# Patient Record
Sex: Female | Born: 1940
Health system: Southern US, Community
[De-identification: ages and names within clinical notes are randomized; demographics above are authoritative.]

## PROBLEM LIST (undated history)

## (undated) DIAGNOSIS — K449 Diaphragmatic hernia without obstruction or gangrene: Secondary | ICD-10-CM

## (undated) DIAGNOSIS — G459 Transient cerebral ischemic attack, unspecified: Secondary | ICD-10-CM

## (undated) DIAGNOSIS — J45909 Unspecified asthma, uncomplicated: Secondary | ICD-10-CM

## (undated) DIAGNOSIS — M8580 Other specified disorders of bone density and structure, unspecified site: Secondary | ICD-10-CM

## (undated) DIAGNOSIS — J449 Chronic obstructive pulmonary disease, unspecified: Secondary | ICD-10-CM

## (undated) DIAGNOSIS — I4891 Unspecified atrial fibrillation: Secondary | ICD-10-CM

## (undated) DIAGNOSIS — N393 Stress incontinence (female) (male): Secondary | ICD-10-CM

## (undated) DIAGNOSIS — K5790 Diverticulosis of intestine, part unspecified, without perforation or abscess without bleeding: Secondary | ICD-10-CM

## (undated) DIAGNOSIS — K2970 Gastritis, unspecified, without bleeding: Secondary | ICD-10-CM

## (undated) HISTORY — PX: TOTAL ABDOMINAL HYSTERECTOMY: SHX209

## (undated) HISTORY — DX: Diaphragmatic hernia without obstruction or gangrene: K44.9

## (undated) HISTORY — DX: Chronic obstructive pulmonary disease, unspecified: J44.9

## (undated) HISTORY — DX: Gastritis, unspecified, without bleeding: K29.70

## (undated) HISTORY — DX: Transient cerebral ischemic attack, unspecified: G45.9

## (undated) HISTORY — DX: Other specified disorders of bone density and structure, unspecified site: M85.80

## (undated) HISTORY — DX: Unspecified atrial fibrillation: I48.91

## (undated) HISTORY — DX: Diverticulosis of intestine, part unspecified, without perforation or abscess without bleeding: K57.90

## (undated) HISTORY — PX: CATARACT EXTRACTION: SUR2

## (undated) HISTORY — DX: Unspecified asthma, uncomplicated: J45.909

## (undated) HISTORY — PX: TONSILLECTOMY: SUR1361

## (undated) HISTORY — DX: Stress incontinence (female) (male): N39.3

---

## 1970-12-29 HISTORY — PX: BREAST CYST EXCISION: SHX579

## 1998-11-30 ENCOUNTER — Encounter: Payer: Self-pay | Admitting: Obstetrics and Gynecology

## 1998-11-30 ENCOUNTER — Ambulatory Visit (HOSPITAL_COMMUNITY): Admission: RE | Admit: 1998-11-30 | Discharge: 1998-11-30 | Payer: Self-pay | Admitting: Obstetrics and Gynecology

## 1998-12-13 ENCOUNTER — Ambulatory Visit (HOSPITAL_COMMUNITY): Admission: RE | Admit: 1998-12-13 | Discharge: 1998-12-13 | Payer: Self-pay | Admitting: Obstetrics and Gynecology

## 1998-12-13 ENCOUNTER — Encounter: Payer: Self-pay | Admitting: Obstetrics and Gynecology

## 1999-08-13 ENCOUNTER — Ambulatory Visit (HOSPITAL_COMMUNITY): Admission: RE | Admit: 1999-08-13 | Discharge: 1999-08-13 | Payer: Self-pay | Admitting: Obstetrics and Gynecology

## 1999-08-13 ENCOUNTER — Encounter: Payer: Self-pay | Admitting: Obstetrics and Gynecology

## 2000-06-15 ENCOUNTER — Encounter: Payer: Self-pay | Admitting: *Deleted

## 2000-06-15 ENCOUNTER — Inpatient Hospital Stay (HOSPITAL_COMMUNITY): Admission: EM | Admit: 2000-06-15 | Discharge: 2000-06-16 | Payer: Self-pay | Admitting: *Deleted

## 2000-06-16 ENCOUNTER — Ambulatory Visit (HOSPITAL_COMMUNITY): Admission: AD | Admit: 2000-06-16 | Discharge: 2000-06-16 | Payer: Self-pay | Admitting: Cardiology

## 2000-09-28 ENCOUNTER — Ambulatory Visit (HOSPITAL_COMMUNITY): Admission: RE | Admit: 2000-09-28 | Discharge: 2000-09-28 | Payer: Self-pay | Admitting: Obstetrics and Gynecology

## 2000-09-28 ENCOUNTER — Encounter: Payer: Self-pay | Admitting: Obstetrics and Gynecology

## 2001-03-10 ENCOUNTER — Encounter: Payer: Self-pay | Admitting: Gastroenterology

## 2001-03-10 ENCOUNTER — Ambulatory Visit (HOSPITAL_COMMUNITY): Admission: RE | Admit: 2001-03-10 | Discharge: 2001-03-10 | Payer: Self-pay | Admitting: Gastroenterology

## 2001-08-12 LAB — HM MAMMOGRAPHY: HM Mammogram: NORMAL

## 2002-01-03 ENCOUNTER — Encounter: Payer: Self-pay | Admitting: Obstetrics and Gynecology

## 2002-01-03 ENCOUNTER — Ambulatory Visit (HOSPITAL_COMMUNITY): Admission: RE | Admit: 2002-01-03 | Discharge: 2002-01-03 | Payer: Self-pay | Admitting: Obstetrics and Gynecology

## 2003-04-18 LAB — HM COLONOSCOPY

## 2003-08-30 HISTORY — PX: BLADDER REPAIR: SHX76

## 2003-08-31 ENCOUNTER — Encounter: Payer: Self-pay | Admitting: Obstetrics and Gynecology

## 2003-09-06 ENCOUNTER — Inpatient Hospital Stay (HOSPITAL_COMMUNITY): Admission: RE | Admit: 2003-09-06 | Discharge: 2003-09-08 | Payer: Self-pay | Admitting: Obstetrics and Gynecology

## 2003-09-06 ENCOUNTER — Encounter (INDEPENDENT_AMBULATORY_CARE_PROVIDER_SITE_OTHER): Payer: Self-pay | Admitting: Specialist

## 2003-10-17 ENCOUNTER — Ambulatory Visit (HOSPITAL_COMMUNITY): Admission: RE | Admit: 2003-10-17 | Discharge: 2003-10-17 | Payer: Self-pay | Admitting: Obstetrics and Gynecology

## 2003-10-17 ENCOUNTER — Encounter: Payer: Self-pay | Admitting: Obstetrics and Gynecology

## 2005-01-29 ENCOUNTER — Ambulatory Visit: Payer: Self-pay | Admitting: Family Medicine

## 2005-02-03 ENCOUNTER — Ambulatory Visit: Payer: Self-pay | Admitting: Family Medicine

## 2005-03-21 ENCOUNTER — Ambulatory Visit: Payer: Self-pay | Admitting: Family Medicine

## 2005-03-27 ENCOUNTER — Ambulatory Visit: Payer: Self-pay | Admitting: Family Medicine

## 2005-04-10 ENCOUNTER — Ambulatory Visit: Payer: Self-pay | Admitting: Family Medicine

## 2005-07-31 ENCOUNTER — Ambulatory Visit: Payer: Self-pay | Admitting: Family Medicine

## 2005-12-08 ENCOUNTER — Ambulatory Visit: Payer: Self-pay | Admitting: Family Medicine

## 2006-01-13 ENCOUNTER — Ambulatory Visit: Payer: Self-pay | Admitting: Family Medicine

## 2006-02-11 ENCOUNTER — Ambulatory Visit: Payer: Self-pay | Admitting: Family Medicine

## 2006-02-18 ENCOUNTER — Ambulatory Visit: Payer: Self-pay | Admitting: Family Medicine

## 2006-03-09 ENCOUNTER — Ambulatory Visit (HOSPITAL_COMMUNITY): Admission: RE | Admit: 2006-03-09 | Discharge: 2006-03-09 | Payer: Self-pay | Admitting: Obstetrics and Gynecology

## 2006-04-01 ENCOUNTER — Ambulatory Visit: Payer: Self-pay | Admitting: Gastroenterology

## 2006-04-09 ENCOUNTER — Ambulatory Visit: Payer: Self-pay | Admitting: Internal Medicine

## 2006-08-04 ENCOUNTER — Ambulatory Visit: Payer: Self-pay | Admitting: Family Medicine

## 2006-09-16 ENCOUNTER — Ambulatory Visit: Payer: Self-pay | Admitting: Family Medicine

## 2006-09-23 ENCOUNTER — Ambulatory Visit: Payer: Self-pay | Admitting: Family Medicine

## 2007-03-17 ENCOUNTER — Ambulatory Visit: Payer: Self-pay | Admitting: Family Medicine

## 2007-03-29 DIAGNOSIS — K5730 Diverticulosis of large intestine without perforation or abscess without bleeding: Secondary | ICD-10-CM | POA: Insufficient documentation

## 2007-03-29 DIAGNOSIS — J449 Chronic obstructive pulmonary disease, unspecified: Secondary | ICD-10-CM | POA: Insufficient documentation

## 2007-07-23 ENCOUNTER — Telehealth (INDEPENDENT_AMBULATORY_CARE_PROVIDER_SITE_OTHER): Payer: Self-pay | Admitting: Internal Medicine

## 2007-07-23 ENCOUNTER — Encounter: Payer: Self-pay | Admitting: Family Medicine

## 2007-07-26 ENCOUNTER — Telehealth (INDEPENDENT_AMBULATORY_CARE_PROVIDER_SITE_OTHER): Payer: Self-pay | Admitting: Internal Medicine

## 2007-07-26 ENCOUNTER — Ambulatory Visit: Payer: Self-pay | Admitting: Family Medicine

## 2007-07-26 DIAGNOSIS — M542 Cervicalgia: Secondary | ICD-10-CM | POA: Insufficient documentation

## 2007-07-26 LAB — CONVERTED CEMR LAB
Bilirubin Urine: NEGATIVE
Blood in Urine, dipstick: NEGATIVE
Glucose, Urine, Semiquant: NEGATIVE
Urobilinogen, UA: NEGATIVE

## 2007-08-02 ENCOUNTER — Ambulatory Visit: Payer: Self-pay | Admitting: Family Medicine

## 2007-08-02 DIAGNOSIS — N760 Acute vaginitis: Secondary | ICD-10-CM | POA: Insufficient documentation

## 2007-08-10 ENCOUNTER — Telehealth (INDEPENDENT_AMBULATORY_CARE_PROVIDER_SITE_OTHER): Payer: Self-pay | Admitting: *Deleted

## 2007-09-16 ENCOUNTER — Emergency Department (HOSPITAL_COMMUNITY): Admission: EM | Admit: 2007-09-16 | Discharge: 2007-09-16 | Payer: Self-pay | Admitting: Emergency Medicine

## 2007-09-17 ENCOUNTER — Inpatient Hospital Stay (HOSPITAL_COMMUNITY): Admission: AD | Admit: 2007-09-17 | Discharge: 2007-09-20 | Payer: Self-pay | Admitting: General Surgery

## 2007-09-17 ENCOUNTER — Encounter: Payer: Self-pay | Admitting: Emergency Medicine

## 2007-09-19 ENCOUNTER — Encounter (INDEPENDENT_AMBULATORY_CARE_PROVIDER_SITE_OTHER): Payer: Self-pay | Admitting: Internal Medicine

## 2007-09-29 DIAGNOSIS — M8580 Other specified disorders of bone density and structure, unspecified site: Secondary | ICD-10-CM | POA: Insufficient documentation

## 2007-10-04 ENCOUNTER — Ambulatory Visit: Payer: Self-pay | Admitting: Family Medicine

## 2007-10-04 DIAGNOSIS — S2249XA Multiple fractures of ribs, unspecified side, initial encounter for closed fracture: Secondary | ICD-10-CM | POA: Insufficient documentation

## 2007-10-06 ENCOUNTER — Telehealth (INDEPENDENT_AMBULATORY_CARE_PROVIDER_SITE_OTHER): Payer: Self-pay | Admitting: *Deleted

## 2007-10-07 ENCOUNTER — Encounter (INDEPENDENT_AMBULATORY_CARE_PROVIDER_SITE_OTHER): Payer: Self-pay | Admitting: Internal Medicine

## 2007-10-14 ENCOUNTER — Telehealth (INDEPENDENT_AMBULATORY_CARE_PROVIDER_SITE_OTHER): Payer: Self-pay | Admitting: Internal Medicine

## 2007-10-20 ENCOUNTER — Encounter (INDEPENDENT_AMBULATORY_CARE_PROVIDER_SITE_OTHER): Payer: Self-pay | Admitting: Internal Medicine

## 2007-11-01 ENCOUNTER — Telehealth (INDEPENDENT_AMBULATORY_CARE_PROVIDER_SITE_OTHER): Payer: Self-pay | Admitting: Internal Medicine

## 2008-02-02 ENCOUNTER — Ambulatory Visit: Payer: Self-pay | Admitting: Family Medicine

## 2008-02-02 DIAGNOSIS — R062 Wheezing: Secondary | ICD-10-CM | POA: Insufficient documentation

## 2008-02-10 ENCOUNTER — Telehealth (INDEPENDENT_AMBULATORY_CARE_PROVIDER_SITE_OTHER): Payer: Self-pay | Admitting: Internal Medicine

## 2008-02-10 ENCOUNTER — Ambulatory Visit: Payer: Self-pay | Admitting: Family Medicine

## 2008-02-10 ENCOUNTER — Encounter (INDEPENDENT_AMBULATORY_CARE_PROVIDER_SITE_OTHER): Payer: Self-pay | Admitting: Internal Medicine

## 2008-02-24 ENCOUNTER — Ambulatory Visit: Payer: Self-pay | Admitting: Family Medicine

## 2008-02-24 DIAGNOSIS — K219 Gastro-esophageal reflux disease without esophagitis: Secondary | ICD-10-CM | POA: Insufficient documentation

## 2008-02-25 LAB — CONVERTED CEMR LAB
Basophils Absolute: 0 10*3/uL (ref 0.0–0.1)
Basophils Relative: 0.3 % (ref 0.0–1.0)
Eosinophils Relative: 3.2 % (ref 0.0–5.0)
Hemoglobin: 12.5 g/dL (ref 12.0–15.0)
Lymphocytes Relative: 30.8 % (ref 12.0–46.0)
MCHC: 32.5 g/dL (ref 30.0–36.0)
MCV: 93.9 fL (ref 78.0–100.0)
Monocytes Absolute: 0.9 10*3/uL — ABNORMAL HIGH (ref 0.2–0.7)
Neutrophils Relative %: 56.3 % (ref 43.0–77.0)
RBC: 4.09 M/uL (ref 3.87–5.11)
WBC: 9.2 10*3/uL (ref 4.5–10.5)

## 2008-03-08 ENCOUNTER — Ambulatory Visit: Payer: Self-pay | Admitting: Family Medicine

## 2008-03-08 ENCOUNTER — Telehealth (INDEPENDENT_AMBULATORY_CARE_PROVIDER_SITE_OTHER): Payer: Self-pay | Admitting: Internal Medicine

## 2008-03-08 DIAGNOSIS — N390 Urinary tract infection, site not specified: Secondary | ICD-10-CM | POA: Insufficient documentation

## 2008-03-08 LAB — CONVERTED CEMR LAB
Glucose, Urine, Semiquant: NEGATIVE
pH: 6.5

## 2008-03-09 ENCOUNTER — Encounter (INDEPENDENT_AMBULATORY_CARE_PROVIDER_SITE_OTHER): Payer: Self-pay | Admitting: Internal Medicine

## 2008-03-10 ENCOUNTER — Telehealth: Payer: Self-pay | Admitting: Family Medicine

## 2008-03-21 ENCOUNTER — Ambulatory Visit: Payer: Self-pay | Admitting: Family Medicine

## 2008-04-14 ENCOUNTER — Ambulatory Visit: Payer: Self-pay | Admitting: Family Medicine

## 2008-04-14 DIAGNOSIS — J019 Acute sinusitis, unspecified: Secondary | ICD-10-CM | POA: Insufficient documentation

## 2008-05-30 ENCOUNTER — Ambulatory Visit: Payer: Self-pay | Admitting: Family Medicine

## 2008-05-30 DIAGNOSIS — K2970 Gastritis, unspecified, without bleeding: Secondary | ICD-10-CM | POA: Insufficient documentation

## 2008-05-30 DIAGNOSIS — K2990 Gastroduodenitis, unspecified, without bleeding: Secondary | ICD-10-CM

## 2008-10-02 ENCOUNTER — Telehealth (INDEPENDENT_AMBULATORY_CARE_PROVIDER_SITE_OTHER): Payer: Self-pay | Admitting: *Deleted

## 2008-10-11 ENCOUNTER — Ambulatory Visit: Payer: Self-pay | Admitting: Family Medicine

## 2008-10-11 DIAGNOSIS — N393 Stress incontinence (female) (male): Secondary | ICD-10-CM | POA: Insufficient documentation

## 2008-10-11 LAB — CONVERTED CEMR LAB
Bacteria, UA: 0
Nitrite: NEGATIVE
Specific Gravity, Urine: 1.01
Urobilinogen, UA: 0.2
WBC Urine, dipstick: NEGATIVE
pH: 7

## 2008-10-16 ENCOUNTER — Telehealth (INDEPENDENT_AMBULATORY_CARE_PROVIDER_SITE_OTHER): Payer: Self-pay | Admitting: Internal Medicine

## 2008-10-17 ENCOUNTER — Ambulatory Visit: Payer: Self-pay | Admitting: Family Medicine

## 2008-12-19 ENCOUNTER — Ambulatory Visit: Payer: Self-pay | Admitting: Family Medicine

## 2008-12-28 ENCOUNTER — Ambulatory Visit: Payer: Self-pay | Admitting: Family Medicine

## 2008-12-28 DIAGNOSIS — R42 Dizziness and giddiness: Secondary | ICD-10-CM | POA: Insufficient documentation

## 2009-01-26 ENCOUNTER — Ambulatory Visit: Payer: Self-pay | Admitting: Family Medicine

## 2009-01-30 ENCOUNTER — Ambulatory Visit: Payer: Self-pay | Admitting: Family Medicine

## 2009-03-06 ENCOUNTER — Ambulatory Visit: Payer: Self-pay | Admitting: Family Medicine

## 2009-05-01 ENCOUNTER — Ambulatory Visit: Payer: Self-pay | Admitting: Family Medicine

## 2009-05-04 ENCOUNTER — Ambulatory Visit: Payer: Self-pay | Admitting: Family Medicine

## 2009-05-31 ENCOUNTER — Encounter (INDEPENDENT_AMBULATORY_CARE_PROVIDER_SITE_OTHER): Payer: Self-pay | Admitting: Internal Medicine

## 2009-05-31 ENCOUNTER — Ambulatory Visit: Payer: Self-pay | Admitting: Family Medicine

## 2009-05-31 LAB — CONVERTED CEMR LAB: ALT: 16 units/L (ref 0–35)

## 2009-06-01 LAB — CONVERTED CEMR LAB
AST: 26 units/L (ref 0–37)
BUN: 18 mg/dL (ref 6–23)
Calcium: 9.1 mg/dL (ref 8.4–10.5)
Cholesterol: 200 mg/dL (ref 0–200)
Creatinine, Ser: 0.8 mg/dL (ref 0.4–1.2)
GFR calc non Af Amer: 75.63 mL/min (ref >60)
Glucose, Bld: 106 mg/dL — ABNORMAL HIGH (ref 70–99)
Potassium: 4.1 meq/L (ref 3.5–5.1)
TSH: 0.72 microintl units/mL (ref 0.35–5.50)
VLDL: 31 mg/dL (ref 0.0–40.0)

## 2009-06-21 ENCOUNTER — Ambulatory Visit: Payer: Self-pay | Admitting: Family Medicine

## 2009-07-13 ENCOUNTER — Telehealth (INDEPENDENT_AMBULATORY_CARE_PROVIDER_SITE_OTHER): Payer: Self-pay | Admitting: Internal Medicine

## 2009-07-30 ENCOUNTER — Telehealth: Payer: Self-pay | Admitting: Family Medicine

## 2009-08-23 ENCOUNTER — Ambulatory Visit: Payer: Self-pay | Admitting: Family Medicine

## 2009-08-23 DIAGNOSIS — H6120 Impacted cerumen, unspecified ear: Secondary | ICD-10-CM | POA: Insufficient documentation

## 2009-08-24 ENCOUNTER — Telehealth (INDEPENDENT_AMBULATORY_CARE_PROVIDER_SITE_OTHER): Payer: Self-pay | Admitting: Internal Medicine

## 2009-09-24 ENCOUNTER — Telehealth: Payer: Self-pay | Admitting: Family Medicine

## 2009-10-15 ENCOUNTER — Ambulatory Visit: Payer: Self-pay | Admitting: Family Medicine

## 2009-10-15 DIAGNOSIS — J209 Acute bronchitis, unspecified: Secondary | ICD-10-CM | POA: Insufficient documentation

## 2009-12-03 ENCOUNTER — Telehealth: Payer: Self-pay | Admitting: Family Medicine

## 2009-12-03 ENCOUNTER — Ambulatory Visit: Payer: Self-pay | Admitting: Family Medicine

## 2009-12-03 DIAGNOSIS — R7309 Other abnormal glucose: Secondary | ICD-10-CM | POA: Insufficient documentation

## 2009-12-04 LAB — CONVERTED CEMR LAB: Glucose, Bld: 99 mg/dL (ref 70–99)

## 2010-01-21 ENCOUNTER — Telehealth: Payer: Self-pay | Admitting: Family Medicine

## 2010-05-22 ENCOUNTER — Encounter: Payer: Self-pay | Admitting: Family Medicine

## 2010-05-22 LAB — CONVERTED CEMR LAB
ALT: 19 units/L
BUN: 17 mg/dL
Creatinine, Ser: 0.65 mg/dL
Ferritin: 8 ng/mL
Glucose, Bld: 109 mg/dL
Hemoglobin: 11 g/dL
Hgb A1c MFr Bld: 6.2 %
MCHC: 31.9 g/dL
RBC: 4.09 M/uL
RDW: 14.3 %
TIBC: 361 ug/dL
TSH: 0.681 microintl units/mL
Triglycerides: 74 mg/dL
UIBC: 290 ug/dL
Vit D, 25-Hydroxy: 30.7 ng/mL
Vitamin B-12: 287 pg/mL

## 2011-01-30 NOTE — Progress Notes (Addendum)
Summary: advair  Phone Note Refill Request Message from:  Fax from Pharmacy on January 21, 2010 9:18 AM  Refills Requested: Medication #1:  advair diskus 250/50   Supply Requested: 1 month [facility] [phone] not on medication list   Method Requested: Electronic Initial call taken by: Benny Lennert CMA (AAMA),  January 21, 2010 9:18 AM    New/Updated Medications: ADVAIR DISKUS 100-50 MCG/DOSE AEPB (FLUTICASONE-SALMETEROL) one inhal two times a day Prescriptions: ADVAIR DISKUS 100-50 MCG/DOSE AEPB (FLUTICASONE-SALMETEROL) one inhal two times a day  #1 MDI x 1   Entered and Authorized by:   Shaune Leeks MD   Signed by:   Shaune Leeks MD on 01/21/2010   Method used:   Electronically to        [facility]* (retail)       [address]       [place], [place]  [address]       Ph: [phone]       Fax: [phone]   RxID:   [id number]

## 2011-02-14 ENCOUNTER — Ambulatory Visit (INDEPENDENT_AMBULATORY_CARE_PROVIDER_SITE_OTHER): Payer: Medicare Other | Admitting: Family Medicine

## 2011-02-14 ENCOUNTER — Encounter: Payer: Self-pay | Admitting: Family Medicine

## 2011-02-14 DIAGNOSIS — R059 Cough, unspecified: Secondary | ICD-10-CM

## 2011-02-14 DIAGNOSIS — R05 Cough: Secondary | ICD-10-CM

## 2011-02-14 DIAGNOSIS — R739 Hyperglycemia, unspecified: Secondary | ICD-10-CM | POA: Insufficient documentation

## 2011-02-17 ENCOUNTER — Encounter: Payer: Self-pay | Admitting: Family Medicine

## 2011-02-19 NOTE — Assessment & Plan Note (Signed)
Summary: COLD/CLE   BCBS   Vitals Entered By: Delilah Shan CMA Valen Gillison Dull) (February 14, 2011 9:46 AM)

## 2011-02-19 NOTE — Assessment & Plan Note (Addendum)
Summary: Cold   Vital Signs:  Patient profile:   70 year old female Height:      64 inches Weight:      140.25 pounds BMI:     24.16 Temp:     97.6 degrees F oral Pulse rate:   84 / minute Pulse rhythm:   regular BP sitting:   118 / 80  (left arm) Cuff size:   regular  Vitals Entered By: Delilah Shan CMA Duncan Dull) (February 14, 2011 9:51 AM) CC: Cold   History of Present Illness: Cold symptoms.  Going on for a few days.  Head congestion.  some cough.  H/o COPD.  Cough is increased.  On advair and xopenex for breakthrough.  Has needed SABA more this week.  No FCNAV.  Some tightness in chest with cough.  Occ sputum after inhaler use, some yellow sputum.  Wheezing more than usual over the last few days.   Allergies: No Known Drug Allergies  Past History:  Past Medical History: Pap/Gyn care per Dr. Elana Alm Diabetes mellitus, type II  Social History: Marital Status: divorced Children: 2childresn, 1 grandchild Occupation: prev worked at [employer] quit smoking 20+ years  Review of Systems       See HPI.  Otherwise negative.    Physical Exam  General:  no apparent distress normocephalic atraumatic mucous membranes moist tm wnl nasal epithelium injected.  op w/o erythema neck supple no la regular rate and rhythm occ scattered ronchi but no wheeze, no focal decrease in bs, no increase in wob ext well perfused.    Impression & Recommendations:  Problem # 1:  COUGH (ICD-786.2) start zmax and continue inhalers.  I didn't start steroids given h/o DM2 and lack of wheeze.  If short of breath or progressive symptoms, the she needs to go to ER.  Nontoxic and okay for outpatient follow up.  she agrees.   Orders: Prescription Created Electronically [phone])  Complete Medication List: 1)  Nexium 40 Mg Cpdr (Esomeprazole magnesium) .... Take 2  tablet by mouth once a day 2)  Mobic 15 Mg Tabs (Meloxicam) .... Take one by mouth one time a day 3)  Xopenex Hfa 45 Mcg/act  Aero (Levalbuterol tartrate) .... 2 puffs every 4 hours as needed 4)  Advair Diskus 250-50 Mcg/dose Aepb (Fluticasone-salmeterol) .... One inhalation two times a day 5)  Zithromax 250 Mg Tabs (Azithromycin) .... 2 by mouth today and then 1 by mouth once daily for 4 days. 6)  Tramadol Hcl 50 Mg Tabs (Tramadol hcl) .... Take 1 tablet every 4 to 6 hours as needed. 7)  Metformin Hcl 500 Mg Tabs (Metformin hcl) .... Take 1 tablet by mouth two times a day 8)  Metoprolol Succinate 25 Mg Xr24h-tab (Metoprolol succinate) .... Take 1 tablet by mouth once a day 9)  Vitamin D 1000 Unit Tabs (Cholecalciferol) .... Take 1 tablet by mouth two times a day  Patient Instructions: 1)  Start the antibiotics today and keep using your inhalers.  If you get more short of breath, then go to the ER.  2)  OV in 1-2 months with A1c before OV.  250.00 3)  Take care.  Prescriptions: ZITHROMAX 250 MG TABS (AZITHROMYCIN) 2 by mouth today and then 1 by mouth once daily for 4 days.  #6 x 0   Entered and Authorized by:   Crawford Givens MD   Signed by:   Crawford Givens MD on 02/14/2011   Method used:   Electronically to  [facility]* (retail)       [address]       [place], [place]  [address]       Ph: [phone]       Fax: [phone]   RxID:   [id number]    Orders Added: 1)  Est. Patient Level III [84132] 2)  Prescription Created Electronically [phone]    Current Allergies (reviewed today): No known allergies

## 2011-02-20 ENCOUNTER — Telehealth: Payer: Self-pay | Admitting: Family Medicine

## 2011-02-25 ENCOUNTER — Encounter: Payer: Self-pay | Admitting: Family Medicine

## 2011-02-25 NOTE — Progress Notes (Addendum)
Summary: still has cough, congestion  Phone Note Call from Dominique Clark   Caller: Dominique Clark Call For: Dr. Para March Summary of Call: Pt was seen on 2/17.  She finished z-pack but still has a lot of coughing and tightness in her chest.  She says she usually requires 2 rounds of antibiotics before she is well.  No fever, but says she never runs a fever.  Drinking plenty of fluids, taking mucinix twice a day.  [facility]. Please let her know if something is called in. Initial call taken by: Lowella Petties CMA, AAMA,  February 20, 2011 3:06 PM  Follow-up for Phone Call        I sent in the refill but have the Dominique Clark hold off on taking it for 2-3 more days to see if she improved. If not, then take it.  It is likely that she still has some of the zmax working, since it stays in the body for several days.  She may improve on her own.  Follow-up by: Crawford Givens MD,  February 20, 2011 3:21 PM  Additional Follow-up for Phone Call Additional follow up Details #1::        Dominique Clark Advised.  Additional Follow-up by: Delilah Shan CMA Kester Stimpson Dull),  February 20, 2011 3:31 PM    Prescriptions: ZITHROMAX 250 MG TABS (AZITHROMYCIN) 2 by mouth today and then 1 by mouth once daily for 4 days.  #6 x 0   Entered and Authorized by:   Crawford Givens MD   Signed by:   Crawford Givens MD on 02/20/2011   Method used:   Electronically to        [facility]* (retail)       [address]       [place], [place]  [address]       Ph: [phone]       Fax: [phone]   RxID:   [id number]

## 2011-02-26 ENCOUNTER — Encounter: Payer: Self-pay | Admitting: Family Medicine

## 2011-03-06 NOTE — Miscellaneous (Signed)
  Clinical Lists Changes  Observations: Added new observation of VIT D 25-OH: 30.7 ng/mL (05/22/2010 8:15) Added new observation of PLATELETK/UL: 369 K/uL (05/22/2010 8:15) Added new observation of RDW: 14.3 % (05/22/2010 8:15) Added new observation of MCHC RBC: 31.9 g/dL (24/40/1027 2:53) Added new observation of MCV: 84 fL (05/22/2010 8:15) Added new observation of HCT: 34.5 % (05/22/2010 8:15) Added new observation of HGB: 11.0 g/dL (66/44/0347 4:25) Added new observation of RBC M/UL: 4.09 M/uL (05/22/2010 8:15) Added new observation of WBC COUNT: 6.9 10*3/microliter (05/22/2010 8:15) Added new observation of TSH: 0.681 microintl units/mL (05/22/2010 8:15) Added new observation of HGBA1C: 6.2 % (05/22/2010 8:15) Added new observation of FERRITIN: 8 ng/mL (05/22/2010 8:15) Added new observation of IRON SATUR %: 20 % (05/22/2010 8:15) Added new observation of TIBC: 361 mcg/dL (95/63/8756 4:33) Added new observation of UIBC: 290 mcg/dL (29/51/8841 6:60) Added new observation of IRON: 71 mcg/dL (63/12/6008 9:32) Added new observation of FOLATE: >19.9 ng/mL (05/22/2010 8:15) Added new observation of B12: 287 pg/mL (05/22/2010 8:15) Added new observation of SGPT (ALT): 19 units/L (05/22/2010 8:15) Added new observation of SGOT (AST): 26 units/L (05/22/2010 8:15) Added new observation of CREATININE: 0.65 mg/dL (35/57/3220 2:54) Added new observation of BUN: 17 mg/dL (27/05/2375 2:83) Added new observation of BG RANDOM: 109 mg/dL (15/17/6160 7:37) Added new observation of LDL: 102 mg/dL (10/62/6948 5:46) Added new observation of HDL: 56 mg/dL (27/02/5008 3:81) Added new observation of TRIGLYC TOT: 74 mg/dL (82/99/3716 9:67) Added new observation of CHOLESTEROL: 173 mg/dL (89/38/1017 5:10)

## 2011-03-11 NOTE — Letter (Signed)
Summary: [facility]   Imported By: Kassie Mends 03/03/2011 11:27:50  _____________________________________________________________________  External Attachment:    Type:   Image     Comment:   External Document

## 2011-04-09 ENCOUNTER — Other Ambulatory Visit: Payer: Self-pay | Admitting: Family Medicine

## 2011-04-09 DIAGNOSIS — D649 Anemia, unspecified: Secondary | ICD-10-CM

## 2011-04-14 ENCOUNTER — Other Ambulatory Visit (INDEPENDENT_AMBULATORY_CARE_PROVIDER_SITE_OTHER): Payer: Medicare Other | Admitting: Family Medicine

## 2011-04-14 DIAGNOSIS — E119 Type 2 diabetes mellitus without complications: Secondary | ICD-10-CM

## 2011-04-14 DIAGNOSIS — D649 Anemia, unspecified: Secondary | ICD-10-CM

## 2011-04-14 LAB — CBC WITH DIFFERENTIAL/PLATELET
Basophils Relative: 0.6 % (ref 0.0–3.0)
Eosinophils Relative: 3.2 % (ref 0.0–5.0)
Monocytes Relative: 8.3 % (ref 3.0–12.0)
Neutrophils Relative %: 58 % (ref 43.0–77.0)
Platelets: 224 10*3/uL (ref 150.0–400.0)
RBC: 3.87 Mil/uL (ref 3.87–5.11)
WBC: 8.4 10*3/uL (ref 4.5–10.5)

## 2011-04-14 LAB — HEMOGLOBIN A1C: Hgb A1c MFr Bld: 5.9 % (ref 4.6–6.5)

## 2011-04-14 LAB — FERRITIN: Ferritin: 10.5 ng/mL (ref 10.0–291.0)

## 2011-04-16 ENCOUNTER — Ambulatory Visit: Payer: Medicare Other | Admitting: Family Medicine

## 2011-04-17 ENCOUNTER — Encounter: Payer: Self-pay | Admitting: Family Medicine

## 2011-04-17 ENCOUNTER — Ambulatory Visit (INDEPENDENT_AMBULATORY_CARE_PROVIDER_SITE_OTHER): Payer: Medicare Other | Admitting: Family Medicine

## 2011-04-17 DIAGNOSIS — D649 Anemia, unspecified: Secondary | ICD-10-CM

## 2011-04-17 DIAGNOSIS — M949 Disorder of cartilage, unspecified: Secondary | ICD-10-CM

## 2011-04-17 DIAGNOSIS — J449 Chronic obstructive pulmonary disease, unspecified: Secondary | ICD-10-CM

## 2011-04-17 DIAGNOSIS — E119 Type 2 diabetes mellitus without complications: Secondary | ICD-10-CM

## 2011-04-17 DIAGNOSIS — M8580 Other specified disorders of bone density and structure, unspecified site: Secondary | ICD-10-CM

## 2011-04-17 DIAGNOSIS — M899 Disorder of bone, unspecified: Secondary | ICD-10-CM

## 2011-04-17 DIAGNOSIS — J4489 Other specified chronic obstructive pulmonary disease: Secondary | ICD-10-CM

## 2011-04-17 MED ORDER — AZITHROMYCIN 250 MG PO TABS: ORAL_TABLET | ORAL | Status: AC

## 2011-04-17 NOTE — Progress Notes (Signed)
1 week of cough with sputum.  She is getting some better, slowly.  She is not on mucinex now. It had helped before.  Advair bid, using xopenex more often in the last week.  Some wheeze.  No fevers.    H/o mild anemia.  Resolved now. Prev with negative colonoscopy. Will check IFOB.  D/w pt CZ:YSAYT CA screening and she declines colonoscopy referral.   Diabetes:  Using medications without difficulties:yes Hypoglycemic episodes:no Hyperglycemic episodes:no Feet problems:no Blood Sugars averaging:~100  PMH and SH reviewed  Meds, vitals, and allergies reviewed.   ROS: See HPI.  Otherwise negative.    GEN: nad, alert and oriented HEENT: mucous membranes moist NECK: supple w/o LA CV: rrr. PULM: dry cough but no inc wob noted.  scatterred ronchi bilaterally ABD: soft, +bs EXT: no edema SKIN: no acute rash  Diabetic foot exam: Normal inspection except for prominent veins No skin breakdown No calluses  Normal DP pulses Normal sensation to light tough and monofilament Nails thickened

## 2011-04-17 NOTE — Patient Instructions (Signed)
Start the antibiotics today and keep using the inhalers and mucinex.  Come back for fasting labs in 6 months with a physical a few days later.  Return the stool kit in the meantime.  Take care.

## 2011-04-17 NOTE — Assessment & Plan Note (Addendum)
No change in meds.  Check in 6 months. D/w pt ZO:XWRU, meds, weight, exercise.

## 2011-04-17 NOTE — Assessment & Plan Note (Addendum)
Exacerbation,  Start abx, use inhalers, and fu prn. Nontoxic and okay for outpatient fu.

## 2011-04-17 NOTE — Assessment & Plan Note (Addendum)
resolved, check IFOB and recheck cbc in 6 months.  Prev with negative colonoscopy and prev EGD done.

## 2011-04-22 ENCOUNTER — Other Ambulatory Visit (INDEPENDENT_AMBULATORY_CARE_PROVIDER_SITE_OTHER): Payer: Medicare Other | Admitting: Family Medicine

## 2011-04-22 ENCOUNTER — Other Ambulatory Visit: Payer: Medicare Other

## 2011-04-22 DIAGNOSIS — Z1211 Encounter for screening for malignant neoplasm of colon: Secondary | ICD-10-CM

## 2011-04-22 LAB — FECAL OCCULT BLOOD, IMMUNOCHEMICAL: Fecal Occult Bld: NEGATIVE

## 2011-04-23 ENCOUNTER — Encounter: Payer: Self-pay | Admitting: *Deleted

## 2011-05-02 ENCOUNTER — Ambulatory Visit (INDEPENDENT_AMBULATORY_CARE_PROVIDER_SITE_OTHER): Payer: Medicare Other | Admitting: Family Medicine

## 2011-05-02 ENCOUNTER — Encounter: Payer: Self-pay | Admitting: Family Medicine

## 2011-05-02 VITALS — BP 128/70 | HR 92 | Temp 98.1°F | Wt 142.0 lb

## 2011-05-02 DIAGNOSIS — H8110 Benign paroxysmal vertigo, unspecified ear: Secondary | ICD-10-CM

## 2011-05-02 MED ORDER — MECLIZINE HCL 25 MG PO TABS: 12.5000 mg | ORAL_TABLET | Freq: Three times a day (TID) | ORAL | Status: DC | PRN

## 2011-05-02 NOTE — Assessment & Plan Note (Addendum)
Use prn meclizine and fu prn.  Benign exam o/w.  D/w pt.  Given her hx, exam, no indication for further wu. She understood.

## 2011-05-02 NOTE — Patient Instructions (Signed)
Take the meclizine 3 times a day as needed and let me know if you have more symptoms.  Take care.

## 2011-05-02 NOTE — Progress Notes (Signed)
"  Inner ear."  The "bed was moving this AM" when she got out of bed.  The room was spinning as she was trying to sit up.  Was nauseated and sweaty.  She has had it before but not recently.  This episode lasted about seconds to minutes.  It self resolved and she went back to bed.  She rolled over and it happened again.  She had used meclizine before with some relief.  She feels better now.    Meds, vitals, and allergies reviewed.   ROS: See HPI.  Otherwise, noncontributory.  GEN: nad, alert and oriented HEENT: mucous membranes moist NECK: supple w/o LA CV: rrr.  PULM: ctab, no inc wob ABD: soft, +bs EXT: no edema SKIN: no acute rash CN 2-12 wnl B, S/S/DTR wnl x4 DHP positive.

## 2011-05-04 ENCOUNTER — Encounter: Payer: Self-pay | Admitting: Family Medicine

## 2011-05-13 NOTE — H&P (Signed)
[name], [name]                ACCOUNT NO.:  [id number]   MEDICAL RECORD NO.:  [id number]          PATIENT TYPE:  INP   LOCATION:  5733                         FACILITY:  [facility]   PHYSICIAN:  Thornton Park. Daphine Deutscher, MD  DATE OF BIRTH:  10-Apr-1941   DATE OF ADMISSION:  09/17/2007  DATE OF DISCHARGE:                              HISTORY & PHYSICAL   CHIEF COMPLAINT:  Dominique Clark on the right side by breaking multiple ribs, with  underlying COPD.   HISTORY:  Dominique Clark is an active 70 year old white female who was  referred over by [facility] D. Bean, FNP at [facility] this  afternoon, after she was seen in the [facility] emergency room last  night following a fall.  Apparently she was in coded into [facility] ER  as a trauma patient, but was diverted to [facility].  She was seen by  Dr. Ethelda Chick, who recommended admission; but the patient did not want  to be admitted to [facility].  She comes in today having  difficulty controlling her pain with Percocet.   ALLERGIES:  THE PATIENT HAS NO KNOWN ALLERGIES.   PAST MEDICAL HISTORY:  1. She has COPD.  2. Hiatal hernia, with occasional gastroesophageal reflux disease.   CURRENT MEDICATIONS:  1. Nexium 40 mg daily.  2. Estrace 1 mg per day.  3. Mobic 25 mg one a day.  4. Percocet at home.   PRIOR SURGERY:  1. Some form of vaginal urethral sling done by Drs. McPhail and      Dahlstead in 2004.  2. Prior hysterectomy.   She currently has some right shoulder bursitis.   SOCIAL HISTORY:  Smoking history.  She smoked for about 20 years, but  quit about 15 years ago.   REVIEW OF SYSTEMS:  Negative for any kind of angina or cardiac problems;  and she said she has had a cardiac catheterization which was negative.   PHYSICAL EXAM:  An older white female whose splinting and is very  uncomfortable.  Her pulse rate was about 68.  HEENT:  Head normocephalic.  Eyes: Sclerae nonicteric.  Pupils equal,  round and react to light.   Nose and Throat: Exam unremarkable.  NECK:  Supple.  CHEST:  She has some diminished breath sounds terms diminished  respiratory excursions on the right, but breath sounds bilaterally.  She  is tender underneath her right breast, but no bruising is noted there.  ABDOMEN:  Nontender.  EXTREMITIES:  Have full range of motion, except some soreness in her  right shoulder.   RADIOLOGY:  X-rays show minimally displaced right rib fractures, without  obvious complication.  There was some chronic COPD.   IMPRESSION:  Fall to the right side, with no loss of consciousness; but  with rib fractures in an elderly patient with underlying COPD.   PLAN:  Admit for pain control.  Repeat x-rays.      Thornton Park Daphine Deutscher, MD  Electronically Signed     MBM/MEDQ  D:  09/17/2007  T:  09/18/2007  Job:  [id number]

## 2011-05-13 NOTE — Discharge Summary (Signed)
NAMEELLICIA, Dominique Clark                ACCOUNT NO.:  [id number]   MEDICAL RECORD NO.:  [id number]          PATIENT TYPE:  INP   LOCATION:  5733                         FACILITY:  [facility]   PHYSICIAN:  Gabrielle Dare. Janee Morn, M.D.DATE OF BIRTH:  1941/10/09   DATE OF ADMISSION:  09/17/2007  DATE OF DISCHARGE:  09/19/2007                               DISCHARGE SUMMARY   PRIMARY CARE Bani Gianfrancesco:  Adult nurse at [facility].   DISCHARGE DIAGNOSES:  1. Status post fall at home.  2. Right rib fractures. without pneumothorax.  3. Chronic obstructive pulmonary disease.  4. Exacerbation of chronic bronchitis symptoms.  5. Right shoulder bursitis.  6. Hiatal hernia.  7. Gastroesophageal reflux disease.   BRIEF HISTORY ON ADMISSION:  This is a 70 year old white female who was  referred to the trauma service after suffering a fall at home a couple  of days prior to her presentation to her primary care [name].  She was  seen in the [facility] Emergency Room, and workup at this time showed  several minimally displaced right rib fractures, #3, 4, 5, and 6,  without pneumothorax.  The patient had a significant history of  underlying COPD as well, and was having a great deal of difficulty with  pain control immobilization, and was admitted to the trauma service at  [facility].   HOSPITAL COURSE:  The patient has done well with mobilization.  She has  had some nausea and vomiting felt to be related to her pain medications,  and this is improved on the morning of discharge.  She did have a sputum  culture obtained, and this culture is pending at the time of this  dictation.  However, she was started on p.o. antibiotics for empiric  coverage.  At this time, it is felt she can be discharged home with her  son.  She is tolerating a regular diet.   MEDICATIONS AT THE TIME OF DISCHARGE:  1. Percocet 5/325, 1-2 p.o. q.4 h. p.r.n. pain, #60, no refill.  2. Augmentin 500 mg t.i.d. x5 days, to start on September 20, 2007.      She was treated with 2 days of Avelox while hospitalized, but this      was changed due to cost concerns.  3. Robitussin 2 tsp q.i.d. x5 days.  4. Spiriva 1 puff daily.  5. Xopenex 2 puffs q.i.d.  6. Estrace 1 mg daily.  7. Mobic 15 mg daily.  8. Nexium 40 mg daily.  9. Quinine 1 tablet daily.   She can follow up with Section at [facility] in 1 week, or sooner  should she have any difficulties in the interim, or follow up with  trauma services should she so desire.      Shawn Rayburn, P.A.      Gabrielle Dare Janee Morn, M.D.  Electronically Signed    SR/MEDQ  D:  09/19/2007  T:  09/20/2007  Job:  [id number]   cc:   Corinda Gubler at [facility]

## 2011-05-16 NOTE — H&P (Signed)
NAME:  Dominique Clark, DUPLECHAIN                          ACCOUNT NO.:  [id number]   MEDICAL RECORD NO.:  [id number]                   PATIENT TYPE:  INP   LOCATION:  NA                                   [facility]ACILITY[facility]   PHYSICIAN:  Katherine Roan, M.D.               DATE OF BIRTH:  [birth year]   DATE OF ADMISSION:  DATE OF DISCHARGE:                                HISTORY & PHYSICAL   CHIEF COMPLAINT:  Pelvic pressure and feeling that things are falling out.   HISTORY OF PRESENT ILLNESS:  Ms. House is a 70 year old gravida 1, para 1  female with twins who complains of her bladder falling down, pelvic  pressure, and feeling that things are falling out.  On examination the  patient has large rectocele and enterocele and probable cystocele as well.  The patient has minimal incontinence but we have suggested she have a sling  when she has her pelvic reconstruction.  Her Pap smears are normal.   CURRENT MEDICATIONS:  1. Nexium.  2. Clarinex.  3. Estrace 1 mg.  4. Mobic.  5. Advair.  6. Aspirin.   PAST MEDICAL HISTORY:  1. She has had a past history of breast cyst.  2. She has had some form of bladder surgery.  3. She had a vaginal hysterectomy in 1978.  4. Laparoscopy BSO in 1992.  Not quite sure she had laparoscopic BSO.  At     the time we thought she had a pelvic mass but it turned out that she had     follicular cysts on the ovary with a hydrosalpinx.   REVIEW OF SYSTEMS:  HEENT:  She does not wear glasses, but has noted no  decrease in visual or auditory acuity.  HEART:  No hypertension.  No chest  pain.  No shortness of breath.  No history of heart murmur.  LUNGS:  No  chronic cough.  She has asthma which is treated with Advair.  GENITOURINARY:  She has no stress incontinence or at least minimal stress incontinence.  She  complains of pelvic pressure.  GASTROINTESTINAL:  She has no bowel habit  change, no melena.  She has been treated for a hiatal hernia with her  Nexium.   SOCIAL HISTORY:  She works at [employer].  She does not smoke or drink.   FAMILY HISTORY:  Her mother is deceased at age 40 from congestive heart  failure.  Her father died at age 66.  She has four brothers living and well  and one sister who is deceased.  This sister died at birth.  She has a  maternal aunt with diabetes.   PHYSICAL EXAMINATION:  GENERAL:  Well-developed, pleasant female who is 70  years of age.  Appears to be her stated age.  Is oriented to time, place,  and recent events.  HEENT:  Unremarkable.  The oropharynx is not injected.  NECK:  Supple.  Carotid pulses are equal.  I hear no bruits.  LUNGS:  Clear to P&A.  BREASTS:  No masses or tenderness.  HEART:  Normal sinus rhythm.  No murmurs.  ABDOMEN:  Soft and flat.  Liver, spleen, and kidneys are not palpated.  Bowel sounds are normal.  No bruits heard.  PELVIC:  Prolapse of the anterior segment as well as an enterocele which  protrudes at the introitus with coughing.  She also has a moderate amount of  posterior relaxation.  No pelvic masses are noted.  RECTAL:  Hemoccult is negative.  EXTREMITIES:  Good range of motion.  Equal pulses and reflexes.   IMPRESSION:  Genital prolapse with cystocele, rectocele, and enterocele.   PLAN:  Pelvic reconstruction.  Failure rates and risks have been discussed  with patient.                                               Katherine Roan, M.D.    SDM/MEDQ  D:  09/01/2003  T:  09/01/2003  Job:  [id number]

## 2011-05-16 NOTE — Op Note (Signed)
   NAME:  Dominique Clark, Dominique Clark                          ACCOUNT NO.:  [id number]   MEDICAL RECORD NO.:  [id number]                   PATIENT TYPE:  INP   LOCATION:  A540                                 [facility]ACILITY:  [facility]   PHYSICIAN:  Katherine Roan, M.D.               DATE OF BIRTH:  1941-02-03   DATE OF PROCEDURE:  DATE OF DISCHARGE:                                 OPERATIVE REPORT   PREOPERATIVE DIAGNOSIS:  Genital prolapse.   POSTOPERATIVE DIAGNOSIS:  Genital prolapse.   OPERATION:  Pelvic reconstruction, anterior repair, enterocele repair,  posterior repair and SPARC sling.   DESCRIPTION OF PROCEDURE:  The patient was placed in lithotomy position,  prepped and draped in the usual fashion. She was examined prior to prep and  drape, found to have no pelvic masses. The rectum exam was normal. It was  not as stenotic as I thought. There was moderate relaxation throughout.  Prepped and draped in the usual fashion, Foley catheter was inserted. The  apex of the vagina was grasped and at that point, the enterocele sac was  dissected, the pelvic cavity was entered. The enterocele was then repaired  using interrupted sutures of 3-0 and 4-0 Vicryl and also a pursestring  suture of 3-0 Ethibond. We then approached the cystocele and dissected it  from the underlying vaginal mucosa, plicated the pubocervical vaginal fascia  in the midline. Dr. Retta Diones then did a SPARC sling. We went in and  dissected, excised the excessive vaginal tissue, closed it with interrupted  sutures of 3-0 Vicryl and a locking suture of 3-0 chromic. Posteriorly we  dissected the rectocele and then plicated the prerectal fascia in the  midline with interrupted sutures of 3 and 4-0 Vicryl. The vagina was then  closed with a running locking suture of 3-0 chromic and interrupted sutures  of 3-0 Vicryl. The bulbocavernosus muscle and transverse perinei muscle were  then plicated in the midline with interrupted sutures of 3 and  4-0 Vicryl.  Perineal skin was closed with a subcuticular 3-0 chromic. The Foley was  draining clear urine. The patient tolerated the procedure well and was sent  to the recovery room in good condition.                                               Katherine Roan, M.D.    SDM/MEDQ  D:  09/06/2003  T:  09/07/2003  Job:  [id number]   cc:   Bertram Millard. Dahlstedt, M.D.  [address]  [place]  [place] [address]  Fax: [phone]

## 2011-05-16 NOTE — Op Note (Signed)
NAME:  Dominique Clark, Dominique Clark                          ACCOUNT NO.:  [id number]   MEDICAL RECORD NO.:  [id number]                   PATIENT TYPE:  INP   LOCATION:  0477                                 FACILITY:  [facility]   PHYSICIAN:  Bertram Millard. Dahlstedt, M.D.          DATE OF BIRTH:  02-Jan-1941   DATE OF PROCEDURE:  09/06/2003  DATE OF DISCHARGE:                                 OPERATIVE REPORT   PREOPERATIVE DIAGNOSIS:  Stress urinary incontinence.   POSTOPERATIVE DIAGNOSIS:  Stress urinary incontinence.   PRINCIPAL PROCEDURE:  SPARC procedure.   SURGEON:  Bertram Millard. Dahlstedt, M.D.   ANESTHESIA:  General endotracheal.   COMPLICATIONS:  None.   ESTIMATED BLOOD LOSS:  50 cc.   BRIEF HISTORY:  This 70 year old female presented to me last month with a  history of urinary incontinence.  Dr. Katherine Roan is her gynecologist,  and sent her in for evaluation of possible surgical management of this  incontinence.  She is scheduled for an enterocele and rectocele repair.  Evaluation in the office revealed normal bladder capacity, no involuntary  contractions and normal urodynamic findings.  She did have incontinence with  Valsalva. It was recommended that she undergo a procedure to place sling  material underneath her bladder neck.  We talked about the [facility] procedure  versus ________.  She presents at this time for [facility] procedure.  She is  aware of the risks and complications and desires to proceed.   DESCRIPTION OF PROCEDURE:  The patient was administered a general anesthetic  and underwent enterocele repair by Dr. Elana Alm.  Following this, the [facility]  procedure commenced.  He had already opened up the anterior vaginal fascia.   I constructed two transverse incisions just superior to the pubic bone  bilaterally.  With the bladder emptied, the [facility] needles were passed down  through these incisions bilaterally and along the posterior aspect of the  pubic bone, through the vaginal  fascia lateral to the urethra bilaterally.  Following adequate placement of these needles, cystoscopic view of the  urethra and bladder revealed no evidence of invasion of the bladder from  these needles.  The [facility] sling was then snapped on the end of these  needles, and the needles brought up anteriorly.  In this manner, the sling  was placed underneath the mid/proximal urethra.  The Foley balloon had been  over-inflated to identify the bladder neck.  There was a mild amount of  slack underneath the nylon mesh, between that and the urethra.  This was  left in that manner.  The  covering of the [facility] mesh was then removed.  The  mesh was cut underneath the skin  edges.  Inspection of the urethra and the mesh revealed still a good amount  of slack underneath the urethra.  The skin edges were reapproximated using  Dermabond.  Dr. Elana Alm then commenced with his rectocele repair.   The  patient tolerated the procedure well.  The catheter was left indwelling.                                               Bertram Millard. Dahlstedt, M.D.    SMD/MEDQ  D:  09/06/2003  T:  09/06/2003  Job:  [id number]

## 2011-05-16 NOTE — Discharge Summary (Signed)
   NAME:  Dominique Clark, Dominique Clark                          ACCOUNT NO.:  [id number]   MEDICAL RECORD NO.:  [id number]                   PATIENT TYPE:  INP   LOCATION:  0477                                 FACILITY:  [facility]   PHYSICIAN:  Katherine Roan, M.D.               DATE OF BIRTH:  04-Dec-1941   DATE OF ADMISSION:  09/06/2003  DATE OF DISCHARGE:  09/08/2003                                 DISCHARGE SUMMARY   ADMISSION DIAGNOSES:  1. Vaginal prolapse.  2. Stress urinary incontinence.   DISCHARGE DIAGNOSES:  1. Vaginal prolapse.  2. Stress urinary incontinence.   OPERATION PERFORMED:  1. SPARC retropubic sling.  2. Anterior/posterior repair.  3. Enterocele repair.   BRIEF HISTORY:  [name] is a 70 year old female with complete genital  prolapse status post hysterectomy for repair.  She had minimal stress  incontinence but it was felt that a sling would be of benefit to her.  She  was evaluated by Bertram Millard. Dahlstedt, M.D.   Her comorbidities include GERD.  She is on hormone replacement therapy.  She  had a past history of breast cysts.  She had some form of bladder surgery in  the past.  In 1992 she had laparoscopic BSO and hydrosalpinx.   LABORATORIES:  Admission hemoglobin was 13, hematocrit 40.  Cardiogram was  within normal limits.  Chest x-ray revealed no active disease, a healed left  sixth rib fracture.   HOSPITAL COURSE:  The patient was admitted to the hospital.  Underwent an  uneventful SPARC sling by Bertram Millard. Dahlstedt, M.D., pelvic examination  under anesthesia, anterior/posterior repair, and enterocele repair.  Her  postoperative course was uncomplicated.  Foley catheter was removed and she  did very well.  She was  discharged on the 10th of September voiding satisfactorily.  Asked to call  for fever, bleeding, and to return to the office in two weeks.   CONDITION ON DISCHARGE:  Improved.                                               Katherine Roan,  M.D.    SDM/MEDQ  D:  09/20/2003  T:  09/20/2003  Job:  [id number]   cc:   Bertram Millard. Dahlstedt, M.D.  [address]  [place]  [place] [address]  Fax: [phone]

## 2011-05-16 NOTE — Cardiovascular Report (Signed)
[place]  Patient:    Dominique Clark, Dominique Clark                         MRN: [id number] Proc. Date: 06/16/00 Adm. Date:  [id number] Disc. Date: [id number] Attending:  Veneda Melter                        Cardiac Catheterization  PROCEDURES PERFORMED: 1. Left heart catheterization. 2. Selective coronary angiography. 3. Left ventriculogram. 4. Perclose suture closure, right femoral artery.  DIAGNOSES: 1. Mild coronary artery disease by angiogram. 2. Normal left ventricular systolic function.  HISTORY:  Ms. Comes is a pleasant 70 year old white female with multiple cardiac risk factors who has recently experienced chest discomfort, fatigue, and dyspnea on exertion.  The patient underwent a dipyridamole Cardiolite study on June 03, 2000, which showed attenuation in the inferior and anterior walls, consistent with diaphragmatic and breast artifact; however, there did appear to be a small defect in the anterior apex.  She presents now for left heart catheterization.  DESCRIPTION OF PROCEDURE:  After informed consent was obtained, the patient was brought to the catheterization lab where both groins were sterilely prepped and draped.  Lidocaine, 1%, was used to infiltrate the right groin.  A 6-French sheath was placed in the right femoral artery using the modified Seldinger technique.  The 6-French JL4 and JR4 catheters were then used to engage the left and right coronary arteries, and selective angiography was recorded in various projections using manual injections of contrast.  A 6-French pigtail catheter was then advanced to the left ventricle, and a left ventriculogram was performed using power injections of contrast.  At the termination of the case, a Perclose suture closure device was deployed to the right femoral artery until adequate hemostasis was achieved.  The patient tolerated the procedure well and was transferred to the floor in stable condition.   Findings are as follows:  FINDINGS: 1. Left main trunk:  Large caliber vessel, mild irregularities. 2. LAD:  This is a medium caliber vessel that provides two diagonal branches.    The LAD has mild irregularities, 10-15%, in the proximal segment. 3. Left circumflex artery:  Large caliber vessel that consists of a large    first marginal branch in the mid section and further trifurcates distally.    The left circumflex system has mild irregularities of 10-20% in the    proximal segment. 4. The right coronary artery is dominant.  This is a small caliber vessel that    provides a small posterior descending artery and posterior ventricular    branch at the terminal segment.  There is a mild narrowing of 20% in the    proximal segment of the RCA.  LV:  Normal end systolic and end diastolic dimensions.  Overall left ventricular function is well preserved.  Ejection fraction is greater than 65%.  There is no mitral regurgitation.  LV pressure is 143/0.  Aortic is 143/71.  LVEDP equals 14.  ASSESSMENT AND PLAN:  Ms. Wojdyla is a 70 year old white female with trivial coronary artery disease by angiogram.  The patient also has had a false positive stress test.  At this point, her coronary disease will be medically managed, and other causes of chest discomfort will be investigated. DD:  06/16/00 TD:  06/17/00 Job: [id number]

## 2011-05-17 ENCOUNTER — Emergency Department (HOSPITAL_COMMUNITY)
Admission: EM | Admit: 2011-05-17 | Discharge: 2011-05-17 | Disposition: A | Discharge status: Home / Self Care | Payer: Medicare Other | Attending: Emergency Medicine | Admitting: Emergency Medicine

## 2011-05-17 DIAGNOSIS — J4489 Other specified chronic obstructive pulmonary disease: Secondary | ICD-10-CM | POA: Insufficient documentation

## 2011-05-17 DIAGNOSIS — Z23 Encounter for immunization: Secondary | ICD-10-CM | POA: Insufficient documentation

## 2011-05-17 DIAGNOSIS — K219 Gastro-esophageal reflux disease without esophagitis: Secondary | ICD-10-CM | POA: Insufficient documentation

## 2011-05-17 DIAGNOSIS — J449 Chronic obstructive pulmonary disease, unspecified: Secondary | ICD-10-CM | POA: Insufficient documentation

## 2011-05-17 DIAGNOSIS — Z203 Contact with and (suspected) exposure to rabies: Secondary | ICD-10-CM | POA: Insufficient documentation

## 2011-05-17 DIAGNOSIS — Z79899 Other long term (current) drug therapy: Secondary | ICD-10-CM | POA: Insufficient documentation

## 2011-05-20 ENCOUNTER — Inpatient Hospital Stay (INDEPENDENT_AMBULATORY_CARE_PROVIDER_SITE_OTHER)
Admission: RE | Admit: 2011-05-20 | Discharge: 2011-05-20 | Disposition: A | Discharge status: Home / Self Care | Payer: Medicare Other | Source: Ambulatory Visit | Attending: Family Medicine | Admitting: Family Medicine

## 2011-05-20 DIAGNOSIS — Z23 Encounter for immunization: Secondary | ICD-10-CM

## 2011-05-24 ENCOUNTER — Inpatient Hospital Stay (INDEPENDENT_AMBULATORY_CARE_PROVIDER_SITE_OTHER)
Admission: RE | Admit: 2011-05-24 | Discharge: 2011-05-24 | Disposition: A | Discharge status: Home / Self Care | Payer: Medicare Other | Source: Ambulatory Visit | Attending: Emergency Medicine | Admitting: Emergency Medicine

## 2011-05-24 DIAGNOSIS — Z23 Encounter for immunization: Secondary | ICD-10-CM

## 2011-05-31 ENCOUNTER — Inpatient Hospital Stay (INDEPENDENT_AMBULATORY_CARE_PROVIDER_SITE_OTHER)
Admission: RE | Admit: 2011-05-31 | Discharge: 2011-05-31 | Disposition: A | Discharge status: Home / Self Care | Payer: Medicare Other | Source: Ambulatory Visit

## 2011-05-31 DIAGNOSIS — Z23 Encounter for immunization: Secondary | ICD-10-CM

## 2011-09-19 ENCOUNTER — Ambulatory Visit (INDEPENDENT_AMBULATORY_CARE_PROVIDER_SITE_OTHER): Payer: Medicare Other

## 2011-09-19 ENCOUNTER — Inpatient Hospital Stay (INDEPENDENT_AMBULATORY_CARE_PROVIDER_SITE_OTHER)
Admission: RE | Admit: 2011-09-19 | Discharge: 2011-09-19 | Disposition: A | Discharge status: Home / Self Care | Payer: Self-pay | Source: Ambulatory Visit | Attending: Family Medicine | Admitting: Family Medicine

## 2011-09-19 DIAGNOSIS — S20219A Contusion of unspecified front wall of thorax, initial encounter: Secondary | ICD-10-CM

## 2011-10-02 ENCOUNTER — Encounter: Payer: Self-pay | Admitting: Family Medicine

## 2011-10-02 ENCOUNTER — Ambulatory Visit (INDEPENDENT_AMBULATORY_CARE_PROVIDER_SITE_OTHER): Payer: Medicare Other | Admitting: Family Medicine

## 2011-10-02 DIAGNOSIS — R0789 Other chest pain: Secondary | ICD-10-CM | POA: Insufficient documentation

## 2011-10-02 DIAGNOSIS — R071 Chest pain on breathing: Secondary | ICD-10-CM

## 2011-10-02 MED ORDER — METHOCARBAMOL 500 MG PO TABS: 1000.0000 mg | ORAL_TABLET | Freq: Four times a day (QID) | ORAL | Status: DC
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
## 2011-10-02 NOTE — Assessment & Plan Note (Signed)
Benign exam, she has made some improvement.  Use pillow for chest wall reinforcement and robaxin prn.  She needs to take deep breaths for effective pulmonary toilet and this was discussed.  F/u prn.  She will likely make continued but slow progress.  No need to reimage today.

## 2011-10-02 NOTE — Patient Instructions (Signed)
Get a pillow and press it to your chest when you cough.  The pain should gradually get better.   Schedule a physical for this fall or winter.

## 2011-10-09 LAB — BASIC METABOLIC PANEL
BUN: 10
CO2: 30
Calcium: 9.1
Calcium: 9.4
Creatinine, Ser: 0.59
GFR calc Af Amer: >60
GFR calc non Af Amer: >60
GFR calc non Af Amer: >60
Glucose, Bld: 108 — ABNORMAL HIGH
Potassium: 4
Sodium: 141
Sodium: 141

## 2011-10-09 LAB — EXPECTORATED SPUTUM ASSESSMENT W GRAM STAIN, RFLX TO RESP C

## 2011-10-09 LAB — CBC
HCT: 36.2
Hemoglobin: 11.8 — ABNORMAL LOW
Hemoglobin: 11.9 — ABNORMAL LOW
MCHC: 32.4
MCHC: 32.7
Platelets: 229
RBC: 3.84 — ABNORMAL LOW
RDW: 13.1
WBC: 10.8 — ABNORMAL HIGH

## 2011-10-09 LAB — CULTURE, RESPIRATORY W GRAM STAIN: Culture: NORMAL

## 2011-12-11 ENCOUNTER — Ambulatory Visit (INDEPENDENT_AMBULATORY_CARE_PROVIDER_SITE_OTHER): Payer: Medicare Other | Admitting: Family Medicine

## 2011-12-11 ENCOUNTER — Encounter: Payer: Self-pay | Admitting: Family Medicine

## 2011-12-11 DIAGNOSIS — J449 Chronic obstructive pulmonary disease, unspecified: Secondary | ICD-10-CM

## 2011-12-11 DIAGNOSIS — J4489 Other specified chronic obstructive pulmonary disease: Secondary | ICD-10-CM

## 2011-12-11 MED ORDER — PREDNISONE 20 MG PO TABS: ORAL_TABLET | ORAL | Status: DC

## 2011-12-11 MED ORDER — AZITHROMYCIN 250 MG PO TABS: ORAL_TABLET | ORAL | Status: DC

## 2011-12-11 NOTE — Progress Notes (Signed)
COPD flare Inc in sx over last few days Sputum production increased from baseline: now more than normal Increased cough:yes Increased SOB:yes Using O2:no Coughing up discolored sputum.  Wheezing more than normal. Needs refill on advair.   xopenex helps with sx.    Sugar was ~120 or lower recently.  Last a1c was controlled.   Meds, vitals, and allergies reviewed.   ROS: See HPI.  Otherwise negative.    GEN: nad, alert and oriented HEENT: mucous membranes moist NECK: supple w/o LA CV: rrr. PULM: no focal dec in BS but exp wheeze noted and coarse BS noted, no inc wob ABD: soft, +bs EXT: no edema SKIN: no acute rash

## 2011-12-11 NOTE — Patient Instructions (Signed)
Start the antibiotics and prednisone today.  Take the prednisone with food.  If you aren't getting better, let us know.  Take care. Keep using your inhalers.  See Aram Beecham on the way out too see if you can get help with the advair 250/50.

## 2011-12-12 ENCOUNTER — Encounter: Payer: Self-pay | Admitting: Family Medicine

## 2011-12-12 NOTE — Assessment & Plan Note (Signed)
Copd flare.  Try to limit oral steroid dose due to DM2.  D/w pt.  Start abx, 4 samples of advair given.  Use inhalers and then f/u prn.  She agrees.  Nontoxic.  Okay for outpatient f/u.

## 2011-12-19 ENCOUNTER — Telehealth: Payer: Self-pay | Admitting: Internal Medicine

## 2011-12-19 MED ORDER — AZITHROMYCIN 250 MG PO TABS: ORAL_TABLET | ORAL | Status: AC

## 2011-12-19 NOTE — Telephone Encounter (Signed)
Patient was seen on December 13th for COPD for flare and she has taken all of the antibiotic Z-pac.  She states it usually takes 2 rounds of antibiotic for her to get well.  She is still wheezing and coughing up yellow mucus and would like to have another Rx for Z-pak.

## 2011-12-19 NOTE — Telephone Encounter (Signed)
F/u if worsening. rx sent.

## 2011-12-19 NOTE — Telephone Encounter (Signed)
Spoke with patient and advised results   

## 2012-01-08 ENCOUNTER — Encounter: Payer: Self-pay | Admitting: Family Medicine

## 2012-01-08 ENCOUNTER — Ambulatory Visit (INDEPENDENT_AMBULATORY_CARE_PROVIDER_SITE_OTHER): Payer: Medicare Other | Admitting: Family Medicine

## 2012-01-08 DIAGNOSIS — R071 Chest pain on breathing: Secondary | ICD-10-CM

## 2012-01-08 DIAGNOSIS — R0789 Other chest pain: Secondary | ICD-10-CM

## 2012-01-08 MED ORDER — TRAMADOL HCL 50 MG PO TABS: 50.0000 mg | ORAL_TABLET | Freq: Four times a day (QID) | ORAL | Status: DC | PRN
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
## 2012-01-08 NOTE — Patient Instructions (Addendum)
I would take tramadol.  Take tylenol (2 regular strength, 3 times a day).  No heavy lifting.  Take it easy.  If the pain doesn't get better or if you have more trouble swallowing, then let me know.   Schedule a diabetes check in 2 months, visit.  Labs ahead of time.

## 2012-01-11 ENCOUNTER — Encounter: Payer: Self-pay | Admitting: Family Medicine

## 2012-01-11 NOTE — Assessment & Plan Note (Addendum)
Likely aggravated by recent activity.  Reassuring exam, unlikely to be cardiac.  Tylenol and tramadol in meantime.   The episode of dysphagia with the pills was isolated and likely due to hiatal hernia, ie unrelated to the above.  Will call back if sx continue with dysphagia.

## 2012-01-27 ENCOUNTER — Encounter: Payer: Self-pay | Admitting: Family Medicine

## 2012-01-27 ENCOUNTER — Ambulatory Visit (INDEPENDENT_AMBULATORY_CARE_PROVIDER_SITE_OTHER)
Admission: RE | Admit: 2012-01-27 | Discharge: 2012-01-27 | Disposition: A | Discharge status: Home / Self Care | Payer: Medicare Other | Source: Ambulatory Visit | Attending: Family Medicine | Admitting: Family Medicine

## 2012-01-27 ENCOUNTER — Ambulatory Visit (INDEPENDENT_AMBULATORY_CARE_PROVIDER_SITE_OTHER): Payer: Medicare Other | Admitting: Family Medicine

## 2012-01-27 VITALS — BP 142/80 | HR 88 | Temp 98.1°F | Wt 144.0 lb

## 2012-01-27 DIAGNOSIS — R0789 Other chest pain: Secondary | ICD-10-CM

## 2012-01-27 DIAGNOSIS — R071 Chest pain on breathing: Secondary | ICD-10-CM

## 2012-01-27 NOTE — Progress Notes (Signed)
Monday she had feeling like something was in the middle of chest, just to the right of midline, this is the same pain as prev.  She was coughing and sneezing.  The pain got worse but is better today.  She took tylenol for pain prev with some relief.  She didn't have dysphagia, swallowing was good.  She can have positional pain, pain with pressure/palpation of chest wall.  Prev notes and imaging reviewed.  Meds, vitals, and allergies reviewed.   ROS: See HPI.  Otherwise, noncontributory. nad  ncat  Mmm  Neck supple  rrr  ctab  Chest wall w/o bruising but ttp on R side of anterior/inferior chest, just lateral to inferior sternum.  No skin changes.  abd benign

## 2012-01-27 NOTE — Patient Instructions (Addendum)
I would take tylenol as needed and I expect this to gradually get better.  Take care.

## 2012-01-29 ENCOUNTER — Encounter: Payer: Self-pay | Admitting: Family Medicine

## 2012-01-29 NOTE — Assessment & Plan Note (Signed)
>  25 min spent with face to face with patient, >50% counseling and/or coordinating care.  cxr reviewed.  No sig pathology. Pt reassured. Should gradually resolved.  She has likely re-aggravated the irritation.  Okay for outpatient f/u.  She understood.

## 2012-02-17 ENCOUNTER — Other Ambulatory Visit (INDEPENDENT_AMBULATORY_CARE_PROVIDER_SITE_OTHER): Payer: Medicare Other

## 2012-02-17 DIAGNOSIS — E119 Type 2 diabetes mellitus without complications: Secondary | ICD-10-CM

## 2012-02-17 DIAGNOSIS — M8580 Other specified disorders of bone density and structure, unspecified site: Secondary | ICD-10-CM

## 2012-02-17 DIAGNOSIS — D649 Anemia, unspecified: Secondary | ICD-10-CM

## 2012-02-17 LAB — COMPREHENSIVE METABOLIC PANEL
Albumin: 4 g/dL (ref 3.5–5.2)
Alkaline Phosphatase: 50 U/L (ref 39–117)
BUN: 13 mg/dL (ref 6–23)
CO2: 27 mEq/L (ref 19–32)
Calcium: 9.7 mg/dL (ref 8.4–10.5)
Chloride: 105 mEq/L (ref 96–112)
Glucose, Bld: 93 mg/dL (ref 70–99)
Potassium: 4.7 mEq/L (ref 3.5–5.1)
Sodium: 141 mEq/L (ref 135–145)
Total Protein: 6.8 g/dL (ref 6.0–8.3)

## 2012-02-17 LAB — HEMOGLOBIN A1C: Hgb A1c MFr Bld: 6.1 % (ref 4.6–6.5)

## 2012-02-17 LAB — CBC WITH DIFFERENTIAL/PLATELET
Eosinophils Absolute: 0.3 10*3/uL (ref 0.0–0.7)
Eosinophils Relative: 4.8 % (ref 0.0–5.0)
Lymphocytes Relative: 35.1 % (ref 12.0–46.0)
MCV: 90.2 fl (ref 78.0–100.0)
Monocytes Absolute: 0.7 10*3/uL (ref 0.1–1.0)
Neutrophils Relative %: 49.6 % (ref 43.0–77.0)
Platelets: 252 10*3/uL (ref 150.0–400.0)
WBC: 7 10*3/uL (ref 4.5–10.5)

## 2012-02-17 LAB — LIPID PANEL
Cholesterol: 186 mg/dL (ref 0–200)
HDL: 56.5 mg/dL (ref >39.00)
LDL Cholesterol: 110 mg/dL — ABNORMAL HIGH (ref 0–99)
Total CHOL/HDL Ratio: 3
Triglycerides: 96 mg/dL (ref 0.0–149.0)
VLDL: 19.2 mg/dL (ref 0.0–40.0)

## 2012-02-19 ENCOUNTER — Other Ambulatory Visit: Payer: Self-pay | Admitting: Family Medicine

## 2012-02-19 DIAGNOSIS — E119 Type 2 diabetes mellitus without complications: Secondary | ICD-10-CM

## 2012-02-19 LAB — VITAMIN D 1,25 DIHYDROXY
Vitamin D 1, 25 (OH)2 Total: 64 pg/mL (ref 18–72)
Vitamin D2 1, 25 (OH)2: <8 pg/mL
Vitamin D3 1, 25 (OH)2: 64 pg/mL

## 2012-02-24 ENCOUNTER — Telehealth: Payer: Self-pay | Admitting: *Deleted

## 2012-02-24 NOTE — Telephone Encounter (Signed)
Patient says she needs a mammogram and called to schedule it.  They asked her if she was having an trouble and she told them about feeling a lump in her breast.  They [facility] and would need an order from you.  She says she has been hurting in her chest/breast area for some time and has seen you for it.  Please send order for mammo to [facility] or let me know if you need to see her for an OV.

## 2012-02-24 NOTE — Telephone Encounter (Signed)
She has known chest wall pain, likely MSK source and not breast pain.  Shirlee Limerick- please talk with me about ordering this.  She doesn't need OV.  Thanks.

## 2012-02-25 ENCOUNTER — Other Ambulatory Visit: Payer: Self-pay | Admitting: Family Medicine

## 2012-02-25 DIAGNOSIS — N644 Mastodynia: Secondary | ICD-10-CM

## 2012-03-05 ENCOUNTER — Other Ambulatory Visit: Payer: Self-pay | Admitting: Family Medicine

## 2012-03-05 ENCOUNTER — Ambulatory Visit
Admission: RE | Admit: 2012-03-05 | Discharge: 2012-03-05 | Disposition: A | Discharge status: Home / Self Care | Payer: Medicare Other | Source: Ambulatory Visit | Attending: Family Medicine | Admitting: Family Medicine

## 2012-03-05 ENCOUNTER — Other Ambulatory Visit: Payer: Self-pay | Admitting: *Deleted

## 2012-03-05 DIAGNOSIS — N644 Mastodynia: Secondary | ICD-10-CM

## 2012-03-05 MED ORDER — METFORMIN HCL 500 MG PO TABS: 500.0000 mg | ORAL_TABLET | Freq: Two times a day (BID) | ORAL | Status: DC

## 2012-03-08 ENCOUNTER — Ambulatory Visit (INDEPENDENT_AMBULATORY_CARE_PROVIDER_SITE_OTHER): Payer: Medicare Other | Admitting: Family Medicine

## 2012-03-08 ENCOUNTER — Encounter: Payer: Self-pay | Admitting: Family Medicine

## 2012-03-08 DIAGNOSIS — J449 Chronic obstructive pulmonary disease, unspecified: Secondary | ICD-10-CM

## 2012-03-08 DIAGNOSIS — R1310 Dysphagia, unspecified: Secondary | ICD-10-CM

## 2012-03-08 MED ORDER — AZITHROMYCIN 250 MG PO TABS: ORAL_TABLET | ORAL | Status: AC

## 2012-03-08 MED ORDER — FLUTICASONE-SALMETEROL 250-50 MCG/DOSE IN AEPB: INHALATION_SPRAY | RESPIRATORY_TRACT | Status: DC
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
## 2012-03-08 NOTE — Patient Instructions (Addendum)
See Shirlee Limerick about your referral before you leave today. Star the antibiotics today and use the inhalers.  Take care.

## 2012-03-09 DIAGNOSIS — R1310 Dysphagia, unspecified: Secondary | ICD-10-CM | POA: Insufficient documentation

## 2012-03-09 NOTE — Assessment & Plan Note (Signed)
Refer to GI.  S/p EGD with dilation in past.

## 2012-03-09 NOTE — Assessment & Plan Note (Signed)
Given her history and duration with sputum production, I would start abx.  D/w pt.  Nontoxic.

## 2012-03-18 ENCOUNTER — Telehealth: Payer: Self-pay

## 2012-03-18 NOTE — Telephone Encounter (Signed)
Patient notified as instructed by telephone. Pt scheduled appt 03/19/12 at 2:30pm :. Aram Beecham will open acute appt.

## 2012-03-18 NOTE — Telephone Encounter (Signed)
Pt left v/m that she saw Dr Para March on 03/08/12 and Z pak did not help and pt wants different antibiotic. Pt uses [facility] if pharmacy needed. I tried to reach pt at [phone] to get present symptoms and left v/m for pt to call back.

## 2012-03-18 NOTE — Telephone Encounter (Signed)
Pt called back and is still coughing with colored phlegm, ? Fever and still wheezing badly. Pt said she is the same as when saw Dr Para March on 03/08/12. Pt wants different antibiotic called to  [facility]. Pt will not be at home for awhile so pt said can leave detailed message on [phone].Please advise.

## 2012-03-18 NOTE — Telephone Encounter (Signed)
Opened in error

## 2012-03-18 NOTE — Telephone Encounter (Signed)
If she's still feeling that poorly, then she needs to be seen again and rechecked, either today or tomorrow.

## 2012-03-19 ENCOUNTER — Ambulatory Visit (INDEPENDENT_AMBULATORY_CARE_PROVIDER_SITE_OTHER): Payer: Medicare Other | Admitting: Family Medicine

## 2012-03-19 ENCOUNTER — Encounter: Payer: Self-pay | Admitting: Family Medicine

## 2012-03-19 VITALS — BP 122/66 | HR 82 | Temp 97.3°F | Wt 143.8 lb

## 2012-03-19 DIAGNOSIS — J449 Chronic obstructive pulmonary disease, unspecified: Secondary | ICD-10-CM

## 2012-03-19 DIAGNOSIS — J309 Allergic rhinitis, unspecified: Secondary | ICD-10-CM

## 2012-03-19 NOTE — Patient Instructions (Signed)
Use nasal saline and take claritin 10mg  a day for allergies.  Keep using your advair, and the xopenex if you need it.   Take care.

## 2012-03-19 NOTE — Progress Notes (Signed)
She was getting better.  See prev phone note about recent sx.  Had some clotted blood in L nostril. No fevers.  Cough and wheeze.  She finished the abx.  The seasons have changed and she has a history of pollen sensitivity.  Had some ST.    Meds, vitals, and allergies reviewed.   ROS: See HPI.  Otherwise, noncontributory.  nad ncat Mmm Scab noted in nostrils but no bleeding.  Sinuses not ttp, tms wnl x2 UAN but lungs clear.   rrr Ext well perfused.

## 2012-03-22 NOTE — Assessment & Plan Note (Signed)
Now not likely to have a COPD flare.  Likely allergy sx instead.  I d/w pt.  She doesn't appear to need abx or oral steroids.  Continue regular meds but use clartin and nasal saline.  F/u prn.

## 2012-04-06 ENCOUNTER — Other Ambulatory Visit: Payer: Self-pay | Admitting: *Deleted

## 2012-04-06 MED ORDER — METOPROLOL SUCCINATE ER 25 MG PO TB24: 25.0000 mg | ORAL_TABLET | Freq: Every day | ORAL | Status: DC

## 2012-04-20 ENCOUNTER — Encounter: Payer: Self-pay | Admitting: Family Medicine

## 2012-04-20 DIAGNOSIS — K449 Diaphragmatic hernia without obstruction or gangrene: Secondary | ICD-10-CM | POA: Insufficient documentation

## 2012-05-10 ENCOUNTER — Other Ambulatory Visit: Payer: Self-pay | Admitting: *Deleted

## 2012-05-10 MED ORDER — LEVALBUTEROL TARTRATE 45 MCG/ACT IN AERO: 1.0000 | INHALATION_SPRAY | RESPIRATORY_TRACT | Status: DC | PRN

## 2012-05-10 MED ORDER — FLUTICASONE-SALMETEROL 250-50 MCG/DOSE IN AEPB: INHALATION_SPRAY | RESPIRATORY_TRACT | Status: DC

## 2012-05-10 MED ORDER — ESOMEPRAZOLE MAGNESIUM 40 MG PO CPDR: 40.0000 mg | DELAYED_RELEASE_CAPSULE | Freq: Two times a day (BID) | ORAL | Status: DC

## 2012-05-11 ENCOUNTER — Telehealth: Payer: Self-pay | Admitting: *Deleted

## 2012-05-11 NOTE — Telephone Encounter (Signed)
[facility] says the Xopenex HFA will need PA OR you can change to ProAir which is on the preferred list.  What is your preference?

## 2012-05-12 MED ORDER — ALBUTEROL SULFATE HFA 108 (90 BASE) MCG/ACT IN AERS: 1.0000 | INHALATION_SPRAY | RESPIRATORY_TRACT | Status: DC | PRN

## 2012-05-12 NOTE — Telephone Encounter (Signed)
proair sent

## 2012-05-14 ENCOUNTER — Ambulatory Visit: Payer: Medicare Other | Admitting: Family Medicine

## 2012-05-20 ENCOUNTER — Other Ambulatory Visit: Payer: Self-pay | Admitting: *Deleted

## 2012-05-20 MED ORDER — METHOCARBAMOL 500 MG PO TABS: ORAL_TABLET | ORAL | Status: DC

## 2012-05-20 NOTE — Telephone Encounter (Signed)
Last refill 03/04/2012.

## 2012-05-20 NOTE — Telephone Encounter (Signed)
Sent!

## 2012-07-30 ENCOUNTER — Other Ambulatory Visit: Payer: Self-pay | Admitting: *Deleted

## 2012-07-30 MED ORDER — TRAMADOL HCL 50 MG PO TABS: 50.0000 mg | ORAL_TABLET | Freq: Four times a day (QID) | ORAL | Status: DC | PRN

## 2012-07-30 NOTE — Telephone Encounter (Signed)
Sent!

## 2012-07-30 NOTE — Telephone Encounter (Signed)
Faxed refill request   

## 2012-08-03 ENCOUNTER — Other Ambulatory Visit: Payer: Medicare Other

## 2012-08-05 ENCOUNTER — Ambulatory Visit: Payer: Medicare Other | Admitting: Family Medicine

## 2012-08-09 ENCOUNTER — Encounter (HOSPITAL_COMMUNITY): Payer: Self-pay

## 2012-08-09 ENCOUNTER — Inpatient Hospital Stay (HOSPITAL_COMMUNITY)
Admission: EM | Admit: 2012-08-09 | Discharge: 2012-08-11 | DRG: 190 | Disposition: A | Discharge status: Home / Self Care | Payer: Medicare Other | Attending: Internal Medicine | Admitting: Internal Medicine

## 2012-08-09 ENCOUNTER — Ambulatory Visit (INDEPENDENT_AMBULATORY_CARE_PROVIDER_SITE_OTHER): Payer: Medicare Other | Admitting: Family Medicine

## 2012-08-09 ENCOUNTER — Emergency Department (HOSPITAL_COMMUNITY): Payer: Medicare Other

## 2012-08-09 ENCOUNTER — Encounter: Payer: Self-pay | Admitting: Family Medicine

## 2012-08-09 VITALS — BP 142/60 | HR 80 | Temp 98.1°F | Wt 134.0 lb

## 2012-08-09 DIAGNOSIS — R0902 Hypoxemia: Secondary | ICD-10-CM

## 2012-08-09 DIAGNOSIS — N393 Stress incontinence (female) (male): Secondary | ICD-10-CM

## 2012-08-09 DIAGNOSIS — M542 Cervicalgia: Secondary | ICD-10-CM

## 2012-08-09 DIAGNOSIS — M899 Disorder of bone, unspecified: Secondary | ICD-10-CM

## 2012-08-09 DIAGNOSIS — R0789 Other chest pain: Secondary | ICD-10-CM

## 2012-08-09 DIAGNOSIS — K219 Gastro-esophageal reflux disease without esophagitis: Secondary | ICD-10-CM

## 2012-08-09 DIAGNOSIS — J441 Chronic obstructive pulmonary disease with (acute) exacerbation: Secondary | ICD-10-CM

## 2012-08-09 DIAGNOSIS — J449 Chronic obstructive pulmonary disease, unspecified: Secondary | ICD-10-CM

## 2012-08-09 DIAGNOSIS — D72829 Elevated white blood cell count, unspecified: Secondary | ICD-10-CM | POA: Diagnosis present

## 2012-08-09 DIAGNOSIS — E119 Type 2 diabetes mellitus without complications: Secondary | ICD-10-CM | POA: Diagnosis present

## 2012-08-09 DIAGNOSIS — Z87891 Personal history of nicotine dependence: Secondary | ICD-10-CM

## 2012-08-09 DIAGNOSIS — J9600 Acute respiratory failure, unspecified whether with hypoxia or hypercapnia: Secondary | ICD-10-CM | POA: Diagnosis present

## 2012-08-09 DIAGNOSIS — K2970 Gastritis, unspecified, without bleeding: Secondary | ICD-10-CM

## 2012-08-09 DIAGNOSIS — H8110 Benign paroxysmal vertigo, unspecified ear: Secondary | ICD-10-CM

## 2012-08-09 DIAGNOSIS — R739 Hyperglycemia, unspecified: Secondary | ICD-10-CM | POA: Diagnosis present

## 2012-08-09 DIAGNOSIS — K5730 Diverticulosis of large intestine without perforation or abscess without bleeding: Secondary | ICD-10-CM

## 2012-08-09 DIAGNOSIS — J40 Bronchitis, not specified as acute or chronic: Secondary | ICD-10-CM

## 2012-08-09 DIAGNOSIS — K2990 Gastroduodenitis, unspecified, without bleeding: Secondary | ICD-10-CM

## 2012-08-09 DIAGNOSIS — D649 Anemia, unspecified: Secondary | ICD-10-CM

## 2012-08-09 DIAGNOSIS — K449 Diaphragmatic hernia without obstruction or gangrene: Secondary | ICD-10-CM

## 2012-08-09 DIAGNOSIS — Z79899 Other long term (current) drug therapy: Secondary | ICD-10-CM

## 2012-08-09 LAB — CBC WITH DIFFERENTIAL/PLATELET
Basophils Absolute: 0.1 10*3/uL (ref 0.0–0.1)
Basophils Relative: 0 % (ref 0–1)
Eosinophils Absolute: 0.1 10*3/uL (ref 0.0–0.7)
Eosinophils Relative: 1 % (ref 0–5)
MCH: 30.2 pg (ref 26.0–34.0)
MCV: 92.6 fL (ref 78.0–100.0)
Neutrophils Relative %: 82 % — ABNORMAL HIGH (ref 43–77)
Platelets: 223 10*3/uL (ref 150–400)
RBC: 4.04 MIL/uL (ref 3.87–5.11)
RDW: 13.5 % (ref 11.5–15.5)
WBC: 20.5 10*3/uL — ABNORMAL HIGH (ref 4.0–10.5)

## 2012-08-09 LAB — URINE MICROSCOPIC-ADD ON

## 2012-08-09 LAB — URINALYSIS, ROUTINE W REFLEX MICROSCOPIC
Hgb urine dipstick: NEGATIVE
Nitrite: NEGATIVE
Protein, ur: 100 mg/dL — AB
Specific Gravity, Urine: 1.038 — ABNORMAL HIGH (ref 1.005–1.030)
Urobilinogen, UA: 1 mg/dL (ref 0.0–1.0)

## 2012-08-09 LAB — BASIC METABOLIC PANEL
Calcium: 9.7 mg/dL (ref 8.4–10.5)
GFR calc Af Amer: 81 mL/min — ABNORMAL LOW (ref >90)
GFR calc non Af Amer: 70 mL/min — ABNORMAL LOW (ref >90)
Potassium: 4.2 mEq/L (ref 3.5–5.1)
Sodium: 137 mEq/L (ref 135–145)

## 2012-08-09 LAB — TROPONIN I: Troponin I: <0.3 ng/mL (ref <0.30)

## 2012-08-09 MED ORDER — ONDANSETRON HCL 4 MG PO TABS: 4.0000 mg | ORAL_TABLET | Freq: Four times a day (QID) | ORAL | Status: DC | PRN

## 2012-08-09 MED ORDER — INSULIN ASPART 100 UNIT/ML ~~LOC~~ SOLN
0.0000 [IU] | Freq: Three times a day (TID) | SUBCUTANEOUS | Status: DC
  Administered 2012-08-10: 2 [IU] via SUBCUTANEOUS
  Administered 2012-08-10: 3 [IU] via SUBCUTANEOUS
  Administered 2012-08-10: 1 [IU] via SUBCUTANEOUS

## 2012-08-09 MED ORDER — IPRATROPIUM BROMIDE 0.02 % IN SOLN
RESPIRATORY_TRACT | Status: AC
  Administered 2012-08-10: 0.5 mg
  Filled 2012-08-09: qty 2.5

## 2012-08-09 MED ORDER — BUDESONIDE 0.5 MG/2ML IN SUSP
0.5000 mg | Freq: Two times a day (BID) | RESPIRATORY_TRACT | Status: DC
  Administered 2012-08-09 – 2012-08-11 (×4): 0.5 mg via RESPIRATORY_TRACT
  Filled 2012-08-09 (×6): qty 2

## 2012-08-09 MED ORDER — ALBUTEROL (5 MG/ML) CONTINUOUS INHALATION SOLN
INHALATION_SOLUTION | RESPIRATORY_TRACT | Status: AC
  Filled 2012-08-09: qty 80

## 2012-08-09 MED ORDER — IPRATROPIUM BROMIDE 0.02 % IN SOLN
0.5000 mg | Freq: Four times a day (QID) | RESPIRATORY_TRACT | Status: DC
  Administered 2012-08-09 – 2012-08-11 (×7): 0.5 mg via RESPIRATORY_TRACT
  Filled 2012-08-09 (×7): qty 2.5

## 2012-08-09 MED ORDER — LEVOFLOXACIN 500 MG PO TABS
500.0000 mg | ORAL_TABLET | Freq: Every day | ORAL | Status: DC
  Administered 2012-08-09 – 2012-08-11 (×3): 500 mg via ORAL
  Filled 2012-08-09 (×3): qty 1

## 2012-08-09 MED ORDER — SODIUM CHLORIDE 0.9 % IJ SOLN: 3.0000 mL | Freq: Two times a day (BID) | INTRAMUSCULAR | Status: DC

## 2012-08-09 MED ORDER — ONDANSETRON HCL 4 MG/2ML IJ SOLN: 4.0000 mg | Freq: Four times a day (QID) | INTRAMUSCULAR | Status: DC | PRN

## 2012-08-09 MED ORDER — ENOXAPARIN SODIUM 40 MG/0.4ML ~~LOC~~ SOLN
40.0000 mg | SUBCUTANEOUS | Status: DC
  Administered 2012-08-09 – 2012-08-10 (×2): 40 mg via SUBCUTANEOUS
  Filled 2012-08-09 (×3): qty 0.4

## 2012-08-09 MED ORDER — METHYLPREDNISOLONE SODIUM SUCC 125 MG IJ SOLR
125.0000 mg | Freq: Once | INTRAMUSCULAR | Status: AC
  Administered 2012-08-10: 125 mg via INTRAVENOUS
  Filled 2012-08-09 (×2): qty 2

## 2012-08-09 MED ORDER — ALBUTEROL SULFATE (5 MG/ML) 0.5% IN NEBU
2.5000 mg | INHALATION_SOLUTION | Freq: Four times a day (QID) | RESPIRATORY_TRACT | Status: DC
  Administered 2012-08-09 – 2012-08-11 (×7): 2.5 mg via RESPIRATORY_TRACT
  Filled 2012-08-09 (×7): qty 0.5

## 2012-08-09 MED ORDER — ACETAMINOPHEN 325 MG PO TABS
650.0000 mg | ORAL_TABLET | Freq: Four times a day (QID) | ORAL | Status: DC | PRN
  Administered 2012-08-09: 650 mg via ORAL
  Filled 2012-08-09: qty 2

## 2012-08-09 MED ORDER — ALBUTEROL SULFATE (5 MG/ML) 0.5% IN NEBU: 2.5000 mg | INHALATION_SOLUTION | RESPIRATORY_TRACT | Status: DC | PRN

## 2012-08-09 MED ORDER — ACETAMINOPHEN 650 MG RE SUPP: 650.0000 mg | Freq: Four times a day (QID) | RECTAL | Status: DC | PRN

## 2012-08-09 MED ORDER — IPRATROPIUM BROMIDE 0.02 % IN SOLN: 0.5000 mg | Freq: Four times a day (QID) | RESPIRATORY_TRACT | Status: DC

## 2012-08-09 MED ORDER — METOPROLOL SUCCINATE ER 25 MG PO TB24
25.0000 mg | ORAL_TABLET | Freq: Every day | ORAL | Status: DC
  Administered 2012-08-10 – 2012-08-11 (×2): 25 mg via ORAL
  Filled 2012-08-09 (×2): qty 1

## 2012-08-09 MED ORDER — SODIUM CHLORIDE 0.9 % IV SOLN: INTRAVENOUS | Status: DC

## 2012-08-09 MED ORDER — TRAMADOL HCL 50 MG PO TABS
50.0000 mg | ORAL_TABLET | ORAL | Status: DC | PRN
  Administered 2012-08-10 (×2): 50 mg via ORAL
  Filled 2012-08-09 (×2): qty 1

## 2012-08-09 MED ORDER — PANTOPRAZOLE SODIUM 40 MG PO TBEC
80.0000 mg | DELAYED_RELEASE_TABLET | Freq: Two times a day (BID) | ORAL | Status: DC
  Administered 2012-08-09 – 2012-08-11 (×4): 80 mg via ORAL
  Filled 2012-08-09: qty 2
  Filled 2012-08-09: qty 1
  Filled 2012-08-09: qty 2
  Filled 2012-08-09: qty 1
  Filled 2012-08-09: qty 2

## 2012-08-09 MED ORDER — SODIUM CHLORIDE 0.9 % IJ SOLN
3.0000 mL | Freq: Two times a day (BID) | INTRAMUSCULAR | Status: DC
  Administered 2012-08-09 – 2012-08-10 (×3): 3 mL via INTRAVENOUS

## 2012-08-09 MED ORDER — ALBUTEROL SULFATE (5 MG/ML) 0.5% IN NEBU: 5.0000 mg | INHALATION_SOLUTION | RESPIRATORY_TRACT | Status: DC | PRN

## 2012-08-09 MED ORDER — METHOCARBAMOL 500 MG PO TABS
1000.0000 mg | ORAL_TABLET | Freq: Every day | ORAL | Status: DC
  Administered 2012-08-09 – 2012-08-10 (×2): 1000 mg via ORAL
  Filled 2012-08-09 (×3): qty 2

## 2012-08-09 MED ORDER — GUAIFENESIN ER 600 MG PO TB12
1200.0000 mg | ORAL_TABLET | Freq: Two times a day (BID) | ORAL | Status: DC | PRN
  Administered 2012-08-09 – 2012-08-10 (×2): 1200 mg via ORAL
  Filled 2012-08-09 (×3): qty 2

## 2012-08-09 NOTE — ED Notes (Signed)
Attempted to call report - nurse unavailable - will call back in 10 minutes

## 2012-08-09 NOTE — ED Provider Notes (Signed)
History     CSN: [id number]  Arrival date & time 08/09/12  1551   First MD Initiated Contact with Patient 08/09/12 1554      Chief Complaint  Patient presents with  . Shortness of Breath    hypoxic (80s) while in PCPs office; also prod cough of "dark" sputum - given 1 tx of albuterol then second tx of albuterol and atrovent -  sats 100% during tx    (Consider location/radiation/quality/duration/timing/severity/associated sxs/prior treatment) HPI Comments: Dominique Clark is a 71 y.o. Female was been ill for 2 days with general achiness, fever, and chills. She went to her doctor's office today, and was found to be hypoxic. She was treated by EMS with oxygen with improvement of her oxygen saturation to normal. She also was treated with albuterol nebulizer x2, and Atrovent times one. She has noticed, chills, sweating, headache, and a cough that is nonproductive, but having a sensation that she has to cough something up. She is able to eat and has not had any vomiting. She is usually her usual medications without relief. She did not try her rescue inhaler today.  She gets out of breath with walking and then improves with rest.  Patient is a 71 y.o. female presenting with shortness of breath. The history is provided by the patient.  Shortness of Breath  Associated symptoms include shortness of breath.    Past Medical History  Diagnosis Date  . Diabetes mellitus     type 2  . Gastritis     via EGD  . Hiatal hernia     s/p dilation 2004  . Diverticulosis     s/p colonoscopy  . COPD (chronic obstructive pulmonary disease)   . Stress incontinence, female   . Osteopenia     Past Surgical History  Procedure Date  . Bladder repair 9/04    bladder tack     No family history on file.  History  Substance Use Topics  . Smoking status: Former Games developer  . Smokeless tobacco: Not on file   Comment: 20 or more years   . Alcohol Use: Not on file    OB History    Grav Para Term Preterm  Abortions TAB SAB Ect Mult Living                  Review of Systems  Respiratory: Positive for shortness of breath.   All other systems reviewed and are negative.    Allergies  Review of patient's allergies indicates no known allergies.  Home Medications   Current Outpatient Rx  Name Route Sig Dispense Refill  . ACETAMINOPHEN 325 MG PO TABS Oral Take 325 mg by mouth 3 (three) times daily.    . ALBUTEROL SULFATE HFA 108 (90 BASE) MCG/ACT IN AERS Inhalation Inhale 1-2 puffs into the lungs every 4 (four) hours as needed for wheezing. 1 Inhaler 12  . VITAMIN D 1000 UNITS PO TABS Oral Take 1,000 Units by mouth daily. Take one tablet by mouth 2 times daily     . ESOMEPRAZOLE MAGNESIUM 40 MG PO CPDR Oral Take 1 capsule (40 mg total) by mouth 2 (two) times daily. 60 capsule 11  . FLUTICASONE-SALMETEROL 250-50 MCG/DOSE IN AEPB  1 inhalation two times a day 60 each 12  . GUAIFENESIN ER 600 MG PO TB12 Oral Take 1,200 mg by mouth 2 (two) times daily as needed.    . MELOXICAM 15 MG PO TABS Oral Take 15 mg by mouth daily.      Marland Kitchen  METFORMIN HCL 500 MG PO TABS Oral Take 1 tablet (500 mg total) by mouth 2 (two) times daily with a meal. 60 tablet 6  . METHOCARBAMOL 500 MG PO TABS Oral Take 1,000 mg by mouth at bedtime.    Marland Kitchen METOPROLOL SUCCINATE ER 25 MG PO TB24 Oral Take 1 tablet (25 mg total) by mouth daily. 30 tablet 6  . TRAMADOL HCL 50 MG PO TABS Oral Take 50-100 mg by mouth every 4 (four) hours as needed. For pain      BP 114/43  Pulse 100  Temp 98.3 F (36.8 C) (Oral)  Resp 20  SpO2 98%  Physical Exam  Nursing note and vitals reviewed. Constitutional: She is oriented to person, place, and time. She appears well-developed and well-nourished.  HENT:  Head: Normocephalic and atraumatic.  Eyes: Conjunctivae and EOM are normal. Pupils are equal, round, and reactive to light.  Neck: Normal range of motion and phonation normal. Neck supple.  Cardiovascular: Normal rate, regular rhythm and  intact distal pulses.   Pulmonary/Chest: She is in respiratory distress (mild, cough). She has wheezes. She has no rales. She exhibits no tenderness.       generalized rhonchi  Abdominal: Soft. She exhibits no distension. There is no tenderness. There is no guarding.  Musculoskeletal: Normal range of motion.  Neurological: She is alert and oriented to person, place, and time. She has normal strength. She exhibits normal muscle tone.  Skin: Skin is warm and dry.  Psychiatric: She has a normal mood and affect. Her behavior is normal. Judgment and thought content normal.    ED Course  Procedures (including critical care time) Emergency department treatment: Oral Levaquin IV, Solu-Medrol  Reevaluation- 19:15- ambulation trial, patient failed, because she could not maintain normal  oxygen saturation and began to be dyspneic. When she laid down; she is 99% on room air, but desaturated to 93% with talking.   Date: 08/09/2012  Rate: 99  Rhythm: normal sinus rhythm  QRS Axis: normal  Intervals: normal  ST/T Wave abnormalities: normal  Conduction Disutrbances:none  Narrative Interpretation: PAC, possible RVH  Old EKG Reviewed: changes noted- possible evolution RV     Labs Reviewed  URINALYSIS, ROUTINE W REFLEX MICROSCOPIC - Abnormal; Notable for the following:    Color, Urine AMBER (*)  BIOCHEMICALS MAY BE AFFECTED BY COLOR   Specific Gravity, Urine 1.038 (*)     Bilirubin Urine SMALL (*)     Ketones, ur 15 (*)     Protein, ur 100 (*)     Leukocytes, UA TRACE (*)     All other components within normal limits  CBC WITH DIFFERENTIAL - Abnormal; Notable for the following:    WBC 20.5 (*)     Neutrophils Relative 82 (*)     Neutro Abs 16.7 (*)     Lymphocytes Relative 9 (*)     Monocytes Absolute 1.8 (*)     All other components within normal limits  BASIC METABOLIC PANEL - Abnormal; Notable for the following:    Glucose, Bld 167 (*)     GFR calc non Af Amer 70 (*)     GFR calc Af  Amer 81 (*)     All other components within normal limits  URINE MICROSCOPIC-ADD ON - Abnormal; Notable for the following:    Squamous Epithelial / LPF FEW (*)     Casts HYALINE CASTS (*)     All other components within normal limits  TROPONIN I  URINE CULTURE  Dg Chest Port 1 View  08/09/2012  *RADIOLOGY REPORT*  Clinical Data: Shortness of breath.  COPD.  PORTABLE CHEST - 1 VIEW  Comparison: PA and lateral chest 01/27/2012 and 09/19/2011.  Findings: The chest is hyperexpanded with attenuation of the pulmonary vasculature.  No focal airspace disease or effusion. Heart size is normal.  No pneumothorax.  Degenerative change about the shoulders and old right rib fractures are noted.  IMPRESSION: No acute abnormality.  Findings consistent with COPD.  Original Report Authenticated By: Bernadene Bell. D'ALESSIO, M.D.     1. Bronchitis   2. Hypoxia       MDM  COPD exacerbation, with a tightness. Doubt pneumonia, PE, ACS        Flint Melter, MD 08/09/12 2006

## 2012-08-09 NOTE — ED Notes (Signed)
Pt tolerated walking to the bathroom. Did not become dizzy

## 2012-08-09 NOTE — Assessment & Plan Note (Signed)
Presumed exacerbation, 911 promptly called and will transport to ER given the new O2 requirement.  She agrees with EMS transport.

## 2012-08-09 NOTE — Progress Notes (Signed)
Hypoxia on RA noted 88%, improved to 93-94% on 2L O2.  Sx started 2 days ago.  Cough, sputum, chills, SOB.  Discolored sputum.  Using inhalers.  Diffuse aches.   Meds, vitals, and allergies reviewed.   ROS: See HPI.  Otherwise, noncontributory.  Meds, vitals, and allergies reviewed.   ROS: See HPI.  Otherwise, noncontributory.  A&O but mild inc in breath noted at rest Mmm rrr Diffuse exp wheezes B Ext w/o edema

## 2012-08-09 NOTE — ED Notes (Signed)
Ambulated in hallway with minimal assistance; pt became slightly tachypneic (24.min), tachycardic (110), increased fatigue, hypoxic (89% RA); assisted back to bed; o2 sats increasing to 99% at rest

## 2012-08-09 NOTE — H&P (Signed)
Dominique Clark is an 71 y.o. female.   Patient was seen and examined on August 09, 2012 at 9 PM. PCP - Dr. Crawford Givens. Chief Complaint: Shortness of breath. HPI: 71 year-old female with known history of COPD and previous history of tobacco abuse has been experiencing shortness of breath over the last 3-4 days with cough. Since the symptoms did not get better she had gone to her PCPs office and was found to be hypoxic and wheezing and was referred to the ER. In the ER chest x-ray did not reveal any infiltrates. Patient was given nebulizer and steroids after which her shortness of breath has improved. Patient admitted for further management of COPD exacerbation. Patient's shortness of breath and also exertional. Denies any chest pain did have some subjective feeling of fever and chills.  Past Medical History  Diagnosis Date  . Diabetes mellitus     type 2  . Gastritis     via EGD  . Hiatal hernia     s/p dilation 2004  . Diverticulosis     s/p colonoscopy  . COPD (chronic obstructive pulmonary disease)   . Stress incontinence, female   . Osteopenia     Past Surgical History  Procedure Date  . Bladder repair 9/04    bladder tack     History reviewed. No pertinent family history. Social History:  reports that she has quit smoking. She does not have any smokeless tobacco history on file. She reports that she does not drink alcohol or use illicit drugs.  Allergies: No Known Allergies  Medications Prior to Admission  Medication Sig Dispense Refill  . acetaminophen (TYLENOL) 325 MG tablet Take 325 mg by mouth 3 (three) times daily.      Marland Kitchen albuterol (PROVENTIL HFA;VENTOLIN HFA) 108 (90 BASE) MCG/ACT inhaler Inhale 1-2 puffs into the lungs every 4 (four) hours as needed for wheezing.  1 Inhaler  12  . cholecalciferol (VITAMIN D) 1000 UNITS tablet Take 1,000 Units by mouth daily. Take one tablet by mouth 2 times daily       . esomeprazole (NEXIUM) 40 MG capsule Take 1 capsule (40 mg  total) by mouth 2 (two) times daily.  60 capsule  11  . Fluticasone-Salmeterol (ADVAIR DISKUS) 250-50 MCG/DOSE AEPB 1 inhalation two times a day  60 each  12  . guaiFENesin (MUCINEX) 600 MG 12 hr tablet Take 1,200 mg by mouth 2 (two) times daily as needed.      . meloxicam (MOBIC) 15 MG tablet Take 15 mg by mouth daily.        . metFORMIN (GLUCOPHAGE) 500 MG tablet Take 1 tablet (500 mg total) by mouth 2 (two) times daily with a meal.  60 tablet  6  . methocarbamol (ROBAXIN) 500 MG tablet Take 1,000 mg by mouth at bedtime.      . metoprolol succinate (TOPROL-XL) 25 MG 24 hr tablet Take 1 tablet (25 mg total) by mouth daily.  30 tablet  6  . traMADol (ULTRAM) 50 MG tablet Take 50-100 mg by mouth every 4 (four) hours as needed. For pain        Results for orders placed during the hospital encounter of 08/09/12 (from the past 48 hour(s))  CBC WITH DIFFERENTIAL     Status: Abnormal   Collection Time   08/09/12  4:58 PM      Component Value Range Comment   WBC 20.5 (*) 4.0 - 10.5 K/uL    RBC 4.04  3.87 -  5.11 MIL/uL    Hemoglobin 12.2  12.0 - 15.0 g/dL    HCT 16.1  09.6 - 04.5 %    MCV 92.6  78.0 - 100.0 fL    MCH 30.2  26.0 - 34.0 pg    MCHC 32.6  30.0 - 36.0 g/dL    RDW 40.9  81.1 - 91.4 %    Platelets 223  150 - 400 K/uL    Neutrophils Relative 82 (*) 43 - 77 %    Neutro Abs 16.7 (*) 1.7 - 7.7 K/uL    Lymphocytes Relative 9 (*) 12 - 46 %    Lymphs Abs 1.9  0.7 - 4.0 K/uL    Monocytes Relative 9  3 - 12 %    Monocytes Absolute 1.8 (*) 0.1 - 1.0 K/uL    Eosinophils Relative 1  0 - 5 %    Eosinophils Absolute 0.1  0.0 - 0.7 K/uL    Basophils Relative 0  0 - 1 %    Basophils Absolute 0.1  0.0 - 0.1 K/uL   BASIC METABOLIC PANEL     Status: Abnormal   Collection Time   08/09/12  4:58 PM      Component Value Range Comment   Sodium 137  135 - 145 mEq/L    Potassium 4.2  3.5 - 5.1 mEq/L    Chloride 99  96 - 112 mEq/L    CO2 26  19 - 32 mEq/L    Glucose, Bld 167 (*) 70 - 99 mg/dL    BUN  17  6 - 23 mg/dL    Creatinine, Ser 7.82  0.50 - 1.10 mg/dL    Calcium 9.7  8.4 - 95.6 mg/dL    GFR calc non Af Amer 70 (*) >90 mL/min    GFR calc Af Amer 81 (*) >90 mL/min   TROPONIN I     Status: Normal   Collection Time   08/09/12  4:58 PM      Component Value Range Comment   Troponin I <0.30  <0.30 ng/mL   URINALYSIS, ROUTINE W REFLEX MICROSCOPIC     Status: Abnormal   Collection Time   08/09/12  5:27 PM      Component Value Range Comment   Color, Urine AMBER (*) YELLOW BIOCHEMICALS MAY BE AFFECTED BY COLOR   APPearance CLEAR  CLEAR    Specific Gravity, Urine 1.038 (*) 1.005 - 1.030    pH 5.5  5.0 - 8.0    Glucose, UA NEGATIVE  NEGATIVE mg/dL    Hgb urine dipstick NEGATIVE  NEGATIVE    Bilirubin Urine SMALL (*) NEGATIVE    Ketones, ur 15 (*) NEGATIVE mg/dL    Protein, ur 213 (*) NEGATIVE mg/dL    Urobilinogen, UA 1.0  0.0 - 1.0 mg/dL    Nitrite NEGATIVE  NEGATIVE    Leukocytes, UA TRACE (*) NEGATIVE   URINE MICROSCOPIC-ADD ON     Status: Abnormal   Collection Time   08/09/12  5:27 PM      Component Value Range Comment   Squamous Epithelial / LPF FEW (*) RARE    WBC, UA 0-2  <3 WBC/hpf    RBC / HPF 0-2  <3 RBC/hpf    Bacteria, UA RARE  RARE    Casts HYALINE CASTS (*) NEGATIVE    Urine-Other MUCOUS PRESENT     GLUCOSE, CAPILLARY     Status: Abnormal   Collection Time   08/09/12  8:25 PM  Component Value Range Comment   Glucose-Capillary 171 (*) 70 - 99 mg/dL    Dg Chest [facility] 1 View  08/09/2012  *RADIOLOGY REPORT*  Clinical Data: Shortness of breath.  COPD.  PORTABLE CHEST - 1 VIEW  Comparison: PA and lateral chest 01/27/2012 and 09/19/2011.  Findings: The chest is hyperexpanded with attenuation of the pulmonary vasculature.  No focal airspace disease or effusion. Heart size is normal.  No pneumothorax.  Degenerative change about the shoulders and old right rib fractures are noted.  IMPRESSION: No acute abnormality.  Findings consistent with COPD.  Original Report  Authenticated By: Bernadene Bell. Maricela Curet, M.D.    Review of Systems  Constitutional: Positive for chills.  HENT: Negative.   Eyes: Negative.   Respiratory: Positive for cough, sputum production, shortness of breath and wheezing.   Cardiovascular: Negative.   Gastrointestinal: Negative.   Genitourinary: Negative.   Musculoskeletal: Negative.   Skin: Negative.   Neurological: Negative.   Endo/Heme/Allergies: Negative.   Psychiatric/Behavioral: Negative.     Blood pressure 114/43, pulse 100, temperature 98.3 F (36.8 C), temperature source Oral, resp. rate 20, height 5\' 7"  (1.702 m), SpO2 98.00%. Physical Exam  Constitutional: She is oriented to person, place, and time. She appears well-developed and well-nourished. No distress.  HENT:  Head: Normocephalic and atraumatic.  Right Ear: External ear normal.  Left Ear: External ear normal.  Nose: Nose normal.  Mouth/Throat: Oropharynx is clear and moist. No oropharyngeal exudate.  Eyes: Conjunctivae are normal. Pupils are equal, round, and reactive to light. Right eye exhibits no discharge. Left eye exhibits no discharge. No scleral icterus.  Neck: Normal range of motion. Neck supple.  Cardiovascular: Normal rate and regular rhythm.   Respiratory: Effort normal and breath sounds normal. No respiratory distress. She has no wheezes. She has no rales.  GI: Soft. Bowel sounds are normal. She exhibits no distension. There is no tenderness. There is no rebound.  Musculoskeletal: Normal range of motion. She exhibits no edema and no tenderness.  Neurological: She is alert and oriented to person, place, and time.       Moves all extremities.  Skin: Skin is warm and dry. No rash noted. She is not diaphoretic. No erythema.  Psychiatric: Her behavior is normal.     Assessment/Plan #1. COPD exacerbation - continue nebulizer, Pulmicort and antibiotics. Patient did receive one dose of Solu-Medrol and when I examined patient is not wheezing at this  time. Check 2-D echo as patient complains of exertional symptoms. #2. Leukocytosis - patient is afebrile. Patient is on antibiotics for COPD exacerbation presently. Closely follow CBC. #3. Diabetes mellitus2 - patient will be placed on sliding-scale coverage.  CODE STATUS - full code.  Eduard Clos. 08/09/2012, 9:20 PM

## 2012-08-09 NOTE — Progress Notes (Signed)
ANTIBIOTIC CONSULT NOTE - INITIAL  Pharmacy Consult for Levaquin Indication: COPD exacerbation?   No Known Allergies  Patient Measurements: Height: 5\' 7"  (170.2 cm) IBW/kg (Calculated) : 61.6   Vital Signs: Temp: 98.3 F (36.8 C) (08/12 1619) Temp src: Oral (08/12 1619) BP: 114/43 mmHg (08/12 1930) Pulse Rate: 100  (08/12 1930) Intake/Output from previous day:   Intake/Output from this shift:    Labs:  Basename 08/09/12 1658  WBC 20.5*  HGB 12.2  PLT 223  LABCREA --  CREATININE 0.82   The CrCl is unknown because both a height and weight (above a minimum accepted value) are required for this calculation. No results found for this basename: VANCOTROUGH:2,VANCOPEAK:2,VANCORANDOM:2,GENTTROUGH:2,GENTPEAK:2,GENTRANDOM:2,TOBRATROUGH:2,TOBRAPEAK:2,TOBRARND:2,AMIKACINPEAK:2,AMIKACINTROU:2,AMIKACIN:2, in the last Dominique hours   Microbiology: No results found for this or any previous visit (from the past 720 hour(s)).  Medical History: Past Medical History  Diagnosis Date  . Diabetes mellitus     type 2  . Gastritis     via EGD  . Hiatal hernia     s/p dilation 2004  . Diverticulosis     s/p colonoscopy  . COPD (chronic obstructive pulmonary disease)   . Stress incontinence, female   . Osteopenia     Medications:  Anti-infectives     Start     Dose/Rate Route Frequency Ordered Stop   08/09/12 1700   levofloxacin (LEVAQUIN) tablet 500 mg        500 mg Oral Daily 08/09/12 1628           Assessment: Dominique Clark with hx of COPD admitted 8/12 with low sats (88%), cough, chills, discolored sputum with suspected COPD exacerbation to start Levaquin. WBC 20.5, SCr 0.82/estCrCl~43mL/min, Afebrile. Urine culture pending. No respiratory culture available.   Goal of Therapy:  Clinical Resolution  Plan:  1. Continue Levaquin 500mg  po daily. 2. Follow-up renal function and culture results.   Fayne Norrie 08/09/2012,9:41 PM

## 2012-08-10 DIAGNOSIS — R0902 Hypoxemia: Secondary | ICD-10-CM

## 2012-08-10 DIAGNOSIS — J40 Bronchitis, not specified as acute or chronic: Secondary | ICD-10-CM

## 2012-08-10 LAB — COMPREHENSIVE METABOLIC PANEL
ALT: 8 U/L (ref 0–35)
AST: 14 U/L (ref 0–37)
Albumin: 3.3 g/dL — ABNORMAL LOW (ref 3.5–5.2)
Calcium: 9.6 mg/dL (ref 8.4–10.5)
GFR calc Af Amer: >90 mL/min (ref >90)
Potassium: 4.6 mEq/L (ref 3.5–5.1)
Sodium: 137 mEq/L (ref 135–145)
Total Protein: 6.9 g/dL (ref 6.0–8.3)

## 2012-08-10 LAB — URINE CULTURE
Colony Count: NO GROWTH
Culture: NO GROWTH

## 2012-08-10 LAB — CBC WITH DIFFERENTIAL/PLATELET
Basophils Relative: 0 % (ref 0–1)
Eosinophils Relative: 0 % (ref 0–5)
HCT: 34.8 % — ABNORMAL LOW (ref 36.0–46.0)
Hemoglobin: 11.4 g/dL — ABNORMAL LOW (ref 12.0–15.0)
MCH: 30.3 pg (ref 26.0–34.0)
MCHC: 32.8 g/dL (ref 30.0–36.0)
MCV: 92.6 fL (ref 78.0–100.0)
Monocytes Absolute: 0.2 10*3/uL (ref 0.1–1.0)
Monocytes Relative: 1 % — ABNORMAL LOW (ref 3–12)
Neutro Abs: 15.2 10*3/uL — ABNORMAL HIGH (ref 1.7–7.7)

## 2012-08-10 LAB — GLUCOSE, CAPILLARY
Glucose-Capillary: 219 mg/dL — ABNORMAL HIGH (ref 70–99)
Glucose-Capillary: 123 mg/dL — ABNORMAL HIGH (ref 70–99)
Glucose-Capillary: 187 mg/dL — ABNORMAL HIGH (ref 70–99)

## 2012-08-10 MED ORDER — PREDNISONE 20 MG PO TABS
40.0000 mg | ORAL_TABLET | Freq: Every day | ORAL | Status: DC
  Administered 2012-08-11: 40 mg via ORAL
  Filled 2012-08-10 (×2): qty 2

## 2012-08-10 MED ORDER — PNEUMOCOCCAL VAC POLYVALENT 25 MCG/0.5ML IJ INJ
0.5000 mL | INJECTION | Freq: Once | INTRAMUSCULAR | Status: AC
  Administered 2012-08-10: 0.5 mL via INTRAMUSCULAR
  Filled 2012-08-10: qty 0.5

## 2012-08-10 NOTE — Progress Notes (Signed)
  Echocardiogram 2D Echocardiogram has been performed.  Dominique Clark 08/10/2012, 10:32 AM

## 2012-08-10 NOTE — Progress Notes (Signed)
Nutrition Brief Note  Patient identified on the Nutrition Risk Report for problems chewing or swallowing liquids and/or foods; wears dentures.   Body mass index is 20.99 kg/(m^2). Pt meets criteria for Normal based on current BMI.   Current diet order is Carbohydrate Modified Medium; no % PO intake recorded, however, reports a good appetite. Labs and medications reviewed.   No nutrition interventions warranted at this time. If nutrition issues arise, please consult RD.   Kirkland Hun, RD, LDN Pager #: [phone] After-Hours Pager #: [phone]

## 2012-08-10 NOTE — Progress Notes (Addendum)
[facility] PROGRESS NOTE  BELA BONAPARTE ZOX:[id number] DOB: 14-Aug-1941 DOA: 08/09/2012 PCP: Crawford Givens, MD  Assessment/Plan: Principal Problem:  *COPD with acute exacerbation Active Problems:  DIABETES MELLITUS, TYPE II  Leucocytosis  #1. COPD exacerbation - continue nebulizer, Pulmicort and antibiotics. Patient did receive one dose of Solu-Medrol: not wheezing at this time. Check 2-D echo as patient complains of exertional symptoms.  #2. Leukocytosis - patient is afebrile. Patient is on antibiotics for COPD exacerbation presently. Trending down.  #3. Diabetes mellitus 2 - patient will be placed on sliding-scale coverage #4. Acute respiratory failure- wean off O2   Code Status: full Family Communication: patient at bedside Disposition Plan: home once better-soon  HPI/Subjective: Patient feeling better, wants to go home  Objective: Filed Vitals:   08/10/12 0200 08/10/12 0500 08/10/12 0857 08/10/12 0941  BP:  117/66  131/74  Pulse: 101 86  96  Temp:  97.4 F (36.3 C)    TempSrc:  Oral    Resp: 18     Height:      Weight:      SpO2: 97% 97% 96%    No intake or output data in the 24 hours ending 08/10/12 1032 Filed Weights   08/09/12 2035  Weight: 60.782 kg (134 lb)    Exam:   General:  A+Oxx3, pleasant and cooperative  Cardiovascular: rrr  Respiratory: no wheezing, no coarse BS- clear  Abdomen: +BS, soft, NT/ND  Data Reviewed: Basic Metabolic Panel:  Lab 08/10/12 8119 08/09/12 1658  NA 137 137  K 4.6 4.2  CL 101 99  CO2 27 26  GLUCOSE 175* 167*  BUN 16 17  CREATININE 0.78 0.82  CALCIUM 9.6 9.7  MG -- --  PHOS -- --   Liver Function Tests:  Lab 08/10/12 0605  AST 14  ALT 8  ALKPHOS 75  BILITOT 0.5  PROT 6.9  ALBUMIN 3.3*   No results found for this basename: LIPASE:5,AMYLASE:5 in the last 168 hours No results found for this basename: AMMONIA:5 in the last 168 hours CBC:  Lab 08/10/12 0605 08/09/12 1658  WBC 15.8* 20.5*    NEUTROABS 15.2* 16.7*  HGB 11.4* 12.2  HCT 34.8* 37.4  MCV 92.6 92.6  PLT 208 223   Cardiac Enzymes:  Lab 08/09/12 1658  CKTOTAL --  CKMB --  CKMBINDEX --  TROPONINI <0.30   BNP (last 3 results) No results found for this basename: PROBNP:3 in the last 8760 hours CBG:  Lab 08/10/12 0731 08/09/12 2025  GLUCAP 167* 171*    No results found for this or any previous visit (from the past 240 hour(s)).   Studies: Dg Chest Port 1 View  08/09/2012  *RADIOLOGY REPORT*  Clinical Data: Shortness of breath.  COPD.  PORTABLE CHEST - 1 VIEW  Comparison: PA and lateral chest 01/27/2012 and 09/19/2011.  Findings: The chest is hyperexpanded with attenuation of the pulmonary vasculature.  No focal airspace disease or effusion. Heart size is normal.  No pneumothorax.  Degenerative change about the shoulders and old right rib fractures are noted.  IMPRESSION: No acute abnormality.  Findings consistent with COPD.  Original Report Authenticated By: Bernadene Bell. D'ALESSIO, M.D.    Scheduled Meds:   . albuterol  2.5 mg Nebulization Q6H  . albuterol      . budesonide  0.5 mg Nebulization BID  . enoxaparin (LOVENOX) injection  40 mg Subcutaneous Q24H  . insulin aspart  0-9 Units Subcutaneous TID WC  . ipratropium      .  ipratropium  0.5 mg Nebulization Q6H  . levofloxacin  500 mg Oral Daily  . methocarbamol  1,000 mg Oral QHS  . methylPREDNISolone (SOLU-MEDROL) injection  125 mg Intravenous Once  . metoprolol succinate  25 mg Oral Daily  . pantoprazole  80 mg Oral BID  . sodium chloride  3 mL Intravenous Q12H  . sodium chloride  3 mL Intravenous Q12H  . DISCONTD: sodium chloride   Intravenous STAT  . DISCONTD: ipratropium  0.5 mg Nebulization Q6H   Continuous Infusions:   Principal Problem:  *COPD with acute exacerbation Active Problems:  DIABETES MELLITUS, TYPE II  Leucocytosis    Time spent: 25    Marlin Canary  [facility] Pager [phone]  08/10/2012, 10:32 AM  LOS: 1 day

## 2012-08-11 DIAGNOSIS — K219 Gastro-esophageal reflux disease without esophagitis: Secondary | ICD-10-CM

## 2012-08-11 DIAGNOSIS — J449 Chronic obstructive pulmonary disease, unspecified: Secondary | ICD-10-CM

## 2012-08-11 DIAGNOSIS — D649 Anemia, unspecified: Secondary | ICD-10-CM

## 2012-08-11 DIAGNOSIS — R071 Chest pain on breathing: Secondary | ICD-10-CM

## 2012-08-11 LAB — CBC
Hemoglobin: 11 g/dL — ABNORMAL LOW (ref 12.0–15.0)
Platelets: 271 10*3/uL (ref 150–400)
RBC: 3.73 MIL/uL — ABNORMAL LOW (ref 3.87–5.11)
WBC: 17 10*3/uL — ABNORMAL HIGH (ref 4.0–10.5)

## 2012-08-11 MED ORDER — LEVOFLOXACIN 500 MG PO TABS: 500.0000 mg | ORAL_TABLET | Freq: Every day | ORAL | Status: DC

## 2012-08-11 MED ORDER — PREDNISONE (PAK) 10 MG PO TABS: ORAL_TABLET | ORAL | Status: DC

## 2012-08-11 NOTE — Discharge Summary (Signed)
Physician Discharge Summary  Patient ID: Dominique Clark MRN: [id number] DOB/AGE: March 06, 1941 71 y.o.  Admit date: 08/09/2012 Discharge date: 08/11/2012  Primary Care Physician:  Crawford Givens, MD  Discharge Diagnoses:   Present on Admission:  .COPD with acute exacerbation Acute hypoxic respiratory failure on admission: improved  .DIABETES MELLITUS, TYPE II .Leucocytosis  Consults:  None  Discharge Medications: Medication List  As of 08/11/2012 10:28 AM   TAKE these medications         acetaminophen 325 MG tablet   Commonly known as: TYLENOL   Take 325 mg by mouth 3 (three) times daily.      albuterol 108 (90 BASE) MCG/ACT inhaler   Commonly known as: PROVENTIL HFA;VENTOLIN HFA   Inhale 1-2 puffs into the lungs every 4 (four) hours as needed for wheezing.      cholecalciferol 1000 UNITS tablet   Commonly known as: VITAMIN D   Take 1,000 Units by mouth daily. Take one tablet by mouth 2 times daily      esomeprazole 40 MG capsule   Commonly known as: NEXIUM   Take 1 capsule (40 mg total) by mouth 2 (two) times daily.      Fluticasone-Salmeterol 250-50 MCG/DOSE Aepb   Commonly known as: ADVAIR   1 inhalation two times a day      guaiFENesin 600 MG 12 hr tablet   Commonly known as: MUCINEX   Take 1,200 mg by mouth 2 (two) times daily as needed.      levofloxacin 500 MG tablet   Commonly known as: LEVAQUIN   Take 1 tablet (500 mg total) by mouth daily. X 5 days      meloxicam 15 MG tablet   Commonly known as: MOBIC   Take 15 mg by mouth daily.      metFORMIN 500 MG tablet   Commonly known as: GLUCOPHAGE   Take 1 tablet (500 mg total) by mouth 2 (two) times daily with a meal.      methocarbamol 500 MG tablet   Commonly known as: ROBAXIN   Take 1,000 mg by mouth at bedtime.      metoprolol succinate 25 MG 24 hr tablet   Commonly known as: TOPROL-XL   Take 1 tablet (25 mg total) by mouth daily.      predniSONE 10 MG tablet   Commonly known as: STERAPRED UNI-PAK   Prednisone dosing: Take  Prednisone 40mg  (4 tabs) x 3 days, then taper to 30mg  (3 tabs) x 3 days, then 20mg  (2 tabs) x 3days, then 10mg  (1 tab) x 3days, then OFF.    Dispense:  30 tabs, refills: None      traMADol 50 MG tablet   Commonly known as: ULTRAM   Take 50-100 mg by mouth every 4 (four) hours as needed. For pain             Brief H and P: For complete details please refer to admission H and P, but in brief 71 year-old female with known history of COPD and previous history of tobacco abuse presented with shortness of breath over the last 3-4 days with cough. Since the symptoms did not get better she had gone to her PCPs office and was found to be hypoxic and wheezing and was referred to the ER. In the ER chest x-ray did not reveal any infiltrates. Patient was given nebulizer and steroids after which her shortness of breath has improved. Patient was admitted for further management of COPD exacerbation.  Hospital Course:  COPD with acute exacerbation: Patient was admitted and placed on scheduled nebulizer treatments, Pulmicort and antibiotics. Patient did receive IV Solu-Medrol. Patient was significantly wheezing at the time of admission with hypoxia. 2-D echocardiogram was obtained which showed EF of 60-65% with no regional wall motion abnormalities and diastolic function parameters were normal. Home O2 evaluation was done at the time of discharge and patient has significantly improved and maintain O2 sats above 90% with ambulation.   Active Problems:  DIABETES MELLITUS, TYPE II: Stable patient was placed on sliding scale insulin patient   Leucocytosis: Likely scheduled to steroids and acute bronchitis   Day of Discharge BP 114/64  Pulse 85  Temp 98.5 F (36.9 C) (Oral)  Resp 18  Ht 5\' 7"  (1.702 m)  Wt 60.782 kg (134 lb)  BMI 20.99 kg/m2  SpO2 98%  Physical Exam: General: Alert and awake oriented x3 not in any acute distress. HEENT: anicteric sclera, pupils reactive  to light and accommodation CVS: S1-S2 clear no murmur rubs or gallops Chest: clear to auscultation bilaterally, no wheezing rales or rhonchi Abdomen: soft nontender, nondistended, normal bowel sounds, no organomegaly Extremities: no cyanosis, clubbing or edema noted bilaterally Neuro: Cranial nerves II-XII intact, no focal neurological deficits   The results of significant diagnostics from this hospitalization (including imaging, microbiology, ancillary and laboratory) are listed below for reference.    LAB RESULTS: Basic Metabolic Panel:  Lab 08/10/12 1610 08/09/12 1658  NA 137 137  K 4.6 4.2  CL 101 99  CO2 27 26  GLUCOSE 175* 167*  BUN 16 17  CREATININE 0.78 0.82  CALCIUM 9.6 9.7  MG -- --  PHOS -- --   Liver Function Tests:  Lab 08/10/12 0605  AST 14  ALT 8  ALKPHOS 75  BILITOT 0.5  PROT 6.9  ALBUMIN 3.3*   No results found for this basename: LIPASE:2,AMYLASE:2 in the last 168 hours No results found for this basename: AMMONIA:2 in the last 168 hours CBC:  Lab 08/11/12 0615 08/10/12 0605  WBC 17.0* 15.8*  NEUTROABS -- 15.2*  HGB 11.0* 11.4*  HCT 34.2* 34.8*  MCV 91.7 --  PLT 271 208   Cardiac Enzymes:  Lab 08/09/12 1658  CKTOTAL --  CKMB --  CKMBINDEX --  TROPONINI <0.30   BNP: No components found with this basename: POCBNP:2 CBG:  Lab 08/11/12 0735 08/10/12 2003  GLUCAP 104* 187*    Significant Diagnostic Studies:  Dg Chest Port 1 View  08/09/2012  *RADIOLOGY REPORT*  Clinical Data: Shortness of breath.  COPD.  PORTABLE CHEST - 1 VIEW  Comparison: PA and lateral chest 01/27/2012 and 09/19/2011.  Findings: The chest is hyperexpanded with attenuation of the pulmonary vasculature.  No focal airspace disease or effusion. Heart size is normal.  No pneumothorax.  Degenerative change about the shoulders and old right rib fractures are noted.  IMPRESSION: No acute abnormality.  Findings consistent with COPD.  Original Report Authenticated By: Bernadene Bell.  Maricela Curet, M.D.     Disposition and Follow-up: Discharge Orders    Future Orders Please Complete By Expires   Diet Carb Modified      Increase activity slowly          DISPOSITION:Home DIET:  carb modified diet  ACTIVITY:As tolerated  DISCHARGE FOLLOW-UP Follow-up Information    Follow up with Crawford Givens, MD. Schedule an appointment as soon as possible for a visit in 10 days. (for hospital followup)    Contact information:   [address] [employer] [place] [address] [phone]          Time spent on Discharge:  45 minutes   Signed:   RAI,RIPUDEEP M.D. [facility] 08/11/2012, 10:28 AM Pager: [phone]  If 7PM-7AM, please contact night-coverage [email or web address] Password [email or web address]

## 2012-08-11 NOTE — Progress Notes (Signed)
Pt discharged to home per MD order. Pt received all discharge instructions and medication information including follow-up appointments and prescriptions.  Pt alert and oriented at discharge with no complaints of pain.  Pt ambulated on room air prior to discharge approximately 250 ft.   Pt O2 sat maintained 90 and above during ambulation.  Pt O2 at discharge 94 on RA.  Pt escorted to private vehicle via wheelchair by guest services. Efraim Kaufmann

## 2012-08-11 NOTE — Progress Notes (Signed)
Utilization review completed- retro 

## 2012-08-17 ENCOUNTER — Encounter: Payer: Self-pay | Admitting: Family Medicine

## 2012-08-17 ENCOUNTER — Telehealth: Payer: Self-pay | Admitting: *Deleted

## 2012-08-17 ENCOUNTER — Ambulatory Visit (INDEPENDENT_AMBULATORY_CARE_PROVIDER_SITE_OTHER): Payer: Medicare Other | Admitting: Family Medicine

## 2012-08-17 VITALS — BP 132/70 | HR 78 | Temp 98.0°F | Wt 135.8 lb

## 2012-08-17 DIAGNOSIS — D72829 Elevated white blood cell count, unspecified: Secondary | ICD-10-CM

## 2012-08-17 DIAGNOSIS — J441 Chronic obstructive pulmonary disease with (acute) exacerbation: Secondary | ICD-10-CM

## 2012-08-17 DIAGNOSIS — E119 Type 2 diabetes mellitus without complications: Secondary | ICD-10-CM

## 2012-08-17 LAB — CBC WITH DIFFERENTIAL/PLATELET
Basophils Relative: 0.2 % (ref 0.0–3.0)
Eosinophils Absolute: 0.1 10*3/uL (ref 0.0–0.7)
Eosinophils Relative: 1.1 % (ref 0.0–5.0)
Lymphocytes Relative: 24 % (ref 12.0–46.0)
MCV: 92.5 fl (ref 78.0–100.0)
Monocytes Absolute: 0.7 10*3/uL (ref 0.1–1.0)
Neutrophils Relative %: 69.4 % (ref 43.0–77.0)
Platelets: 320 10*3/uL (ref 150.0–400.0)
RBC: 3.97 Mil/uL (ref 3.87–5.11)
WBC: 13.5 10*3/uL — ABNORMAL HIGH (ref 4.5–10.5)

## 2012-08-17 LAB — HEMOGLOBIN A1C: Hgb A1c MFr Bld: 6.1 % (ref 4.6–6.5)

## 2012-08-17 NOTE — Telephone Encounter (Signed)
Patient is asking if it would be reasonable for her to get one of the "breathing machines" because she feels that if she had that to help her get the phlegm/infection up, she might not have gotten as sick.

## 2012-08-17 NOTE — Progress Notes (Signed)
Admitted with COPD exacerbation.  Hospital recs reviewed.  Done with abx, is finishing steroid taper.  Steroids have elevated sugar some but this is better (~130) since coming home.  She feels much better in meantime.  Cough is better, minimal sputum production.  No fevers.  SOB is much improved, back to baseline now.  She's been walking more since coming home from hospital.   Meds, vitals, and allergies reviewed.   ROS: See HPI.  Otherwise, noncontributory.  nad ncat Mmm rrr Ctab, no wheeze or cough No focal dec in bs abd soft, not ttp Ext w/o edema

## 2012-08-17 NOTE — Assessment & Plan Note (Signed)
Much improved, continue steroid taper and f/u prn.  She agrees.

## 2012-08-17 NOTE — Assessment & Plan Note (Signed)
See notes on recheck CBC.

## 2012-08-17 NOTE — Patient Instructions (Addendum)
Take care.  Go to the lab on the way out.  We'll contact you with your lab report.

## 2012-08-17 NOTE — Telephone Encounter (Signed)
I would use the inhalers that she has and not use a neb.  It is likely that gradually walking more and building exercise tolerance would help as much as anything during and after her recovery.

## 2012-08-18 NOTE — Telephone Encounter (Signed)
Patient notified as instructed by telephone. 

## 2012-08-27 ENCOUNTER — Encounter: Payer: Self-pay | Admitting: Family Medicine

## 2012-08-27 ENCOUNTER — Ambulatory Visit (INDEPENDENT_AMBULATORY_CARE_PROVIDER_SITE_OTHER): Payer: Medicare Other | Admitting: Family Medicine

## 2012-08-27 VITALS — BP 112/70 | HR 92 | Temp 97.8°F | Wt 135.0 lb

## 2012-08-27 DIAGNOSIS — J069 Acute upper respiratory infection, unspecified: Secondary | ICD-10-CM

## 2012-08-27 NOTE — Assessment & Plan Note (Signed)
Likely viral, nontoxic, not dehydrated.  Diarrhea resolved, lungs ctab and okay for outpatient f/u.  Continue mucinex and f/u prn.  She agrees.

## 2012-08-27 NOTE — Progress Notes (Signed)
Finished abx and prednisone.  Sugar was improved after getting off prednisone.  Was feeling better until 2 days ago.  Started with diarrhea (resolved now). Then had cough and tight chest.  Some sputum, mild.  Taking mucinex and that helps with sputum production.  No fevers.  Frontal HA.  R ear sore.  No ST.  She isn't hypoxic as she was with prev COPD exacerbation.  Meds, vitals, and allergies reviewed.   ROS: See HPI.  Otherwise, noncontributory.  GEN: nad, alert and oriented HEENT: mucous membranes moist, tm w/o erythema, nasal exam w/o erythema, clear discharge noted,  OP with cobblestoning, max sinuses minimally ttp NECK: supple w/o LA CV: rrr.   PULM: ctab, no inc wob EXT: no edema SKIN: no acute rash

## 2012-08-27 NOTE — Patient Instructions (Addendum)
Take either tramadol or tylenol for the headaches.  Take guaifenesin/mucinex for the cough. Drink plenty of fluids.  Get some rest.  This should get better. Take care.

## 2012-09-28 ENCOUNTER — Telehealth: Payer: Self-pay | Admitting: *Deleted

## 2012-09-28 MED ORDER — OMEPRAZOLE 40 MG PO CPDR: 40.0000 mg | DELAYED_RELEASE_CAPSULE | Freq: Two times a day (BID) | ORAL | Status: DC

## 2012-09-28 NOTE — Telephone Encounter (Signed)
[name] is $218.10 because she is in the donut hole/coverage gap with her Medicare.  She would like to try something less expensive, i.e. Prilosec or Protonix that has a generic.  Please send a new Rx if this is okay.

## 2012-09-28 NOTE — Telephone Encounter (Signed)
Change to prilosec.  rx sent.

## 2012-10-14 ENCOUNTER — Encounter: Payer: Self-pay | Admitting: Family Medicine

## 2012-10-14 ENCOUNTER — Ambulatory Visit (INDEPENDENT_AMBULATORY_CARE_PROVIDER_SITE_OTHER): Payer: Medicare Other | Admitting: Family Medicine

## 2012-10-14 VITALS — BP 122/64 | HR 79 | Temp 97.7°F | Wt 137.0 lb

## 2012-10-14 DIAGNOSIS — J441 Chronic obstructive pulmonary disease with (acute) exacerbation: Secondary | ICD-10-CM

## 2012-10-14 MED ORDER — AMOXICILLIN-POT CLAVULANATE 875-125 MG PO TABS: 1.0000 | ORAL_TABLET | Freq: Two times a day (BID) | ORAL | Status: DC

## 2012-10-14 MED ORDER — PREDNISONE 20 MG PO TABS: ORAL_TABLET | ORAL | Status: DC

## 2012-10-14 NOTE — Progress Notes (Signed)
Minimal cough until last night.  Now with discolored sputum, inc in cough.  Using advair. Using SABA slightly more often, used last night.  Some wheeze. No fevers.  She isn't SOB at visit, but is slightly more SOB walking- noted last night- improved with SABA last night.  Hadn't gotten a flu shot yet.  Sugar has been controlled per patient.  Prev admitted with COPD exacerbation in 2013.   Meds, vitals, and allergies reviewed.   ROS: See HPI.  Otherwise, noncontributory.  nad ncat Tm wnl Nasal and OP exam wnl Neck supple, no LA rrr R>L rhonchi, inc air movement after SABA use.  Coached on inhaler technique.  Ext well perfused.

## 2012-10-14 NOTE — Patient Instructions (Addendum)
Start the antibiotics today, use the advair inhaler twice a day.  Use the other inhaler up to 4 times a day.  Take prednisone with food.  This should gradually improve.

## 2012-10-15 NOTE — Assessment & Plan Note (Signed)
Start steroids, augmentin and continue to use inhalers. Technique reviewed, improved with coaching.  She agrees with plan.  Steroid caution given, esp re: hyperglycemia.  F/u prn.  Nontoxic.

## 2012-11-17 ENCOUNTER — Other Ambulatory Visit: Payer: Self-pay | Admitting: *Deleted

## 2012-11-17 MED ORDER — METOPROLOL SUCCINATE ER 25 MG PO TB24: 25.0000 mg | ORAL_TABLET | Freq: Every day | ORAL | Status: DC

## 2012-12-21 ENCOUNTER — Other Ambulatory Visit: Payer: Self-pay | Admitting: *Deleted

## 2012-12-21 MED ORDER — METFORMIN HCL 500 MG PO TABS: 500.0000 mg | ORAL_TABLET | Freq: Two times a day (BID) | ORAL | Status: DC

## 2012-12-27 ENCOUNTER — Ambulatory Visit: Payer: Medicare Other | Admitting: Family Medicine

## 2012-12-31 ENCOUNTER — Encounter: Payer: Self-pay | Admitting: Family Medicine

## 2012-12-31 ENCOUNTER — Ambulatory Visit (INDEPENDENT_AMBULATORY_CARE_PROVIDER_SITE_OTHER): Payer: Medicare Other | Admitting: Family Medicine

## 2012-12-31 VITALS — BP 122/64 | HR 79 | Temp 97.7°F | Wt 138.0 lb

## 2012-12-31 DIAGNOSIS — J449 Chronic obstructive pulmonary disease, unspecified: Secondary | ICD-10-CM

## 2012-12-31 MED ORDER — TRAMADOL HCL 50 MG PO TABS: 50.0000 mg | ORAL_TABLET | Freq: Three times a day (TID) | ORAL | Status: DC | PRN

## 2012-12-31 MED ORDER — DOXYCYCLINE HYCLATE 100 MG PO TABS: 100.0000 mg | ORAL_TABLET | Freq: Two times a day (BID) | ORAL | Status: DC

## 2012-12-31 NOTE — Patient Instructions (Addendum)
Send me the list of the alternative muscle relaxers that your plan will cover.   Keep taking the mucinex and drink plenty of fluids. Take the albuterol inhaler twice a day for a few day.  If you aren't improving, then start the antibiotics.   Take care.

## 2012-12-31 NOTE — Progress Notes (Signed)
Already had a flu shot.    She was sick last week, but then felt better.  Was improved until yesterday when she was coughing more.  She was more SOB.  She didn't know if she could attribute it to the weather change.  She feels better today.  Some light green sputum.  Some more wheeze noted by patient, noted in the upper chest.  No fevers.  She is using albuterol once a day, but this is at baseline and not increased.  She wanted to be checked given her history.   Meds, vitals, and allergies reviewed.   ROS: See HPI.  Otherwise, noncontributory.  nad ncat Mmm Neck supple, no LA rrr No focal dec in BS, no wheeze, no inc in wob Speaking in complete sentences w/o difficulty Not SOB with ambulation.  Ext w/o edema No cyanosis.

## 2013-01-02 NOTE — Assessment & Plan Note (Signed)
She may self resolve without abx use.  I think this is likely.  Given rx to hold, use SABA BID for the next few days to help with sputum clearance and f/u prn.  She is nontoxic.  She agrees.

## 2013-02-23 ENCOUNTER — Telehealth: Payer: Self-pay | Admitting: Family Medicine

## 2013-02-23 NOTE — Telephone Encounter (Signed)
Spoke with pt; since using her breathing med pt is feeling a lot better and can wait for appt 02/24/13 at 11:45 am. Advised pt if she needs to be seen Dr Para March can see her at 1:30 pm today and pt said no she would keep appt tomorrow. Advised if condition changed or worsened tonight can go to UC if needed. Pt voiced understanding.

## 2013-02-23 NOTE — Telephone Encounter (Signed)
Please call her back.  If she feels like she really needs to be seen today, then please get her on the schedule (see about coming in to be seen at 1:30 if possible).

## 2013-02-23 NOTE — Telephone Encounter (Signed)
Thank you for your effort

## 2013-02-23 NOTE — Telephone Encounter (Signed)
Patient Information:  Caller Name: Naureen  Phone: [phone]  Patient: Dominique Clark  Gender: Female  DOB: 1941-02-21  Age: 72 Years  PCP: Crawford Givens Clelia Croft) [facility])  Office Follow Up:  Does the office need to follow up with this patient?: Yes  Instructions For The Office: Patent has appointment for tomorrow.  No emergent symptoms.  Is a COPD patient, but not in any distress.  Follow up with patient only if based on history needs sooner intervention.  RN Note:  Has history of COPD.  States has wheezing in throat, but is coughing up green mucus.  Nasal discharge is clear to none.  Patient describes breathing as more short of breath with exertion. Was coughing much more than usual until medications taken.  Improved cough and SOB with taking of her medications.  Is afebrile.  Is eating and drinking like normal.  Is urinating frequently.  Patient wanted to see no one but Dr. Geraldo Docker an appointment to be seen 02/24/13 at 11:45 with Dr. Para March.  Patient denied emergent symptoms.  She just wants to stay on top of her condition to avoid more expensive treatments like hospitalization.  Care advice given with caller demonstrating understanding.  Symptoms  Reason For Call & Symptoms: COPD patient with productive green mucus  Reviewed Health History In EMR: Yes  Reviewed Medications In EMR: Yes  Reviewed Allergies In EMR: Yes  Reviewed Surgeries / Procedures: Yes  Date of Onset of Symptoms: 02/22/2013  Guideline(s) Used:  Colds  Disposition Per Guideline:   Home Care  Reason For Disposition Reached:   Colds with no complications  Advice Given:  Reassurance  Colds are very common and may make you feel uncomfortable.  For a Runny Nose With Profuse Discharge:   Nasal mucus and discharge helps to wash viruses and bacteria out of the nose and sinuses.  For a Stuffy Nose - Use Nasal Washes:  Introduction: Saline (salt water) nasal irrigation (nasal wash) is an effective and  simple home remedy for treating stuffy nose and sinus congestion. The nose can be irrigated by pouring, spraying, or squirting salt water into the nose and then letting it run back out.  Humidifier:  If the air in your home is dry, use a cool-mist humidifier  Treatment for Associated Symptoms of Colds:  Sore throat: Try throat lozenges, hard candy, or warm chicken broth.  Cough: Use cough drops.  Call Back If:  Difficulty breathing occurs  Nasal discharge lasts more than 10 days  Cough lasts more than 3 weeks  You become worse

## 2013-02-23 NOTE — Telephone Encounter (Signed)
Pt has COPD and is concerned b/c she's wheezing and coughing so she thinks she needs to be seen today.  She says she's also spitting up "green stuff". Also says her SOB is a [place] worse today than usual and has used her inhaler for today. I sent her to the triage nurse. Can you accommodate her today? Thank you.

## 2013-02-24 ENCOUNTER — Encounter: Payer: Self-pay | Admitting: Family Medicine

## 2013-02-24 ENCOUNTER — Ambulatory Visit (INDEPENDENT_AMBULATORY_CARE_PROVIDER_SITE_OTHER): Payer: Medicare Other | Admitting: Family Medicine

## 2013-02-24 VITALS — BP 122/72 | HR 76 | Temp 98.5°F | Wt 138.0 lb

## 2013-02-24 DIAGNOSIS — E119 Type 2 diabetes mellitus without complications: Secondary | ICD-10-CM

## 2013-02-24 DIAGNOSIS — J441 Chronic obstructive pulmonary disease with (acute) exacerbation: Secondary | ICD-10-CM

## 2013-02-24 LAB — HEMOGLOBIN A1C: Hgb A1c MFr Bld: 5.7 % (ref 4.6–6.5)

## 2013-02-24 MED ORDER — ALBUTEROL SULFATE HFA 108 (90 BASE) MCG/ACT IN AERS: 1.0000 | INHALATION_SPRAY | RESPIRATORY_TRACT | Status: DC | PRN

## 2013-02-24 MED ORDER — AZITHROMYCIN 250 MG PO TABS: ORAL_TABLET | ORAL | Status: DC

## 2013-02-24 NOTE — Assessment & Plan Note (Signed)
Controlled by A1c, continue as is.

## 2013-02-24 NOTE — Patient Instructions (Addendum)
Go to the lab on the way out.  We'll contact you with your lab report.  Keep using your advair twice a day and the albuterol as needed.  Start the antibiotics today.  When you are feeling better, call back and schedule a nurse visit for spirometry (lung function tests). Take care.  Glad to see you.

## 2013-02-24 NOTE — Progress Notes (Signed)
She waited after the last OV and didn't immediately fill the doxy.  She didn't fully recover and then filled the rx.  She did recover but had some GI upset on the medicine, some nausea w/o vomiting.  That resolved after finishing the doxy.    She had a return of sx in the last 1-2 days.  Still on mucinex.  Using her inhalers.  She has a lot of upper airway noise "with the wheeze in my throat."  Some more cough and green sputum recently.  She clear rhinorrhea. She thinks she likely has some postnasal gtt contributing.  The inhalers help with the cough, last used SABA this AM.   Meds, vitals, and allergies reviewed.   ROS: See HPI.  Otherwise, noncontributory.  GEN: nad, alert and oriented HEENT: mucous membranes moist, tm w/o erythema, nasal exam w/o erythema, clear discharge noted,  OP with cobblestoning NECK: supple w/o LA, upper airway noise noted CV: rrr.   PULM: scant exp wheeze but o/w ctab, no inc wob EXT: no edema SKIN: no acute rash

## 2013-02-24 NOTE — Assessment & Plan Note (Signed)
Presumed with inc in sputum and cough.   Keep using advair twice a day and the albuterol as needed.  Start the zmax today.  When you are feeling better, call back and schedule a nurse visit for spirometry (lung function tests). Nontoxic.  She agrees.

## 2013-03-30 ENCOUNTER — Ambulatory Visit (INDEPENDENT_AMBULATORY_CARE_PROVIDER_SITE_OTHER): Payer: Medicare Other | Admitting: Family Medicine

## 2013-03-30 ENCOUNTER — Encounter: Payer: Self-pay | Admitting: Family Medicine

## 2013-03-30 VITALS — BP 116/72 | HR 76 | Temp 98.1°F | Wt 138.0 lb

## 2013-03-30 DIAGNOSIS — J3489 Other specified disorders of nose and nasal sinuses: Secondary | ICD-10-CM

## 2013-03-30 MED ORDER — AZITHROMYCIN 250 MG PO TABS: ORAL_TABLET | ORAL | Status: DC

## 2013-03-30 MED ORDER — FLUTICASONE PROPIONATE 50 MCG/ACT NA SUSP: 2.0000 | Freq: Every day | NASAL | Status: DC

## 2013-03-30 MED ORDER — PREDNISONE 10 MG PO TABS: ORAL_TABLET | ORAL | Status: DC

## 2013-03-30 NOTE — Progress Notes (Signed)
Recently with more SABA use, usually with minimal SABA use.  SABA use has helped some recently with wheezing.  She has some yellow nasal discharge.  Sx started about 5 days ago.  She has been stuffy.  No fevers.  She is slightly more SOB than normal.  Some cough.  Some sputum, yellow. Frontal facial pain.  She has a ST in AM, likely from post nasal gtt.  "It's like somebody poured concrete in my face."  Pollen levels have increased locally recently.    Meds, vitals, and allergies reviewed.   ROS: See HPI.  Otherwise, noncontributory.  GEN: nad, alert and oriented HEENT: mucous membranes moist, tm w/o erythema, nasal exam w/o erythema, clear discharge noted,  OP with cobblestoning NECK: supple w/o LA, she does have some mild upper airway noise CV: rrr.   PULM: ctab, no inc wob EXT: no edema SKIN: no acute rash

## 2013-03-30 NOTE — Patient Instructions (Addendum)
Start the flonase today and take the prednisone once a day.  If you aren't improved, then notify the clinic.  Take the prednisone with food.  If you aren't improving, then start the antibiotics.   Take care.

## 2013-03-31 DIAGNOSIS — J3489 Other specified disorders of nose and nasal sinuses: Secondary | ICD-10-CM | POA: Insufficient documentation

## 2013-03-31 NOTE — Assessment & Plan Note (Signed)
Looks to be allergic with season change, not infectious.  Would use flonase and brief course of prednisone with steroid caution.  If not improving, then start zmax though I doubt she'll need it; hold rx in meantime.  She agrees.  F/u prn.

## 2013-04-04 ENCOUNTER — Other Ambulatory Visit: Payer: Self-pay | Admitting: *Deleted

## 2013-04-04 MED ORDER — OMEPRAZOLE 40 MG PO CPDR: 40.0000 mg | DELAYED_RELEASE_CAPSULE | Freq: Two times a day (BID) | ORAL | Status: DC

## 2013-04-21 ENCOUNTER — Ambulatory Visit (INDEPENDENT_AMBULATORY_CARE_PROVIDER_SITE_OTHER): Payer: Medicare Other | Admitting: Family Medicine

## 2013-04-21 ENCOUNTER — Ambulatory Visit (INDEPENDENT_AMBULATORY_CARE_PROVIDER_SITE_OTHER)
Admission: RE | Admit: 2013-04-21 | Discharge: 2013-04-21 | Disposition: A | Discharge status: Home / Self Care | Payer: Medicare Other | Source: Ambulatory Visit | Attending: Family Medicine | Admitting: Family Medicine

## 2013-04-21 ENCOUNTER — Encounter: Payer: Self-pay | Admitting: Family Medicine

## 2013-04-21 VITALS — BP 124/76 | HR 88 | Temp 97.3°F | Wt 136.8 lb

## 2013-04-21 DIAGNOSIS — R059 Cough, unspecified: Secondary | ICD-10-CM

## 2013-04-21 DIAGNOSIS — R05 Cough: Secondary | ICD-10-CM

## 2013-04-21 DIAGNOSIS — J441 Chronic obstructive pulmonary disease with (acute) exacerbation: Secondary | ICD-10-CM

## 2013-04-21 MED ORDER — BUDESONIDE-FORMOTEROL FUMARATE 160-4.5 MCG/ACT IN AERO: 2.0000 | INHALATION_SPRAY | Freq: Two times a day (BID) | RESPIRATORY_TRACT | Status: DC

## 2013-04-21 MED ORDER — PREDNISONE 10 MG PO TABS: ORAL_TABLET | ORAL | Status: DC

## 2013-04-21 NOTE — Patient Instructions (Addendum)
Take the prednisone with food and change from advair to symbicort.  Update me next week.  We may need to get you to see pulmonary.   We'll contact you with your lab report.

## 2013-04-21 NOTE — Progress Notes (Signed)
Seen early 4/14- some improvement but not all the way resolved with prednisone.  She waited and then filled zmax as instructed, but that didn't seen to help at all. Still with noisy breathing and cough.  She has coughed so much to try to clear out sputum that her chest is sore.  She has sputum- yellow to white, usually thick or foamy.  No fevers.  She can get a deep breath. The inhalers do help but only for a short duration.  She's had about 6 times with abx treatment in the last 2 years.  We hadn't sent her for PFTs recently as she wasn't back to baseline.  "And I had taken the tests before at work and I always flunked."  She hadn't seen pulmonary in years.  Her facial pain is better.    Meds, vitals, and allergies reviewed.   ROS: See HPI.  Otherwise, noncontributory.  GEN: nad, alert and oriented  HEENT: mucous membranes moist, tm w/o erythema, nasal exam w/o erythema, no discharge noted, OP with no cobblestoning  NECK: supple w/o LA, she does have some upper airway noise  CV: rrr.  PULM: ctab except for uan noted, no inc wob but prolonged exp phase noted EXT: no edema  SKIN: no acute rash

## 2013-04-22 NOTE — Assessment & Plan Note (Signed)
cxr w/o pna. Likely not infectious, d/w pt about options. Would use another burst of pred, GI cautions given. Change to symbicort. If not improved, likely refer to pulm. If improved, then consider PFTs. She agrees. She'll update me. >25 min spent with face to face with patient, >50% counseling and/or coordinating care.

## 2013-05-03 ENCOUNTER — Other Ambulatory Visit: Payer: Self-pay | Admitting: *Deleted

## 2013-05-03 NOTE — Telephone Encounter (Signed)
Faxed refill request.  Last filled 04/02/13.  Please advise.

## 2013-05-04 MED ORDER — MELOXICAM 15 MG PO TABS: 15.0000 mg | ORAL_TABLET | Freq: Every day | ORAL | Status: DC

## 2013-05-04 NOTE — Telephone Encounter (Signed)
Sent!

## 2013-05-16 ENCOUNTER — Other Ambulatory Visit: Payer: Self-pay | Admitting: *Deleted

## 2013-05-16 MED ORDER — METFORMIN HCL 500 MG PO TABS: 500.0000 mg | ORAL_TABLET | Freq: Two times a day (BID) | ORAL | Status: DC

## 2013-05-30 ENCOUNTER — Ambulatory Visit (INDEPENDENT_AMBULATORY_CARE_PROVIDER_SITE_OTHER): Payer: Medicare Other | Admitting: Family Medicine

## 2013-05-30 ENCOUNTER — Encounter: Payer: Self-pay | Admitting: Family Medicine

## 2013-05-30 VITALS — BP 112/66 | HR 80 | Temp 97.5°F | Wt 136.5 lb

## 2013-05-30 DIAGNOSIS — L255 Unspecified contact dermatitis due to plants, except food: Secondary | ICD-10-CM

## 2013-05-30 DIAGNOSIS — J449 Chronic obstructive pulmonary disease, unspecified: Secondary | ICD-10-CM

## 2013-05-30 DIAGNOSIS — L237 Allergic contact dermatitis due to plants, except food: Secondary | ICD-10-CM

## 2013-05-30 MED ORDER — TRIAMCINOLONE ACETONIDE 0.5 % EX CREA: TOPICAL_CREAM | Freq: Three times a day (TID) | CUTANEOUS | Status: DC | PRN

## 2013-05-30 NOTE — Progress Notes (Signed)
She likely had a poison oak exposure.  Itchy spots on the neck and and scalp.  She was wearing gloves and likely put her hands to her face.    COPD: She feels better on the symbicort.  Her dyspnea is much improved.  She feels well o/w.   Meds, vitals, and allergies reviewed.   ROS: See HPI.  Otherwise, noncontributory.  nad ncat rrr Ctab, no wheeze B neck and L temple with blanching irregular small patches of erythema w/o fluctuant mass

## 2013-05-30 NOTE — Assessment & Plan Note (Signed)
Presumed, use topical TAC and f/u prn.  Topical steroid caution given.

## 2013-05-30 NOTE — Assessment & Plan Note (Signed)
Sx improved on symbicort, continue as is. Defer pulm referral for now per patient request.

## 2013-05-30 NOTE — Patient Instructions (Addendum)
Use the cream on the itchy spots.  Use the smallest amount possible.  This should gradually improve.   Take care.

## 2013-06-06 ENCOUNTER — Other Ambulatory Visit: Payer: Self-pay | Admitting: *Deleted

## 2013-06-06 MED ORDER — METOPROLOL SUCCINATE ER 25 MG PO TB24: 25.0000 mg | ORAL_TABLET | Freq: Every day | ORAL | Status: DC

## 2013-08-12 ENCOUNTER — Other Ambulatory Visit: Payer: Self-pay

## 2013-08-12 MED ORDER — BUDESONIDE-FORMOTEROL FUMARATE 160-4.5 MCG/ACT IN AERO: 2.0000 | INHALATION_SPRAY | Freq: Two times a day (BID) | RESPIRATORY_TRACT | Status: DC

## 2013-08-12 NOTE — Telephone Encounter (Signed)
No samples, sent.

## 2013-08-12 NOTE — Telephone Encounter (Signed)
Pt left v/m requesting samples of symbicort; if no samples available pt request refill Symbicort to [facility]. Pt request cb.

## 2013-08-12 NOTE — Telephone Encounter (Signed)
Patient advised.

## 2013-08-13 ENCOUNTER — Other Ambulatory Visit: Payer: Self-pay | Admitting: Family Medicine

## 2013-08-15 NOTE — Telephone Encounter (Signed)
Electronic refill request.  Please advise. 

## 2013-08-15 NOTE — Telephone Encounter (Signed)
Please call in.  Thanks.   

## 2013-08-15 NOTE — Telephone Encounter (Signed)
Medication phoned to pharmacy.  

## 2013-08-25 ENCOUNTER — Ambulatory Visit: Payer: Medicare Other | Admitting: Family Medicine

## 2013-10-01 ENCOUNTER — Other Ambulatory Visit: Payer: Self-pay | Admitting: Family Medicine

## 2013-10-31 ENCOUNTER — Ambulatory Visit: Payer: Medicare Other | Admitting: Family Medicine

## 2013-11-01 ENCOUNTER — Encounter: Payer: Self-pay | Admitting: Family Medicine

## 2013-11-02 ENCOUNTER — Other Ambulatory Visit: Payer: Self-pay | Admitting: Family Medicine

## 2013-11-02 NOTE — Telephone Encounter (Signed)
Please call in

## 2013-11-02 NOTE — Telephone Encounter (Signed)
Last filled 08/13/2013

## 2013-11-03 NOTE — Telephone Encounter (Signed)
Rx called in as prescribed 

## 2013-11-10 ENCOUNTER — Encounter: Payer: Self-pay | Admitting: Family Medicine

## 2013-11-10 ENCOUNTER — Ambulatory Visit (INDEPENDENT_AMBULATORY_CARE_PROVIDER_SITE_OTHER): Payer: Medicare Other | Admitting: Family Medicine

## 2013-11-10 VITALS — BP 142/78 | HR 80 | Temp 97.7°F | Wt 132.0 lb

## 2013-11-10 DIAGNOSIS — J441 Chronic obstructive pulmonary disease with (acute) exacerbation: Secondary | ICD-10-CM

## 2013-11-10 MED ORDER — DOXYCYCLINE HYCLATE 100 MG PO TABS: 100.0000 mg | ORAL_TABLET | Freq: Two times a day (BID) | ORAL | Status: DC

## 2013-11-10 NOTE — Patient Instructions (Signed)
Start the antibiotics today and that should help. Drink plenty of fluids and keep using your inhalers.  Take care.  Glad to see you.

## 2013-11-10 NOTE — Progress Notes (Signed)
Pre-visit discussion using our clinic review tool. No additional management support is needed unless otherwise documented below in the visit note.  Recently with more cough and wheeze. Son has been sick.  Using inhalers at baseline. Going on for about 5 days.  Sx wax and wane.  Already had a flu shot.  No fevers.  Some sputum.  Fatigued, "my get up an go got up and left."  Meds, vitals, and allergies reviewed.   ROS: See HPI.  Otherwise, noncontributory.  GEN: nad, alert and oriented HEENT: mucous membranes moist, TM wnl, nasal and OP exam wnl NECK: supple w/o LA CV: rrr. PULM: ctab except for scant rales at the RLL, no inc wob, no wheeze ABD: soft, +bs EXT: no edema SKIN: no acute rash

## 2013-11-11 NOTE — Assessment & Plan Note (Signed)
Given her history, I would treat.  Would like to avoid steroid if possible, given lack of wheeze today we didn't start.  Start abx today and continue inhalers.  If worsening then notify us.  Okay for outpatient f/u.  She agrees.

## 2013-11-21 ENCOUNTER — Ambulatory Visit (INDEPENDENT_AMBULATORY_CARE_PROVIDER_SITE_OTHER): Payer: Medicare Other | Admitting: Family Medicine

## 2013-11-21 ENCOUNTER — Encounter: Payer: Self-pay | Admitting: Family Medicine

## 2013-11-21 VITALS — BP 110/60 | HR 86 | Temp 98.3°F | Wt 134.5 lb

## 2013-11-21 DIAGNOSIS — J441 Chronic obstructive pulmonary disease with (acute) exacerbation: Secondary | ICD-10-CM

## 2013-11-21 DIAGNOSIS — E119 Type 2 diabetes mellitus without complications: Secondary | ICD-10-CM

## 2013-11-21 MED ORDER — AZITHROMYCIN 250 MG PO TABS: ORAL_TABLET | ORAL | Status: DC

## 2013-11-21 MED ORDER — PREDNISONE 20 MG PO TABS: 20.0000 mg | ORAL_TABLET | Freq: Every day | ORAL | Status: DC

## 2013-11-21 NOTE — Progress Notes (Signed)
Pre-visit discussion using our clinic review tool. No additional management support is needed unless otherwise documented below in the visit note.  Pt took doxy, still with cough.  Wasn't using her SABA as it was old and likely out of date.  Yellow green sputum. No fevers.  Cough noted.  Some wheeze.  Not worse but not better from prev.  Due for DM2 f/u.  Hasn't checked sugar everyday (she doesn't need to, given her prev good control) but last few checks were controlled, ie ~100.    Meds, vitals, and allergies reviewed.   ROS: See HPI.  Otherwise, noncontributory.  GEN: nad, alert and oriented HEENT: mucous membranes moist, tm w/o erythema, nasal exam w/o erythema, clear discharge noted,  OP with cobblestoning NECK: supple w/o LA CV: rrr.   PULM: diffuse wheeze initially with no inc wob, improved with SABA but cough noted.  Recheck pulse ox 93%.  EXT: no edema SKIN: no acute rash

## 2013-11-21 NOTE — Patient Instructions (Signed)
Take prednisone with food and start the zithromax today.  Use the albuterol as needed.  Notify me if not improving.  Go to the lab on the way out.  We'll contact you with your lab report. Take care.

## 2013-11-21 NOTE — Assessment & Plan Note (Signed)
Change to zmax, add on pred taper with GI/DM caution and then use SABA in short run.  Nontoxic. Okay for outpatient fu.  She agrees.  Call back prn.

## 2013-11-22 ENCOUNTER — Encounter: Payer: Self-pay | Admitting: *Deleted

## 2013-11-22 LAB — HEMOGLOBIN A1C: Hgb A1c MFr Bld: 6.2 % (ref 4.6–6.5)

## 2013-11-28 ENCOUNTER — Other Ambulatory Visit: Payer: Self-pay | Admitting: Family Medicine

## 2013-11-28 DIAGNOSIS — E119 Type 2 diabetes mellitus without complications: Secondary | ICD-10-CM

## 2013-12-05 ENCOUNTER — Other Ambulatory Visit: Payer: Self-pay | Admitting: Family Medicine

## 2013-12-05 NOTE — Telephone Encounter (Signed)
Electronic refill request.  Please advise. 

## 2013-12-06 NOTE — Telephone Encounter (Signed)
Please call in

## 2013-12-06 NOTE — Telephone Encounter (Signed)
Medication phoned to pharmacy.  

## 2013-12-09 ENCOUNTER — Other Ambulatory Visit (INDEPENDENT_AMBULATORY_CARE_PROVIDER_SITE_OTHER): Payer: Medicare Other

## 2013-12-09 DIAGNOSIS — E119 Type 2 diabetes mellitus without complications: Secondary | ICD-10-CM

## 2013-12-09 LAB — LIPID PANEL
Cholesterol: 222 mg/dL — ABNORMAL HIGH (ref 0–200)
Total CHOL/HDL Ratio: 4
Triglycerides: 142 mg/dL (ref 0.0–149.0)

## 2013-12-09 LAB — COMPREHENSIVE METABOLIC PANEL
AST: 21 U/L (ref 0–37)
Albumin: 4 g/dL (ref 3.5–5.2)
CO2: 29 mEq/L (ref 19–32)
Chloride: 105 mEq/L (ref 96–112)
GFR: 59.07 mL/min — ABNORMAL LOW (ref >60.00)
Glucose, Bld: 90 mg/dL (ref 70–99)
Potassium: 4.7 mEq/L (ref 3.5–5.1)
Sodium: 138 mEq/L (ref 135–145)
Total Protein: 7.2 g/dL (ref 6.0–8.3)

## 2013-12-09 LAB — LDL CHOLESTEROL, DIRECT: Direct LDL: 145.6 mg/dL

## 2013-12-09 LAB — MICROALBUMIN / CREATININE URINE RATIO
Creatinine,U: 159.8 mg/dL
Microalb Creat Ratio: 0.4 mg/g (ref 0.0–30.0)

## 2013-12-21 ENCOUNTER — Ambulatory Visit (INDEPENDENT_AMBULATORY_CARE_PROVIDER_SITE_OTHER): Payer: Medicare Other | Admitting: Family Medicine

## 2013-12-21 ENCOUNTER — Encounter: Payer: Self-pay | Admitting: Family Medicine

## 2013-12-21 VITALS — BP 102/62 | HR 104 | Temp 98.4°F | Ht 63.0 in | Wt 136.5 lb

## 2013-12-21 DIAGNOSIS — J440 Chronic obstructive pulmonary disease with acute lower respiratory infection: Secondary | ICD-10-CM

## 2013-12-21 DIAGNOSIS — J441 Chronic obstructive pulmonary disease with (acute) exacerbation: Secondary | ICD-10-CM

## 2013-12-21 MED ORDER — PREDNISONE 20 MG PO TABS: ORAL_TABLET | ORAL | Status: DC

## 2013-12-21 MED ORDER — ALBUTEROL SULFATE HFA 108 (90 BASE) MCG/ACT IN AERS: 1.0000 | INHALATION_SPRAY | RESPIRATORY_TRACT | Status: DC | PRN

## 2013-12-21 MED ORDER — DOXYCYCLINE HYCLATE 100 MG PO TABS: 100.0000 mg | ORAL_TABLET | Freq: Two times a day (BID) | ORAL | Status: DC

## 2013-12-21 NOTE — Progress Notes (Signed)
Pre-visit discussion using our clinic review tool. No additional management support is needed unless otherwise documented below in the visit note.  

## 2013-12-21 NOTE — Progress Notes (Signed)
Date:  12/21/2013   Name:  Dominique Clark   DOB:  03/05/1941   MRN:  [id number] Gender: female Age: 72 y.o.  Primary Physician:  Crawford Givens, MD   Chief Complaint: Cough   Subjective:   History of Present Illness:  Dominique Clark is a 72 y.o. very pleasant female patient who presents with the following:  R ear hurts. Has some COPD, and she is a [place] bit short of breath and coughing up a lot of green nasty.   At baseline, the patient is and has some significant chronic obstructive pulmonary disease, and she uses Symbicort baseline. She actually had a chronic obstructive pulmonary disease exacerbation about a month ago and was given Zithromax as well as a low dose of some prednisone. She has had her symptoms returned with significant production of sputum, some shortness of breath, and the patient is actually hypoxic right now 88 percent oxygen on room air, but she is in no acute distress.  Past Medical History, Surgical History, Social History, Family History, Problem List, Medications, and Allergies have been reviewed and updated if relevant.  Review of Systems: ROS: GEN: Acute illness details above GI: Tolerating PO intake GU: maintaining adequate hydration and urination Pulm: No SOB Interactive and getting along well at home.  Otherwise, ROS is as per the HPI.   Objective:   Physical Examination: BP 102/62  Pulse 104  Temp(Src) 98.4 F (36.9 C) (Oral)  Ht 5\' 3"  (1.6 m)  Wt 136 lb 8 oz (61.916 kg)  BMI 24.19 kg/m2  SpO2 88%   GEN: A and O x 3. WDWN. NAD.    ENT: Nose clear, ext NML.  No LAD.  No JVD.  TM's clear. Oropharynx clear.  PULM: Normal WOB, no distress. Intermittent rhonchi and wheezing CV: RRR, no M/G/R, No rubs, No JVD.   EXT: warm and well-perfused, No c/c/e. PSYCH: Pleasant and conversant.    Laboratory and Imaging Data:  Assessment & Plan:    COPD exacerbation  Bronchitis, chronic obstructive w acute bronchitis  BRONCHITIS and copd  exac -Viral or baterial infections of the lung. Fever, cough, chest pain, shortness of breath, phlegm production, fatigue are symptoms.  Treatment: 1. Take all medicines 2. Antibiotics  3. Cough suppressants 4. Bronchodilators: an inhaler 5. Expectorant like Guaifenesin (Robitussin, Mucinex)  Fluids and Moisture help: drink lots of fluids Vaporizier or humidifier in room, shower steam --help loosen secretions and sooth breathing passages  Elevate head slightly when trying to sleep.   New medications, updates to list, dose adjustments: Meds ordered this encounter  Medications  . predniSONE (DELTASONE) 20 MG tablet    Sig: 2 tabs po for 4 days, then 1 tab po for 4 days    Dispense:  12 tablet    Refill:  0  . albuterol (PROVENTIL HFA;VENTOLIN HFA) 108 (90 BASE) MCG/ACT inhaler    Sig: Inhale 1-2 puffs into the lungs every 4 (four) hours as needed for wheezing.    Dispense:  1 Inhaler    Refill:  12  . doxycycline (VIBRA-TABS) 100 MG tablet    Sig: Take 1 tablet (100 mg total) by mouth 2 (two) times daily.    Dispense:  20 tablet    Refill:  0    Signed,  Shiron Whetsel T. Mylen Mangan, MD, CAQ Sports Medicine  [facility] at [facility] [address] [place] [address] Phone: [phone] Fax: [phone]  Updated Complete Medication List:   Medication List  This list is accurate as of: 12/21/13  2:44 PM.  Always use your most recent med list.               albuterol 108 (90 BASE) MCG/ACT inhaler  Commonly known as:  PROVENTIL HFA;VENTOLIN HFA  Inhale 1-2 puffs into the lungs every 4 (four) hours as needed for wheezing.     budesonide-formoterol 160-4.5 MCG/ACT inhaler  Commonly known as:  SYMBICORT  Inhale 2 puffs into the lungs 2 (two) times daily.     doxycycline 100 MG tablet  Commonly known as:  VIBRA-TABS  Take 1 tablet (100 mg total) by mouth 2 (two) times daily.     fluticasone 50 MCG/ACT nasal spray  Commonly known as:  FLONASE  Place 2  sprays into the nose daily.     guaiFENesin 600 MG 12 hr tablet  Commonly known as:  MUCINEX  Take 1,200 mg by mouth 2 (two) times daily as needed.     meloxicam 15 MG tablet  Commonly known as:  MOBIC  Take 1 tablet (15 mg total) by mouth daily.     metFORMIN 500 MG tablet  Commonly known as:  GLUCOPHAGE  TAKE ONE (1) TABLET BY MOUTH TWO (2) TIMES DAILY WITH A MEAL     methocarbamol 500 MG tablet  Commonly known as:  ROBAXIN  Take 1,000 mg by mouth at bedtime.     metoprolol succinate 25 MG 24 hr tablet  Commonly known as:  TOPROL-XL  TAKE 1 TABLET BY MOUTH DAILY     omeprazole 40 MG capsule  Commonly known as:  PRILOSEC  TAKE ONE (1) CAPSULE BY MOUTH 2 TIMES DAILY     predniSONE 20 MG tablet  Commonly known as:  DELTASONE  2 tabs po for 4 days, then 1 tab po for 4 days     traMADol 50 MG tablet  Commonly known as:  ULTRAM  TAKE 1-2 TABLETS BY MOUTH EVERY 8 HOURS AS NEEDED FOR PAIN     triamcinolone cream 0.5 %  Commonly known as:  KENALOG  Apply topically 3 (three) times daily as needed. For itching

## 2013-12-26 MED ORDER — ALBUTEROL SULFATE HFA 108 (90 BASE) MCG/ACT IN AERS: 1.0000 | INHALATION_SPRAY | RESPIRATORY_TRACT | Status: DC | PRN

## 2013-12-26 NOTE — Addendum Note (Signed)
Addended by: Damita Lack on: 12/26/2013 09:57 AM   Modules accepted: Orders

## 2014-01-19 ENCOUNTER — Telehealth: Payer: Self-pay | Admitting: Family Medicine

## 2014-01-19 NOTE — Telephone Encounter (Signed)
Relevant patient education mailed to patient.  

## 2014-01-25 ENCOUNTER — Ambulatory Visit (INDEPENDENT_AMBULATORY_CARE_PROVIDER_SITE_OTHER): Payer: Medicare HMO | Admitting: Podiatry

## 2014-01-25 ENCOUNTER — Encounter: Payer: Self-pay | Admitting: Podiatry

## 2014-01-25 VITALS — BP 156/84 | HR 94 | Resp 16 | Ht 66.0 in | Wt 136.0 lb

## 2014-01-25 DIAGNOSIS — L03039 Cellulitis of unspecified toe: Secondary | ICD-10-CM

## 2014-01-25 MED ORDER — CEPHALEXIN 500 MG PO CAPS: 500.0000 mg | ORAL_CAPSULE | Freq: Four times a day (QID) | ORAL | Status: DC

## 2014-01-25 NOTE — Progress Notes (Signed)
   Subjective:    Patient ID: Dominique Clark, female    DOB: 03/01/1941, 73 y.o.   MRN: [id number]  HPI Comments: "My two big toenails hurt me"  Patient states that her 1st toes bilateral are very tender around the toenail. Its been like this for few years off and on. She is unable to trim the nails anymore. The nails are thick and discolored. She hasn't treated them with home.      Review of Systems  Respiratory: Positive for cough, chest tightness, shortness of breath and wheezing.   Gastrointestinal: Positive for abdominal pain.  All other systems reviewed and are negative.       Objective:   Physical Exam Orientated [phone] 73 year old white female  Vascular: DP are 0/4 bilaterally. PTs are 2/4 bilaterally  Neurological: Knee and ankle flex equal reactive bilaterally  Dermatological: All toenails are hypertrophic brittle, discolored. The right hallux toenail is very tender to pressure  Musculoskeletal, no deformities noted       Assessment & Plan:   Assessment: Onychomycoses right hallux nail with underlying paronychia  Plan: The right hallux was debrided an approximately the distal one half lift off the nail bed. The underlying nailbed demonstrates a red granular base with serous drainage. No erythema, edema or malodor noted  Cephalexin 500 mg #28 by mouth 4 times a day x7 days. Antibacterial soft soaks with topical antibiotic ointment applied the right hallux nail until healed.  Reappoint at three-month intervals for debridement of mycotic toenails or sooner if patient has concern.

## 2014-01-25 NOTE — Patient Instructions (Addendum)
Diabetes and Foot Care Diabetes may cause you to have problems because of poor blood supply (circulation) to your feet and legs. This may cause the skin on your feet to become thinner, break easier, and heal more slowly. Your skin may become dry, and the skin may peel and crack. You may also have nerve damage in your legs and feet causing decreased feeling in them. You may not notice minor injuries to your feet that could lead to infections or more serious problems. Taking care of your feet is one of the most important things you can do for yourself.  HOME CARE INSTRUCTIONS  Wear shoes at all times, even in the house. Do not go barefoot. Bare feet are easily injured.  Check your feet daily for blisters, cuts, and redness. If you cannot see the bottom of your feet, use a mirror or ask someone for help.  Wash your feet with warm water (do not use hot water) and mild soap. Then pat your feet and the areas between your toes until they are completely dry. Do not soak your feet as this can dry your skin.  Apply a moisturizing lotion or petroleum jelly (that does not contain alcohol and is unscented) to the skin on your feet and to dry, brittle toenails. Do not apply lotion between your toes.  Trim your toenails straight across. Do not dig under them or around the cuticle. File the edges of your nails with an emery board or nail file.  Do not cut corns or calluses or try to remove them with medicine.  Wear clean socks or stockings every day. Make sure they are not too tight. Do not wear knee-high stockings since they may decrease blood flow to your legs.  Wear shoes that fit properly and have enough cushioning. To break in new shoes, wear them for just a few hours a day. This prevents you from injuring your feet. Always look in your shoes before you put them on to be sure there are no objects inside.  Do not cross your legs. This may decrease the blood flow to your feet.  If you find a minor scrape,  cut, or break in the skin on your feet, keep it and the skin around it clean and dry. These areas may be cleansed with mild soap and water. Do not cleanse the area with peroxide, alcohol, or iodine.  When you remove an adhesive bandage, be sure not to damage the skin around it.  If you have a wound, look at it several times a day to make sure it is healing.  Do not use heating pads or hot water bottles. They may burn your skin. If you have lost feeling in your feet or legs, you may not know it is happening until it is too late.  Make sure your health care provider performs a complete foot exam at least annually or more often if you have foot problems. Report any cuts, sores, or bruises to your health care provider immediately. SEEK MEDICAL CARE IF:   You have an injury that is not healing.  You have cuts or breaks in the skin.  You have an ingrown nail.  You notice redness on your legs or feet.  You feel burning or tingling in your legs or feet.  You have pain or cramps in your legs and feet.  Your legs or feet are numb.  Your feet always feel cold. SEEK IMMEDIATE MEDICAL CARE IF:   There is increasing redness,   swelling, or pain in or around a wound.  There is a red line that goes up your leg.  Pus is coming from a wound.  You develop a fever or as directed by your health care provider.  You notice a bad smell coming from an ulcer or wound. Document Released: 12/12/2000 Document Revised: 08/17/2013 Document Reviewed: 05/24/2013 [facility] Patient Information 2014 [place] [place]. ANTIBACTERIAL SOAP INSTRUCTIONS  THE DAY AFTER PROCEDURE  Please follow the instructions your doctor has marked.   Shower as usual. Before getting out, place a drop of antibacterial liquid soap (Dial) on a wet, clean washcloth.  Gently wipe washcloth over affected area.  Afterward, rinse the area with warm water.  Blot the area dry with a soft cloth and cover with antibiotic ointment (neosporin,  polysporin, bacitracin) and band aid or gauze and tape  Place 3-4 drops of antibacterial liquid soap in a quart of warm tap water.  Submerge foot into water for 20 minutes.  If bandage was applied after your procedure, leave on to allow for easy lift off, then remove and continue with soak for the remaining time.  Next, blot area dry with a soft cloth and cover with a bandage.  Apply other medications as directed by your doctor, such as cortisporin otic solution (eardrops) or neosporin antibiotic ointment

## 2014-01-30 ENCOUNTER — Encounter: Payer: Self-pay | Admitting: Gastroenterology

## 2014-01-30 ENCOUNTER — Ambulatory Visit (INDEPENDENT_AMBULATORY_CARE_PROVIDER_SITE_OTHER): Payer: Medicare HMO | Admitting: Gastroenterology

## 2014-01-30 VITALS — BP 130/70 | HR 84 | Ht 63.0 in | Wt 136.1 lb

## 2014-01-30 DIAGNOSIS — K219 Gastro-esophageal reflux disease without esophagitis: Secondary | ICD-10-CM

## 2014-01-30 DIAGNOSIS — Z1211 Encounter for screening for malignant neoplasm of colon: Secondary | ICD-10-CM

## 2014-01-30 DIAGNOSIS — J449 Chronic obstructive pulmonary disease, unspecified: Secondary | ICD-10-CM

## 2014-01-30 NOTE — Patient Instructions (Signed)
You have been scheduled for an endoscopy with propofol. Please follow written instructions given to you at your visit today. If you use inhalers (even only as needed), please bring them with you on the day of your procedure. Your physician has requested that you go to www.startemmi.com and enter the access code given to you at your visit today. This web site gives a general overview about your procedure. However, you should still follow specific instructions given to you by our office regarding your preparation for the procedure.  Zegerid samples given today It has been recommended to you by your physician that you have a(n)Colonoscopy completed. Per your request, we did not schedule the procedure(s) today. Please contact our office at [phone] should you decide to have the procedure completed.

## 2014-01-30 NOTE — Assessment & Plan Note (Signed)
Patient remains symptomatic despite over-the-counter Prilosec.    Recommendations #1 trial of Zegerid 40 mg daily

## 2014-01-30 NOTE — Assessment & Plan Note (Signed)
Suspect recurrent peptic esophageal stricture  Recommendations #1 upper endoscopy with dilatation as indicated

## 2014-01-30 NOTE — Assessment & Plan Note (Signed)
Last colonoscopy 2004.  Plan followup exam this year

## 2014-01-30 NOTE — Progress Notes (Signed)
_                                                                                                                History of Present Illness: Pleasant 73 year old white female with history of COPD, esophageal stricture, diabetes referred for evaluation of dysphagia.  She's complaining of dysphagia to solids.  At times she gets choked and has to cough up food.  She's also having pyrosis despite taking over-the-counter Prilosec.  Her last esophageal dilatation was in 2004.  Colonoscopy in 2004 was unremarkable.    Past Medical History  Diagnosis Date  . Diabetes mellitus     type 2  . Gastritis     via EGD  . Hiatal hernia     s/p dilation 2004  . Diverticulosis     s/p colonoscopy  . COPD (chronic obstructive pulmonary disease)   . Stress incontinence, female   . Osteopenia    Past Surgical History  Procedure Laterality Date  . Bladder repair  9/04    bladder tack   . Breast cyst excision Right 1972  . Tonsillectomy      age 73  . Total abdominal hysterectomy     family history includes Stroke in her mother. Current Outpatient Prescriptions  Medication Sig Dispense Refill  . albuterol (PROVENTIL HFA;VENTOLIN HFA) 108 (90 BASE) MCG/ACT inhaler Inhale 1-2 puffs into the lungs every 4 (four) hours as needed for wheezing.  1 Inhaler  12  . budesonide-formoterol (SYMBICORT) 160-4.5 MCG/ACT inhaler Inhale 2 puffs into the lungs 2 (two) times daily.  1 Inhaler  12  . cephALEXin (KEFLEX) 500 MG capsule Take 1 capsule (500 mg total) by mouth 4 (four) times daily.  28 capsule  1  . fluticasone (FLONASE) 50 MCG/ACT nasal spray Place 2 sprays into the nose daily.  16 g  6  . guaiFENesin (MUCINEX) 600 MG 12 hr tablet Take 1,200 mg by mouth 2 (two) times daily as needed.      . meloxicam (MOBIC) 15 MG tablet Take 1 tablet (15 mg total) by mouth daily.  30 tablet  5  . metFORMIN (GLUCOPHAGE) 500 MG tablet TAKE ONE (1) TABLET BY MOUTH TWO (2) TIMES DAILY WITH A MEAL  60 tablet   3  . metoprolol succinate (TOPROL-XL) 25 MG 24 hr tablet TAKE 1 TABLET BY MOUTH DAILY  30 tablet  6  . omeprazole (PRILOSEC) 40 MG capsule TAKE ONE (1) CAPSULE BY MOUTH 2 TIMES DAILY  60 capsule  6  . traMADol (ULTRAM) 50 MG tablet TAKE 1-2 TABLETS BY MOUTH EVERY 8 HOURS AS NEEDED FOR PAIN  180 tablet  2  . triamcinolone cream (KENALOG) 0.5 % Apply topically 3 (three) times daily as needed. For itching  30 g  0   No current facility-administered medications for this visit.   Allergies as of 01/30/2014  . (No Known Allergies)    reports that she quit smoking about 25 years ago. Her smoking use included Cigarettes. She smoked 0.00 packs  per day. She has never used smokeless tobacco. She reports that she does not drink alcohol or use illicit drugs.     Review of Systems: She has dyspnea on exertion and occasional shortness of breath Pertinent positive and negative review of systems were noted in the above HPI section. All other review of systems were otherwise negative.  Vital signs were reviewed in today's medical record Physical Exam: General: Well developed , well nourished, no acute distress Skin: anicteric Head: Normocephalic and atraumatic Eyes:  sclerae anicteric, EOMI Ears: Normal auditory acuity Mouth: No deformity or lesions Neck: Supple, no masses or thyromegaly Lungs: Clear throughout to auscultation Heart: Regular rate and rhythm; no murmurs, rubs or bruits Abdomen: Soft, non tender and non distended. No masses, hepatosplenomegaly or hernias noted. Normal Bowel sounds Rectal:deferred Musculoskeletal: Symmetrical with no gross deformities  Skin: No lesions on visible extremities Pulses:  Normal pulses noted Extremities: No clubbing, cyanosis, edema or deformities noted Neurological: Alert oriented x 4, grossly nonfocal Cervical Nodes:  No significant cervical adenopathy Inguinal Nodes: No significant inguinal adenopathy Psychological:  Alert and cooperative. Normal  mood and affect  See Assessment and Plan under Problem List

## 2014-01-30 NOTE — Assessment & Plan Note (Signed)
Currently stable respiratory function

## 2014-01-31 ENCOUNTER — Encounter: Payer: Self-pay | Admitting: Gastroenterology

## 2014-01-31 ENCOUNTER — Other Ambulatory Visit: Payer: Self-pay

## 2014-01-31 ENCOUNTER — Ambulatory Visit (HOSPITAL_COMMUNITY)
Admission: RE | Admit: 2014-01-31 | Discharge: 2014-01-31 | Disposition: A | Discharge status: Home / Self Care | Payer: Medicare HMO | Source: Ambulatory Visit | Attending: Gastroenterology | Admitting: Gastroenterology

## 2014-01-31 ENCOUNTER — Ambulatory Visit (AMBULATORY_SURGERY_CENTER): Payer: Medicare HMO | Admitting: Gastroenterology

## 2014-01-31 VITALS — BP 169/94 | HR 91 | Temp 97.6°F | Resp 19 | Ht 63.0 in | Wt 136.0 lb

## 2014-01-31 DIAGNOSIS — IMO0002 Reserved for concepts with insufficient information to code with codable children: Secondary | ICD-10-CM | POA: Insufficient documentation

## 2014-01-31 DIAGNOSIS — S1121XA Laceration without foreign body of pharynx and cervical esophagus, initial encounter: Secondary | ICD-10-CM

## 2014-01-31 DIAGNOSIS — R1310 Dysphagia, unspecified: Secondary | ICD-10-CM

## 2014-01-31 DIAGNOSIS — K449 Diaphragmatic hernia without obstruction or gangrene: Secondary | ICD-10-CM | POA: Insufficient documentation

## 2014-01-31 DIAGNOSIS — K222 Esophageal obstruction: Secondary | ICD-10-CM

## 2014-01-31 LAB — GLUCOSE, CAPILLARY
GLUCOSE-CAPILLARY: 105 mg/dL — AB (ref 70–99)
GLUCOSE-CAPILLARY: 86 mg/dL (ref 70–99)
Glucose-Capillary: 92 mg/dL (ref 70–99)

## 2014-01-31 MED ORDER — IOHEXOL 300 MG/ML  SOLN
150.0000 mL | Freq: Once | INTRAMUSCULAR | Status: AC | PRN
  Administered 2014-01-31: 150 mL via ORAL

## 2014-01-31 MED ORDER — SODIUM CHLORIDE 0.9 % IV SOLN: 500.0000 mL | INTRAVENOUS | Status: DC

## 2014-01-31 NOTE — Progress Notes (Signed)
Lidocaine-40mg IV prior to Propofol InductionPropofol given over incremental dosages 

## 2014-01-31 NOTE — Progress Notes (Signed)
[facility] for stat Gastrografin swallow study. Advised Linda,per Dr. Arlyce DiceKaplan, to call cell phone immediately when results available.

## 2014-01-31 NOTE — Progress Notes (Signed)
Pt says she told them(3rd floor gi office) yesterday that she has an infected ingrown toenail on rt great toe ,has been on antibiotic for 1 week for this.

## 2014-01-31 NOTE — Op Note (Signed)
[facility] [address] [place], [id number]   ENDOSCOPY PROCEDURE REPORT  PATIENT: [name], Dominique I.  MR#: [id number] BIRTHDATE: 1941-01-15 , 72  yrs. old GENDER: Female ENDOSCOPIST: Louis Meckelobert D Ovie Eastep, MD REFERRED BY:  Crawford GivensGraham Duncan, M.D. PROCEDURE DATE:  01/31/2014 PROCEDURE:  EGD, diagnostic and Maloney dilation of esophagus ASA CLASS:     Class II INDICATIONS:  Dysphagia. MEDICATIONS: MAC sedation, administered by CRNA, propofol (Diprivan) 100mg  IV, and Simethicone 0.6cc PO TOPICAL ANESTHETIC:  DESCRIPTION OF PROCEDURE: After the risks benefits and alternatives of the procedure were thoroughly explained, informed consent was obtained.  The LB [id number] endoscope was introduced through the mouth and advanced to the third portion of the duodenum. Without limitations.  The instrument was slowly withdrawn as the mucosa was fully examined.      There was a moderate stricture at the GE junction.  The 9 mm gastroscope easily traversed the stricture.  There was a 9 cm sliding hiatal hernia.   The remainder of the upper endoscopy exam was otherwise normal.         The scope was then withdrawn from the patient.  a #52 [place] Maloney dilator was passed with only minor resistance.  There was no blood at the dilator heme.  The endoscope was reinserted into the esophagus where there was a moderate amount of blood in the distal esophagus.  A superficial mucosal tear was noted.  Scope was then withdrawn. COMPLICATIONS: There were no complications.  ENDOSCOPIC IMPRESSION: Esophageal stricture-status post Maloney dilation Esophageal superficial mucosal tear  RECOMMENDATIONS: Gastrograffin swallow Office visit 2-3 weeks.  If dysphagia persists will repeat dilatation utilizing balloon dilators Soft diet for 2-3 days REPEAT EXAM:  eSigned:  Louis Meckelobert D Ranada Vigorito, MD 01/31/2014 1:53 PM   CC:

## 2014-01-31 NOTE — Progress Notes (Signed)
No heme on dilator,but heme noted when scope re enterend.

## 2014-01-31 NOTE — Patient Instructions (Signed)

## 2014-01-31 NOTE — Progress Notes (Signed)
Post dilitation endoscopy revealed a superficial esophageal mucosal tear. Pt reports no chest or abdominal pain.  Plan gastrograffin swallow to r/o perforation.

## 2014-01-31 NOTE — Progress Notes (Signed)
Called to room to assist during endoscopic procedure.  Patient ID and intended procedure confirmed with present staff. Received instructions for my participation in the procedure from the performing physician.  

## 2014-02-01 ENCOUNTER — Telehealth: Payer: Self-pay | Admitting: *Deleted

## 2014-02-01 NOTE — Telephone Encounter (Signed)
Form signed, thanks.

## 2014-02-01 NOTE — Telephone Encounter (Signed)
  Follow up Call-  Call back number 01/31/2014  Post procedure Call Back phone  # [phone]  Permission to leave phone message Yes     No answer,left message.

## 2014-02-01 NOTE — Telephone Encounter (Signed)
error 

## 2014-02-01 NOTE — Telephone Encounter (Signed)
Form for diabetic testing supplies in your IN box for completion. 

## 2014-02-02 NOTE — Telephone Encounter (Signed)
Faxed

## 2014-02-03 ENCOUNTER — Other Ambulatory Visit: Payer: Self-pay | Admitting: Family Medicine

## 2014-02-13 ENCOUNTER — Encounter: Payer: Self-pay | Admitting: Family Medicine

## 2014-02-13 ENCOUNTER — Ambulatory Visit (INDEPENDENT_AMBULATORY_CARE_PROVIDER_SITE_OTHER): Payer: Medicare HMO | Admitting: Family Medicine

## 2014-02-13 VITALS — BP 124/70 | HR 83 | Temp 97.4°F | Wt 136.5 lb

## 2014-02-13 DIAGNOSIS — J441 Chronic obstructive pulmonary disease with (acute) exacerbation: Secondary | ICD-10-CM

## 2014-02-13 MED ORDER — DOXYCYCLINE HYCLATE 100 MG PO TABS: 100.0000 mg | ORAL_TABLET | Freq: Two times a day (BID) | ORAL | Status: DC

## 2014-02-13 MED ORDER — PREDNISONE 20 MG PO TABS: ORAL_TABLET | ORAL | Status: DC

## 2014-02-13 NOTE — Patient Instructions (Signed)
Start the doxycycline today and take the prednisone with food.  Take care.

## 2014-02-13 NOTE — Progress Notes (Signed)
Pre-visit discussion using our clinic review tool. No additional management support is needed unless otherwise documented below in the visit note.  Sx started about 3-4 days ago.  Coughing up green phlegm.  ST.  Chest sore from coughing.  Facial pain, maxillary and frontal.  No ear pain.  Some wheeze.    Meds, vitals, and allergies reviewed.   ROS: See HPI.  Otherwise, noncontributory.  GEN: nad, alert and oriented HEENT: mucous membranes moist, tm w/o erythema, nasal exam w/o erythema, clear discharge noted,  OP with cobblestoning, max > frontal sinus tenderness NECK: supple w/o LA CV: rrr.   PULM: exp wheeze and rhonchi noted but no inc wob EXT: no edema SKIN: no acute rash

## 2014-02-14 NOTE — Assessment & Plan Note (Signed)
Presumed, given her sx.  More wheeze and thick green sputum.  Start doxy, continue baseline meds, add on pred with steroid caution.  She agrees.  Nontoxic, okay for outpatient f/u.  Call back prn.

## 2014-02-20 ENCOUNTER — Ambulatory Visit (INDEPENDENT_AMBULATORY_CARE_PROVIDER_SITE_OTHER): Payer: Medicare HMO | Admitting: Gastroenterology

## 2014-02-20 ENCOUNTER — Encounter: Payer: Self-pay | Admitting: Gastroenterology

## 2014-02-20 VITALS — BP 130/82 | HR 70 | Ht 63.5 in | Wt 137.0 lb

## 2014-02-20 DIAGNOSIS — K222 Esophageal obstruction: Secondary | ICD-10-CM | POA: Insufficient documentation

## 2014-02-20 DIAGNOSIS — K219 Gastro-esophageal reflux disease without esophagitis: Secondary | ICD-10-CM

## 2014-02-20 DIAGNOSIS — Z1211 Encounter for screening for malignant neoplasm of colon: Secondary | ICD-10-CM

## 2014-02-20 NOTE — Progress Notes (Signed)
          History of Present Illness:  The patient has returned following Maloney dilation.  She reports improvement in dysphagia although it remains intermittent depending upon what she eats.  Pyrosis is improved.  A superficial mucosal tear was noted following dilation.  Followup Gastrografin study was negative.  She is going to postpone any further procedures until she works out insurance issues.    Review of Systems: Pertinent positive and negative review of systems were noted in the above HPI section. All other review of systems were otherwise negative.    Current Medications, Allergies, Past Medical History, Past Surgical History, Family History and Social History were reviewed in [facility] electronic medical record  Vital signs were reviewed in today's medical record. Physical Exam: General: Well developed , well nourished, no acute distress   See Assessment and Plan under Problem List

## 2014-02-20 NOTE — Assessment & Plan Note (Signed)
Plan elective colonoscopy as soon as insurance issues are resolved.

## 2014-02-20 NOTE — Assessment & Plan Note (Signed)
The patient has a peptic stricture.  She is mildly symptomatic.  I carefully instructed her to contact me if symptoms worsen at which point I would do a balloon dilation.

## 2014-02-20 NOTE — Patient Instructions (Signed)
It has been recommended to you by your physician that you have a(n) colonoscopy completed. Per your request, we did not schedule the procedure(s) today. Please contact our office at [phone] should you decide to have the procedure completed. 

## 2014-02-20 NOTE — Assessment & Plan Note (Signed)
Plan to continue PPI therapy.

## 2014-03-03 ENCOUNTER — Other Ambulatory Visit: Payer: Self-pay | Admitting: Family Medicine

## 2014-03-03 NOTE — Telephone Encounter (Signed)
Electronic refill request.  Please advise. 

## 2014-03-04 NOTE — Telephone Encounter (Signed)
meloxicam sent, please call in the tramadol. Thanks.

## 2014-03-06 NOTE — Telephone Encounter (Signed)
Medication phoned to pharmacy.  

## 2014-04-04 ENCOUNTER — Ambulatory Visit: Payer: Medicare HMO | Admitting: Family Medicine

## 2014-04-05 ENCOUNTER — Encounter: Payer: Self-pay | Admitting: Family Medicine

## 2014-04-05 ENCOUNTER — Ambulatory Visit (INDEPENDENT_AMBULATORY_CARE_PROVIDER_SITE_OTHER): Payer: Medicare HMO | Admitting: Family Medicine

## 2014-04-05 VITALS — BP 138/78 | HR 90 | Temp 98.1°F | Wt 135.8 lb

## 2014-04-05 DIAGNOSIS — J069 Acute upper respiratory infection, unspecified: Secondary | ICD-10-CM

## 2014-04-05 MED ORDER — CEPHALEXIN 500 MG PO CAPS: 500.0000 mg | ORAL_CAPSULE | Freq: Three times a day (TID) | ORAL | Status: DC

## 2014-04-05 NOTE — Patient Instructions (Signed)
Drink plenty of fluids, take tylenol as needed, and gargle with warm salt water for your throat.  Start the antibiotics today.  This should gradually improve.  Take care.  Let us know if you have other concerns.

## 2014-04-05 NOTE — Progress Notes (Signed)
Pre visit review using our clinic review tool, if applicable. No additional management support is needed unless otherwise documented below in the visit note.  She hasn't run out of inhalers yet.  Recently with URI sx.  Sx started about 1 week ago.  She started keflex about 2 days ago.  She is some better in the meantime.  No fevers.  Some cough, mainly post nasal gtt and improve in the meantime.  No ear pain.  Some sinus pain, better recently.  Sputum is clearing up now.  No ST now, prev was sore.  Chest is sore from coughing.    Meds, vitals, and allergies reviewed.   ROS: See HPI.  Otherwise, noncontributory.  GEN: nad, alert and oriented HEENT: mucous membranes moist, tm w/o erythema, nasal exam w/o erythema, clear discharge noted,  OP with cobblestoning, Sinuses not ttp now. NECK: supple w/o LA CV: rrr.   PULM: ctab except for scant B faint wheeze, no inc wob EXT: no edema SKIN: no acute rash

## 2014-04-06 DIAGNOSIS — J069 Acute upper respiratory infection, unspecified: Secondary | ICD-10-CM | POA: Insufficient documentation

## 2014-04-06 NOTE — Assessment & Plan Note (Signed)
Presumed resolving URI/sinusitis, continue keflex and f/u prn.  She agrees.  Nontoxic.

## 2014-04-17 ENCOUNTER — Ambulatory Visit: Payer: Medicare HMO | Admitting: Podiatry

## 2014-06-01 ENCOUNTER — Other Ambulatory Visit: Payer: Self-pay | Admitting: Family Medicine

## 2014-06-01 NOTE — Telephone Encounter (Signed)
Please call in.  Thanks.   

## 2014-06-01 NOTE — Telephone Encounter (Signed)
Electronic refill request. Last Filled:   180 tabs 0 RF on 03/04/14.  Please advise.

## 2014-06-02 NOTE — Telephone Encounter (Signed)
Medication phoned to pharmacy.  

## 2014-06-05 ENCOUNTER — Ambulatory Visit (INDEPENDENT_AMBULATORY_CARE_PROVIDER_SITE_OTHER): Payer: Medicare HMO | Admitting: Podiatry

## 2014-06-05 ENCOUNTER — Encounter: Payer: Self-pay | Admitting: Podiatry

## 2014-06-05 VITALS — BP 134/70 | HR 86 | Resp 18

## 2014-06-05 DIAGNOSIS — B351 Tinea unguium: Secondary | ICD-10-CM

## 2014-06-05 DIAGNOSIS — M79609 Pain in unspecified limb: Secondary | ICD-10-CM

## 2014-06-05 NOTE — Progress Notes (Signed)
Patient ID: Dominique Clark, female   DOB: 1941-07-27, 73 y.o.   MRN: [id number] Subjective: Orientated x3 white female  Objective: Elongated, hypertrophic, incurvated, discolored toenails x10. The lateral margins the third and fourth right toenails are especially tender.  Assessment: Symptomatic onychomycoses x10  Plan: Debrided toenails x10 without a bleeding  Reappoint in 3 months

## 2014-07-05 ENCOUNTER — Other Ambulatory Visit: Payer: Self-pay | Admitting: Family Medicine

## 2014-07-18 ENCOUNTER — Other Ambulatory Visit: Payer: Self-pay | Admitting: Family Medicine

## 2014-07-18 DIAGNOSIS — E119 Type 2 diabetes mellitus without complications: Secondary | ICD-10-CM

## 2014-07-24 ENCOUNTER — Other Ambulatory Visit (INDEPENDENT_AMBULATORY_CARE_PROVIDER_SITE_OTHER): Payer: Medicare HMO

## 2014-07-24 ENCOUNTER — Encounter: Payer: Self-pay | Admitting: *Deleted

## 2014-07-24 DIAGNOSIS — E119 Type 2 diabetes mellitus without complications: Secondary | ICD-10-CM

## 2014-07-24 LAB — HEMOGLOBIN A1C: HEMOGLOBIN A1C: 6.2 % (ref 4.6–6.5)

## 2014-07-24 LAB — LDL CHOLESTEROL, DIRECT: Direct LDL: 87.9 mg/dL

## 2014-07-29 ENCOUNTER — Other Ambulatory Visit: Payer: Self-pay | Admitting: Family Medicine

## 2014-07-31 NOTE — Telephone Encounter (Signed)
Received refill request electronically. Last refill 06/01/14 #180/1, last office visit 04/05/14/acute visit. Spoke to pharmacist and was advised that she got her last refill on 07/05/14. Is it okay to refill medication?

## 2014-08-01 NOTE — Telephone Encounter (Signed)
Medication phoned to pharmacy.  

## 2014-08-01 NOTE — Telephone Encounter (Signed)
Please call in.  Thanks.   

## 2014-08-28 ENCOUNTER — Other Ambulatory Visit: Payer: Self-pay | Admitting: Family Medicine

## 2014-08-29 ENCOUNTER — Other Ambulatory Visit: Payer: Self-pay | Admitting: Family Medicine

## 2014-09-18 ENCOUNTER — Encounter: Payer: Self-pay | Admitting: Podiatry

## 2014-09-18 ENCOUNTER — Ambulatory Visit (INDEPENDENT_AMBULATORY_CARE_PROVIDER_SITE_OTHER): Payer: Medicare HMO | Admitting: Podiatry

## 2014-09-18 VITALS — BP 128/58 | HR 86 | Resp 18

## 2014-09-18 DIAGNOSIS — M79609 Pain in unspecified limb: Secondary | ICD-10-CM

## 2014-09-18 DIAGNOSIS — B351 Tinea unguium: Secondary | ICD-10-CM

## 2014-09-18 DIAGNOSIS — M79676 Pain in unspecified toe(s): Secondary | ICD-10-CM

## 2014-09-18 NOTE — Patient Instructions (Signed)
Apply antibiotic ointment daily to the skin tear in the fourth right toe, cover with a Band-Aid until healed

## 2014-09-19 NOTE — Progress Notes (Signed)
Patient ID: Dominique Clark, female   DOB: [birth year], 73 y.o.   MRN: [id number]  Subjective: This patient presents today complaining of painful toenails her request debridement. She admits to pulling the skin on the fourth right toe the last several days.  Objective: Fourth right toe distally has a skin tear without any erythema, edema or active drainage The toenails are hypertrophic, elongated, discolored 6-10  Assessment: Skin tear fourth right toe without a clinical sign of infection Symptomatic onychomycoses 6-10  Plan: Patient advised to apply topical antibiotic ointment fourth toe right foot daily until healed Toenails x10 are debrided without a bleeding  Reappoint x3 months

## 2014-09-29 ENCOUNTER — Other Ambulatory Visit: Payer: Self-pay | Admitting: Family Medicine

## 2014-09-29 MED ORDER — TRAMADOL HCL 50 MG PO TABS: ORAL_TABLET | ORAL | Status: DC

## 2014-09-29 NOTE — Telephone Encounter (Signed)
I called pharmacy.  She needs a refill.  This isn't early.  Okay to fill.  D/w pt pharmacy.  I dropped the hard copy off at the pharmacy since I was going to pass by.

## 2014-09-29 NOTE — Telephone Encounter (Signed)
[facility] showing that refill for Tramadol was denied by prescriber, showing that it was too soon. [facility] this was last filled on 08/28/14 for a 30 day supply. Please advise.

## 2014-09-29 NOTE — Telephone Encounter (Signed)
Pharmacy to notify pt re: rx.

## 2014-09-29 NOTE — Addendum Note (Signed)
Addended by: Joaquim NamUNCAN, Audelia Knape S on: 09/29/2014 05:22 PM   Modules accepted: Orders

## 2014-10-30 ENCOUNTER — Other Ambulatory Visit: Payer: Self-pay | Admitting: Family Medicine

## 2014-10-30 NOTE — Telephone Encounter (Signed)
Sent. Schedule a CPE.  Thanks.  

## 2014-10-30 NOTE — Telephone Encounter (Signed)
Received refill request electronically from pharmacy. Last refill for Mobic was 08/28/14 #30/1, last office visit 04/05/14. Is it okay to refill medication?

## 2014-10-31 NOTE — Telephone Encounter (Signed)
Patient notified by telephone that she needs to schedule a physical. Patient stated that she will have to check and see when she can come and will call back within the next week and get this scheduled.

## 2014-11-14 ENCOUNTER — Encounter: Payer: Self-pay | Admitting: Family Medicine

## 2014-11-14 ENCOUNTER — Ambulatory Visit (INDEPENDENT_AMBULATORY_CARE_PROVIDER_SITE_OTHER): Payer: Medicare HMO | Admitting: Family Medicine

## 2014-11-14 VITALS — BP 118/60 | HR 64 | Temp 98.2°F | Wt 124.5 lb

## 2014-11-14 DIAGNOSIS — R300 Dysuria: Secondary | ICD-10-CM

## 2014-11-14 DIAGNOSIS — N3090 Cystitis, unspecified without hematuria: Secondary | ICD-10-CM

## 2014-11-14 LAB — POCT URINALYSIS DIPSTICK
Bilirubin, UA: NEGATIVE
GLUCOSE UA: NEGATIVE
Nitrite, UA: NEGATIVE
PH UA: 6
Spec Grav, UA: >=1.03
Urobilinogen, UA: 0.2

## 2014-11-14 MED ORDER — SULFAMETHOXAZOLE-TRIMETHOPRIM 400-80 MG PO TABS: 1.0000 | ORAL_TABLET | Freq: Two times a day (BID) | ORAL | Status: DC

## 2014-11-14 MED ORDER — METOPROLOL SUCCINATE ER 25 MG PO TB24: 25.0000 mg | ORAL_TABLET | Freq: Every day | ORAL | Status: DC

## 2014-11-14 MED ORDER — METFORMIN HCL 500 MG PO TABS: 500.0000 mg | ORAL_TABLET | Freq: Two times a day (BID) | ORAL | Status: DC

## 2014-11-14 MED ORDER — MELOXICAM 15 MG PO TABS: 15.0000 mg | ORAL_TABLET | Freq: Every day | ORAL | Status: DC

## 2014-11-14 NOTE — Progress Notes (Signed)
Pre visit review using our clinic review tool, if applicable. No additional management support is needed unless otherwise documented below in the visit note.  She has been doing well overall, rarely using SABA and compliant with symbicort.   Dysuria: yes duration of symptoms: a few days abdominal pain: no fevers:no back pain:no vomiting:no  Meds, vitals, and allergies reviewed.   ROS: See HPI.  Otherwise negative.    GEN: nad, alert and oriented HEENT: mucous membranes moist NECK: supple CV: rrr.  PULM: ctab, no inc wob ABD: soft, +bs, suprapubic area not tender EXT: no edema SKIN: no acute rash BACK: no CVA pain

## 2014-11-14 NOTE — Patient Instructions (Addendum)
Please schedule a physical for the spring of 2016.   Drink plenty of water and start the antibiotics today.  We'll contact you with your lab report.  Take care.

## 2014-11-15 ENCOUNTER — Other Ambulatory Visit: Payer: Self-pay | Admitting: Family Medicine

## 2014-11-15 DIAGNOSIS — N3090 Cystitis, unspecified without hematuria: Secondary | ICD-10-CM | POA: Insufficient documentation

## 2014-11-15 NOTE — Telephone Encounter (Signed)
Patient notified that script has been called, but she will not be able to get it until it is due.

## 2014-11-15 NOTE — Telephone Encounter (Signed)
Patient returned your call.  Please call her at [phone].

## 2014-11-15 NOTE — Telephone Encounter (Signed)
I talked with her about this yesterday at the [facility].  It's early to get it filled.   If [facility] will hold the rx, then call it in.  Thanks.

## 2014-11-15 NOTE — Assessment & Plan Note (Signed)
Nontoxic, septra, fluids, ucx, f/u prn. She agrees.

## 2014-11-15 NOTE — Telephone Encounter (Signed)
Pharmac[facility] Lu [facility]uffel([place] as instructed by telephone. Prescription given to pharmacist to hold until it is due.   Left message on answering machine for patient to call back.

## 2014-11-15 NOTE — Telephone Encounter (Signed)
Pt left v/m [facility]; [facility] and pt received tramadol # 180 on 09/30/14 and 10/30/14. Pt request cb when refilled. Pt is aware cannot get filled until 1st of Dec.

## 2014-11-17 LAB — URINE CULTURE: Colony Count: >=100000

## 2014-12-04 ENCOUNTER — Telehealth: Payer: Self-pay | Admitting: *Deleted

## 2014-12-04 NOTE — Telephone Encounter (Signed)
Patient says she doesn't know where she gets her supplies from but doesn't need anything right now and asks that we dismiss the form.

## 2014-12-04 NOTE — Telephone Encounter (Signed)
Received a faxed form from [employer] requesting that form be completed and faxed back for diabetic supplies. Left message for patient to call back to confirm that she does want to get supplies from this company.

## 2014-12-08 ENCOUNTER — Encounter: Payer: Self-pay | Admitting: Family Medicine

## 2014-12-08 ENCOUNTER — Ambulatory Visit (INDEPENDENT_AMBULATORY_CARE_PROVIDER_SITE_OTHER): Payer: Medicare HMO | Admitting: Family Medicine

## 2014-12-08 VITALS — BP 104/58 | HR 87 | Temp 98.1°F | Ht 63.5 in | Wt 124.5 lb

## 2014-12-08 DIAGNOSIS — N390 Urinary tract infection, site not specified: Secondary | ICD-10-CM | POA: Insufficient documentation

## 2014-12-08 DIAGNOSIS — R300 Dysuria: Secondary | ICD-10-CM

## 2014-12-08 DIAGNOSIS — N3000 Acute cystitis without hematuria: Secondary | ICD-10-CM

## 2014-12-08 LAB — POCT URINALYSIS DIPSTICK
BILIRUBIN UA: NEGATIVE
GLUCOSE UA: NEGATIVE
KETONES UA: NEGATIVE
NITRITE UA: NEGATIVE
Spec Grav, UA: >=1.03
Urobilinogen, UA: 0.2
pH, UA: 6

## 2014-12-08 MED ORDER — CIPROFLOXACIN HCL 250 MG PO TABS: 250.0000 mg | ORAL_TABLET | Freq: Two times a day (BID) | ORAL | Status: DC

## 2014-12-08 NOTE — Patient Instructions (Addendum)
Drink more water  Take the cipro as directed -I sent it to cipro  Go back to the previous pads you used to use We will call you with urine culture result  Follow up with Dr Para Marchuncan in about 2 weeks   Update if not starting to improve in a week or if worsening

## 2014-12-08 NOTE — Progress Notes (Signed)
Pre visit review using our clinic review tool, if applicable. No additional management support is needed unless otherwise documented below in the visit note. 

## 2014-12-08 NOTE — Assessment & Plan Note (Signed)
?   If recurrent - was tx for e coli uti in nov  Did feel better in between cx sent tx with low dose cipro for 5 d  F/u with PCP in about 2 weeks for a re check   Update if not starting to improve in a week or if worsening   Enc to drink more water

## 2014-12-08 NOTE — Progress Notes (Signed)
   Subjective:    Patient ID: Dominique Clark, female    DOB: 04/07/1941, 73 y.o.   MRN: [id number]  HPI Here for uti   Last night - started having dysuria  Has baseline frequency/now some urgency  No blood in urine  No fever  No nausea Last night - low abdominal pain over bladder   She does not drink much water    Was tx 11/17 for e coli uti with septra  cx was pan sensitive   Results for orders placed or performed in visit on 12/08/14  POCT urinalysis dipstick  Result Value Ref Range   Color, UA yellow    Clarity, UA cloudy    Glucose, UA neg.    Bilirubin, UA neg.    Ketones, UA neg.    Spec Grav, UA >=1.030    Blood, UA Large    pH, UA 6.0    Protein, UA 30+    Urobilinogen, UA 0.2    Nitrite, UA neg.    Leukocytes, UA Trace       Review of Systems Review of Systems  Constitutional: Negative for fever, appetite change, fatigue and unexpected weight change.  Eyes: Negative for pain and visual disturbance.  Respiratory: Negative for cough and shortness of breath.   Cardiovascular: Negative for cp or palpitations    Gastrointestinal: Negative for nausea, diarrhea and constipation.  Genitourinary: pos  for urgency and frequency. neg for dysuria or blood in urine  Skin: Negative for pallor or rash   Neurological: Negative for weakness, light-headedness, numbness and headaches.  Hematological: Negative for adenopathy. Does not bruise/bleed easily.  Psychiatric/Behavioral: Negative for dysphoric mood. The patient is not nervous/anxious.         Objective:   Physical Exam  Constitutional: She appears well-developed and well-nourished. No distress.  HENT:  Head: Normocephalic and atraumatic.  Eyes: Conjunctivae are normal. Pupils are equal, round, and reactive to light. No scleral icterus.  Neck: Normal range of motion. Neck supple.  Cardiovascular: Normal rate and regular rhythm.   Pulmonary/Chest: Effort normal and breath sounds normal.  Abdominal: Soft. Bowel  sounds are normal. She exhibits no distension and no mass. There is tenderness in the suprapubic area. There is no rebound, no guarding and no CVA tenderness.  Lymphadenopathy:    She has no cervical adenopathy.  Neurological: She is alert.  Skin: Skin is warm and dry. She is not diaphoretic. No pallor.  Psychiatric: She has a normal mood and affect.          Assessment & Plan:   Problem List Items Addressed This Visit      Genitourinary   UTI (urinary tract infection) - Primary    ? If recurrent - was tx for e coli uti in nov  Did feel better in between cx sent tx with low dose cipro for 5 d  F/u with PCP in about 2 weeks for a re check   Update if not starting to improve in a week or if worsening   Enc to drink more water     Relevant Orders      Urine culture    Other Visit Diagnoses    Dysuria        Relevant Orders       POCT urinalysis dipstick (Completed)

## 2014-12-12 LAB — URINE CULTURE

## 2014-12-13 ENCOUNTER — Telehealth: Payer: Self-pay | Admitting: Family Medicine

## 2014-12-13 NOTE — Telephone Encounter (Signed)
Pt returned you call regarding lab results  Pt c/b # [phone]

## 2014-12-13 NOTE — Telephone Encounter (Signed)
Addressed through result notes  

## 2014-12-18 ENCOUNTER — Ambulatory Visit (INDEPENDENT_AMBULATORY_CARE_PROVIDER_SITE_OTHER): Payer: Medicare HMO | Admitting: Podiatry

## 2014-12-18 DIAGNOSIS — B351 Tinea unguium: Secondary | ICD-10-CM

## 2014-12-18 DIAGNOSIS — M79676 Pain in unspecified toe(s): Secondary | ICD-10-CM

## 2014-12-18 NOTE — Progress Notes (Signed)
Patient ID: Dominique Clark, female   DOB: 05/14/1941, 73 y.o.   MRN: [id number]  Subjective: Patient presents complaining of painful toenails walking wearing shoes  Objective: The toenails are incurvated, discolored and incurvated and tender to palpation 6-10 Callused nail groove lateral fourth right toe Incurvated lateral margin right hallux with callused nail groove  Assessment: Symptomatic onychomycoses 6-10 Callused nail groove fourth right toe and lateral margin right hallux  Plan: Debrided toenails 10 without a bleeding Debrided callused nail groove fourth right toe without bleeding Debrided callused on lateral aspect of right hallux without bleeding  Reappoint 3 months

## 2015-01-02 DIAGNOSIS — E11319 Type 2 diabetes mellitus with unspecified diabetic retinopathy without macular edema: Secondary | ICD-10-CM | POA: Diagnosis not present

## 2015-01-17 ENCOUNTER — Other Ambulatory Visit: Payer: Self-pay | Admitting: Family Medicine

## 2015-01-17 NOTE — Telephone Encounter (Signed)
Received refill request electronically from pharmacy. Last refill 09/29/14 with 2 refills. Last office visit 12/08/14/acute. Is it okay to refill medication?

## 2015-01-18 NOTE — Telephone Encounter (Signed)
Sent. Thanks.   

## 2015-02-02 ENCOUNTER — Encounter: Payer: Self-pay | Admitting: Family Medicine

## 2015-02-02 ENCOUNTER — Ambulatory Visit (INDEPENDENT_AMBULATORY_CARE_PROVIDER_SITE_OTHER): Payer: Commercial Managed Care - HMO | Admitting: Family Medicine

## 2015-02-02 VITALS — BP 126/72 | HR 84 | Temp 97.6°F | Wt 126.5 lb

## 2015-02-02 DIAGNOSIS — J441 Chronic obstructive pulmonary disease with (acute) exacerbation: Secondary | ICD-10-CM | POA: Diagnosis not present

## 2015-02-02 MED ORDER — DOXYCYCLINE HYCLATE 100 MG PO TABS: 100.0000 mg | ORAL_TABLET | Freq: Two times a day (BID) | ORAL | Status: DC

## 2015-02-02 MED ORDER — PREDNISONE 20 MG PO TABS: ORAL_TABLET | ORAL | Status: DC

## 2015-02-02 NOTE — Progress Notes (Signed)
Pre visit review using our clinic review tool, if applicable. No additional management support is needed unless otherwise documented below in the visit note.  Recently her son in law died.  I offered my condolences.    Recently with more cough, green/yellow sputum.  Using inhalers at baseline.  Started about 2-3 days ago.  HA.  Sick contacts.  Stuffy, nose bleed recently noted.  Some wheeze, more than normal.  No fevers.  Was more SOB last night, some better today.    Meds, vitals, and allergies reviewed.   ROS: See HPI.  Otherwise, noncontributory.  GEN: nad, alert and oriented HEENT: mucous membranes moist, tm w/o erythema, nasal exam w/o erythema, clear discharge noted,  OP with cobblestoning NECK: supple w/o LA CV: rrr.   PULM: ctab except for scant + exp wheeze and rare rhonchi, no inc wob EXT: no edema SKIN: no acute rash Speaking in complete sentences and not in distress.

## 2015-02-02 NOTE — Patient Instructions (Signed)
Take doxycyline twice a day.  Take prednisone with food.  Please schedule a physical for the spring.  Glad to see you.

## 2015-02-04 NOTE — Assessment & Plan Note (Signed)
Still okay for outpatient f/u.  Take doxycyline twice a day. Take prednisone with food.  She agrees.  F/u prn.  I asked her to schedule a CPE for the spring.  She'll consider that.

## 2015-02-05 DIAGNOSIS — Z0100 Encounter for examination of eyes and vision without abnormal findings: Secondary | ICD-10-CM | POA: Diagnosis not present

## 2015-02-07 ENCOUNTER — Telehealth: Payer: Self-pay | Admitting: *Deleted

## 2015-02-07 NOTE — Telephone Encounter (Signed)
Form for diabetic testing supplies in your IN box for completion. 

## 2015-02-08 NOTE — Telephone Encounter (Signed)
I'll work on the hard copy.  

## 2015-02-19 ENCOUNTER — Telehealth: Payer: Self-pay | Admitting: *Deleted

## 2015-02-19 NOTE — Telephone Encounter (Signed)
Diabetic Supplies:  Patient states she does use this company.  Form in your In Box

## 2015-02-20 NOTE — Telephone Encounter (Signed)
I'll work on the hard copy.  

## 2015-02-22 ENCOUNTER — Encounter: Payer: Self-pay | Admitting: Family Medicine

## 2015-02-22 ENCOUNTER — Ambulatory Visit (INDEPENDENT_AMBULATORY_CARE_PROVIDER_SITE_OTHER): Payer: Commercial Managed Care - HMO | Admitting: Family Medicine

## 2015-02-22 VITALS — BP 130/60 | HR 88 | Temp 98.1°F | Wt 128.5 lb

## 2015-02-22 DIAGNOSIS — J441 Chronic obstructive pulmonary disease with (acute) exacerbation: Secondary | ICD-10-CM

## 2015-02-22 DIAGNOSIS — M25561 Pain in right knee: Secondary | ICD-10-CM | POA: Diagnosis not present

## 2015-02-22 DIAGNOSIS — M25562 Pain in left knee: Secondary | ICD-10-CM | POA: Diagnosis not present

## 2015-02-22 MED ORDER — BUDESONIDE-FORMOTEROL FUMARATE 160-4.5 MCG/ACT IN AERO: INHALATION_SPRAY | RESPIRATORY_TRACT | Status: DC

## 2015-02-22 MED ORDER — TRAMADOL HCL 50 MG PO TABS: ORAL_TABLET | ORAL | Status: DC

## 2015-02-22 MED ORDER — PREDNISONE 20 MG PO TABS: ORAL_TABLET | ORAL | Status: DC

## 2015-02-22 MED ORDER — ALBUTEROL SULFATE HFA 108 (90 BASE) MCG/ACT IN AERS: 1.0000 | INHALATION_SPRAY | RESPIRATORY_TRACT | Status: DC | PRN

## 2015-02-22 MED ORDER — AMOXICILLIN-POT CLAVULANATE 875-125 MG PO TABS: 1.0000 | ORAL_TABLET | Freq: Two times a day (BID) | ORAL | Status: DC

## 2015-02-22 NOTE — Progress Notes (Signed)
Pre visit review using our clinic review tool, if applicable. No additional management support is needed unless otherwise documented below in the visit note.  Seen 20 days ago, was getting some better with pred and doxy.  "backslid" earlier this week.  More cough, sputum discolored again, voice changes.  Some wheeze.  No fevers.  No vomiting in the last 2 weeks.  She thought the prednisone irritated her stomach prev and caused the episode of vomiting about 3 weeks ago.  Still on her inhalers at baseline.  She had done well overall, until recently.    Continues to use tramadol daily for knee pain w/o ADE, does get some relief.  Needs refill.   Meds, vitals, and allergies reviewed.   ROS: See HPI.  Otherwise, noncontributory.  GEN: nad, alert and oriented HEENT: mucous membranes moist, tm w/o erythema, nasal exam w/o erythema, clear discharge noted,  OP with cobblestoning NECK: supple w/o LA CV: rrr.   PULM: B mild wheeze noted, no inc wob, no rales, cough noted with green sputum EXT: no edema SKIN: no acute rash

## 2015-02-22 NOTE — Patient Instructions (Signed)
Start augmentin.  Use the prednisone with food if you have more wheezing.  Continue your regular inhalers.  Take care.  Glad to see you.

## 2015-02-23 DIAGNOSIS — M25569 Pain in unspecified knee: Secondary | ICD-10-CM | POA: Insufficient documentation

## 2015-02-23 NOTE — Assessment & Plan Note (Signed)
Continue inhalers, start augmentin, she'll hold prednisone for now.  GI cautions given, start if pulmonary sx not improved.  She agrees.  Still okay for outpatient f/u.  Update me as needed.

## 2015-02-23 NOTE — Assessment & Plan Note (Signed)
Continue prn tramadol.  No ADE on med.  

## 2015-03-19 ENCOUNTER — Ambulatory Visit: Payer: Medicare HMO | Admitting: Podiatry

## 2015-03-28 ENCOUNTER — Encounter: Payer: Self-pay | Admitting: Family Medicine

## 2015-03-28 ENCOUNTER — Ambulatory Visit (INDEPENDENT_AMBULATORY_CARE_PROVIDER_SITE_OTHER): Payer: Commercial Managed Care - HMO | Admitting: Family Medicine

## 2015-03-28 ENCOUNTER — Ambulatory Visit (INDEPENDENT_AMBULATORY_CARE_PROVIDER_SITE_OTHER)
Admission: RE | Admit: 2015-03-28 | Discharge: 2015-03-28 | Disposition: A | Discharge status: Home / Self Care | Payer: Commercial Managed Care - HMO | Source: Ambulatory Visit | Attending: Family Medicine | Admitting: Family Medicine

## 2015-03-28 VITALS — BP 112/70 | HR 108 | Temp 97.9°F | Wt 124.8 lb

## 2015-03-28 DIAGNOSIS — J441 Chronic obstructive pulmonary disease with (acute) exacerbation: Secondary | ICD-10-CM | POA: Diagnosis not present

## 2015-03-28 DIAGNOSIS — R739 Hyperglycemia, unspecified: Secondary | ICD-10-CM | POA: Diagnosis not present

## 2015-03-28 DIAGNOSIS — J449 Chronic obstructive pulmonary disease, unspecified: Secondary | ICD-10-CM | POA: Diagnosis not present

## 2015-03-28 DIAGNOSIS — R05 Cough: Secondary | ICD-10-CM | POA: Diagnosis not present

## 2015-03-28 MED ORDER — PREDNISONE 20 MG PO TABS: ORAL_TABLET | ORAL | Status: DC

## 2015-03-28 MED ORDER — LEVOFLOXACIN 500 MG PO TABS: 500.0000 mg | ORAL_TABLET | Freq: Every day | ORAL | Status: DC

## 2015-03-28 NOTE — Progress Notes (Signed)
Pre visit review using our clinic review tool, if applicable. No additional management support is needed unless otherwise documented below in the visit note.  She was seen about 1 month ago, started on augmentin, held pred initially then started the pred taper.  She was better in the meantime but never fully resolved. She didn't have sig elevation sugars on pred (~140).    "I back slid" in the meantime.  More cough, with yellow sputum, darker than normal.  Sputum is normally white at baseline.  Voice is still altered.  No fevers.  Still on her inhalers at baseline.   Meds, vitals, and allergies reviewed.   ROS: See HPI.  Otherwise, noncontributory.  GEN: nad, alert and oriented HEENT: mucous membranes moist, tm w/o erythema, nasal exam w/o erythema, clear discharge noted,  OP with cobblestoning NECK: supple w/o LA CV: rrr.   PULM: no inc wob but cough and B scant wheeze noted, no focal dec in BS EXT: no edema SKIN: no acute rash

## 2015-03-28 NOTE — Patient Instructions (Signed)
Start levaquin today.  Take prednisone for 10 days with food.  Go to the lab on the way out.  We'll contact you with your lab report. Take care.  Glad to see you.

## 2015-03-29 LAB — CBC WITH DIFFERENTIAL/PLATELET
Basophils Absolute: 0 10*3/uL (ref 0.0–0.1)
Basophils Relative: 0.2 % (ref 0.0–3.0)
EOS ABS: 0.3 10*3/uL (ref 0.0–0.7)
EOS PCT: 2.7 % (ref 0.0–5.0)
HCT: 38 % (ref 36.0–46.0)
Hemoglobin: 12.4 g/dL (ref 12.0–15.0)
LYMPHS ABS: 2.5 10*3/uL (ref 0.7–4.0)
LYMPHS PCT: 21.3 % (ref 12.0–46.0)
MCHC: 32.6 g/dL (ref 30.0–36.0)
MCV: 88.3 fl (ref 78.0–100.0)
Monocytes Absolute: 0.8 10*3/uL (ref 0.1–1.0)
Monocytes Relative: 6.8 % (ref 3.0–12.0)
Neutro Abs: 8.2 10*3/uL — ABNORMAL HIGH (ref 1.4–7.7)
Neutrophils Relative %: 69 % (ref 43.0–77.0)
PLATELETS: 273 10*3/uL (ref 150.0–400.0)
RBC: 4.31 Mil/uL (ref 3.87–5.11)
RDW: 15.2 % (ref 11.5–15.5)
WBC: 11.9 10*3/uL — ABNORMAL HIGH (ref 4.0–10.5)

## 2015-03-29 LAB — COMPREHENSIVE METABOLIC PANEL
ALK PHOS: 49 U/L (ref 39–117)
ALT: 12 U/L (ref 0–35)
AST: 21 U/L (ref 0–37)
Albumin: 4.1 g/dL (ref 3.5–5.2)
BILIRUBIN TOTAL: 0.4 mg/dL (ref 0.2–1.2)
BUN: 18 mg/dL (ref 6–23)
CO2: 28 meq/L (ref 19–32)
Calcium: 10.2 mg/dL (ref 8.4–10.5)
Chloride: 103 mEq/L (ref 96–112)
Creatinine, Ser: 1.11 mg/dL (ref 0.40–1.20)
GFR: 50.98 mL/min — ABNORMAL LOW (ref >60.00)
GLUCOSE: 101 mg/dL — AB (ref 70–99)
Potassium: 5 mEq/L (ref 3.5–5.1)
SODIUM: 136 meq/L (ref 135–145)
Total Protein: 7.1 g/dL (ref 6.0–8.3)

## 2015-03-29 LAB — HEMOGLOBIN A1C: Hgb A1c MFr Bld: 6.2 % (ref 4.6–6.5)

## 2015-03-29 NOTE — Assessment & Plan Note (Signed)
Presumed, she's had relapsing sx, never with complete improvement.  Will check CXR today and basic labs.  Will rx for longer pred course, start levaquin, continue her inhalers.  She agrees.  Still okay for outpatient f/u.

## 2015-03-30 ENCOUNTER — Encounter: Payer: Self-pay | Admitting: Family Medicine

## 2015-04-10 ENCOUNTER — Telehealth: Payer: Self-pay | Admitting: *Deleted

## 2015-04-10 NOTE — Telephone Encounter (Signed)
Received letter from [facility] saying they won't be covering ProAir HFA 90 mcg Inhaler.  I printed off the alternatives that they will cover and attached them to the letter.  Can you use an alternative that is covered?  Placed in your In Box.

## 2015-04-11 NOTE — Telephone Encounter (Signed)
I'll check the hard copy. Thanks.  

## 2015-05-10 ENCOUNTER — Ambulatory Visit: Payer: Commercial Managed Care - HMO | Admitting: Family Medicine

## 2015-05-12 ENCOUNTER — Ambulatory Visit (INDEPENDENT_AMBULATORY_CARE_PROVIDER_SITE_OTHER): Payer: Commercial Managed Care - HMO | Admitting: Family Medicine

## 2015-05-12 ENCOUNTER — Encounter: Payer: Self-pay | Admitting: Family Medicine

## 2015-05-12 VITALS — BP 120/70 | HR 70 | Temp 97.6°F | Wt 126.0 lb

## 2015-05-12 DIAGNOSIS — N3090 Cystitis, unspecified without hematuria: Secondary | ICD-10-CM | POA: Diagnosis not present

## 2015-05-12 LAB — POCT URINALYSIS DIPSTICK
BILIRUBIN UA: NEGATIVE
GLUCOSE UA: NEGATIVE
Ketones, UA: 0.5
NITRITE UA: NEGATIVE
PROTEIN UA: POSITIVE
SPEC GRAV UA: 1.025
UROBILINOGEN UA: 0.2
pH, UA: 5

## 2015-05-12 MED ORDER — CEPHALEXIN 500 MG PO CAPS: 500.0000 mg | ORAL_CAPSULE | Freq: Two times a day (BID) | ORAL | Status: AC

## 2015-05-12 NOTE — Progress Notes (Signed)
   Subjective:    Patient ID: Dominique Clark, female    DOB: 02/25/1941, 74 y.o.   MRN: [id number]  HPI Dysuria- 'it justs hurts'.  sxs started suddenly at 3am.  'i had a touch of it on Wednesday but i made an appt and it went away'.  Denies frequency, + urgency, hesitancy.  No blood in urine.  No fevers.  No back pain.  Hx of cystitis and this feels similar.   Review of Systems For ROS see HPI     Objective:   Physical Exam  Constitutional: She is oriented to person, place, and time. She appears well-developed and well-nourished. No distress.  HENT:  Head: Normocephalic and atraumatic.  Abdominal: Soft. She exhibits no distension. There is tenderness (+ suprapubic but no CVA tenderness).  Musculoskeletal: She exhibits no edema.  Neurological: She is alert and oriented to person, place, and time.  Skin: Skin is warm and dry.  Psychiatric: She has a normal mood and affect. Her behavior is normal. Thought content normal.  Vitals reviewed.         Assessment & Plan:

## 2015-05-12 NOTE — Addendum Note (Signed)
Addended by: Marlene LardMILLER, Uri Covey M on: 05/12/2015 12:49 PM   Modules accepted: Orders

## 2015-05-12 NOTE — Patient Instructions (Signed)
Follow up as needed Start the Keflex twice daily Drink plenty of fluids Call with any questions or concerns Hang in there!!! 

## 2015-05-12 NOTE — Assessment & Plan Note (Signed)
New to provider, recurrent for pt.  sxs and UA consistent w/ infxn.  Start abx.  Reviewed supportive care and red flags that should prompt return.  Pt expressed understanding and is in agreement w/ plan.

## 2015-05-14 ENCOUNTER — Telehealth: Payer: Self-pay | Admitting: *Deleted

## 2015-05-14 NOTE — Telephone Encounter (Signed)
Seen this weekend, at Sat clinic.  Thanks.

## 2015-05-14 NOTE — Telephone Encounter (Signed)
**  Appointment with Dr. Beverely Lowabori 05/12/15**  PLEASE NOTE: All timestamps contained within this report are represented as [place] Time. CONFIDENTIALTY NOTICE: This fax transmission is intended only for the addressee. It contains information that is legally privileged, confidential or otherwise protected from use or disclosure. If you are not the intended recipient, you are strictly prohibited from reviewing, disclosing, copying using or disseminating any of this information or taking any action in reliance on or regarding this information. If you have received this fax in error, please notify us immediately by telephone so that we can arrange for its return to us. Phone: [phone], Toll-Free: [phone], Fax: [phone] Page: 1 of 2 Call Id: [id number] [facility] - Client TELEPHONE ADVICE RECORD [facility] Patient Name: Dominique Clark Gender: Female DOB: 12/02/1941 Age: 74 Y 9 M 9 D Return Phone Number: [phone] (Primary) Address: City[place]State[place]: [place] [id number] Client [facility] Night Physician Raechel Acheuncan, Shaw Contact Type Call Call Type Triage / Clinical Relationship To Patient Self Return Phone Number [phone] (Primary) Chief Complaint Urination Pain Initial Comment Caller states she has a bladder infection and kidney infection PreDisposition Call Doctor Nurse Assessment Nurse: Stefano GaulStringer, RN, Dwana CurdVera Date/Time Lamount Cohen(Eastern Time): 05/12/2015 8:03:09 AM Confirm and document reason for call. If symptomatic, describe symptoms. ---Caller states she thinks she has bladder infection. She is having pain when she urinates. She usually takes Cipro. No fever or back pain. Has the patient traveled out of the country within the last 30 days? ---No Does the patient require triage? ---Yes Related visit to physician within the last 2 weeks?  ---No Does the PT have any chronic conditions? (i.e. diabetes, asthma, etc.) ---Yes List chronic conditions. ---diabetes; irregular heart beat Guidelines Guideline Title Affirmed Question Affirmed Notes Nurse Date/Time Lamount Cohen(Eastern Time) Urination Pain - Female Diabetes mellitus or weak immune system (e.g., HIV positive, cancer chemotherapy, transplant patient) Stefano GaulStringer, RN, Dwana CurdVera 05/12/2015 8:05:47 AM Disp. Time Lamount Cohen(Eastern Time) Disposition Final User 05/12/2015 8:09:44 AM See Physician within 4 Hours (or PCP triage) Yes Stefano GaulStringer, RN, Clerance LavVera Caller Understands: Yes Disagree/Comply: Comply PLEASE NOTE: All timestamps contained within this report are represented as [place] Standard Time. CONFIDENTIALTY NOTICE: This fax transmission is intended only for the addressee. It contains information that is legally privileged, confidential or otherwise protected from use or disclosure. If you are not the intended recipient, you are strictly prohibited from reviewing, disclosing, copying using or disseminating any of this information or taking any action in reliance on or regarding this information. If you have received this fax in error, please notify us immediately by telephone so that we can arrange for its return to us. Phone: [phone], Toll-Free: [phone], Fax: [phone] Page: 2 of 2 Call Id: [id number] Care Advice Given Per Guideline SEE PHYSICIAN WITHIN 4 HOURS (or PCP triage): * IF NO PCP TRIAGE: You need to be seen. Go to _______________ (ED/UCC or office if it will be open) within the next 3 or 4 hours. Go sooner if you become worse. CALL BACK IF: * You become worse. CARE ADVICE given per Urination Pain - Female (Adult) guideline. After Care Instructions Given Call Event Type User Date / Time Description Referrals  [facility]

## 2015-05-16 LAB — URINE CULTURE: Colony Count: >=100000

## 2015-05-31 ENCOUNTER — Ambulatory Visit (INDEPENDENT_AMBULATORY_CARE_PROVIDER_SITE_OTHER): Payer: Commercial Managed Care - HMO | Admitting: Primary Care

## 2015-05-31 ENCOUNTER — Encounter: Payer: Self-pay | Admitting: Primary Care

## 2015-05-31 VITALS — BP 126/72 | HR 76 | Temp 97.5°F | Ht 63.5 in | Wt 127.0 lb

## 2015-05-31 DIAGNOSIS — B370 Candidal stomatitis: Secondary | ICD-10-CM

## 2015-05-31 DIAGNOSIS — M1990 Unspecified osteoarthritis, unspecified site: Secondary | ICD-10-CM

## 2015-05-31 MED ORDER — TRAMADOL HCL 50 MG PO TABS: ORAL_TABLET | ORAL | Status: DC

## 2015-05-31 MED ORDER — NYSTATIN 100000 UNIT/ML MT SUSP: OROMUCOSAL | Status: DC

## 2015-05-31 NOTE — Patient Instructions (Signed)
Your strep test was negative. You do have some thrush present to the back of your throat. We will treat this with Nystatin. Swish and swallow 5 ml four times daily for 7 days. You make take tylenol, use chloraseptic throat spray, and warm salt gargles for pain. Ensure that you are rinsing your mouth after use of your inhalers.  It was nice meeting you!  Pharyngitis Pharyngitis is redness, pain, and swelling (inflammation) of your pharynx.  CAUSES  Pharyngitis is usually caused by infection. Most of the time, these infections are from viruses (viral) and are part of a cold. However, sometimes pharyngitis is caused by bacteria (bacterial). Pharyngitis can also be caused by allergies. Viral pharyngitis may be spread from person to person by coughing, sneezing, and personal items or utensils (cups, forks, spoons, toothbrushes). Bacterial pharyngitis may be spread from person to person by more intimate contact, such as kissing.  SIGNS AND SYMPTOMS  Symptoms of pharyngitis include:   Sore throat.   Tiredness (fatigue).   Low-grade fever.   Headache.  Joint pain and muscle aches.  Skin rashes.  Swollen lymph nodes.  Plaque-like film on throat or tonsils (often seen with bacterial pharyngitis). DIAGNOSIS  Your health care provider will ask you questions about your illness and your symptoms. Your medical history, along with a physical exam, is often all that is needed to diagnose pharyngitis. Sometimes, a rapid strep test is done. Other lab tests may also be done, depending on the suspected cause.  TREATMENT  Viral pharyngitis will usually get better in 3-4 days without the use of medicine. Bacterial pharyngitis is treated with medicines that kill germs (antibiotics).  HOME CARE INSTRUCTIONS   Drink enough water and fluids to keep your urine clear or pale yellow.   Only take over-the-counter or prescription medicines as directed by your health care provider:   If you are  prescribed antibiotics, make sure you finish them even if you start to feel better.   Do not take aspirin.   Get lots of rest.   Gargle with 8 oz of salt water ( tsp of salt per 1 qt of water) as often as every 1-2 hours to soothe your throat.   Throat lozenges (if you are not at risk for choking) or sprays may be used to soothe your throat. SEEK MEDICAL CARE IF:   You have large, tender lumps in your neck.  You have a rash.  You cough up green, yellow-brown, or bloody spit. SEEK IMMEDIATE MEDICAL CARE IF:   Your neck becomes stiff.  You drool or are unable to swallow liquids.  You vomit or are unable to keep medicines or liquids down.  You have severe pain that does not go away with the use of recommended medicines.  You have trouble breathing (not caused by a stuffy nose). MAKE SURE YOU:   Understand these instructions.  Will watch your condition.  Will get help right away if you are not doing well or get worse. Document Released: 12/15/2005 Document Revised: 10/05/2013 Document Reviewed: 08/22/2013 [facility] Patient Information 2015 [place]. This information is not intended to replace advice given to you by your health care provider. Make sure you discuss any questions you have with your health care provider.

## 2015-05-31 NOTE — Progress Notes (Signed)
Pre visit review using our clinic review tool, if applicable. No additional management support is needed unless otherwise documented below in the visit note. 

## 2015-05-31 NOTE — Progress Notes (Signed)
Subjective:    Patient ID: Thomes DinningElsie I Rossie, female    DOB: 04/04/1941, 74 y.o.   MRN: [id number]  HPI  Ms. Haverstick is a 74 year old female who presents today with a chief complaint of sore throat. Her sore throat began 2 days ago with hoarseness to her voice. She's also reporting a mild productive cough (light yellow, which is normal for her) and more frequent sneezing. Denies fevers, chills. She has not taken any medication for her pain but did try gargling warm salt water which has helped to reduce her pain. Her worst symptom is sore throat. She recently attended a funeral in which she was surround by a large group of people including children.  Review of Systems  Constitutional: Negative for fever and chills.  HENT: Positive for congestion, sinus pressure, sneezing and sore throat. Negative for ear pain.   Respiratory: Positive for cough.   Cardiovascular: Negative for chest pain.  Gastrointestinal: Positive for nausea. Negative for vomiting.  Musculoskeletal: Negative for myalgias.       Past Medical History  Diagnosis Date  . Diabetes mellitus     type 2  . Gastritis     via EGD  . Hiatal hernia     s/p dilation 2004  . Diverticulosis     s/p colonoscopy  . COPD (chronic obstructive pulmonary disease)   . Stress incontinence, female   . Osteopenia     History   Social History  . Marital Status: Single    Spouse Name: N/A  . Number of Children: 2  . Years of Education: N/A   Occupational History  . [employer]      labtech   Social History Main Topics  . Smoking status: Former Smoker    Types: Cigarettes    Quit date: 12/29/1988  . Smokeless tobacco: Never Used     Comment: 20 or more years   . Alcohol Use: No  . Drug Use: No  . Sexual Activity: Not on file   Other Topics Concern  . Not on file   Social History Narrative   Marital Status: divorced   Children: 2children, 1 grandchild   Occupation: retired from [employer]   [employer]    Past Surgical History  Procedure Laterality Date  . Bladder repair  9/04    bladder tack   . Breast cyst excision Right 1972  . Tonsillectomy      age 74  . Total abdominal hysterectomy      Family History  Problem Relation Age of Onset  . Stroke Mother     No Known Allergies  Current Outpatient Prescriptions on File Prior to Visit  Medication Sig Dispense Refill  . albuterol (PROVENTIL HFA;VENTOLIN HFA) 108 (90 BASE) MCG/ACT inhaler Inhale 1-2 puffs into the lungs every 4 (four) hours as needed for wheezing. 1 Inhaler 12  . ASSURE COMFORT LANCETS 30G MISC     . Blood Glucose Calibration (CARESENS CONTROL A) SOLN     . budesonide-formoterol (SYMBICORT) 160-4.5 MCG/ACT inhaler INHALE 2 PUFFS EVERY 12 HOURS TO PREVENTCOUGH OR WHEEZING--RINSE, GARGLE, & SPITAFTER EACH USE. 10.2 g 12  . Esomeprazole Magnesium (NEXIUM PO) Take by mouth daily.    . fluticasone (FLONASE) 50 MCG/ACT nasal spray INHALE 2 SPRAYS INTO EACH NOSTRIL EVERY DAY. 16 g 5  . guaiFENesin (MUCINEX) 600 MG 12 hr tablet Take 1,200 mg by mouth 2 (two) times daily as needed.    . meloxicam (  MOBIC) 15 MG tablet Take 1 tablet (15 mg total) by mouth daily. 90 tablet 3  . metFORMIN (GLUCOPHAGE) 500 MG tablet Take 1 tablet (500 mg total) by mouth 2 (two) times daily with a meal. 180 tablet 3  . metoprolol succinate (TOPROL-XL) 25 MG 24 hr tablet Take 1 tablet (25 mg total) by mouth daily. 90 tablet 3  . traMADol (ULTRAM) 50 MG tablet TAKE 1 TO 2 TABLETS BY MOUTH EVERY 8 HOURS AS NEEDED FOR PAIN. 180 tablet 2  . levofloxacin (LEVAQUIN) 500 MG tablet Take 1 tablet (500 mg total) by mouth daily. (Patient not taking: Reported on 05/12/2015) 7 tablet 0  . predniSONE (DELTASONE) 20 MG tablet 2 tabs po for 5 days, then 1 tab po for 5 days. With food. (Patient not taking: Reported on 05/12/2015) 15 tablet 0  . triamcinolone cream (KENALOG) 0.5 % Apply topically 3 (three) times daily as needed. For itching (Patient not  taking: Reported on 05/12/2015) 30 g 0   No current facility-administered medications on file prior to visit.    BP 126/72 mmHg  Pulse 76  Temp(Src) 97.5 F (36.4 C) (Oral)  Ht 5' 3.5" (1.613 m)  Wt 127 lb (57.607 kg)  BMI 22.14 kg/m2  SpO2 96%    Objective:   Physical Exam  Constitutional: She does not appear ill.  HENT:  Nose: Right sinus exhibits maxillary sinus tenderness. Right sinus exhibits no frontal sinus tenderness. Left sinus exhibits maxillary sinus tenderness. Left sinus exhibits no frontal sinus tenderness.  Mouth/Throat: Posterior oropharyngeal erythema present. No oropharyngeal exudate or posterior oropharyngeal edema.  Thrush noted to pharynx. No exudate  Cardiovascular: Normal rate and regular rhythm.   Pulmonary/Chest: Effort normal and breath sounds normal. She has no rales.  Skin: Skin is warm and dry.          Assessment & Plan:  Pharyngitis:  Likely due to viral or allergies. Rapid strep negative: completed due to recent interaction with a large group of people. Oral thrush noted to posterior pharynx. Treat with Nystatin oral suspension x 7 days. Continue warm salt gargles. Tylenol for pain. Follow up if no improvement or symptoms worsen.

## 2015-07-04 ENCOUNTER — Other Ambulatory Visit: Payer: Self-pay | Admitting: Family Medicine

## 2015-07-04 ENCOUNTER — Telehealth: Payer: Self-pay | Admitting: *Deleted

## 2015-07-04 DIAGNOSIS — M1990 Unspecified osteoarthritis, unspecified site: Secondary | ICD-10-CM

## 2015-07-04 MED ORDER — TRAMADOL HCL 50 MG PO TABS: ORAL_TABLET | ORAL | Status: DC

## 2015-07-04 NOTE — Telephone Encounter (Signed)
Left message asking pt to call office please schedule appointment °

## 2015-07-04 NOTE — Telephone Encounter (Signed)
Rx called to pharmacy. Please schedule appointment as instructed.

## 2015-07-04 NOTE — Telephone Encounter (Signed)
Received refill request for tramadol. Last rx written on 05/31/15 for #180 by Jae DireKate. No future appt scheduled, and I couldn't find hx of any appt other than acute for cough in over 1 year. Ok to fill?

## 2015-07-04 NOTE — Telephone Encounter (Signed)
Please call in.  Thanks.  Schedule CPE when possible.  Thanks.

## 2015-07-04 NOTE — Telephone Encounter (Signed)
Lab appointment 10/3 cpx 10/6 Pt aware  Please close

## 2015-07-19 ENCOUNTER — Telehealth: Payer: Self-pay

## 2015-07-19 NOTE — Telephone Encounter (Signed)
Left a message for patient to return my call about having a Mammogram set up. Will await call back.  

## 2015-08-20 ENCOUNTER — Telehealth: Payer: Self-pay

## 2015-08-20 NOTE — Telephone Encounter (Signed)
PLEASE NOTE: All timestamps contained within this report are represented as [place] Standard Time. CONFIDENTIALTY NOTICE: This fax transmission is intended only for the addressee. It contains information that is legally privileged, confidential or otherwise protected from use or disclosure. If you are not the intended recipient, you are strictly prohibited from reviewing, disclosing, copying using or disseminating any of this information or taking any action in reliance on or regarding this information. If you have received this fax in error, please notify us immediately by telephone so that we can arrange for its return to [place]. Phone: [phone], Toll-Free: [phone], Fax: [phone] Page: 1 of 2 Call Id: [id number] [facility] Night - Client TELEPHONE ADVICE RECORD [facility] Patient Name: Dominique Clark Gender: Female DOB: December 19, 1941 Age: 74 Y 15 D Return Phone Number: [phone] (Primary) Address: City/State/Zip: [place] Client [facility] Night - Client Client Site [facility] - Night Physician Raechel Ache Contact Type Call Call Type Triage / Clinical Relationship To Patient Self Return Phone Number [phone] (Primary) Chief Complaint BREATHING - fast, heavy or wheezing Initial Comment caller states she is having a hard time breathing - is wheezing and just does not feel well PreDisposition Call Doctor Nurse Assessment Nurse: Enid Skeens, RN, Arline Asp Date/Time (Eastern Time): 08/18/2015 9:01:18 AM Confirm and document reason for call. If symptomatic, describe symptoms. ---Caller states she is having a hard time breathing - is wheezing and just does not feel well Has the patient traveled out of the country within the last 30 days? ---No Does the patient require triage? ---Yes Related visit to physician within the last 2 weeks? ---No Does the PT have any chronic conditions? (i.e.  diabetes, asthma, etc.) ---Yes List chronic conditions. ---COPD, diabetic Guidelines Guideline Title Affirmed Question Affirmed Notes Nurse Date/Time Lamount Cohen Time) Weakness (Generalized) and Fatigue Poor fluid intake probably caused the weakness (all triage questions negative) Hathaway, RN, Arline Asp 08/18/2015 9:06:31 AM Disp. Time Lamount Cohen Time) Disposition Final User 08/18/2015 8:57:45 AM Send to Urgent Queue Lesia Sago 08/18/2015 9:11:31 AM Home Care Yes Enid Skeens, RN, Ave Filter Understands: Yes Disagree/Comply: Comply Care Advice Given Per Guideline PLEASE NOTE: All timestamps contained within this report are represented as [place] Standard Time. CONFIDENTIALTY NOTICE: This fax transmission is intended only for the addressee. It contains information that is legally privileged, confidential or otherwise protected from use or disclosure. If you are not the intended recipient, you are strictly prohibited from reviewing, disclosing, copying using or disseminating any of this information or taking any action in reliance on or regarding this information. If you have received this fax in error, please notify us immediately by telephone so that we can arrange for its return to [place]. Phone: [phone], Toll-Free: [phone], Fax: [phone] Page: 2 of 2 Call Id: [id number] Care Advice Given Per Guideline HOME CARE: You should be able to treat this at home. REASSURANCE: * Not drinking enough fluids and being a Ribas dehydrated is a common cause of mild weakness. * During hot weather you sweat more, and can become dehydrated more easily. FLUIDS: Drink several glasses of fruit juice, other clear fluids or water. This will improve hydration and blood glucose. REST: Lie down with feet elevated for 1 hour. This will improve circulation and increase blood flow to the brain. CALL BACK IF: * Passes out (faints) * You become worse. * Still feeling weak after 2 hours of rest and fluids CARE  ADVICE given per Weakness and Fatigue (Adult)  guideline. After Care Instructions Given Call Event Type User Date / Time Description

## 2015-08-20 NOTE — Telephone Encounter (Signed)
Please call and get update on patient. Thanks.  

## 2015-08-20 NOTE — Telephone Encounter (Signed)
Patient says she is feeling much better.  Patient feels like she probably over-did it because she had mowed 2 yards.  Nurse suggested that she get some rest and drink fluids and she has felt much better since.  Patient says she felt good yesterday and today.  She has started taking a "sleepy vitamin" that is a gummy vitamin with Melatonin in it.  She says she is sleeping good and it helps her RLS.

## 2015-08-21 NOTE — Telephone Encounter (Signed)
Noted, thanks!

## 2015-09-21 DIAGNOSIS — N3000 Acute cystitis without hematuria: Secondary | ICD-10-CM | POA: Diagnosis not present

## 2015-09-21 DIAGNOSIS — N393 Stress incontinence (female) (male): Secondary | ICD-10-CM | POA: Diagnosis not present

## 2015-09-30 ENCOUNTER — Other Ambulatory Visit: Payer: Self-pay | Admitting: Family Medicine

## 2015-09-30 DIAGNOSIS — R739 Hyperglycemia, unspecified: Secondary | ICD-10-CM

## 2015-09-30 DIAGNOSIS — M8580 Other specified disorders of bone density and structure, unspecified site: Secondary | ICD-10-CM

## 2015-10-01 ENCOUNTER — Other Ambulatory Visit (INDEPENDENT_AMBULATORY_CARE_PROVIDER_SITE_OTHER): Payer: Commercial Managed Care - HMO

## 2015-10-01 DIAGNOSIS — R739 Hyperglycemia, unspecified: Secondary | ICD-10-CM

## 2015-10-01 DIAGNOSIS — M8580 Other specified disorders of bone density and structure, unspecified site: Secondary | ICD-10-CM

## 2015-10-01 LAB — COMPREHENSIVE METABOLIC PANEL
ALBUMIN: 4 g/dL (ref 3.5–5.2)
ALT: 12 U/L (ref 0–35)
AST: 23 U/L (ref 0–37)
Alkaline Phosphatase: 54 U/L (ref 39–117)
BUN: 16 mg/dL (ref 6–23)
CHLORIDE: 104 meq/L (ref 96–112)
CO2: 29 mEq/L (ref 19–32)
Calcium: 9.9 mg/dL (ref 8.4–10.5)
Creatinine, Ser: 0.92 mg/dL (ref 0.40–1.20)
GFR: 63.22 mL/min (ref >60.00)
Glucose, Bld: 97 mg/dL (ref 70–99)
POTASSIUM: 4.3 meq/L (ref 3.5–5.1)
SODIUM: 142 meq/L (ref 135–145)
Total Bilirubin: 0.4 mg/dL (ref 0.2–1.2)
Total Protein: 6.9 g/dL (ref 6.0–8.3)

## 2015-10-01 LAB — LIPID PANEL
CHOL/HDL RATIO: 3
Cholesterol: 163 mg/dL (ref 0–200)
HDL: 62.9 mg/dL (ref >39.00)
LDL Cholesterol: 84 mg/dL (ref 0–99)
NONHDL: 99.84
Triglycerides: 78 mg/dL (ref 0.0–149.0)
VLDL: 15.6 mg/dL (ref 0.0–40.0)

## 2015-10-01 LAB — HEMOGLOBIN A1C: HEMOGLOBIN A1C: 5.9 % (ref 4.6–6.5)

## 2015-10-01 LAB — VITAMIN D 25 HYDROXY (VIT D DEFICIENCY, FRACTURES): VITD: 45.53 ng/mL (ref 30.00–100.00)

## 2015-10-04 ENCOUNTER — Encounter: Payer: Self-pay | Admitting: Family Medicine

## 2015-10-04 ENCOUNTER — Ambulatory Visit (INDEPENDENT_AMBULATORY_CARE_PROVIDER_SITE_OTHER): Payer: Commercial Managed Care - HMO | Admitting: Family Medicine

## 2015-10-04 VITALS — BP 126/64 | HR 73 | Temp 97.8°F | Ht 63.0 in | Wt 128.5 lb

## 2015-10-04 DIAGNOSIS — J449 Chronic obstructive pulmonary disease, unspecified: Secondary | ICD-10-CM

## 2015-10-04 DIAGNOSIS — M25562 Pain in left knee: Secondary | ICD-10-CM

## 2015-10-04 DIAGNOSIS — Z0000 Encounter for general adult medical examination without abnormal findings: Secondary | ICD-10-CM | POA: Diagnosis not present

## 2015-10-04 DIAGNOSIS — M25561 Pain in right knee: Secondary | ICD-10-CM

## 2015-10-04 DIAGNOSIS — M1990 Unspecified osteoarthritis, unspecified site: Secondary | ICD-10-CM | POA: Diagnosis not present

## 2015-10-04 DIAGNOSIS — Z23 Encounter for immunization: Secondary | ICD-10-CM | POA: Diagnosis not present

## 2015-10-04 DIAGNOSIS — Z7189 Other specified counseling: Secondary | ICD-10-CM

## 2015-10-04 DIAGNOSIS — Z1211 Encounter for screening for malignant neoplasm of colon: Secondary | ICD-10-CM

## 2015-10-04 DIAGNOSIS — R739 Hyperglycemia, unspecified: Secondary | ICD-10-CM

## 2015-10-04 DIAGNOSIS — R1310 Dysphagia, unspecified: Secondary | ICD-10-CM

## 2015-10-04 MED ORDER — ALBUTEROL SULFATE HFA 108 (90 BASE) MCG/ACT IN AERS: 1.0000 | INHALATION_SPRAY | RESPIRATORY_TRACT | Status: DC | PRN

## 2015-10-04 MED ORDER — METOPROLOL SUCCINATE ER 25 MG PO TB24: 25.0000 mg | ORAL_TABLET | Freq: Every day | ORAL | Status: DC

## 2015-10-04 MED ORDER — MELOXICAM 15 MG PO TABS: 15.0000 mg | ORAL_TABLET | Freq: Every day | ORAL | Status: DC

## 2015-10-04 MED ORDER — FLUTICASONE PROPIONATE 50 MCG/ACT NA SUSP: NASAL | Status: DC

## 2015-10-04 MED ORDER — TRAMADOL HCL 50 MG PO TABS: ORAL_TABLET | ORAL | Status: DC

## 2015-10-04 MED ORDER — BUDESONIDE-FORMOTEROL FUMARATE 160-4.5 MCG/ACT IN AERO: INHALATION_SPRAY | RESPIRATORY_TRACT | Status: DC

## 2015-10-04 MED ORDER — METFORMIN HCL 500 MG PO TABS: 500.0000 mg | ORAL_TABLET | Freq: Every day | ORAL | Status: DC

## 2015-10-04 NOTE — Progress Notes (Signed)
Pre visit review using our clinic review tool, if applicable. No additional management support is needed unless otherwise documented below in the visit note.  I have personally reviewed the Medicare Annual Wellness questionnaire and have noted 1. The patient's medical and social history 2. Their use of alcohol, tobacco or illicit drugs 3. Their current medications and supplements 4. The patient's functional ability including ADL's, fall risks, home safety risks and hearing or visual             impairment. 5. Diet and physical activities 6. Evidence for depression or mood disorders  The patients weight, height, BMI have been recorded in the chart and visual acuity is per eye clinic.  I have made referrals, counseling and provided education to the patient based review of the above and I have provided the pt with a written personalized care plan for preventive services.  Provider list updated- see scanned forms.  Routine anticipatory guidance given to patient.  See health maintenance.  Flu 2016 Shingles d/ wpt.  PNA 2016 Tetanus prev done  D/w patient [id number] for colon cancer screening, including IFOB vs. colonoscopy.  Risks and benefits of both were discussed and patient voiced understanding.  Pt elects [id number].  Breast cancer screening declined.  D/w pt.  DXA declined, d/w pt.  Advance directive- daughter Mauricia Area designated if patient were incapacitated.   Cognitive function addressed- see scanned forms- and if abnormal then additional documentation follows.   H/o dysphagia from prev- this is a known issue.  She declined GI f/u at this point.  She'll let me know if sx are progressive.   COPD sx.  Worse with allergy exposures, ie cutting grass.  She takes preventive measures, ie a pollen mask.  She doesn't have acutely worsening sx.  D/w pt about preventive meds, she is compliant.    Hyperglycemia.  Sugar is improved on check here, see labs.  D/w pt.  No checks at home.  D/w pt  about metformin dosing.    Joint aches.  Still with mult joint arthritis (knees and shoulders), no ADE on tramadol, taking ~6 per day, with effect.  She is clearly more functional with the medicine as dosed.    PMH and SH reviewed  Meds, vitals, and allergies reviewed.   ROS: See HPI.  Otherwise negative.    GEN: nad, alert and oriented HEENT: mucous membranes moist NECK: supple w/o LA CV: rrr. PULM: ctab except for an exceeding faint minimal wheeze B, no inc wob ABD: soft, +bs EXT: no edema SKIN: no acute rash

## 2015-10-04 NOTE — Patient Instructions (Addendum)
Check with your insurance to see if they will cover the shingles shot. Cut back to 1 metformin a day.  I want to recheck your sugar test in about 3-4 months.   Take care.  Glad to see you.   Go to the lab on the way out.  We'll contact you with your lab report (stool cards).

## 2015-10-08 DIAGNOSIS — Z7189 Other specified counseling: Secondary | ICD-10-CM | POA: Insufficient documentation

## 2015-10-08 DIAGNOSIS — Z0000 Encounter for general adult medical examination without abnormal findings: Secondary | ICD-10-CM | POA: Insufficient documentation

## 2015-10-08 NOTE — Assessment & Plan Note (Signed)
Flu 2016 Shingles d/ wpt.  PNA 2016 Tetanus prev done  D/w patient [id number] for colon cancer screening, including IFOB vs. colonoscopy.  Risks and benefits of both were discussed and patient voiced understanding.  Pt elects [id number].  Breast cancer screening declined.  D/w pt.  DXA declined, d/w pt.  Advance directive- daughter Mauricia Area designated if patient were incapacitated.   Cognitive function addressed- see scanned forms- and if abnormal then additional documentation follows.

## 2015-10-08 NOTE — Assessment & Plan Note (Signed)
Continue tramadol for now.  No ADE on med.  Clearly more functional on med per patient report.

## 2015-10-08 NOTE — Assessment & Plan Note (Signed)
Continue as is for now.  She'll update me if worsening.  No blood in stool.  No emergent sx. Mild sx currently

## 2015-10-08 NOTE — Assessment & Plan Note (Signed)
Minimal sx at this point, would continue current meds as is.  She agrees. Reviewed use and follow up precautions.

## 2015-10-08 NOTE — Assessment & Plan Note (Signed)
Cut back to 1 metformin a day. recheck sugar in about 3-4 months.  D/w pt about diet.  She agrees with plan.

## 2015-11-12 ENCOUNTER — Encounter: Payer: Self-pay | Admitting: Internal Medicine

## 2015-11-12 ENCOUNTER — Ambulatory Visit (INDEPENDENT_AMBULATORY_CARE_PROVIDER_SITE_OTHER): Payer: Commercial Managed Care - HMO | Admitting: Internal Medicine

## 2015-11-12 VITALS — BP 122/70 | HR 71 | Temp 97.9°F | Wt 126.5 lb

## 2015-11-12 DIAGNOSIS — J441 Chronic obstructive pulmonary disease with (acute) exacerbation: Secondary | ICD-10-CM

## 2015-11-12 MED ORDER — METHYLPREDNISOLONE ACETATE 80 MG/ML IJ SUSP
80.0000 mg | Freq: Once | INTRAMUSCULAR | Status: AC
  Administered 2015-11-12: 80 mg via INTRAMUSCULAR

## 2015-11-12 MED ORDER — AZITHROMYCIN 250 MG PO TABS: ORAL_TABLET | ORAL | Status: DC

## 2015-11-12 NOTE — Progress Notes (Signed)
HPI  Pt presents to the clinic today with c/o cough and chest congestion. This started 3 days ago. The cough is productive of green mucous. She has had some runny nose but denies shortness of breath. She denies fever, chills or body aches. She has tried her Symbicort and Albuterol with some relief. She has not had sick contacts that she sis aware of. She does have COPD.  Review of Systems      Past Medical History  Diagnosis Date  . Diabetes mellitus     type 2  . Gastritis     via EGD  . Hiatal hernia     s/p dilation 2004  . Diverticulosis     s/p colonoscopy  . COPD (chronic obstructive pulmonary disease) (HCC)   . Stress incontinence, female   . Osteopenia     Family History  Problem Relation Age of Onset  . Stroke Mother     Social History   Social History  . Marital Status: Single    Spouse Name: N/A  . Number of Children: 2  . Years of Education: N/A   Occupational History  . [employer]      labtech   Social History Main Topics  . Smoking status: Former Smoker    Types: Cigarettes    Quit date: 12/29/1988  . Smokeless tobacco: Never Used     Comment: 20 or more years   . Alcohol Use: No  . Drug Use: No  . Sexual Activity: Not on file   Other Topics Concern  . Not on file   Social History Narrative   Marital Status: divorced   Children: 2children, 1 grandchild   Occupation: retired from [employer]   [employer] [place] [employer]    No Known Allergies   Constitutional:  Denies headache, fatigue, fever or abrupt weight changes.  HEENT:  Positive runny nose. Denies eye redness, eye pain, pressure behind the eyes, facial pain, nasal congestion, ear pain, ringing in the ears, wax buildup, or sore throat. Respiratory: Positive cough. Denies difficulty breathing or shortness of breath.  Cardiovascular: Denies chest pain, chest tightness, palpitations or swelling in the hands or feet.   No other specific complaints in a complete review of  systems (except as listed in HPI above).  Objective:   BP 122/70 mmHg  Pulse 71  Temp(Src) 97.9 F (36.6 C) (Oral)  Wt 126 lb 8 oz (57.38 kg)  SpO2 94% Wt Readings from Last 3 Encounters:  11/12/15 126 lb 8 oz (57.38 kg)  10/04/15 128 lb 8 oz (58.287 kg)  05/31/15 127 lb (57.607 kg)     General: Appears her stated age,  in NAD. HEENT: Head: normal bilateral cerumen impaction; Nose: mucosa pink and moist, septum midline; Throat/Mouth: Teeth present, mucosa pink and moist, no exudate noted, no lesions or ulcerations noted.  Neck: No cervical lymphadenopathy.  Cardiovascular: Normal rate and rhythm. S1,S2 noted.  No murmur, rubs or gallops noted.  Pulmonary/Chest: Normal effort and diminshed breath sounds with bilateral expiratory wheezing noted. No respiratory distress. No rales or ronchi noted.      Assessment & Plan:   COPD exacerbation:  Get some rest and drink plenty of water Continue Symbicort and Albuterol as prescribed eRx for Azithromax x 5 days Robitussin DM for cough 80 mg Depo IM today  RTC as needed or if symptoms persist.

## 2015-11-12 NOTE — Patient Instructions (Signed)
Chronic Obstructive Pulmonary Disease Chronic obstructive pulmonary disease (COPD) is a common lung condition in which airflow from the lungs is limited. COPD is a general term that can be used to describe many different lung problems that limit airflow, including both chronic bronchitis and emphysema. If you have COPD, your lung function will probably never return to normal, but there are measures you can take to improve lung function and make yourself feel better. CAUSES   Smoking (common).  Exposure to secondhand smoke.  Genetic problems.  Chronic inflammatory lung diseases or recurrent infections. SYMPTOMS  Shortness of breath, especially with physical activity.  Deep, persistent (chronic) cough with a large amount of thick mucus.  Wheezing.  Rapid breaths (tachypnea).  Gray or bluish discoloration (cyanosis) of the skin, especially in your fingers, toes, or lips.  Fatigue.  Weight loss.  Frequent infections or episodes when breathing symptoms become much worse (exacerbations).  Chest tightness. DIAGNOSIS Your health care provider will take a medical history and perform a physical examination to diagnose COPD. Additional tests for COPD may include:  Lung (pulmonary) function tests.  Chest X-ray.  CT scan.  Blood tests. TREATMENT  Treatment for COPD may include:  Inhaler and nebulizer medicines. These help manage the symptoms of COPD and make your breathing more comfortable.  Supplemental oxygen. Supplemental oxygen is only helpful if you have a low oxygen level in your blood.  Exercise and physical activity. These are beneficial for nearly all people with COPD.  Lung surgery or transplant.  Nutrition therapy to gain weight, if you are underweight.  Pulmonary rehabilitation. This may involve working with a team of health care providers and specialists, such as respiratory, occupational, and physical therapists. HOME CARE INSTRUCTIONS  Take all medicines  (inhaled or pills) as directed by your health care provider.  Avoid over-the-counter medicines or cough syrups that dry up your airway (such as antihistamines) and slow down the elimination of secretions unless instructed otherwise by your health care provider.  If you are a smoker, the most important thing that you can do is stop smoking. Continuing to smoke will cause further lung damage and breathing trouble. Ask your health care provider for help with quitting smoking. He or she can direct you to community resources or hospitals that provide support.  Avoid exposure to irritants such as smoke, chemicals, and fumes that aggravate your breathing.  Use oxygen therapy and pulmonary rehabilitation if directed by your health care provider. If you require home oxygen therapy, ask your health care provider whether you should purchase a pulse oximeter to measure your oxygen level at home.  Avoid contact with individuals who have a contagious illness.  Avoid extreme temperature and humidity changes.  Eat healthy foods. Eating smaller, more frequent meals and resting before meals may help you maintain your strength.  Stay active, but balance activity with periods of rest. Exercise and physical activity will help you maintain your ability to do things you want to do.  Preventing infection and hospitalization is very important when you have COPD. Make sure to receive all the vaccines your health care provider recommends, especially the pneumococcal and influenza vaccines. Ask your health care provider whether you need a pneumonia vaccine.  Learn and use relaxation techniques to manage stress.  Learn and use controlled breathing techniques as directed by your health care provider. Controlled breathing techniques include:  Pursed lip breathing. Start by breathing in (inhaling) through your nose for 1 second. Then, purse your lips as if you were   going to whistle and breathe out (exhale) through the  pursed lips for 2 seconds.  Diaphragmatic breathing. Start by putting one hand on your abdomen just above your waist. Inhale slowly through your nose. The hand on your abdomen should move out. Then purse your lips and exhale slowly. You should be able to feel the hand on your abdomen moving in as you exhale.  Learn and use controlled coughing to clear mucus from your lungs. Controlled coughing is a series of short, progressive coughs. The steps of controlled coughing are: 1. Lean your head slightly forward. 2. Breathe in deeply using diaphragmatic breathing. 3. Try to hold your breath for 3 seconds. 4. Keep your mouth slightly open while coughing twice. 5. Spit any mucus out into a tissue. 6. Rest and repeat the steps once or twice as needed. SEEK MEDICAL CARE IF:  You are coughing up more mucus than usual.  There is a change in the color or thickness of your mucus.  Your breathing is more labored than usual.  Your breathing is faster than usual. SEEK IMMEDIATE MEDICAL CARE IF:  You have shortness of breath while you are resting.  You have shortness of breath that prevents you from:  Being able to talk.  Performing your usual physical activities.  You have chest pain lasting longer than 5 minutes.  Your skin color is more cyanotic than usual.  You measure low oxygen saturations for longer than 5 minutes with a pulse oximeter. MAKE SURE YOU:  Understand these instructions.  Will watch your condition.  Will get help right away if you are not doing well or get worse.   This information is not intended to replace advice given to you by your health care provider. Make sure you discuss any questions you have with your health care provider.   Document Released: 09/24/2005 Document Revised: 01/05/2015 Document Reviewed: 08/11/2013 Elsevier Interactive Patient Education 2016 Elsevier Inc.  

## 2015-11-12 NOTE — Progress Notes (Signed)
Pre visit review using our clinic review tool, if applicable. No additional management support is needed unless otherwise documented below in the visit note. 

## 2015-12-06 ENCOUNTER — Telehealth: Payer: Self-pay | Admitting: *Deleted

## 2015-12-06 NOTE — Telephone Encounter (Signed)
Form done, thanks ?

## 2015-12-06 NOTE — Telephone Encounter (Signed)
Faxed form for Physician Order for Home Blood Glucose Testing.  Form in Dr. Ashok Cordia's In Box.

## 2015-12-07 NOTE — Telephone Encounter (Signed)
Form faxed and sent for scanning.

## 2016-04-30 ENCOUNTER — Ambulatory Visit (INDEPENDENT_AMBULATORY_CARE_PROVIDER_SITE_OTHER): Payer: Commercial Managed Care - HMO | Admitting: Family Medicine

## 2016-04-30 ENCOUNTER — Encounter: Payer: Self-pay | Admitting: Family Medicine

## 2016-04-30 VITALS — BP 104/64 | HR 80 | Temp 97.9°F | Ht 63.0 in | Wt 132.8 lb

## 2016-04-30 DIAGNOSIS — J0100 Acute maxillary sinusitis, unspecified: Secondary | ICD-10-CM | POA: Insufficient documentation

## 2016-04-30 DIAGNOSIS — M1990 Unspecified osteoarthritis, unspecified site: Secondary | ICD-10-CM

## 2016-04-30 MED ORDER — AMOXICILLIN-POT CLAVULANATE 875-125 MG PO TABS: 1.0000 | ORAL_TABLET | Freq: Two times a day (BID) | ORAL | Status: DC

## 2016-04-30 MED ORDER — TRAMADOL HCL 50 MG PO TABS: ORAL_TABLET | ORAL | Status: DC

## 2016-04-30 NOTE — Assessment & Plan Note (Signed)
Augmentin, rest, fluids, f/u prn.  Nontoxic.  D/w pt.  Ctab.  Ear wax removed w/o complication and she was grateful.

## 2016-04-30 NOTE — Progress Notes (Signed)
Pre visit review using our clinic review tool, if applicable. No additional management support is needed unless otherwise documented below in the visit note.  Cough.  Some yellow sputum.  ST.   L ear feels plugged.  At baseline she had needed albuterol episodically, more in the last 2 weeks.  Higher pollen counts recently.  Not SOB, speaking in complete sentences.  She uses a mask when mowing.  No fevers.  No vomiting, no diarrhea.  Some wheeze, episodically, more in the AM, tends to wax and wane.  Sleep disrupted in general, with post nasal gtt and congestion affecting her sleep.  Voice has been altered.     She needed refill on tramadol.  No ADE.  It helps her joint aches.  She hasn't had a shot in her knees in a prolonged time.  She had seen Dr. Jorge MandrilHiltz but he has moved.    Meds, vitals, and allergies reviewed.   ROS: Per HPI unless specifically indicated in ROS section   GEN: nad, alert and oriented HEENT: mucous membranes moist, tm with B cerumen removed with irrigation w/o complication and recheck w/o erythema, nasal exam w/o erythema, clear discharge noted,  OP with cobblestoning, L max sinus ttp NECK: supple w/o LA CV: rrr.   PULM: ctab, no inc wob EXT: no edema

## 2016-04-30 NOTE — Patient Instructions (Addendum)
Call here for an appointment if you need to see Dr. Patsy Lageropland about your knees.   Start augmentin and drink plenty of fluids.  Take care.  Glad to see you.

## 2016-05-06 DIAGNOSIS — H524 Presbyopia: Secondary | ICD-10-CM | POA: Diagnosis not present

## 2016-05-06 DIAGNOSIS — E113393 Type 2 diabetes mellitus with moderate nonproliferative diabetic retinopathy without macular edema, bilateral: Secondary | ICD-10-CM | POA: Diagnosis not present

## 2016-05-06 DIAGNOSIS — H5210 Myopia, unspecified eye: Secondary | ICD-10-CM | POA: Diagnosis not present

## 2016-05-12 DIAGNOSIS — Z0100 Encounter for examination of eyes and vision without abnormal findings: Secondary | ICD-10-CM | POA: Diagnosis not present

## 2016-10-05 ENCOUNTER — Other Ambulatory Visit: Payer: Self-pay | Admitting: Family Medicine

## 2016-10-05 DIAGNOSIS — R739 Hyperglycemia, unspecified: Secondary | ICD-10-CM

## 2016-10-05 DIAGNOSIS — M8580 Other specified disorders of bone density and structure, unspecified site: Secondary | ICD-10-CM

## 2016-10-09 ENCOUNTER — Other Ambulatory Visit (INDEPENDENT_AMBULATORY_CARE_PROVIDER_SITE_OTHER): Payer: Commercial Managed Care - HMO

## 2016-10-09 ENCOUNTER — Ambulatory Visit (INDEPENDENT_AMBULATORY_CARE_PROVIDER_SITE_OTHER): Payer: Commercial Managed Care - HMO

## 2016-10-09 VITALS — BP 98/70 | HR 69 | Temp 98.3°F | Ht 62.5 in | Wt 128.2 lb

## 2016-10-09 DIAGNOSIS — M8580 Other specified disorders of bone density and structure, unspecified site: Secondary | ICD-10-CM | POA: Diagnosis not present

## 2016-10-09 DIAGNOSIS — R739 Hyperglycemia, unspecified: Secondary | ICD-10-CM | POA: Diagnosis not present

## 2016-10-09 DIAGNOSIS — Z23 Encounter for immunization: Secondary | ICD-10-CM

## 2016-10-09 DIAGNOSIS — Z0000 Encounter for general adult medical examination without abnormal findings: Secondary | ICD-10-CM

## 2016-10-09 LAB — LIPID PANEL
CHOLESTEROL: 181 mg/dL (ref 0–200)
HDL: 55.6 mg/dL (ref >39.00)
LDL Cholesterol: 106 mg/dL — ABNORMAL HIGH (ref 0–99)
NonHDL: 125.09
TRIGLYCERIDES: 94 mg/dL (ref 0.0–149.0)
Total CHOL/HDL Ratio: 3
VLDL: 18.8 mg/dL (ref 0.0–40.0)

## 2016-10-09 LAB — COMPREHENSIVE METABOLIC PANEL
ALT: 11 U/L (ref 0–35)
AST: 22 U/L (ref 0–37)
Albumin: 3.8 g/dL (ref 3.5–5.2)
Alkaline Phosphatase: 52 U/L (ref 39–117)
BUN: 23 mg/dL (ref 6–23)
CHLORIDE: 103 meq/L (ref 96–112)
CO2: 29 meq/L (ref 19–32)
CREATININE: 1.33 mg/dL — AB (ref 0.40–1.20)
Calcium: 9.4 mg/dL (ref 8.4–10.5)
GFR: 41.21 mL/min — ABNORMAL LOW (ref >60.00)
GLUCOSE: 88 mg/dL (ref 70–99)
Potassium: 5.1 mEq/L (ref 3.5–5.1)
SODIUM: 137 meq/L (ref 135–145)
Total Bilirubin: 0.4 mg/dL (ref 0.2–1.2)
Total Protein: 6.8 g/dL (ref 6.0–8.3)

## 2016-10-09 LAB — HEMOGLOBIN A1C: HEMOGLOBIN A1C: 5.9 % (ref 4.6–6.5)

## 2016-10-09 LAB — VITAMIN D 25 HYDROXY (VIT D DEFICIENCY, FRACTURES): VITD: 55.68 ng/mL (ref 30.00–100.00)

## 2016-10-09 NOTE — Patient Instructions (Signed)
[name] , Thank you for taking time to come for your Medicare Wellness Visit. I appreciate your ongoing commitment to your health goals. Please review the following plan we discussed and let me know if I can assist you in the future.   These are the goals we discussed: Goals    . Increase water intake          Starting 10/09/2016, I will drink at least 6 glasses of water daily.        This is a list of the screening recommended for you and due dates:  Health Maintenance  Topic Date Due  . DEXA scan (bone density measurement)  10/09/2017*  . Shingles Vaccine  10/09/2017*  . Tetanus Vaccine  12/29/2017  . Flu Shot  Addressed  . Pneumonia vaccines  Completed  *Topic was postponed. The date shown is not the original due date.   Preventive Care for Adults  A healthy lifestyle and preventive care can promote health and wellness. Preventive health guidelines for adults include the following key practices.  . A routine yearly physical is a good way to check with your health care provider about your health and preventive screening. It is a chance to share any concerns and updates on your health and to receive a thorough exam.  . Visit your dentist for a routine exam and preventive care every 6 months. Brush your teeth twice a day and floss once a day. Good oral hygiene prevents tooth decay and gum disease.  . The frequency of eye exams is based on your age, health, family medical history, use  of contact lenses, and other factors. Follow your health care provider's ecommendations for frequency of eye exams.  . Eat a healthy diet. Foods like vegetables, fruits, whole grains, low-fat dairy products, and lean protein foods contain the nutrients you need without too many calories. Decrease your intake of foods high in solid fats, added sugars, and salt. Eat the right amount of calories for you. Get information about a proper diet from your health care provider, if necessary.  . Regular  physical exercise is one of the most important things you can do for your health. Most adults should get at least 150 minutes of moderate-intensity exercise (any activity that increases your heart rate and causes you to sweat) each week. In addition, most adults need muscle-strengthening exercises on 2 or more days a week.  Silver Sneakers may be a benefit available to you. To determine eligibility, you may visit the website: www.silversneakers.com or contact program at [phone] Mon-Fri between 8AM-8PM.   . Maintain a healthy weight. The body mass index (BMI) is a screening tool to identify possible weight problems. It provides an estimate of body fat based on height and weight. Your health care provider can find your BMI and can help you achieve or maintain a healthy weight.   For adults 20 years and older: ? A BMI below 18.5 is considered underweight. ? A BMI of 18.5 to 24.9 is normal. ? A BMI of 25 to 29.9 is considered overweight. ? A BMI of 30 and above is considered obese.   . Maintain normal blood lipids and cholesterol levels by exercising and minimizing your intake of saturated fat. Eat a balanced diet with plenty of fruit and vegetables. Blood tests for lipids and cholesterol should begin at age 620 and be repeated every 5 years. If your lipid or cholesterol levels are high, you are over 50, or you are at high risk  for heart disease, you may need your cholesterol levels checked more frequently. Ongoing high lipid and cholesterol levels should be treated with medicines if diet and exercise are not working.  . If you smoke, find out from your health care provider how to quit. If you do not use tobacco, please do not start.  . If you choose to drink alcohol, please do not consume more than 2 drinks per day. One drink is considered to be 12 ounces (355 mL) of beer, 5 ounces (148 mL) of wine, or 1.5 ounces (44 mL) of liquor.  . If you are 80-7 years old, ask your health care provider if  you should take aspirin to prevent strokes.  . Use sunscreen. Apply sunscreen liberally and repeatedly throughout the day. You should seek shade when your shadow is shorter than you. Protect yourself by wearing long sleeves, pants, a wide-brimmed hat, and sunglasses year round, whenever you are outdoors.  . Once a month, do a whole body skin exam, using a mirror to look at the skin on your back. Tell your health care provider of new moles, moles that have irregular borders, moles that are larger than a pencil eraser, or moles that have changed in shape or color.

## 2016-10-09 NOTE — Progress Notes (Signed)
PCP notes:   Health maintenance:  Bone density - pt postponed Shingles - pt postponed Flu vaccine - administered  Abnormal screenings:   Hearing - failed  Patient concerns:   None  Nurse concerns:  None  Next PCP appt:   10/16/16 @ 1415

## 2016-10-09 NOTE — Progress Notes (Signed)
Pre visit review using our clinic review tool, if applicable. No additional management support is needed unless otherwise documented below in the visit note. 

## 2016-10-09 NOTE — Progress Notes (Signed)
Subjective:   Dominique Clark is a 75 y.o. female who presents for Medicare Annual (Subsequent) preventive examination.  Review of Systems:  N/A Cardiac Risk Factors include: advanced age (>3655men, 69>65 women)     Objective:     Vitals: BP 98/70 (BP Location: Left Arm, Patient Position: Sitting, Cuff Size: Normal)   Pulse 69   Temp 98.3 F (36.8 C) (Oral)   Ht 5' 2.5" (1.588 m) Comment: no shoes  Wt 128 lb 4 oz (58.2 kg)   SpO2 94%   BMI 23.08 kg/m   Body mass index is 23.08 kg/m.   Tobacco History  Smoking Status  . Former Smoker  . Types: Cigarettes  . Quit date: 12/29/1988  Smokeless Tobacco  . Never Used    Comment: 20 or more years      Counseling given: Not Answered   Past Medical History:  Diagnosis Date  . COPD (chronic obstructive pulmonary disease) (HCC)   . Diabetes mellitus    type 2  . Diverticulosis    s/p colonoscopy  . Gastritis    via EGD  . Hiatal hernia    s/p dilation 2004  . Osteopenia   . Stress incontinence, female    Past Surgical History:  Procedure Laterality Date  . BLADDER REPAIR  9/04   bladder tack   . BREAST CYST EXCISION Right 1972  . TONSILLECTOMY     age 75  . TOTAL ABDOMINAL HYSTERECTOMY     Family History  Problem Relation Age of Onset  . Stroke Mother    History  Sexual Activity  . Sexual activity: No    Outpatient Encounter Prescriptions as of 10/09/2016  Medication Sig  . albuterol (PROVENTIL HFA;VENTOLIN HFA) 108 (90 BASE) MCG/ACT inhaler Inhale 1-2 puffs into the lungs every 4 (four) hours as needed for wheezing. Okay to fill with ventolin or proventil if needed.  . budesonide-formoterol (SYMBICORT) 160-4.5 MCG/ACT inhaler INHALE 2 PUFFS EVERY 12 HOURS TO PREVENTCOUGH OR WHEEZING--RINSE, GARGLE, & SPITAFTER EACH USE.  . Esomeprazole Magnesium (NEXIUM PO) Take by mouth daily.  . fluticasone (FLONASE) 50 MCG/ACT nasal spray INHALE 2 SPRAYS INTO EACH NOSTRIL EVERY DAY.  . meloxicam (MOBIC) 15 MG tablet Take  1 tablet (15 mg total) by mouth daily.  . metFORMIN (GLUCOPHAGE) 500 MG tablet Take 1 tablet (500 mg total) by mouth daily with breakfast.  . metoprolol succinate (TOPROL-XL) 25 MG 24 hr tablet Take 1 tablet (25 mg total) by mouth daily.  . traMADol (ULTRAM) 50 MG tablet TAKE 1 TO 2 TABLETS BY MOUTH EVERY 8 HOURS AS NEEDED FOR PAIN.  . [DISCONTINUED] amoxicillin-clavulanate (AUGMENTIN) 875-125 MG tablet Take 1 tablet by mouth 2 (two) times daily.   No facility-administered encounter medications on file as of 10/09/2016.     Activities of Daily Living In your present state of health, do you have any difficulty performing the following activities: 10/09/2016  Hearing? Y  Vision? N  Difficulty concentrating or making decisions? N  Walking or climbing stairs? N  Dressing or bathing? N  Doing errands, shopping? N  Preparing Food and eating ? N  Using the Toilet? N  In the past six months, have you accidently leaked urine? N  Do you have problems with loss of bowel control? N  Managing your Medications? N  Managing your Finances? N  Housekeeping or managing your Housekeeping? N  Some recent data might be hidden    Patient Care Team: Joaquim NamGraham S Duncan, MD as PCP -  General (Family Medicine) Vedia Pereyra. Alvester Morin, MD as Consulting Physician (Ophthalmology)    Assessment:     Hearing Screening   125Hz  250Hz  500Hz  1000Hz  2000Hz  3000Hz  4000Hz  6000Hz  8000Hz   Right ear:   40 40 40  40    Left ear:   40 40 40  0    Vision Screening Comments: Last vision exam in June 2017 with Dr. Alvester Morin   Exercise Activities and Dietary recommendations Current Exercise Habits: The patient does not participate in regular exercise at present, Exercise limited by: respiratory conditions(s)  Goals    . Increase water intake          Starting 10/09/2016, I will drink at least 6 glasses of water daily.       Fall Risk Fall Risk  10/09/2016 10/04/2015  Falls in the past year? No No   Depression Screen PHQ 2/9  Scores 10/09/2016 10/04/2015  PHQ - 2 Score 0 0     Cognitive Testing MMSE - Mini Mental State Exam 10/09/2016  Orientation to time 5  Orientation to Place 5  Registration 3  Attention/ Calculation 0  Recall 3  Language- name 2 objects 0  Language- repeat 1  Language- follow 3 step command 3  Language- read & follow direction 0  Write a sentence 0  Copy design 0  Total score 20   PLEASE NOTE: A Mini-Cog screen was completed. Maximum score is 20. A value of 0 denotes this part of Folstein MMSE was not completed or the patient failed this part of the Mini-Cog screening.   Mini-Cog Screening Orientation to Time - Max 5 pts Orientation to Place - Max 5 pts Registration - Max 3 pts Recall - Max 3 pts Language Repeat - Max 1 pts Language Follow 3 Step Command - Max 3 pts  Immunization History  Administered Date(s) Administered  . Influenza Whole 10/25/2013  . Influenza,inj,Quad PF,36+ Mos 10/04/2015, 10/09/2016  . Influenza-Unspecified 10/30/2014  . Pneumococcal Conjugate-13 10/04/2015  . Pneumococcal Polysaccharide-23 09/16/2007, 08/10/2012  . Td 08/21/1999   Screening Tests Health Maintenance  Topic Date Due  . DEXA SCAN  10/09/2017 (Originally 08/02/2005)  . ZOSTAVAX  10/09/2017 (Originally 08/02/2000)  . TETANUS/TDAP  12/29/2017  . INFLUENZA VACCINE  Addressed  . PNA vac Low Risk Adult  Completed      Plan:     I have personally reviewed and addressed the Medicare Annual Wellness questionnaire and have noted the following in the patient's chart:  A. Medical and social history B. Use of alcohol, tobacco or illicit drugs  C. Current medications and supplements D. Functional ability and status E.  Nutritional status F.  Physical activity G. Advance directives H. List of other physicians I.  Hospitalizations, surgeries, and ER visits in previous 12 months J.  Vitals K. Screenings to include hearing, vision, cognitive, depression L. Referrals and appointments -  none  In addition, I have reviewed and discussed with patient certain preventive protocols, quality metrics, and best practice recommendations. A written personalized care plan for preventive services as well as general preventive health recommendations were provided to patient.  See attached scanned questionnaire for additional information.   Signed,   Randa Evens, MHA, BS, LPN Health Coach

## 2016-10-12 NOTE — Progress Notes (Signed)
I reviewed health advisor's note, was available for consultation on the day of service listed in this note, and agree with documentation and plan. Graham Duncan, MD.   

## 2016-10-16 ENCOUNTER — Ambulatory Visit (INDEPENDENT_AMBULATORY_CARE_PROVIDER_SITE_OTHER): Payer: Commercial Managed Care - HMO | Admitting: Family Medicine

## 2016-10-16 ENCOUNTER — Encounter: Payer: Self-pay | Admitting: Family Medicine

## 2016-10-16 VITALS — BP 128/80 | HR 72 | Temp 97.8°F | Ht 63.0 in | Wt 132.5 lb

## 2016-10-16 DIAGNOSIS — J449 Chronic obstructive pulmonary disease, unspecified: Secondary | ICD-10-CM | POA: Diagnosis not present

## 2016-10-16 DIAGNOSIS — Z0000 Encounter for general adult medical examination without abnormal findings: Secondary | ICD-10-CM | POA: Insufficient documentation

## 2016-10-16 DIAGNOSIS — M25561 Pain in right knee: Secondary | ICD-10-CM

## 2016-10-16 DIAGNOSIS — M8580 Other specified disorders of bone density and structure, unspecified site: Secondary | ICD-10-CM

## 2016-10-16 DIAGNOSIS — I10 Essential (primary) hypertension: Secondary | ICD-10-CM | POA: Insufficient documentation

## 2016-10-16 DIAGNOSIS — R739 Hyperglycemia, unspecified: Secondary | ICD-10-CM

## 2016-10-16 DIAGNOSIS — M25562 Pain in left knee: Secondary | ICD-10-CM

## 2016-10-16 DIAGNOSIS — Z7189 Other specified counseling: Secondary | ICD-10-CM

## 2016-10-16 DIAGNOSIS — G8929 Other chronic pain: Secondary | ICD-10-CM

## 2016-10-16 DIAGNOSIS — R7989 Other specified abnormal findings of blood chemistry: Secondary | ICD-10-CM | POA: Diagnosis not present

## 2016-10-16 DIAGNOSIS — M1990 Unspecified osteoarthritis, unspecified site: Secondary | ICD-10-CM

## 2016-10-16 LAB — BASIC METABOLIC PANEL
BUN: 22 mg/dL (ref 6–23)
CHLORIDE: 104 meq/L (ref 96–112)
CO2: 30 meq/L (ref 19–32)
Calcium: 9.4 mg/dL (ref 8.4–10.5)
Creatinine, Ser: 1.25 mg/dL — ABNORMAL HIGH (ref 0.40–1.20)
GFR: 44.26 mL/min — ABNORMAL LOW (ref >60.00)
Glucose, Bld: 98 mg/dL (ref 70–99)
POTASSIUM: 5.5 meq/L — AB (ref 3.5–5.1)
SODIUM: 139 meq/L (ref 135–145)

## 2016-10-16 MED ORDER — TRAMADOL HCL 50 MG PO TABS: ORAL_TABLET | ORAL | 1 refills | Status: DC

## 2016-10-16 MED ORDER — FLUTICASONE PROPIONATE 50 MCG/ACT NA SUSP: NASAL | 3 refills | Status: DC

## 2016-10-16 MED ORDER — TIOTROPIUM BROMIDE MONOHYDRATE 18 MCG IN CAPS: 18.0000 ug | ORAL_CAPSULE | Freq: Every day | RESPIRATORY_TRACT | 3 refills | Status: DC

## 2016-10-16 MED ORDER — ALBUTEROL SULFATE HFA 108 (90 BASE) MCG/ACT IN AERS: 1.0000 | INHALATION_SPRAY | RESPIRATORY_TRACT | 3 refills | Status: DC | PRN

## 2016-10-16 MED ORDER — METOPROLOL SUCCINATE ER 25 MG PO TB24: 25.0000 mg | ORAL_TABLET | Freq: Every day | ORAL | 3 refills | Status: DC

## 2016-10-16 MED ORDER — BUDESONIDE-FORMOTEROL FUMARATE 160-4.5 MCG/ACT IN AERO: INHALATION_SPRAY | RESPIRATORY_TRACT | 3 refills | Status: DC

## 2016-10-16 MED ORDER — MELOXICAM 15 MG PO TABS: 15.0000 mg | ORAL_TABLET | Freq: Every day | ORAL | 3 refills | Status: DC

## 2016-10-16 NOTE — Progress Notes (Signed)
H/o Dm2/hyperglycemia.  Labs d/w pt. Not DM2 by A1c, assuming she doesn't have sig elevation with stoppage of med.  D/w pt.  See plan.   COPD.  Still with some cough and sputum, some wheeze.  Some relief with inhalers, but with less effect from symbicort, 2 puffs BID.  Using SABA every day, needing it every day.  Compliant with meds.    She'll check on zostavax coverage. Mammogram d/w pt.  Encouraged follow up.  She declined for now.   DXA declined.   Advance directive- daughter Dominique Clark and son Dominique Clark equally designated if patient were incapacitated.    Sig knee pain and wanted to get set up with Dr. Patsy Lageropland re: knee injections.  Taking pain meds at baseline w/o ADE.    PMH and SH reviewed  Meds, vitals, and allergies reviewed.   ROS: Per HPI unless specifically indicated in ROS section   GEN: nad, alert and oriented HEENT: mucous membranes moist NECK: supple w/o LA CV: rrr. PULM: no inc wob, scant wheeze note B, no focal dec in BS ABD: soft, +bs EXT: no edema SKIN: no acute rash

## 2016-10-16 NOTE — Assessment & Plan Note (Signed)
Advance directive- daughter [name] and son [name] equally designated if patient were incapacitated.

## 2016-10-16 NOTE — Assessment & Plan Note (Signed)
Add on spiriva and she'll update me.  She doesn't appear to be in a flare and is still okay for outpatient f/u.  She agrees with plan. >25 minutes spent in face to face time with patient, >50% spent in counselling or coordination of care.

## 2016-10-16 NOTE — Assessment & Plan Note (Signed)
Recheck BP okay, would continue as is with metoprolol.

## 2016-10-16 NOTE — Patient Instructions (Addendum)
Go to the lab on the way out.  We'll contact you with your lab report.  Recheck A1c in about 3 months.  Stop metformin in the meantime.   Ask about seeing Dr. Patsy Lageropland for your knees.   Continue symbicort and use albuterol as needed.  Add on spiriva in the meantime.  Update me if your breathing isn't getting better.  Take care.  Glad to see you.

## 2016-10-16 NOTE — Assessment & Plan Note (Signed)
She'll ask about f/u with Dr. Patsy Lageropland for consideration of injection.  Tramadol does help her pain, no ADE on med.  Continue as is for now.  See notes on labs.

## 2016-10-16 NOTE — Assessment & Plan Note (Signed)
Declined DXA, d/w pt.

## 2016-10-16 NOTE — Assessment & Plan Note (Signed)
She'll check on zostavax coverage. Mammogram d/w pt.  Encouraged follow up.  She declined for now.   DXA declined.  D/w pt.  Advance directive- daughter Dominique Clark and son Dominique Clark equally designated if patient were incapacitated.

## 2016-10-16 NOTE — Assessment & Plan Note (Signed)
Likely not DM2 by A1c, stop A1c and recheck A1c in about 3 months.  She agrees.

## 2016-10-16 NOTE — Assessment & Plan Note (Signed)
Recheck bmet today.  I didn't change her meds other than stopping metformin and adding spiriva, as both of those changes would have happened anyway, even if her Cr were normal.  See notes on labs.

## 2016-10-16 NOTE — Progress Notes (Signed)
Pre visit review using our clinic review tool, if applicable. No additional management support is needed unless otherwise documented below in the visit note. 

## 2016-10-17 ENCOUNTER — Other Ambulatory Visit: Payer: Self-pay | Admitting: Family Medicine

## 2016-10-17 DIAGNOSIS — R7989 Other specified abnormal findings of blood chemistry: Secondary | ICD-10-CM

## 2016-10-22 ENCOUNTER — Ambulatory Visit (INDEPENDENT_AMBULATORY_CARE_PROVIDER_SITE_OTHER): Payer: Commercial Managed Care - HMO | Admitting: Family Medicine

## 2016-10-22 ENCOUNTER — Encounter: Payer: Self-pay | Admitting: Family Medicine

## 2016-10-22 ENCOUNTER — Other Ambulatory Visit (INDEPENDENT_AMBULATORY_CARE_PROVIDER_SITE_OTHER): Payer: Commercial Managed Care - HMO

## 2016-10-22 VITALS — BP 141/63 | HR 73 | Temp 97.6°F | Ht 62.5 in | Wt 130.2 lb

## 2016-10-22 DIAGNOSIS — M170 Bilateral primary osteoarthritis of knee: Secondary | ICD-10-CM

## 2016-10-22 DIAGNOSIS — M25561 Pain in right knee: Secondary | ICD-10-CM | POA: Diagnosis not present

## 2016-10-22 DIAGNOSIS — R7989 Other specified abnormal findings of blood chemistry: Secondary | ICD-10-CM | POA: Diagnosis not present

## 2016-10-22 DIAGNOSIS — G8929 Other chronic pain: Secondary | ICD-10-CM

## 2016-10-22 DIAGNOSIS — M25562 Pain in left knee: Secondary | ICD-10-CM

## 2016-10-22 LAB — BASIC METABOLIC PANEL
BUN: 20 mg/dL (ref 6–23)
CHLORIDE: 102 meq/L (ref 96–112)
CO2: 29 meq/L (ref 19–32)
Calcium: 9.5 mg/dL (ref 8.4–10.5)
Creatinine, Ser: 1.29 mg/dL — ABNORMAL HIGH (ref 0.40–1.20)
GFR: 42.68 mL/min — ABNORMAL LOW (ref >60.00)
Glucose, Bld: 88 mg/dL (ref 70–99)
POTASSIUM: 5.1 meq/L (ref 3.5–5.1)
SODIUM: 138 meq/L (ref 135–145)

## 2016-10-22 MED ORDER — METHYLPREDNISOLONE ACETATE 40 MG/ML IJ SUSP
80.0000 mg | Freq: Once | INTRAMUSCULAR | Status: AC
  Administered 2016-10-22: 80 mg via INTRA_ARTICULAR

## 2016-10-22 NOTE — Progress Notes (Signed)
Pre visit review using our clinic review tool, if applicable. No additional management support is needed unless otherwise documented below in the visit note. 

## 2016-10-22 NOTE — Progress Notes (Signed)
Dr. Karleen Hampshire T. Zolton Dowson, MD, CAQ Sports Medicine Primary Care and Sports Medicine [address] [place], [address] Phone: [phone] Fax: [phone]  10/22/2016  Patient: Dominique Clark, MRN: [id number], DOB: 30-May-1941, 75 y.o.  Primary Physician:  Crawford Givens, MD   Chief Complaint  Patient presents with  . Knee Pain    Bilateral-Wants injected   Subjective:   Dominique Clark is a 75 y.o. very pleasant female patient who presents with the following:  Used to see Glean Hess.  Usually gets one every year, sometimes every 2 years at [facility].  Has had surgery on both of them in the past.  She has known osteoarthritis in both knees, and she is a good response to corticosteroid injection in the past.  Past Medical History, Surgical History, Social History, Family History, Problem List, Medications, and Allergies have been reviewed and updated if relevant.  Patient Active Problem List   Diagnosis Date Noted  . Elevated serum creatinine 10/16/2016  . Healthcare maintenance 10/16/2016  . Essential hypertension 10/16/2016  . Medicare annual wellness visit, initial 10/08/2015  . Advance care planning 10/08/2015  . Knee pain 02/23/2015  . Stricture and stenosis of esophagus 02/20/2014  . Special screening for malignant neoplasms, colon 01/30/2014  . COPD with acute exacerbation (HCC) 08/09/2012  . Hiatal hernia 04/20/2012  . Dysphagia 03/09/2012  . Vertigo, benign paroxysmal 05/02/2011  . Anemia 04/17/2011  . Hyperglycemia 02/14/2011  . INCONTINENCE, FEMALE STRESS 10/11/2008  . GASTRITIS 05/30/2008  . ESOPHAGEAL REFLUX 02/24/2008  . Osteopenia 09/29/2007  . NECK AND BACK PAIN 07/26/2007  . COPD (chronic obstructive pulmonary disease) (HCC) 03/29/2007  . DIVERTICULOSIS, COLON W/O HEM 03/29/2007    Past Medical History:  Diagnosis Date  . COPD (chronic obstructive pulmonary disease) (HCC)   . Diabetes mellitus    type 2  . Diverticulosis    s/p  colonoscopy  . Gastritis    via EGD  . Hiatal hernia    s/p dilation 2004  . Osteopenia   . Stress incontinence, female     Past Surgical History:  Procedure Laterality Date  . BLADDER REPAIR  9/04   bladder tack   . BREAST CYST EXCISION Right 1972  . TONSILLECTOMY     age 75  . TOTAL ABDOMINAL HYSTERECTOMY      Social History   Social History  . Marital status: Single    Spouse name: N/A  . Number of children: 2  . Years of education: N/A   Occupational History  . [employer]  Retired    Quarry manager   Social History Main Topics  . Smoking status: Former Smoker    Types: Cigarettes    Quit date: 12/29/1988  . Smokeless tobacco: Never Used     Comment: 20 or more years   . Alcohol use No  . Drug use: No  . Sexual activity: No   Other Topics Concern  . Not on file   Social History Narrative   Marital Status: divorced   Children: 2 children, 1 grandchild   Occupation: retired from [employer] and [employer] fan    Family History  Problem Relation Age of Onset  . Stroke Mother   . Breast cancer Neg Hx   . Colon cancer Neg Hx     No Known Allergies  Medication list reviewed and updated in full in [facility].  GEN: No fevers, chills. Nontoxic. Primarily MSK c/o today. MSK:  Detailed in the HPI GI: tolerating PO intake without difficulty Neuro: No numbness, parasthesias, or tingling associated. Otherwise the pertinent positives of the ROS are noted above.   Objective:   BP (!) 141/63   Pulse 73   Temp 97.6 F (36.4 C) (Oral)   Ht 5' 2.5" (1.588 m)   Wt 130 lb 4 oz (59.1 kg)   BMI 23.44 kg/m    GEN: WDWN, NAD, Non-toxic, Alert & Oriented x 3 HEENT: Atraumatic, Normocephalic.  Ears and Nose: No external deformity. EXTR: No clubbing/cyanosis/edema NEURO: Normal gait.  PSYCH: Normally interactive. Conversant. Not depressed or anxious appearing.  Calm demeanor.    Patient lacks 2 of extension and she is able to flex to 110  on both knee. She has medial and lateral joint line tenderness. Minimal motion is possible at the knee. No significant tenderness at the patellar tendon or quad tendon. MCL, LCL, anterior cruciate ligament, and PCL are intact. There is pain with flexion pinch. Some pain with McMurray's.  Radiology: No results found.  Assessment and Plan:   Primary osteoarthritis of both knees  Chronic pain of right knee - Plan: methylPREDNISolone acetate (DEPO-MEDROL) injection 80 mg  Chronic pain of left knee - Plan: methylPREDNISolone acetate (DEPO-MEDROL) injection 80 mg  She has done well for a long time with rare steroid injection, and I think that is reasonable at age 75. Very pleasant patient.  Knee Injection, RIGHT Patient verbally consented to procedure. Risks (including potential rare risk of infection), benefits, and alternatives explained. Sterilely prepped with Chloraprep. Ethyl cholride used for anesthesia. 8 cc Lidocaine 1% mixed with 2 mL Depo-Medrol 40 mg injected using the anteromedial approach without difficulty. No complications with procedure and tolerated well. Patient had decreased pain post-injection.   Knee Injection, LEFT Patient verbally consented to procedure. Risks (including potential rare risk of infection), benefits, and alternatives explained. Sterilely prepped with Chloraprep. Ethyl cholride used for anesthesia. 8 cc Lidocaine 1% mixed with 2 mL Depo-Medrol 40 mg injected using the anteromedial approach without difficulty. No complications with procedure and tolerated well. Patient had decreased pain post-injection.   Follow-up: No Follow-up on file.   Signed,  Elpidio GaleaSpencer T. Katielynn Horan, MD   Patient's Medications  New Prescriptions   No medications on file  Previous Medications   ALBUTEROL (PROVENTIL HFA;VENTOLIN HFA) 108 (90 BASE) MCG/ACT INHALER    Inhale 1-2 puffs into the lungs every 4 (four) hours as needed for wheezing. Okay to fill with ventolin or proventil if  needed.   BUDESONIDE-FORMOTEROL (SYMBICORT) 160-4.5 MCG/ACT INHALER    INHALE 2 PUFFS EVERY 12 HOURS TO PREVENTCOUGH OR WHEEZING--RINSE, GARGLE, & SPITAFTER EACH USE.   ESOMEPRAZOLE MAGNESIUM (NEXIUM PO)    Take by mouth daily.   FLUTICASONE (FLONASE) 50 MCG/ACT NASAL SPRAY    INHALE 2 SPRAYS INTO EACH NOSTRIL EVERY DAY.   METOPROLOL SUCCINATE (TOPROL-XL) 25 MG 24 HR TABLET    Take 1 tablet (25 mg total) by mouth daily.   TIOTROPIUM (SPIRIVA HANDIHALER) 18 MCG INHALATION CAPSULE    Place 1 capsule (18 mcg total) into inhaler and inhale daily.   TRAMADOL (ULTRAM) 50 MG TABLET    TAKE 1 TO 2 TABLETS BY MOUTH EVERY 8 HOURS AS NEEDED FOR PAIN.  Modified Medications   No medications on file  Discontinued Medications   No medications on file

## 2016-10-30 ENCOUNTER — Ambulatory Visit (INDEPENDENT_AMBULATORY_CARE_PROVIDER_SITE_OTHER): Payer: Commercial Managed Care - HMO | Admitting: Family Medicine

## 2016-10-30 ENCOUNTER — Encounter: Payer: Self-pay | Admitting: Family Medicine

## 2016-10-30 VITALS — BP 110/60 | HR 91 | Temp 97.7°F | Wt 127.5 lb

## 2016-10-30 DIAGNOSIS — M542 Cervicalgia: Secondary | ICD-10-CM | POA: Diagnosis not present

## 2016-10-30 DIAGNOSIS — J449 Chronic obstructive pulmonary disease, unspecified: Secondary | ICD-10-CM | POA: Diagnosis not present

## 2016-10-30 DIAGNOSIS — R7989 Other specified abnormal findings of blood chemistry: Secondary | ICD-10-CM

## 2016-10-30 LAB — BASIC METABOLIC PANEL
BUN: 26 mg/dL — ABNORMAL HIGH (ref 6–23)
CALCIUM: 9.6 mg/dL (ref 8.4–10.5)
CHLORIDE: 102 meq/L (ref 96–112)
CO2: 28 meq/L (ref 19–32)
CREATININE: 1.58 mg/dL — AB (ref 0.40–1.20)
GFR: 33.77 mL/min — ABNORMAL LOW (ref >60.00)
GLUCOSE: 114 mg/dL — AB (ref 70–99)
Potassium: 5 mEq/L (ref 3.5–5.1)
Sodium: 137 mEq/L (ref 135–145)

## 2016-10-30 NOTE — Assessment & Plan Note (Signed)
She clearly had less pain when she was on Mobic. We had to stop this with a creatinine elevation. She is not getting any relief from Tylenol. Tramadol is not helping much. It may be that she has transition over to hydrocodone to avoid renal complications. Discussed with patient at the office visit. See notes on follow-up labs.

## 2016-10-30 NOTE — Progress Notes (Signed)
She had more joint aches in the meantime, is still off mobic.  D/w pt about recent labs.  Still on baseline meds o/w.     She recently was injected by Dr. Patsy Lageropland for her knee pain. She was clearly better for a few days, then the pain returned.  Tramadol and tylenol didn't help her pain.    Her R lower back is the most painful.  Her life was clearly better on the mobic.   She can walk farther now that she is taking spiriva, her breathing is some better.    Meds, vitals, and allergies reviewed.   ROS: Per HPI unless specifically indicated in ROS section   nad ncat Neck supple, no LA rrr Scant wheeze and scattered rhonchi, no increased work of breathing. Speaking in complete sentences. Ext w/o edema

## 2016-10-30 NOTE — Assessment & Plan Note (Signed)
She feels better and has increased exercise capacity was Spiriva. Continue as is.

## 2016-10-30 NOTE — Patient Instructions (Addendum)
Go to the lab on the way out.  We'll contact you with your lab report. If your kidney test is better, we may be able to get you to take the 7.5mg  dose.  Another option would be hydrocodone.  Take care.  Glad to see you.

## 2016-10-30 NOTE — Assessment & Plan Note (Signed)
She is now off Mobic. Recheck creatinine today. Discussed with patient at the office visit about possible referral to renal clinic, but this will depend on her follow-up lab results. See notes on labs. Mobic held this point.

## 2016-10-30 NOTE — Progress Notes (Signed)
Pre visit review using our clinic review tool, if applicable. No additional management support is needed unless otherwise documented below in the visit note. 

## 2016-10-31 ENCOUNTER — Other Ambulatory Visit: Payer: Self-pay | Admitting: Family Medicine

## 2016-10-31 MED ORDER — HYDROCODONE-ACETAMINOPHEN 5-325 MG PO TABS: 0.5000 | ORAL_TABLET | Freq: Three times a day (TID) | ORAL | 0 refills | Status: DC | PRN

## 2016-11-02 ENCOUNTER — Telehealth: Payer: Self-pay | Admitting: Family Medicine

## 2016-11-02 NOTE — Telephone Encounter (Signed)
Please call patient. If she continues to have significant knee pain that is not relieved with her current pain medicine, then I want her to see Dr. Patsy Lageropland again see if he can inject her knee with a different medicine that may help. Thanks.

## 2016-11-03 NOTE — Telephone Encounter (Signed)
Patient says she will phone in for an appointment with Dr. Patsy Lageropland if she decides to do so.

## 2016-11-03 NOTE — Telephone Encounter (Signed)
Patient advised.

## 2016-11-03 NOTE — Telephone Encounter (Signed)
Patient said she received a cortisone shot in both knees on 10/22/16.  She wants to know if she should still see Dr.Copland.  Please call patient.

## 2016-11-03 NOTE — Telephone Encounter (Signed)
See below, yes, f/u with Copland.  Thanks.

## 2016-11-06 ENCOUNTER — Ambulatory Visit
Admission: RE | Admit: 2016-11-06 | Discharge: 2016-11-06 | Disposition: A | Discharge status: Home / Self Care | Payer: Commercial Managed Care - HMO | Source: Ambulatory Visit | Attending: Family Medicine | Admitting: Family Medicine

## 2016-11-06 ENCOUNTER — Ambulatory Visit (INDEPENDENT_AMBULATORY_CARE_PROVIDER_SITE_OTHER)
Admission: RE | Admit: 2016-11-06 | Discharge: 2016-11-06 | Disposition: A | Discharge status: Home / Self Care | Payer: Commercial Managed Care - HMO | Source: Ambulatory Visit | Attending: Family Medicine | Admitting: Family Medicine

## 2016-11-06 ENCOUNTER — Ambulatory Visit (INDEPENDENT_AMBULATORY_CARE_PROVIDER_SITE_OTHER): Payer: Commercial Managed Care - HMO | Admitting: Family Medicine

## 2016-11-06 ENCOUNTER — Encounter: Payer: Self-pay | Admitting: Family Medicine

## 2016-11-06 VITALS — BP 120/80 | HR 78 | Temp 97.9°F | Ht 62.5 in | Wt 129.8 lb

## 2016-11-06 DIAGNOSIS — M25561 Pain in right knee: Secondary | ICD-10-CM | POA: Diagnosis not present

## 2016-11-06 DIAGNOSIS — M170 Bilateral primary osteoarthritis of knee: Secondary | ICD-10-CM

## 2016-11-06 DIAGNOSIS — M25562 Pain in left knee: Secondary | ICD-10-CM | POA: Diagnosis not present

## 2016-11-06 MED ORDER — HYLAN G-F 20 48 MG/6ML IX SOSY
48.0000 mg | PREFILLED_SYRINGE | Freq: Once | INTRA_ARTICULAR | Status: AC
  Administered 2016-11-06: 48 mg via INTRA_ARTICULAR

## 2016-11-06 NOTE — Progress Notes (Signed)
Pre visit review using our clinic review tool, if applicable. No additional management support is needed unless otherwise documented below in the visit note. 

## 2016-11-06 NOTE — Progress Notes (Signed)
Dr. Karleen HampshireSpencer T. Dominique Valtierra, MD, CAQ Sports Medicine Primary Care and Sports Medicine [address] [place], [id number] Phone: [phone] Fax: [phone]  11/06/2016  Patient: Dominique Clark Lineman, MRN: [id number], DOB: 07/04/1941, 75 y.o.  Primary Physician:  Crawford GivensGraham Duncan, MD   Chief Complaint  Patient presents with  . Follow-up    Knee Pain   Subjective:   Dominique Clark Holladay is a 75 y.o. very pleasant female patient who presents with the following:  Dg Knee Ap/lat W/sunrise Left  Result Date: 11/06/2016 CLINICAL DATA:  Bilateral knee pain. EXAM: LEFT KNEE 3 VIEWS; RIGHT KNEE 3 VIEWS COMPARISON:  None in PACs FINDINGS: Right knee: The bones are subjectively osteopenic. There is high-grade loss of the joint space of the medial compartment. There is milder narrowing of the lateral and patellofemoral joint spaces. There is beaking of the tibial spines. There are exuberant osteophytes arising from the articular margins of the patella. There is no acute fracture. There is no definite joint effusion. Left knee: There is severe joint space loss of the medial compartment. There is milder loss laterally. There is mild narrowing of the patellofemoral joint space. Spurs arise from the articular margins of the patella. There is no acute fracture. There are vascular calcifications. There is no definite joint effusion. IMPRESSION: Moderate to severe joint space loss of the medial compartments bilaterally with milder joint space loss of the lateral and patellofemoral compartments. Significant osteophyte formation especially of the patellae. No acute fractures nor dislocation. Electronically Signed   By: David  SwazilandJordan M.D.   On: 11/06/2016 10:59   Dg Knee Ap/lat W/sunrise Right  Result Date: 11/06/2016 CLINICAL DATA:  Bilateral knee pain. EXAM: LEFT KNEE 3 VIEWS; RIGHT KNEE 3 VIEWS COMPARISON:  None in PACs FINDINGS: Right knee: The bones are subjectively osteopenic. There is high-grade loss of the joint space  of the medial compartment. There is milder narrowing of the lateral and patellofemoral joint spaces. There is beaking of the tibial spines. There are exuberant osteophytes arising from the articular margins of the patella. There is no acute fracture. There is no definite joint effusion. Left knee: There is severe joint space loss of the medial compartment. There is milder loss laterally. There is mild narrowing of the patellofemoral joint space. Spurs arise from the articular margins of the patella. There is no acute fracture. There are vascular calcifications. There is no definite joint effusion. IMPRESSION: Moderate to severe joint space loss of the medial compartments bilaterally with milder joint space loss of the lateral and patellofemoral compartments. Significant osteophyte formation especially of the patellae. No acute fractures nor dislocation. Electronically Signed   By: David  [place]il[place]Jordan M.D.   On: 11/06/2016 10:59    Well-known patient with end-stage osteoarthritic pain.  We did some corticosteroid injections very recently, but it did not help significantly.  Radiographs today also showed some bone-on-bone pathology.  She is not ready for joint replacement and is never tried the hyaluronic acid  Will do a trial of bilateral Synvisc today.  Knee Injection: Synvisc-One, R Patient verbally consented to procedure. Risks (including infection), benefits, and alternatives explained. Sterilely prepped with Chloraprep. Ethyl cholride used for anesthesia, then 7 cc of Lidocaine 1% used for anesthesia in the anterolateral position. Reprepped with Chloraprep.  Anterolateral approach used to inject joint without difficulty, injected with Synvisc-One, 6 mL. No complications with procedure and tolerated well.   Knee Injection: Synvisc-One, L Patient verbally consented to procedure. Risks (including infection), benefits, and alternatives  explained. Sterilely prepped with Chloraprep. Ethyl cholride used for  anesthesia, then 7 cc of Lidocaine 1% used for anesthesia in the anterolateral position. Reprepped with Chloraprep.  Anterolateral approach used to inject joint without difficulty, injected with Synvisc-One, 6 mL. No complications with procedure and tolerated well.   Signed,  Elpidio GaleaSpencer T. Xiamara Hulet, MD

## 2016-11-10 ENCOUNTER — Other Ambulatory Visit: Payer: Self-pay | Admitting: *Deleted

## 2016-11-10 NOTE — Telephone Encounter (Signed)
Patient left a voicemail requesting refill on her pain medication. Call when ready for pickup

## 2016-11-11 MED ORDER — HYDROCODONE-ACETAMINOPHEN 5-325 MG PO TABS: 0.5000 | ORAL_TABLET | Freq: Three times a day (TID) | ORAL | 0 refills | Status: DC | PRN

## 2016-11-11 NOTE — Telephone Encounter (Signed)
Patient advised.  Rx left at front desk for pick up. 

## 2016-11-11 NOTE — Telephone Encounter (Signed)
Pt aware.

## 2016-11-11 NOTE — Telephone Encounter (Signed)
Printed.  Thanks.  

## 2016-11-24 DIAGNOSIS — N183 Chronic kidney disease, stage 3 (moderate): Secondary | ICD-10-CM | POA: Diagnosis not present

## 2016-11-24 DIAGNOSIS — N140 Analgesic nephropathy: Secondary | ICD-10-CM | POA: Diagnosis not present

## 2016-11-24 DIAGNOSIS — N390 Urinary tract infection, site not specified: Secondary | ICD-10-CM | POA: Diagnosis not present

## 2016-11-25 ENCOUNTER — Other Ambulatory Visit: Payer: Self-pay | Admitting: Nephrology

## 2016-11-25 ENCOUNTER — Other Ambulatory Visit: Payer: Self-pay

## 2016-11-25 DIAGNOSIS — N183 Chronic kidney disease, stage 3 unspecified: Secondary | ICD-10-CM

## 2016-11-25 NOTE — Telephone Encounter (Signed)
Pt left a message on triage line asking for a refill of hydrocodone. Last written 11-11-16 #28. Last OV 11-06-16 Next OV 12-01-16

## 2016-11-26 MED ORDER — HYDROCODONE-ACETAMINOPHEN 5-325 MG PO TABS: 0.5000 | ORAL_TABLET | Freq: Three times a day (TID) | ORAL | 0 refills | Status: DC | PRN

## 2016-11-26 NOTE — Telephone Encounter (Signed)
Printed.  Thanks.  

## 2016-11-26 NOTE — Telephone Encounter (Signed)
Patient notified by telephone that script is up front ready for pickup. 

## 2016-12-01 ENCOUNTER — Ambulatory Visit
Admission: RE | Admit: 2016-12-01 | Discharge: 2016-12-01 | Disposition: A | Discharge status: Home / Self Care | Payer: Commercial Managed Care - HMO | Source: Ambulatory Visit | Attending: Nephrology | Admitting: Nephrology

## 2016-12-01 DIAGNOSIS — N189 Chronic kidney disease, unspecified: Secondary | ICD-10-CM | POA: Diagnosis not present

## 2016-12-01 DIAGNOSIS — N183 Chronic kidney disease, stage 3 unspecified: Secondary | ICD-10-CM

## 2016-12-10 ENCOUNTER — Other Ambulatory Visit: Payer: Self-pay | Admitting: *Deleted

## 2016-12-10 NOTE — Telephone Encounter (Signed)
Patient called stating that she needs a refill on her Hydrocodone Last refill 11/26/16 #28 Last office visit 11/06/16/acute

## 2016-12-11 MED ORDER — HYDROCODONE-ACETAMINOPHEN 5-325 MG PO TABS: 0.5000 | ORAL_TABLET | Freq: Three times a day (TID) | ORAL | 0 refills | Status: DC | PRN

## 2016-12-11 NOTE — Telephone Encounter (Signed)
Left detailed message on voicemail. Rx left at front desk for pick up.  

## 2016-12-11 NOTE — Telephone Encounter (Signed)
Printed.  Thanks.  

## 2016-12-24 ENCOUNTER — Ambulatory Visit: Payer: Commercial Managed Care - HMO | Admitting: Family Medicine

## 2016-12-25 ENCOUNTER — Ambulatory Visit (INDEPENDENT_AMBULATORY_CARE_PROVIDER_SITE_OTHER): Payer: Commercial Managed Care - HMO | Admitting: Family Medicine

## 2016-12-25 ENCOUNTER — Other Ambulatory Visit: Payer: Self-pay

## 2016-12-25 ENCOUNTER — Encounter: Payer: Self-pay | Admitting: Family Medicine

## 2016-12-25 VITALS — BP 110/62 | HR 74 | Temp 97.4°F | Wt 132.0 lb

## 2016-12-25 DIAGNOSIS — M25562 Pain in left knee: Secondary | ICD-10-CM | POA: Diagnosis not present

## 2016-12-25 DIAGNOSIS — J441 Chronic obstructive pulmonary disease with (acute) exacerbation: Secondary | ICD-10-CM | POA: Diagnosis not present

## 2016-12-25 DIAGNOSIS — M25561 Pain in right knee: Secondary | ICD-10-CM

## 2016-12-25 MED ORDER — HYDROCODONE-ACETAMINOPHEN 5-325 MG PO TABS: 0.5000 | ORAL_TABLET | Freq: Three times a day (TID) | ORAL | 0 refills | Status: DC | PRN

## 2016-12-25 MED ORDER — DOXYCYCLINE HYCLATE 100 MG PO CAPS: 100.0000 mg | ORAL_CAPSULE | Freq: Two times a day (BID) | ORAL | 0 refills | Status: DC

## 2016-12-25 MED ORDER — PREDNISONE 20 MG PO TABS: ORAL_TABLET | ORAL | 0 refills | Status: DC

## 2016-12-25 NOTE — Progress Notes (Signed)
   Subjective:    Patient ID: Dominique Clark, female    DOB: 12/01/1941, 75 y.o.   MRN: [id number]  HPI This is a 75 yo female who presents today with cough and chest congestion for a week. Sputum is thick and green. Wheezing, no SOB. Started with sore throat. Schwebke nasal drainage. No fever, some ear pain. Has been taking mucinex. Known COPD, uses spiriva and albuterol up to every 4 hours. Robitussin with some relief.  She is requesting more vicodan refill. She had to be switched from Meloxicam due to worsening kidney function. Takes intermittently. No sick contacts.    Past Medical History:  Diagnosis Date  . COPD (chronic obstructive pulmonary disease) (HCC)   . Diabetes mellitus    type 2  . Diverticulosis    s/p colonoscopy  . Gastritis    via EGD  . Hiatal hernia    s/p dilation 2004  . Osteopenia   . Stress incontinence, female    Past Surgical History:  Procedure Laterality Date  . BLADDER REPAIR  9/04   bladder tack   . BREAST CYST EXCISION Right 1972  . TONSILLECTOMY     age 75  . TOTAL ABDOMINAL HYSTERECTOMY     Family History  Problem Relation Age of Onset  . Stroke Mother   . Breast cancer Neg Hx   . Colon cancer Neg Hx    Social History  Substance Use Topics  . Smoking status: Former Smoker    Types: Cigarettes    Quit date: 12/29/1988  . Smokeless tobacco: Never Used     Comment: 20 or more years   . Alcohol use No      Review of Systems Per HPI    Objective:   Physical Exam  Constitutional: She is oriented to person, place, and time. She appears well-developed and well-nourished.  HENT:  Head: Normocephalic and atraumatic.  Right Ear: External ear normal.  Left Ear: External ear normal.  Nose: Nose normal.  Mouth/Throat: Oropharynx is clear and moist.  Eyes: Conjunctivae are normal.  Cardiovascular: Normal rate, regular rhythm and normal heart sounds.   Pulmonary/Chest: Effort normal. She has wheezes (few scattered fine).  Neurological: She  is alert and oriented to person, place, and time.  Skin: Skin is warm and dry.  Psychiatric: She has a normal mood and affect. Her behavior is normal. Judgment and thought content normal.  Vitals reviewed.     BP 110/62   Pulse 74   Temp 97.4 F (36.3 C)   Wt 132 lb (59.9 kg)   SpO2 97%   BMI 23.76 kg/m  Wt Readings from Last 3 Encounters:  12/25/16 132 lb (59.9 kg)  11/06/16 129 lb 12 oz (58.9 kg)  10/30/16 127 lb 8 oz (57.8 kg)       Assessment & Plan:  1. COPD exacerbation (HCC) - Provided written and verbal information regarding diagnosis and treatment. - RTC/ER precautions reviwed - continue inhalers, mucinex - doxycycline (VIBRAMYCIN) 100 MG capsule; Take 1 capsule (100 mg total) by mouth 2 (two) times daily.  Dispense: 14 capsule; Refill: 0 - predniSONE (DELTASONE) 20 MG tablet; Take 2 tablets for 3 days then 1 tablet for 4 days  Dispense: 10 tablet; Refill: 0  2. Arthralgia of both knees - hydrocodone-acetaminophen 5-325, 1/2-1 po TID prn, number 28, no refills   Olean Reeeborah Joevon Holliman, FNP-BC  [facility], [facility]  12/25/2016 3:03 PM

## 2016-12-25 NOTE — Patient Instructions (Signed)
Continue Mucinex, drink enough water to keep your urine light yellow For nasal congestion you can use Afrin nasal spray for 3 days max, saline nasal spray (generic is fine for all). For cough you can try Delsym. Drink enough fluids to make your urine light yellow.  Please come back in if you are not better in 5-7 days or if you develop wheezing, shortness of breath or persistent vomiting.

## 2016-12-25 NOTE — Telephone Encounter (Signed)
PT IS HERE IN THE OFFICE SEEING dEBBIE FOR A COUGH, SHE WOULD LIKE A REFILL.  LAST REFILL 12/11/16 #28. PLEASE ADVISE.

## 2016-12-25 NOTE — Telephone Encounter (Signed)
Done.  Rx left at front desk for pick up.

## 2016-12-25 NOTE — Telephone Encounter (Signed)
Printed.  Thanks.  

## 2016-12-26 ENCOUNTER — Telehealth: Payer: Self-pay

## 2016-12-26 MED ORDER — AZITHROMYCIN 250 MG PO TABS: ORAL_TABLET | ORAL | 0 refills | Status: DC

## 2016-12-26 NOTE — Telephone Encounter (Signed)
Pt left /vm; pt seen 12/25/16 and given abx that is causing N&V. Pt request different abx; [facility]. Pt request cb.

## 2016-12-26 NOTE — Telephone Encounter (Signed)
Called and spoke with patient, had nausea with doxy. Sent in [facility] to [facility].

## 2017-01-01 ENCOUNTER — Telehealth: Payer: Self-pay

## 2017-01-01 NOTE — Telephone Encounter (Signed)
Pt was seen on 12/25/16 she was prescribed prednisone and a zpak, "she said that usually 2 rounds of these medications would get rid of this since she has COPD". She said she's finished with this medications and she's not doing better, coughing a lot, wheezing, sob at night. Per last notes pt to come back to see you if she didn't improve within 5-7 days. I made an appt for tomorrow. She denied chills or fever.

## 2017-01-02 ENCOUNTER — Encounter: Payer: Self-pay | Admitting: Family Medicine

## 2017-01-02 ENCOUNTER — Ambulatory Visit (INDEPENDENT_AMBULATORY_CARE_PROVIDER_SITE_OTHER): Payer: Medicare HMO | Admitting: Family Medicine

## 2017-01-02 VITALS — BP 138/70 | HR 67 | Temp 97.6°F | Wt 134.0 lb

## 2017-01-02 DIAGNOSIS — M25561 Pain in right knee: Secondary | ICD-10-CM

## 2017-01-02 DIAGNOSIS — J441 Chronic obstructive pulmonary disease with (acute) exacerbation: Secondary | ICD-10-CM

## 2017-01-02 DIAGNOSIS — M25562 Pain in left knee: Secondary | ICD-10-CM

## 2017-01-02 MED ORDER — HYDROCODONE-ACETAMINOPHEN 5-325 MG PO TABS: 0.5000 | ORAL_TABLET | Freq: Three times a day (TID) | ORAL | 0 refills | Status: DC | PRN

## 2017-01-02 NOTE — Patient Instructions (Signed)
Please continue to drink a lot of water Take your Mucinex to thin you phlegm Use your albuterol inhaler every 6 hours as needed for wheeze

## 2017-01-02 NOTE — Progress Notes (Signed)
Subjective:    Patient ID: Dominique Clark, female    DOB: 07/29/1941, 76 y.o.   MRN: [id number]  HPI This isa 76 yo female who presents today in follow up from 12/25/16. She was seen with acute exacerbation of COPD and given course of azithromycin and prednisone (40 mg x 3 days then 20 mg x 4 days). Finished azithromycin a day or two ago. Thinks she needs another round. Today she reports that she is feeling better but is still wheezing and having a productive cough. Sputum was yellow green and is now white. Has not been taking Mucinex.  Requests refill of pain medication, does not have enough to last until visit with Dr. Para Marchuncan next week.   Past Medical History:  Diagnosis Date  . COPD (chronic obstructive pulmonary disease) (HCC)   . Diabetes mellitus    type 2  . Diverticulosis    s/p colonoscopy  . Gastritis    via EGD  . Hiatal hernia    s/p dilation 2004  . Osteopenia   . Stress incontinence, female    Past Surgical History:  Procedure Laterality Date  . BLADDER REPAIR  9/04   bladder tack   . BREAST CYST EXCISION Right 1972  . TONSILLECTOMY     age 76  . TOTAL ABDOMINAL HYSTERECTOMY     Family History  Problem Relation Age of Onset  . Stroke Mother   . Breast cancer Neg Hx   . Colon cancer Neg Hx    Social History  Substance Use Topics  . Smoking status: Former Smoker    Types: Cigarettes    Quit date: 12/29/1988  . Smokeless tobacco: Never Used     Comment: 20 or more years   . Alcohol use No       Review of Systems Per HPI    Objective:   Physical Exam  Constitutional: She is oriented to person, place, and time. She appears well-developed and well-nourished. No distress.  HENT:  Head: Normocephalic and atraumatic.  Cardiovascular: Normal rate, regular rhythm and normal heart sounds.   Pulmonary/Chest: Effort normal and breath sounds normal.  Musculoskeletal: Normal range of motion.  Neurological: She is alert and oriented to person, place, and time.   Skin: Skin is warm and dry. She is not diaphoretic.  Psychiatric: She has a normal mood and affect. Her behavior is normal. Judgment and thought content normal.  Vitals reviewed.     BP 138/70 (BP Location: Left Arm, Patient Position: Sitting, Cuff Size: Normal)   Pulse 67   Temp 97.6 F (36.4 C) (Oral)   Wt 134 lb (60.8 kg)   SpO2 97%   BMI 24.12 kg/m  Wt Readings from Last 3 Encounters:  01/02/17 134 lb (60.8 kg)  12/25/16 132 lb (59.9 kg)  11/06/16 129 lb 12 oz (58.9 kg)       Assessment & Plan:  1. COPD exacerbation (HCC) - improving, just finished azithromycin, do not think she needs additional antibiotic therapy at this time -  Patient Instructions  Please continue to drink a lot of water Take your Mucinex to thin you phlegm Use your albuterol inhaler every 6 hours as needed for wheeze   2. Arthralgia of both knees - HYDROcodone-acetaminophen (NORCO/VICODIN) 5-325 MG tablet; Take 0.5-1 tablets by mouth 3 (three) times daily as needed for moderate pain or severe pain.  Dispense: 28 tablet; Refill: 0 - follow up with Dr. Para Marchuncan in 4 days as scheduled  Dominique Reeeborah Gessner, FNP-BC  [facility], [facility]  01/02/2017 1:03 PM

## 2017-01-02 NOTE — Progress Notes (Signed)
Pre visit review using our clinic review tool, if applicable. No additional management support is needed unless otherwise documented below in the visit note. 

## 2017-01-06 ENCOUNTER — Ambulatory Visit: Payer: Commercial Managed Care - HMO | Admitting: Family Medicine

## 2017-01-16 ENCOUNTER — Other Ambulatory Visit: Payer: Commercial Managed Care - HMO

## 2017-01-16 ENCOUNTER — Telehealth: Payer: Self-pay

## 2017-01-16 NOTE — Telephone Encounter (Signed)
Pt has appt scheduled 01/19/17. FYI to Dr Para Marchuncan.

## 2017-01-16 NOTE — Telephone Encounter (Signed)
PLEASE NOTE: All timestamps contained within this report are represented as [place] Standard Time. CONFIDENTIALTY NOTICE: This fax transmission is intended only for the addressee. It contains information that is legally privileged, confidential or otherwise protected from use or disclosure. If you are not the intended recipient, you are strictly prohibited from reviewing, disclosing, copying using or disseminating any of this information or taking any action in reliance on or regarding this information. If you have received this fax in error, please notify us immediately by telephone so that we can arrange for its return to us. Phone: [phone], Toll-Free: [phone], Fax: [phone] Page: 1 of 1 Call Id: [id number] [facility] Client Nonclinical Telephone Record [facility] Client [facility] - [facility] Physician Raechel Acheuncan, Shaw - MD Contact Type Call Who Is Calling Patient / Member / Family / Caregiver Caller Name Dominique Clark, 02/23/1941 Caller Phone Number [phone] Patient Name Dominique Clark, 06/19/1941 Call Type Message Only Information Provided Reason for Call [facility] Initial Comment Additional Comment Caller needing to cancel appt for 7:50 in the morning Call Closed By: Letitia NeriLori Woolum Transaction Date/Time: 01/15/2017 12:49:57 PM (ET)

## 2017-01-19 ENCOUNTER — Encounter: Payer: Self-pay | Admitting: Family Medicine

## 2017-01-19 ENCOUNTER — Ambulatory Visit (INDEPENDENT_AMBULATORY_CARE_PROVIDER_SITE_OTHER): Payer: Commercial Managed Care - HMO | Admitting: Family Medicine

## 2017-01-19 ENCOUNTER — Other Ambulatory Visit: Payer: Commercial Managed Care - HMO

## 2017-01-19 VITALS — BP 148/80 | HR 70 | Temp 97.8°F | Wt 129.5 lb

## 2017-01-19 DIAGNOSIS — H8110 Benign paroxysmal vertigo, unspecified ear: Secondary | ICD-10-CM | POA: Diagnosis not present

## 2017-01-19 DIAGNOSIS — M25561 Pain in right knee: Secondary | ICD-10-CM | POA: Diagnosis not present

## 2017-01-19 DIAGNOSIS — M25562 Pain in left knee: Secondary | ICD-10-CM

## 2017-01-19 DIAGNOSIS — E119 Type 2 diabetes mellitus without complications: Secondary | ICD-10-CM

## 2017-01-19 DIAGNOSIS — R739 Hyperglycemia, unspecified: Secondary | ICD-10-CM | POA: Diagnosis not present

## 2017-01-19 DIAGNOSIS — R7989 Other specified abnormal findings of blood chemistry: Secondary | ICD-10-CM | POA: Diagnosis not present

## 2017-01-19 DIAGNOSIS — J441 Chronic obstructive pulmonary disease with (acute) exacerbation: Secondary | ICD-10-CM

## 2017-01-19 DIAGNOSIS — G8929 Other chronic pain: Secondary | ICD-10-CM

## 2017-01-19 LAB — BASIC METABOLIC PANEL
BUN: 20 mg/dL (ref 6–23)
CO2: 30 mEq/L (ref 19–32)
Calcium: 9.9 mg/dL (ref 8.4–10.5)
Chloride: 103 mEq/L (ref 96–112)
Creatinine, Ser: 1.33 mg/dL — ABNORMAL HIGH (ref 0.40–1.20)
GFR: 41.18 mL/min — ABNORMAL LOW (ref >60.00)
Glucose, Bld: 97 mg/dL (ref 70–99)
POTASSIUM: 5 meq/L (ref 3.5–5.1)
SODIUM: 139 meq/L (ref 135–145)

## 2017-01-19 LAB — HEMOGLOBIN A1C: HEMOGLOBIN A1C: 6 % (ref 4.6–6.5)

## 2017-01-19 MED ORDER — MELOXICAM 15 MG PO TABS: 7.5000 mg | ORAL_TABLET | Freq: Every day | ORAL | Status: DC

## 2017-01-19 MED ORDER — AMOXICILLIN-POT CLAVULANATE 875-125 MG PO TABS: 1.0000 | ORAL_TABLET | Freq: Two times a day (BID) | ORAL | 0 refills | Status: DC

## 2017-01-19 MED ORDER — HYDROCODONE-ACETAMINOPHEN 5-325 MG PO TABS: 0.5000 | ORAL_TABLET | Freq: Three times a day (TID) | ORAL | 0 refills | Status: DC | PRN

## 2017-01-19 MED ORDER — PREDNISONE 20 MG PO TABS: ORAL_TABLET | ORAL | 0 refills | Status: DC

## 2017-01-19 NOTE — Progress Notes (Signed)
Still with joint pain, B knee pain, improved some with prev injection per Dr. Patsy Lageropland.  Still on prn hydrocodone.  No ADE on med. D/w pt.   Cold sx.  Cough for weeks, since 12/22/16.  No fevers.  Still on inhalers.  Cough, with green sputum. She was initially doing better with her breathing until this recent episode.  No vomiting, no diarrhea.  No rash.  Some wheeze.  ST in the AM.  Some nosebleeds but not having rhinorrhea o/w.  No nosebleeds in the last few days.    She feels "dizzy" w/o syncope when sitting in the beautician's chair.  She reports she rests with her head back at the time and "I feel out of it" but she initially can't clarify if she is lightheaded or having vertigo, or if it happens when she is starting to lay back in the chair or when she is setting up.  Then she says it isn't vertigo and only happens when moving to sit up.  She also has possible vertigo when turning over in bed w/o getting up.  "My head spins."  Has been going on for about 1 month.  Then she contradicts this report and states she doesn't have vertigo with turning over in the bed.  I gave detailed questions in isolation: lightheaded vs room spinning, at home, in/out of bed, before/after getting out of bed, before/after getting out of beautician's chair.  She kept changing her responses with different answers to the same question.    CKD.  Has seen renal.  D/w pt about NSAIDS and functional status.  Due for A1c and BMET.   No DM2 meds now.  D/w pt.    Meds, vitals, and allergies reviewed.   ROS: Per HPI unless specifically indicated in ROS section   GEN: nad, alert and oriented HEENT: mucous membranes moist, tm w/o erythema, nasal exam w/o erythema, clear discharge noted,  OP with cobblestoning NECK: supple w/o LA CV: rrr.   PULM: ctab except for scant B wheeze, no inc wob, speaking in complete sentences.   EXT: no edema SKIN: no acute rash No bruit on neck B CN2-12 grossly wnl, S/S wnl x4

## 2017-01-19 NOTE — Assessment & Plan Note (Signed)
Continue low dose nsaid when not on prednisone, see notes on labs.

## 2017-01-19 NOTE — Assessment & Plan Note (Signed)
Continue low dose nsaid when not on prednisone, see notes on labs.  

## 2017-01-19 NOTE — Patient Instructions (Addendum)
Go to the lab on the way out.  We'll contact you with your lab report. Restart prednisone.  Hold meloxicam while on prednisone.   See when you notice the "out of it" feeling.  See if you have lightheaded feeling when getting up but not moving your head.  See if you have a sensation of movement or rotation when rolling over in the bed.  See what kind of feelings you have when getting out of the beauty chair- get up slowly.  Take care.  Glad to see you.

## 2017-01-19 NOTE — Assessment & Plan Note (Signed)
See notes on labs. 

## 2017-01-19 NOTE — Assessment & Plan Note (Signed)
See AVS re: meds and pred use.  See med orders.  Okay for outpatient f/u. D/w pt.  Start augmentin, continue inhalers.

## 2017-01-19 NOTE — Assessment & Plan Note (Addendum)
BPV vs lightheaded vs both.   She contradicts her answers and states she doesn't have vertigo with turning over in the bed.  I gave detailed questions in isolation: lightheaded vs room spinning, at home, in/out of bed, before/after getting out of bed, before/after getting out of beautician's chair.  She kept changing her responses with different answers to the same question.  I asked her to monitor her sx and let me know.  At this point, I can't really work up her sx w/o information from the patient.  No bruit, no focal neuro findings.  I'll await clarification from patient.  >25 minutes spent in face to face time with patient, >50% spent in counselling or coordination of care.

## 2017-01-19 NOTE — Progress Notes (Signed)
Pre visit review using our clinic review tool, if applicable. No additional management support is needed unless otherwise documented below in the visit note. 

## 2017-02-02 ENCOUNTER — Other Ambulatory Visit: Payer: Self-pay

## 2017-02-02 DIAGNOSIS — M25561 Pain in right knee: Secondary | ICD-10-CM

## 2017-02-02 DIAGNOSIS — M25562 Pain in left knee: Principal | ICD-10-CM

## 2017-02-02 NOTE — Telephone Encounter (Signed)
Pt left v/m; pt was seen 01/19/17; pt request rx hydrocodone apap. Last printed # 28 on 01/19/17.

## 2017-02-03 MED ORDER — HYDROCODONE-ACETAMINOPHEN 5-325 MG PO TABS: 0.5000 | ORAL_TABLET | Freq: Three times a day (TID) | ORAL | 0 refills | Status: DC | PRN

## 2017-02-03 NOTE — Telephone Encounter (Signed)
Patient advised RX ready for pick up.. 

## 2017-02-03 NOTE — Telephone Encounter (Signed)
Printed.  Thanks.  

## 2017-02-11 ENCOUNTER — Ambulatory Visit (INDEPENDENT_AMBULATORY_CARE_PROVIDER_SITE_OTHER): Payer: Medicare HMO | Admitting: Primary Care

## 2017-02-11 ENCOUNTER — Encounter: Payer: Self-pay | Admitting: Primary Care

## 2017-02-11 VITALS — BP 138/74 | HR 81 | Temp 98.1°F | Wt 135.8 lb

## 2017-02-11 DIAGNOSIS — J069 Acute upper respiratory infection, unspecified: Secondary | ICD-10-CM | POA: Diagnosis not present

## 2017-02-11 NOTE — Progress Notes (Signed)
Pre visit review using our clinic review tool, if applicable. No additional management support is needed unless otherwise documented below in the visit note. 

## 2017-02-11 NOTE — Progress Notes (Signed)
Subjective:    Patient ID: Thomes DinningElsie I Krutz, female    DOB: 03/04/1941, 76 y.o.   MRN: [id number]  HPI  Ms. Farrior is a 76 year old female with a history of COPD, GERD who presents today with a chief complaint of cough. She also reports chest congestion, shortness of breath, headaches, fatigue.  She was evaluated and treated for COPD with acute exacerbation on 01/19/17. She was prescribed courses of Prednisone and Augmentin, and encouraged to use inhalers.  Her recent symptoms began 2 days ago. She completed courses of Augmentin and Prednisone. She's been using Symbicort, Spiriva, and albuterol. She had her flu shot this season. She denies sick/influenza contacts, fevers. She does feel better than she did during her visit on 01/22.  Review of Systems  Constitutional: Positive for chills and fatigue. Negative for fever.  HENT: Positive for congestion and sinus pressure. Negative for ear pain and sore throat.   Respiratory: Positive for cough and shortness of breath. Negative for wheezing.   Cardiovascular: Negative for chest pain.       Past Medical History:  Diagnosis Date  . COPD (chronic obstructive pulmonary disease) (HCC)   . Diabetes mellitus    type 2  . Diverticulosis    s/p colonoscopy  . Gastritis    via EGD  . Hiatal hernia    s/p dilation 2004  . Osteopenia   . Stress incontinence, female      Social History   Social History  . Marital status: Single    Spouse name: N/A  . Number of children: 2  . Years of education: N/A   Occupational History  . [employer]  Retired    [employer]   Social History Main Topics  . Smoking status: Former Smoker    Types: Cigarettes    Quit date: 12/29/1988  . Smokeless tobacco: Never Used     Comment: 20 or more years   . Alcohol use No  . Drug use: No  . Sexual activity: No   Other Topics Concern  . Not on file   Social History Narrative   Marital Status: divorced   Children: 2 children, 1 grandchild   Occupation:  retired from [employer]   [employer] fan    Past Surgical History:  Procedure Laterality Date  . BLADDER REPAIR  9/04   bladder tack   . BREAST CYST EXCISION Right 1972  . TONSILLECTOMY     age 76  . TOTAL ABDOMINAL HYSTERECTOMY      Family History  Problem Relation Age of Onset  . Stroke Mother   . Breast cancer Neg Hx   . Colon cancer Neg Hx     Allergies  Allergen Reactions  . Doxycycline Nausea And Vomiting  . Meloxicam Other (See Comments)    Stopped due to increase in creatinine October 2017    Current Outpatient Prescriptions on File Prior to Visit  Medication Sig Dispense Refill  . albuterol (PROVENTIL HFA;VENTOLIN HFA) 108 (90 Base) MCG/ACT inhaler Inhale 1-2 puffs into the lungs every 4 (four) hours as needed for wheezing. Okay to fill with ventolin or proventil if needed. 3 Inhaler 3  . budesonide-formoterol (SYMBICORT) 160-4.5 MCG/ACT inhaler INHALE 2 PUFFS EVERY 12 HOURS TO PREVENTCOUGH OR WHEEZING--RINSE, GARGLE, & SPITAFTER EACH USE. 3 Inhaler 3  . Esomeprazole Magnesium (NEXIUM PO) Take by mouth daily.    . fluticasone (FLONASE) 50 MCG/ACT nasal spray INHALE 2 SPRAYS INTO EACH NOSTRIL EVERY DAY. 48  g 3  . HYDROcodone-acetaminophen (NORCO/VICODIN) 5-325 MG tablet Take 0.5-1 tablets by mouth 3 (three) times daily as needed for moderate pain or severe pain. 28 tablet 0  . meloxicam (MOBIC) 15 MG tablet Take 0.5 tablets (7.5 mg total) by mouth daily.    . metoprolol succinate (TOPROL-XL) 25 MG 24 hr tablet Take 1 tablet (25 mg total) by mouth daily. 90 tablet 3  . tiotropium (SPIRIVA HANDIHALER) 18 MCG inhalation capsule Place 1 capsule (18 mcg total) into inhaler and inhale daily. 90 capsule 3   No current facility-administered medications on file prior to visit.     BP 138/74   Pulse 81   Temp 98.1 F (36.7 C) (Oral)   Wt 135 lb 12.8 oz (61.6 kg)   SpO2 93%   BMI 24.44 kg/m    Objective:   Physical Exam  Constitutional: She  appears well-nourished. She does not appear ill.  HENT:  Right Ear: Tympanic membrane and ear canal normal.  Left Ear: Tympanic membrane and ear canal normal.  Nose: Right sinus exhibits no maxillary sinus tenderness and no frontal sinus tenderness. Left sinus exhibits no maxillary sinus tenderness and no frontal sinus tenderness.  Mouth/Throat: Oropharynx is clear and moist.  Eyes: Conjunctivae are normal.  Neck: Neck supple.  Cardiovascular: Normal rate and regular rhythm.   Pulmonary/Chest: Effort normal. She has no decreased breath sounds. She has no wheezes. She has rhonchi in the right upper field and the left upper field. She has no rales.  Rhonchi mild.  Lymphadenopathy:    She has no cervical adenopathy.  Skin: Skin is warm and dry.          Assessment & Plan:  URI:  Cough, congestion, fatigue, chills, body aches x 2 days. Recently treated with Prednisone and Augmentin, completed courses. Exam today overall unremarkable. She does not appear acutely ill, lungs with mild rhonchi (hx of COPD), vitals stable.  Symptoms and presentation suggestive of viral involvement. Offered Chest xray to ensure last infection had resolved, she kindly declined. Discussed use of Robitussin, tylenol (hold vicodin), fluids, continue inhalers. Strict return precautions provided.  Morrie Sheldon, NP

## 2017-02-11 NOTE — Patient Instructions (Addendum)
Your symptoms are representative of a viral illness which will resolve on its own over time. Our goal is to treat your symptoms in order to aid your body in the healing process and to make you more comfortable.   Start Robitussin for cough.   Start Tylenol as needed for chills, body aches, and pain. Do not take the Vicodin when taking tylenol.  Continue to use your inhalers for shortness of breath.   Complete xray(s) prior to leaving today. I will notify you of your results once received.  It was a pleasure meeting you!

## 2017-02-12 ENCOUNTER — Ambulatory Visit: Payer: Commercial Managed Care - HMO | Admitting: Primary Care

## 2017-02-19 ENCOUNTER — Other Ambulatory Visit: Payer: Self-pay

## 2017-02-19 DIAGNOSIS — M25562 Pain in left knee: Principal | ICD-10-CM

## 2017-02-19 DIAGNOSIS — M25561 Pain in right knee: Secondary | ICD-10-CM

## 2017-02-19 MED ORDER — HYDROCODONE-ACETAMINOPHEN 5-325 MG PO TABS: 0.5000 | ORAL_TABLET | Freq: Three times a day (TID) | ORAL | 0 refills | Status: DC | PRN

## 2017-02-19 NOTE — Telephone Encounter (Signed)
Pt left v/m requesting rx hydrocodone apap. Call when ready for pick up. rx last printed # 28 on 02/03/17. Last seen 01/19/17.

## 2017-02-19 NOTE — Telephone Encounter (Signed)
Patient was notified Rx is ready for pick up. Left in front office.

## 2017-02-27 ENCOUNTER — Ambulatory Visit (INDEPENDENT_AMBULATORY_CARE_PROVIDER_SITE_OTHER): Payer: Medicare HMO | Admitting: Family Medicine

## 2017-02-27 ENCOUNTER — Encounter: Payer: Self-pay | Admitting: Family Medicine

## 2017-02-27 ENCOUNTER — Ambulatory Visit (INDEPENDENT_AMBULATORY_CARE_PROVIDER_SITE_OTHER)
Admission: RE | Admit: 2017-02-27 | Discharge: 2017-02-27 | Disposition: A | Discharge status: Home / Self Care | Payer: Medicare HMO | Source: Ambulatory Visit | Attending: Family Medicine | Admitting: Family Medicine

## 2017-02-27 VITALS — BP 100/60 | HR 83 | Temp 98.1°F | Resp 16 | Ht 62.5 in | Wt 130.5 lb

## 2017-02-27 DIAGNOSIS — R059 Cough, unspecified: Secondary | ICD-10-CM

## 2017-02-27 DIAGNOSIS — R05 Cough: Secondary | ICD-10-CM

## 2017-02-27 DIAGNOSIS — J441 Chronic obstructive pulmonary disease with (acute) exacerbation: Secondary | ICD-10-CM

## 2017-02-27 MED ORDER — PREDNISONE 20 MG PO TABS: ORAL_TABLET | ORAL | 0 refills | Status: DC

## 2017-02-27 MED ORDER — AZITHROMYCIN 250 MG PO TABS: ORAL_TABLET | ORAL | 0 refills | Status: DC

## 2017-02-27 NOTE — Patient Instructions (Signed)
Go to the lab on the way out.  We'll contact you with your xray report. Start prednisone, with food.  If not better, then start the antibiotics.  Update us as needed.  Take care.  Glad to see you.

## 2017-02-27 NOTE — Progress Notes (Signed)
Pre-visit discussion using our clinic review tool. No additional management support is needed unless otherwise documented below in the visit note.  

## 2017-02-27 NOTE — Progress Notes (Signed)
Her hands were cold.  Rechecked pulse ox 93%.   She was recently seen here clinic.  She got some better after seeing Clark here on 02/11/17, but then sx worse in the last week.  More cough, aches, sputum- discolored.  No fevers.  More wheeze.  Needs SABA more often.    Recent seasonal changes with more pollen recently.   Meds, vitals, and allergies reviewed.   ROS: Per HPI unless specifically indicated in ROS section   GEN: nad, alert and oriented HEENT: mucous membranes moist, tm w/o erythema, nasal exam w/o erythema, clear discharge noted,  OP with cobblestoning NECK: supple w/o LA CV: rrr.   PULM: some occ wheeze but o/w ctab, no inc wob, speaking in complete sentences. EXT: no edema SKIN: no acute rash

## 2017-03-01 NOTE — Assessment & Plan Note (Signed)
Presumed exacerbation. Okay for outpatient follow-up. Start prednisone, with food.  If not better, then start the antibiotics.  Update us as needed.  See notes on chest x-ray. Patient agrees with plan.

## 2017-03-24 DIAGNOSIS — E113393 Type 2 diabetes mellitus with moderate nonproliferative diabetic retinopathy without macular edema, bilateral: Secondary | ICD-10-CM | POA: Diagnosis not present

## 2017-04-02 DIAGNOSIS — N183 Chronic kidney disease, stage 3 (moderate): Secondary | ICD-10-CM | POA: Diagnosis not present

## 2017-04-02 DIAGNOSIS — N140 Analgesic nephropathy: Secondary | ICD-10-CM | POA: Diagnosis not present

## 2017-10-06 ENCOUNTER — Other Ambulatory Visit: Payer: Self-pay | Admitting: Family Medicine

## 2017-10-14 DIAGNOSIS — H524 Presbyopia: Secondary | ICD-10-CM | POA: Diagnosis not present

## 2017-10-14 DIAGNOSIS — E113393 Type 2 diabetes mellitus with moderate nonproliferative diabetic retinopathy without macular edema, bilateral: Secondary | ICD-10-CM | POA: Diagnosis not present

## 2017-10-30 ENCOUNTER — Other Ambulatory Visit: Payer: Self-pay | Admitting: Family Medicine

## 2017-10-30 ENCOUNTER — Ambulatory Visit: Payer: Self-pay | Admitting: *Deleted

## 2017-10-30 MED ORDER — AZITHROMYCIN 250 MG PO TABS: ORAL_TABLET | ORAL | 0 refills | Status: DC

## 2017-10-30 MED ORDER — PREDNISONE 20 MG PO TABS: ORAL_TABLET | ORAL | 0 refills | Status: DC

## 2017-10-30 NOTE — Telephone Encounter (Signed)
Pt has appt on 11/02/17 at 11:30 with Harlin Heys Gessner FNP.

## 2017-10-30 NOTE — Progress Notes (Signed)
Called pt.  She is going to keep the OV as scheduled.   At this point, okay to start zmax and pred with routine cautions.   She agrees with plan.  Routine ER cautions given.  She agrees.

## 2017-10-30 NOTE — Telephone Encounter (Signed)
Patient is requesting and antibiotic and prednisone- she has COPD and is concerned about her cough. She has an appointment on Monday. Patient contact [phone]. Patient would like Rx [facility] to [facility].  Reason for Disposition . [1] Known COPD or other severe lung disease (i.e., bronchiectasis, cystic fibrosis, lung surgery) AND [2] worsening symptoms (i.e., increased sputum purulence or amount, increased breathing difficulty  Answer Assessment - Initial Assessment Questions 1. ONSET: "When did the cough begin?"      Got worse yesterday- has COPD and always has cough 2. SEVERITY: "How bad is the cough today?"      Having spells- activity and talking makes worse 3. RESPIRATORY DISTRESS: "Describe your breathing."      Breathing is harding with activity now 4. FEVER: "Do you have a fever?" If so, ask: "What is your temperature, how was it measured, and when did it start?"     no 5. SPUTUM: "Describe the color of your sputum" (clear, white, yellow, green)     Normal- thick and white- but now green 6. HEMOPTYSIS: "Are you coughing up any blood?" If so ask: "How much?" (flecks, streaks, tablespoons, etc.)     no 7. CARDIAC HISTORY: "Do you have any history of heart disease?" (e.g., heart attack, congestive heart failure)      no 8. LUNG HISTORY: "Do you have any history of lung disease?"  (e.g., pulmonary embolus, asthma, emphysema)     COPD 9. PE RISK FACTORS: "Do you have a history of blood clots?" (or: recent major surgery, recent prolonged travel, bedridden )     no 10. OTHER SYMPTOMS: "Do you have any other symptoms?" (e.g., runny nose, wheezing, chest pain)       Runny nose-watery drainage from nose 11. PREGNANCY: "Is there any chance you are pregnant?" "When was your last menstrual period?"       n/a 12. TRAVEL: "Have you traveled out of the country in the last month?" (e.g., travel history, exposures)       n/a  Protocols used: COUGH - ACUTE PRODUCTIVE-A-AH

## 2017-11-02 ENCOUNTER — Ambulatory Visit: Payer: Medicare HMO | Admitting: Family Medicine

## 2017-11-02 ENCOUNTER — Encounter: Payer: Self-pay | Admitting: Family Medicine

## 2017-11-02 VITALS — BP 122/66 | HR 77 | Temp 97.4°F | Wt 133.5 lb

## 2017-11-02 DIAGNOSIS — M25561 Pain in right knee: Secondary | ICD-10-CM

## 2017-11-02 DIAGNOSIS — J441 Chronic obstructive pulmonary disease with (acute) exacerbation: Secondary | ICD-10-CM | POA: Diagnosis not present

## 2017-11-02 DIAGNOSIS — M25562 Pain in left knee: Secondary | ICD-10-CM | POA: Diagnosis not present

## 2017-11-02 NOTE — Progress Notes (Signed)
Subjective:    Patient ID: Dominique Clark, female    DOB: 07/13/1941, 76 y.o.   MRN: [id number]  HPI This is a 76 yo female, patient of Dr. Para Marchuncan, who presents today with cough x 1 week. Dr. Para Marchuncan sent in azithromycin and prednisone three days ago and she reports that she feels improved, cough has decreased  today. More cough yesterday, large amount of green sputum. Using albuterol inhaler two times daily while sick. Taking symbicort and spiriva- doesn't feel like Spiriva is helping.   Requests refill of ultram- gets from mail order pharmacy. Take two tablets twice a day for bad knee pain.   Past Medical History:  Diagnosis Date  . COPD (chronic obstructive pulmonary disease) (HCC)   . Diabetes mellitus    type 2  . Diverticulosis    s/p colonoscopy  . Gastritis    via EGD  . Hiatal hernia    s/p dilation 2004  . Osteopenia   . Stress incontinence, female    Past Surgical History:  Procedure Laterality Date  . BLADDER REPAIR  9/04   bladder tack   . BREAST CYST EXCISION Right 1972  . TONSILLECTOMY     age 89  . TOTAL ABDOMINAL HYSTERECTOMY     Family History  Problem Relation Age of Onset  . Stroke Mother   . Breast cancer Neg Hx   . Colon cancer Neg Hx    Social History   Tobacco Use  . Smoking status: Former Smoker    Types: Cigarettes    Last attempt to quit: 12/29/1988    Years since quitting: 28.8  . Smokeless tobacco: Never Used  . Tobacco comment: 20 or more years   Substance Use Topics  . Alcohol use: No    Alcohol/week: 0.0 oz  . Drug use: No      Review of Systems Per HPI    Objective:   Physical Exam  Constitutional: She is oriented to person, place, and time. She appears well-developed and well-nourished. No distress.  HENT:  Head: Normocephalic and atraumatic.  Mouth/Throat: Oropharynx is clear and moist. No oropharyngeal exudate.  Eyes: Conjunctivae are normal.  Neck: Normal range of motion. Neck supple.  Cardiovascular: Normal rate,  regular rhythm and normal heart sounds.  Pulmonary/Chest: Effort normal and breath sounds normal. No respiratory distress. She has no wheezes. She has no rales.  Lymphadenopathy:    She has no cervical adenopathy.  Neurological: She is alert and oriented to person, place, and time.  Skin: Skin is warm and dry. She is not diaphoretic.  Psychiatric: She has a normal mood and affect. Her behavior is normal. Judgment and thought content normal.  Vitals reviewed.     BP 122/66 (BP Location: Left Arm, Patient Position: Sitting, Cuff Size: Normal)   Pulse 77   Temp (!) 97.4 F (36.3 C) (Oral)   Wt 133 lb 8 oz (60.6 kg)   SpO2 94%   BMI 24.03 kg/m  Wt Readings from Last 3 Encounters:  11/02/17 133 lb 8 oz (60.6 kg)  02/27/17 130 lb 8 oz (59.2 kg)  02/11/17 135 lb 12.8 oz (61.6 kg)       Assessment & Plan:  1. COPD with acute exacerbation (HCC) - improved with azithromycin and prednisone - will defer to Dr. Para Marchuncan with regards to Spiriva - RTC precautions reviewed with patient  2. Arthralgia of both knees - will route tramadol request to Dr. Karleen Dolphinuncan   Oscar Forman, FNP-BC  [place]  [facility]  11/02/2017 5:14 PM

## 2017-11-02 NOTE — Patient Instructions (Signed)
It was a pleasure to see you today  Dr. Para Marchuncan will send in a prescription for Ultram to your mail order pharmacy. He will review your inhalers and make a recommendation  Continue you prednisone and antibiotic until finished

## 2017-11-03 ENCOUNTER — Other Ambulatory Visit: Payer: Self-pay | Admitting: Family Medicine

## 2017-11-03 DIAGNOSIS — M1990 Unspecified osteoarthritis, unspecified site: Secondary | ICD-10-CM

## 2017-11-03 MED ORDER — TRAMADOL HCL 50 MG PO TABS: ORAL_TABLET | ORAL | 1 refills | Status: DC

## 2017-11-03 NOTE — Progress Notes (Signed)
Please fax in tramadol rx to mail order.  If she feels like the Spiriva is not helping then it would be okay to stop that medicine and update us as needed.  I would continue the other inhalers.  Thanks.   Faxed Rx to [facility].  Ninetta LightsL. [place], CMA  11/03/2017

## 2017-11-10 ENCOUNTER — Other Ambulatory Visit: Payer: Self-pay | Admitting: Family Medicine

## 2017-11-10 NOTE — Telephone Encounter (Signed)
Electronic refill request. Meloxicam Last office visit:   11/02/17 Leone PayorGessner Last Filled:   01/19/2017  ? Quantity Please advise.

## 2017-11-11 NOTE — Telephone Encounter (Signed)
Pt returned call, please call back [phone]

## 2017-11-11 NOTE — Telephone Encounter (Signed)
Left message on voicemail for patient to call back. 

## 2017-11-11 NOTE — Telephone Encounter (Signed)
Verify current dose.  I thought she was taking 7.5mg  a day.  Thanks.  Let me know and I can send in.

## 2017-11-11 NOTE — Telephone Encounter (Signed)
Spoke to patient and was advised that she has been cutting the 15 mg pill in 1/2 and is taking 7.5. Patient stated that she does need a 90 day supply on this.

## 2017-11-11 NOTE — Telephone Encounter (Signed)
Sent. Thanks.   

## 2017-11-27 ENCOUNTER — Emergency Department (HOSPITAL_COMMUNITY): Payer: Medicare HMO

## 2017-11-27 ENCOUNTER — Encounter: Payer: Self-pay | Admitting: Family Medicine

## 2017-11-27 ENCOUNTER — Other Ambulatory Visit: Payer: Self-pay

## 2017-11-27 ENCOUNTER — Encounter (HOSPITAL_COMMUNITY): Payer: Self-pay | Admitting: Pharmacy Technician

## 2017-11-27 ENCOUNTER — Ambulatory Visit: Payer: Medicare HMO | Admitting: Family Medicine

## 2017-11-27 ENCOUNTER — Observation Stay (HOSPITAL_COMMUNITY)
Admission: EM | Admit: 2017-11-27 | Discharge: 2017-11-28 | Disposition: A | Discharge status: Home / Self Care | Payer: Medicare HMO | Attending: Internal Medicine | Admitting: Internal Medicine

## 2017-11-27 DIAGNOSIS — I10 Essential (primary) hypertension: Secondary | ICD-10-CM | POA: Insufficient documentation

## 2017-11-27 DIAGNOSIS — I4891 Unspecified atrial fibrillation: Secondary | ICD-10-CM

## 2017-11-27 DIAGNOSIS — R531 Weakness: Secondary | ICD-10-CM | POA: Diagnosis not present

## 2017-11-27 DIAGNOSIS — R200 Anesthesia of skin: Secondary | ICD-10-CM | POA: Diagnosis not present

## 2017-11-27 DIAGNOSIS — J449 Chronic obstructive pulmonary disease, unspecified: Secondary | ICD-10-CM | POA: Diagnosis present

## 2017-11-27 DIAGNOSIS — G459 Transient cerebral ischemic attack, unspecified: Secondary | ICD-10-CM | POA: Diagnosis not present

## 2017-11-27 DIAGNOSIS — Z79899 Other long term (current) drug therapy: Secondary | ICD-10-CM | POA: Insufficient documentation

## 2017-11-27 DIAGNOSIS — J441 Chronic obstructive pulmonary disease with (acute) exacerbation: Secondary | ICD-10-CM | POA: Diagnosis not present

## 2017-11-27 DIAGNOSIS — R739 Hyperglycemia, unspecified: Secondary | ICD-10-CM | POA: Diagnosis not present

## 2017-11-27 DIAGNOSIS — E119 Type 2 diabetes mellitus without complications: Secondary | ICD-10-CM | POA: Insufficient documentation

## 2017-11-27 DIAGNOSIS — Z7901 Long term (current) use of anticoagulants: Secondary | ICD-10-CM | POA: Insufficient documentation

## 2017-11-27 DIAGNOSIS — Z87891 Personal history of nicotine dependence: Secondary | ICD-10-CM | POA: Diagnosis not present

## 2017-11-27 DIAGNOSIS — R0602 Shortness of breath: Secondary | ICD-10-CM | POA: Diagnosis not present

## 2017-11-27 DIAGNOSIS — R000 Tachycardia, unspecified: Secondary | ICD-10-CM | POA: Diagnosis not present

## 2017-11-27 LAB — HEPATIC FUNCTION PANEL
ALBUMIN: 3.6 g/dL (ref 3.5–5.0)
ALT: 12 U/L — ABNORMAL LOW (ref 14–54)
AST: 28 U/L (ref 15–41)
Alkaline Phosphatase: 60 U/L (ref 38–126)
Bilirubin, Direct: <0.1 mg/dL — ABNORMAL LOW (ref 0.1–0.5)
TOTAL PROTEIN: 6.8 g/dL (ref 6.5–8.1)
Total Bilirubin: 0.4 mg/dL (ref 0.3–1.2)

## 2017-11-27 LAB — BASIC METABOLIC PANEL
Anion gap: 9 (ref 5–15)
BUN: 18 mg/dL (ref 6–20)
CALCIUM: 8.9 mg/dL (ref 8.9–10.3)
CHLORIDE: 101 mmol/L (ref 101–111)
CO2: 25 mmol/L (ref 22–32)
Creatinine, Ser: 1.92 mg/dL — ABNORMAL HIGH (ref 0.44–1.00)
GFR, EST AFRICAN AMERICAN: 28 mL/min — AB (ref >60)
GFR, EST NON AFRICAN AMERICAN: 24 mL/min — AB (ref >60)
Glucose, Bld: 131 mg/dL — ABNORMAL HIGH (ref 65–99)
Potassium: 4.3 mmol/L (ref 3.5–5.1)
SODIUM: 135 mmol/L (ref 135–145)

## 2017-11-27 LAB — TROPONIN I: TROPONIN I: 0.08 ng/mL — AB (ref <0.03)

## 2017-11-27 LAB — CBC
HCT: 37.7 % (ref 36.0–46.0)
Hemoglobin: 11.6 g/dL — ABNORMAL LOW (ref 12.0–15.0)
MCH: 27.7 pg (ref 26.0–34.0)
MCHC: 30.8 g/dL (ref 30.0–36.0)
MCV: 90 fL (ref 78.0–100.0)
Platelets: 283 10*3/uL (ref 150–400)
RBC: 4.19 MIL/uL (ref 3.87–5.11)
RDW: 15.5 % (ref 11.5–15.5)
WBC: 5.6 10*3/uL (ref 4.0–10.5)

## 2017-11-27 LAB — TSH: TSH: 0.779 u[IU]/mL (ref 0.350–4.500)

## 2017-11-27 LAB — PROTIME-INR
INR: 1.04
PROTHROMBIN TIME: 13.5 s (ref 11.4–15.2)

## 2017-11-27 LAB — LIPASE, BLOOD: LIPASE: 35 U/L (ref 11–51)

## 2017-11-27 LAB — I-STAT TROPONIN, ED: TROPONIN I, POC: 0.06 ng/mL (ref 0.00–0.08)

## 2017-11-27 LAB — GLUCOSE, CAPILLARY: Glucose-Capillary: 198 mg/dL — ABNORMAL HIGH (ref 65–99)

## 2017-11-27 LAB — BRAIN NATRIURETIC PEPTIDE: B Natriuretic Peptide: 214.4 pg/mL — ABNORMAL HIGH (ref 0.0–100.0)

## 2017-11-27 MED ORDER — METOPROLOL TARTRATE 25 MG PO TABS
25.0000 mg | ORAL_TABLET | Freq: Once | ORAL | Status: AC
  Administered 2017-11-27: 25 mg via ORAL
  Filled 2017-11-27: qty 1

## 2017-11-27 MED ORDER — METOPROLOL SUCCINATE ER 25 MG PO TB24
25.0000 mg | ORAL_TABLET | Freq: Every day | ORAL | Status: DC
  Administered 2017-11-28: 25 mg via ORAL
  Filled 2017-11-27: qty 1

## 2017-11-27 MED ORDER — STROKE: EARLY STAGES OF RECOVERY BOOK
Freq: Once | Status: AC
  Administered 2017-11-27: 1

## 2017-11-27 MED ORDER — ASPIRIN 325 MG PO TABS
325.0000 mg | ORAL_TABLET | Freq: Every day | ORAL | Status: DC
  Administered 2017-11-27 – 2017-11-28 (×2): 325 mg via ORAL
  Filled 2017-11-27 (×2): qty 1

## 2017-11-27 MED ORDER — TRAMADOL HCL 50 MG PO TABS: 50.0000 mg | ORAL_TABLET | Freq: Four times a day (QID) | ORAL | Status: DC | PRN

## 2017-11-27 MED ORDER — ENSURE ENLIVE PO LIQD
237.0000 mL | Freq: Two times a day (BID) | ORAL | Status: DC
  Administered 2017-11-28 (×2): 237 mL via ORAL

## 2017-11-27 MED ORDER — ASPIRIN 300 MG RE SUPP
300.0000 mg | Freq: Every day | RECTAL | Status: DC
Start: 2017-11-27 — End: 2017-11-28

## 2017-11-27 MED ORDER — INSULIN ASPART 100 UNIT/ML ~~LOC~~ SOLN: 0.0000 [IU] | Freq: Every day | SUBCUTANEOUS | Status: DC

## 2017-11-27 MED ORDER — ATORVASTATIN CALCIUM 80 MG PO TABS: 80.0000 mg | ORAL_TABLET | Freq: Every day | ORAL | Status: DC

## 2017-11-27 MED ORDER — ACETAMINOPHEN 325 MG PO TABS: 650.0000 mg | ORAL_TABLET | ORAL | Status: DC | PRN

## 2017-11-27 MED ORDER — INSULIN ASPART 100 UNIT/ML ~~LOC~~ SOLN
0.0000 [IU] | Freq: Three times a day (TID) | SUBCUTANEOUS | Status: DC
Start: 2017-11-28 — End: 2017-11-28

## 2017-11-27 MED ORDER — SODIUM CHLORIDE 0.9 % IV SOLN
INTRAVENOUS | Status: AC
  Administered 2017-11-27: 75 mL/h via INTRAVENOUS

## 2017-11-27 MED ORDER — ASPIRIN 81 MG PO CHEW
324.0000 mg | CHEWABLE_TABLET | Freq: Once | ORAL | Status: AC
  Administered 2017-11-27: 324 mg via ORAL
  Filled 2017-11-27: qty 4

## 2017-11-27 MED ORDER — ACETAMINOPHEN 650 MG RE SUPP: 650.0000 mg | RECTAL | Status: DC | PRN

## 2017-11-27 MED ORDER — MOMETASONE FURO-FORMOTEROL FUM 200-5 MCG/ACT IN AERO
2.0000 | INHALATION_SPRAY | Freq: Two times a day (BID) | RESPIRATORY_TRACT | Status: DC
  Administered 2017-11-28: 2 via RESPIRATORY_TRACT
  Filled 2017-11-27: qty 8.8

## 2017-11-27 MED ORDER — FLUTICASONE PROPIONATE 50 MCG/ACT NA SUSP
2.0000 | Freq: Every day | NASAL | Status: DC
  Administered 2017-11-27 – 2017-11-28 (×2): 2 via NASAL
  Filled 2017-11-27: qty 16

## 2017-11-27 MED ORDER — ACETAMINOPHEN 160 MG/5ML PO SOLN: 650.0000 mg | ORAL | Status: DC | PRN

## 2017-11-27 NOTE — Progress Notes (Signed)
Patient received 25 mg of Lopressor in ED and had a dose of toprolol 25 mg scheduled to start tonight Neurologist on call to clarify if dose was to be given. Was asked to call Dr Selena BattenKim and medication is to start in am not tonight. Ilean SkillVeronica Brittni Hult LPN

## 2017-11-27 NOTE — ED Notes (Signed)
Admitting at bedside to assess pt

## 2017-11-27 NOTE — Consult Note (Signed)
Neurology Consultation  Reason for Consult: TIA Referring Physician: Dr. Clarice Pole  CC: Slurred speech and left-sided weakness  History is obtained from: Patient, chart  HPI: Dominique Clark is a 76 y.o. female past medical history of COPD, diabetes, who was in usual state of health until about 2 days ago when she started feeling not so well.  She felt really bad with generalized weakness and malaise all day yesterday.  She felt worn out and achy.  She said she almost felt like she was coming down with something like a flu.  She started feeling palpitations and chest discomfort for which she called her primary care and made an appointment to see the primary care this morning.  Upon arrival at the family care doctor's office, she was noted to be in atrial fibrillation with a heart rate in the 140s.  Per chart review she does not carry a diagnosis of atrial fibrillation and this is a new diagnosis. She also said that right before she went to her doctor, today she felt that her speech was a [place] slurred and left hand and arm were slightly numb and this lasted only for less than 5 minutes.  She does not report any headaches.  Denies visual symptoms.  Denies any facial weakness. Her family was at bedside and they confirmed that she is back to her baseline as she says. They do believe that she has mild asymmetry of her face with the left angle of the mouth being slightly lower than the right when asked if she looked abnormal when I was examining her.    LKW: More than 24 hours ago tpa given?: no, outside the window Premorbid modified Rankin scale (mRS):1   ROS: A 14 point ROS was performed and is negative except as noted in the HPI.   Past Medical History:  Diagnosis Date  . COPD (chronic obstructive pulmonary disease) (HCC)   . Diabetes mellitus    type 2  . Diverticulosis    s/p colonoscopy  . Gastritis    via EGD  . Hiatal hernia    s/p dilation 2004  . Osteopenia   . Stress  incontinence, female     Family History  Problem Relation Age of Onset  . Stroke Mother   . Breast cancer Neg Hx   . Colon cancer Neg Hx     Social History:   reports that she quit smoking about 28 years ago. Her smoking use included cigarettes. she has never used smokeless tobacco. She reports that she does not drink alcohol or use drugs.  Medications No current facility-administered medications for this encounter.   Current Outpatient Medications:  .  albuterol (PROVENTIL HFA;VENTOLIN HFA) 108 (90 Base) MCG/ACT inhaler, Inhale 1-2 puffs into the lungs every 4 (four) hours as needed for wheezing. Okay to fill with ventolin or proventil if needed., Disp: 3 Inhaler, Rfl: 3 .  budesonide-formoterol (SYMBICORT) 160-4.5 MCG/ACT inhaler, INHALE 2 PUFFS EVERY 12 HOURS TO PREVENTCOUGH OR WHEEZING--RINSE, GARGLE, & SPITAFTER EACH USE., Disp: 3 Inhaler, Rfl: 3 .  Esomeprazole Magnesium (NEXIUM PO), Take by mouth daily., Disp: , Rfl:  .  fluticasone (FLONASE) 50 MCG/ACT nasal spray, INHALE 2 SPRAYS INTO EACH NOSTRIL EVERY DAY., Disp: 48 g, Rfl: 3 .  meloxicam (MOBIC) 15 MG tablet, Take 0.5 tablets (7.5 mg total) daily by mouth., Disp: 45 tablet, Rfl: 1 .  metoprolol succinate (TOPROL-XL) 25 MG 24 hr tablet, TAKE 1 TABLET EVERY DAY NEED MD APPOINTMENT FOR REFILLS, Disp: 90  tablet, Rfl: 0 .  traMADol (ULTRAM) 50 MG tablet, TAKE 1 TO 2 TABLETS BY MOUTH EVERY 8 HOURS AS NEEDED FOR PAIN., Disp: 540 tablet, Rfl: 1   Exam: Current vital signs: BP 126/80   Pulse 88   Temp 97.9 F (36.6 C)   Resp (!) 22   Wt 58.1 kg (128 lb)   SpO2 98%   BMI 23.04 kg/m  Vital signs in last 24 hours: Temp:  [97.7 F (36.5 C)-97.9 F (36.6 C)] 97.9 F (36.6 C) (11/30 1834) Pulse Rate:  [66-152] 88 (11/30 1845) Resp:  [17-23] 22 (11/30 1845) BP: (102-139)/(58-114) 126/80 (11/30 1845) SpO2:  [92 %-100 %] 98 % (11/30 1845) Weight:  [58.1 kg (128 lb)-58.3 kg (128 lb 8 oz)] 58.1 kg (128 lb) (11/30  1621)  GENERAL: Awake, alert in NAD HEENT: - Normocephalic and atraumatic, dry mm, no LN++, no Thyromegally LUNGS -occasional wheezing, otherwise clear to auscultation, on 2 L oxygen via nasal cannula CV - S1S2 RRR, no m/r/g, equal pulses bilaterally. ABDOMEN - Soft, nontender, nondistended with normoactive BS Ext: warm, well perfused, intact peripheral pulses, no edema  NEURO:  Mental Status: AA&Ox3  Language: speech is non-dysarthric.  Naming, repetition, fluency, and comprehension intact. Cranial Nerves: PERRL. EOMI, visual fields full, subtle facial asymmetry with left angle of the mouth droop, facial sensation intact, hearing intact, tongue/uvula/soft palate midline, normal sternocleidomastoid and trapezius muscle strength. No evidence of tongue atrophy or fibrillations Motor: Symmetric 5/5 all over with no vertical drift. Tone: is normal and bulk is normal Sensation- Intact to light touch bilaterally Coordination: FTN intact bilaterally, no ataxia in BLE. Gait- deferred  NIHSS - 1 for facial   Labs I have reviewed labs in epic and the results pertinent to this consultation are: Anemia - hemoglobin 11.6 Creatinine 1.92 glucose 131 GFR 24 CBC    Component Value Date/Time   WBC 5.6 11/27/2017 1645   RBC 4.19 11/27/2017 1645   HGB 11.6 (L) 11/27/2017 1645   HCT 37.7 11/27/2017 1645   PLT 283 11/27/2017 1645   MCV 90.0 11/27/2017 1645   MCH 27.7 11/27/2017 1645   MCHC 30.8 11/27/2017 1645   RDW 15.5 11/27/2017 1645   LYMPHSABS 2.5 03/28/2015 1651   MONOABS 0.8 03/28/2015 1651   EOSABS 0.3 03/28/2015 1651   BASOSABS 0.0 03/28/2015 1651    CMP     Component Value Date/Time   NA 135 11/27/2017 1645   K 4.3 11/27/2017 1645   CL 101 11/27/2017 1645   CO2 25 11/27/2017 1645   GLUCOSE 131 (H) 11/27/2017 1645   BUN 18 11/27/2017 1645   CREATININE 1.92 (H) 11/27/2017 1645   CALCIUM 8.9 11/27/2017 1645   PROT 6.8 10/09/2016 1003   ALBUMIN 3.8 10/09/2016 1003   AST 22  10/09/2016 1003   ALT 11 10/09/2016 1003   ALKPHOS 52 10/09/2016 1003   BILITOT 0.4 10/09/2016 1003   GFRNONAA 24 (L) 11/27/2017 1645   GFRAA 28 (L) 11/27/2017 1645    Lipid Panel     Component Value Date/Time   CHOL 181 10/09/2016 1003   TRIG 94.0 10/09/2016 1003   HDL 55.60 10/09/2016 1003   CHOLHDL 3 10/09/2016 1003   VLDL 18.8 10/09/2016 1003   LDLCALC 106 (H) 10/09/2016 1003   LDLDIRECT 87.9 07/24/2014 0828   Imaging pending at the time of this dictation.  Assessment:  76 year old woman with new diagnosis of atrial fibrillation with RVR today who also had an episode of slurred speech, left-sided  numbness that resolved within a few minutes. Very concerning for a right hemispheric TIA of embolic etiology given new onset A. fib with RVR and patient not being on anticoagulation. Will recommend admission for TIA workup.  Impression: Right hemispheric TIA New onset atrial fibrillation with RVR  Recommendations: -Admit to hospitalist or observation -Telemetry monitoring -Allow for permissive hypertension for the first 24-48h - only treat PRN if SBP >220 mmHg. Blood pressures can be gradually normalized to SBP<140 upon discharge. -No head imaging available.  Obtain a CT head while in the ER. -MRI, MRA  of the brain without contrast -Echocardiogram -HgbA1c, fasting lipid panel -Frequent neuro checks -Prophylactic therapy-Antiplatelet med: Aspirin - dose 325mg  PO or 300mg  PR -Atorvastatin 80 mg PO daily -Risk factor modification -PT consult, OT consult, Speech consult  Please page stroke NP/PA/MD (listed on AMION)  from 8am-4 pm as this patient will be followed by the stroke team at this point.  -- Milon DikesAshish Rozetta Stumpp, MD [facility] [phone] If 7pm to 7am, please call on call as listed on AMION.

## 2017-11-27 NOTE — H&P (Signed)
TRH H&P   Patient Demographics:    Avionna Bower, is a 76 y.o. female  MRN: [id number]   DOB - 1941/01/31  Admit Date - 11/27/2017  Outpatient Primary MD for the patient is Tonia Ghent, MD  Referring MD/NP/PA: Charlesetta Shanks  Outpatient Specialists:   Patient coming from: home  Chief Complaint  Patient presents with  . Shortness of Breath  . Atrial Fibrillation      HPI:    Kionna Brier  is a 76 y.o. female, w Dm2, Copd, apparently presents to ED, c/o left arm numbness starting at about 1:30pm along with slurred speech.  Per ED notes symptoms lasted about 5 minutes. Pt thought that she might be having a stroke and presented to ED,   In ED,  EKG afib at 130, which self converted.   CXR IMPRESSION: 1. No acute intracranial abnormality identified. 2. Mild chronic microvascular ischemic changes and mild parenchymal volume loss of the brain.  CT brain  IMPRESSION: 1. No acute intracranial abnormality identified. 2. Mild chronic microvascular ischemic changes and mild parenchymal volume loss of the brain.   Na 135, K 4.3,   Glucose 131 Creatinine 1.92 Ast 28, Alk 12 BNP 214.4  Wbc 5.6, Hgb 11.6, Plt 283  Pt will be admitted for Afib with rvr,  ARF, and ? TIA   Review of systems:    In addition to the HPI above,  Pt 2weeks ago treated w Zpak and prednisone, felt weak and achy yesterday.   No Fever-chills, No Headache, No changes with Vision or hearing, No problems swallowing food or Liquids, No Chest pain, Cough or Shortness of Breath, No Abdominal pain, No Nausea or Vommitting, Bowel movements are regular, No Blood in stool or Urine, No dysuria, No new skin rashes or bruises, No new joints pains-aches,   No recent weight gain or loss, No polyuria, polydypsia or polyphagia, No significant Mental Stressors.  A full 10 point Review of Systems was  done, except as stated above, all other Review of Systems were negative.   With Past History of the following :    Past Medical History:  Diagnosis Date  . COPD (chronic obstructive pulmonary disease) ([place])   . Diabetes mellitus    type 2  . Diverticulosis    s/p colonoscopy  . Gastritis    via EGD  . Hiatal hernia    s/p dilation 2004  . Osteopenia   . Stress incontinence, female       Past Surgical History:  Procedure Laterality Date  . BLADDER REPAIR  9/04   bladder tack   . BREAST CYST EXCISION Right 1972  . TONSILLECTOMY     age 64  . TOTAL ABDOMINAL HYSTERECTOMY        Social History:     Social History   Tobacco Use  . Smoking status: Former Smoker  Types: Cigarettes    Last attempt to quit: 12/29/1988    Years since quitting: 28.9  . Smokeless tobacco: Never Used  . Tobacco comment: 20 or more years   Substance Use Topics  . Alcohol use: No    Alcohol/week: 0.0 oz     Lives - at home  Mobility - walks by self   Family History :     Family History  Problem Relation Age of Onset  . Stroke Mother   . Breast cancer Neg Hx   . Colon cancer Neg Hx       Home Medications:   Prior to Admission medications   Medication Sig Start Date End Date Taking? Authorizing Provider  albuterol (PROVENTIL HFA;VENTOLIN HFA) 108 (90 Base) MCG/ACT inhaler Inhale 1-2 puffs into the lungs every 4 (four) hours as needed for wheezing. Okay to fill with ventolin or proventil if needed. 10/16/16 12/13/17  Duncan, Graham S, MD  budesonide-formoterol (SYMBICORT) 160-4.5 MCG/ACT inhaler INHALE 2 PUFFS EVERY 12 HOURS TO PREVENTCOUGH OR WHEEZING--RINSE, GARGLE, & SPITAFTER EACH USE. 10/16/16   Duncan, Graham S, MD  Esomeprazole Magnesium (NEXIUM PO) Take by mouth daily.    [provider]  fluticasone (FLONASE) 50 MCG/ACT nasal spray INHALE 2 SPRAYS INTO EACH NOSTRIL EVERY DAY. 10/16/16   Duncan, Graham S, MD  meloxicam (MOBIC) 15 MG tablet Take 0.5 tablets (7.5  mg total) daily by mouth. 11/11/17   Duncan, Graham S, MD  metoprolol succinate (TOPROL-XL) 25 MG 24 hr tablet TAKE 1 TABLET EVERY DAY NEED MD APPOINTMENT FOR REFILLS 10/30/17   Duncan, Graham S, MD  traMADol (ULTRAM) 50 MG tablet TAKE 1 TO 2 TABLETS BY MOUTH EVERY 8 HOURS AS NEEDED FOR PAIN. 11/03/17   Duncan, Graham S, MD     Allergies:     Allergies  Allergen Reactions  . Doxycycline Nausea And Vomiting     Physical Exam:   Vitals  Blood pressure 126/80, pulse 88, temperature 97.9 F (36.6 C), resp. rate (!) 22, weight 58.1 kg (128 lb), SpO2 98 %.   1. General  lying in bed in NAD,   2. Normal affect and insight, Not Suicidal or Homicidal, Awake Alert, Oriented X 3.  3. No F.N deficits, ALL C.Nerves Intact, Strength 5/5 all 4 extremities, Sensation intact all 4 extremities, Plantars down going.  4. Ears and Eyes appear Normal, Conjunctivae clear, PERRLA. Moist Oral Mucosa.  5. Supple Neck, No JVD, No cervical lymphadenopathy appriciated, No Carotid Bruits.  6. Symmetrical Chest wall movement, Good air movement bilaterally, CTAB.  7. RRR, No Gallops, Rubs or Murmurs, No Parasternal Heave.  8. Positive Bowel Sounds, Abdomen Soft, No tenderness, No organomegaly appriciated,No rebound -guarding or rigidity.  9.  No Cyanosis, Normal Skin Turgor, No Skin Rash or Bruise.  10. Good muscle tone,  joints appear normal , no effusions, Normal ROM.  11. No Palpable Lymph Nodes in Neck or Axillae     Data Review:    CBC Recent Labs  Lab 11/27/17 1645  WBC 5.6  HGB 11.6*  HCT 37.7  PLT 283  MCV 90.0  MCH 27.7  MCHC 30.8  RDW 15.5   ------------------------------------------------------------------------------------------------------------------  Chemistries  Recent Labs  Lab 11/27/17 1645  NA 135  K 4.3  CL 101  CO2 25  GLUCOSE 131*  BUN 18  CREATININE 1.92*  CALCIUM 8.9  AST 28  ALT 12*  ALKPHOS 60  BILITOT 0.4    ------------------------------------------------------------------------------------------------------------------ CrCl cannot be calculated (Unknown ideal weight.). ------------------------------------------------------------------------------------------------------------------   No results for input(s): TSH, T4TOTAL, T3FREE, THYROIDAB in the last 72 hours.  Invalid input(s): FREET3  Coagulation profile Recent Labs  Lab 11/27/17 1645  INR 1.04   ------------------------------------------------------------------------------------------------------------------- No results for input(s): DDIMER in the last 72 hours. -------------------------------------------------------------------------------------------------------------------  Cardiac Enzymes No results for input(s): CKMB, TROPONINI, MYOGLOBIN in the last 168 hours.  Invalid input(s): CK ------------------------------------------------------------------------------------------------------------------    Component Value Date/Time   BNP 214.4 (H) 11/27/2017 1645     ---------------------------------------------------------------------------------------------------------------  Urinalysis    Component Value Date/Time   COLORURINE AMBER (A) 08/09/2012 1727   APPEARANCEUR CLEAR 08/09/2012 1727   LABSPEC 1.038 (H) 08/09/2012 1727   PHURINE 5.5 08/09/2012 1727   GLUCOSEU NEGATIVE 08/09/2012 1727   HGBUR NEGATIVE 08/09/2012 1727   HGBUR negative 10/11/2008 0926   BILIRUBINUR Neg 05/12/2015 1234   KETONESUR 15 (A) 08/09/2012 1727   PROTEINUR Positive 05/12/2015 1234   PROTEINUR 100 (A) 08/09/2012 1727   UROBILINOGEN 0.2 05/12/2015 1234   UROBILINOGEN 1.0 08/09/2012 1727   NITRITE Neg 05/12/2015 1234   NITRITE NEGATIVE 08/09/2012 1727   LEUKOCYTESUR moderate (2+) 05/12/2015 1234    ----------------------------------------------------------------------------------------------------------------   Imaging Results:    Ct  Head Wo Contrast  Result Date: 11/27/2017 CLINICAL DATA:  76 y/o  F; transient left hand numbness. EXAM: CT HEAD WITHOUT CONTRAST TECHNIQUE: Contiguous axial images were obtained from the base of the skull through the vertex without intravenous contrast. COMPARISON:  None. FINDINGS: Brain: No evidence of acute infarction, hemorrhage, hydrocephalus, extra-axial collection or mass lesion/mass effect. Few nonspecific foci of hypoattenuation in subcortical and periventricular white matter compatible with mild chronic microvascular ischemic changes. Mild brain parenchymal volume loss. Vascular: Calcific atherosclerosis of carotid siphons. No hyperdense vessel. Skull: Normal. Negative for fracture or focal lesion. Sinuses/Orbits: No acute finding. Other: None. IMPRESSION: 1. No acute intracranial abnormality identified. 2. Mild chronic microvascular ischemic changes and mild parenchymal volume loss of the brain. Electronically Signed   By: Lance  Furusawa-Stratton M.D.   On: 11/27/2017 19:16   Dg Chest Portable 1 View  Result Date: 11/27/2017 CLINICAL DATA:  Tachycardia and shortness of breath. EXAM: PORTABLE CHEST 1 VIEW COMPARISON:  02/27/2017. FINDINGS: The cardiac silhouette remains borderline enlarged. The lungs remain clear and hyperexpanded with mild diffuse peribronchial thickening and accentuation of the interstitial markings. A moderate-sized hiatal hernia is again demonstrated. Superior migration of the right humeral head with bony remodeling. IMPRESSION: 1. No acute abnormality. 2. Stable changes of COPD and chronic bronchitis. 3. Moderate-sized hiatal hernia. 4. Large, chronic right rotator cuff tear. Electronically Signed   By: Steven  Reid M.D.   On: 11/27/2017 16:58     Assessment & Plan:    Principal Problem:   TIA (transient ischemic attack) Active Problems:   COPD (chronic obstructive pulmonary disease) (HCC)   Hyperglycemia   Atrial fibrillation with RVR (HCC)    L arm numbness,  slurred speech TIA/ CVA MRI / MRA brain  Carotid ultrasound Cardiac echo Check hga1c, lipid Aspirin, lipitor  Afib with RVR Tele Trop I q6h x3 TSH Check cardiac echo.  Cont Toprol XL  Hyperglycemia/ Dm2 Check hga1c,  fsbs ac and qhs, ISS    DVT Prophylaxis  SCDs  AM Labs Ordered, also please review Full Orders  Family Communication: Admission, patients condition and plan of care including tests being ordered have been discussed with the patient  who indicate understanding and agree with the plan and Code Status.  Code Status FULL CODE  Likely DC to  home    Condition GUARDED    Consults called: neurology by ED  Admission status: inpatient  Time spent in minutes : 45  Jani Gravel M.D on 11/27/2017 at 7:51 PM  Between 7am to 7pm - Pager - [phone]  . After 7pm go to [email or web address] - password [facility]  [facility] - Office  [phone]

## 2017-11-27 NOTE — ED Triage Notes (Signed)
Pt arrives via [facility] from PCP office with reports of tachycardia and SOB. Pt with recent URI being tx with Zpak with some improvement. Pt went to PCP for reevaluation of SOB and pt was 91% RA and HR 140's at MD office. EMS gave 300cc NS, 5mg  albuterol, 125mg  solumedrol, 0.5 atrovent en route. Pt remains afib RVR, HR 140-150, 100% 8L aerosol mask, BP 140/92, RR20.

## 2017-11-27 NOTE — ED Notes (Signed)
PT transported to CT>

## 2017-11-27 NOTE — Assessment & Plan Note (Addendum)
Presumed, likely with concurrent AF which is a new dx, d/w pt.  EMS en route, will need ER eval.  I appreciate the help of all involved.   D/w pt about possible/likely TIA event with the hand tingling.  Needs inpatient eval.  All d/w pt.  Patient to ER.  >25 minutes spent in face to face time with patient, >50% spent in counselling or coordination of care.

## 2017-11-27 NOTE — Progress Notes (Signed)
Coughing.  Muscle cramping recently in the abd wall.  No fevers.  More sputum recently, thick and white.   She hasn't felt well in general.   She had transient L hand numbness earlier today ~1:30. Lasted a few minutes and resolved.  No other focal neuro sx in the meantime per patient report.    PMH and SH reviewed  ROS: Per HPI unless specifically indicated in ROS section   Meds, vitals, and allergies reviewed.   nad ncat speaking in complete sentences.  Neck supple, no LA Notably tachy, IRR pulse >120s.   Diffuse exp wheeze w/o focal dec in BS Ext w/o edema.  S/S grossly wnl x4, speech at baseline, no facial weakness noted.   Placed on O2 2L and EMS already called.  Son and patient aware of pending EMS transfer.

## 2017-11-27 NOTE — ED Provider Notes (Signed)
[facility] [facility] PROGRESSIVE CARE Provider Note   CSN: [id number] Arrival date & time: 11/27/17  1609     History   Chief Complaint Chief Complaint  Patient presents with  . Shortness of Breath  . Atrial Fibrillation    HPI Dominique Clark is a 76 y.o. female.  HPI Patient reports she felt terrible all day yesterday.  She reports she just felt weak and worn out and achy.  She reports she felt like she might be catching a flu.  No documented fever.  Patient does endorse some waxing and waning chest discomfort yesterday.  Patient does have COPD and has persistent cough but reports it has been worse for the past couple weeks.  She was treated 2 weeks ago with a Z-Pak and prednisone, she reports the symptoms were improving but she does not feel that she ever really completely got over it.  She went to her primary care doctor's office today for evaluation of her shortness of breath and was found to have heart rates in the 140s with atrial fibrillation.  Patient's room air's oxygen saturation was 91%.  She was transported by EMS to the emergency department.  On route she was treated with Solu-Medrol and Atrovent.  Patient does not have any known history of atrial fibrillation.  After discussing the full history of present illness and her medical history, patient added that she thought she might of had a "mini stroke" today.  She reports she has before coming to her doctor at 1:30pm her speech got slurred and her left hand was numb.  The symptoms had resolved in under 5 minutes.  Reports there are no longer present and it did not recur since that time.  Patient has no known history of atrial fibrillation or CVA. Past Medical History:  Diagnosis Date  . COPD (chronic obstructive pulmonary disease) (HCC)   . Diabetes mellitus    type 2  . Diverticulosis    s/p colonoscopy  . Gastritis    via EGD  . Hiatal hernia    s/p dilation 2004  . Osteopenia   . Stress incontinence, female     Patient  Active Problem List   Diagnosis Date Noted  . TIA (transient ischemic attack) 11/27/2017  . Atrial fibrillation with RVR (HCC) 11/27/2017  . Elevated serum creatinine 10/16/2016  . Healthcare maintenance 10/16/2016  . Essential hypertension 10/16/2016  . Medicare annual wellness visit, initial 10/08/2015  . Advance care planning 10/08/2015  . Knee pain 02/23/2015  . Stricture and stenosis of esophagus 02/20/2014  . Special screening for malignant neoplasms, colon 01/30/2014  . COPD with acute exacerbation (HCC) 08/09/2012  . Hiatal hernia 04/20/2012  . Dysphagia 03/09/2012  . Vertigo, benign paroxysmal 05/02/2011  . Anemia 04/17/2011  . Hyperglycemia 02/14/2011  . INCONTINENCE, FEMALE STRESS 10/11/2008  . GASTRITIS 05/30/2008  . ESOPHAGEAL REFLUX 02/24/2008  . Osteopenia 09/29/2007  . NECK AND BACK PAIN 07/26/2007  . COPD (chronic obstructive pulmonary disease) (HCC) 03/29/2007  . DIVERTICULOSIS, COLON W/O HEM 03/29/2007    Past Surgical History:  Procedure Laterality Date  . BLADDER REPAIR  9/04   bladder tack   . BREAST CYST EXCISION Right 1972  . TONSILLECTOMY     age 61  . TOTAL ABDOMINAL HYSTERECTOMY      OB History    No data available       Home Medications    Prior to Admission medications   Medication Sig Start Date End Date Taking? Authorizing Provider  albuterol (PROVENTIL HFA;VENTOLIN HFA) 108 (90 Base) MCG/ACT inhaler Inhale 1-2 puffs into the lungs every 4 (four) hours as needed for wheezing. Okay to fill with ventolin or proventil if needed. 10/16/16 12/13/17 Yes [name], MD  budesonide-formoterol (SYMBICORT) 160-4.5 MCG/ACT inhaler INHALE 2 PUFFS EVERY 12 HOURS TO PREVENTCOUGH OR WHEEZING--RINSE, GARGLE, & SPITAFTER EACH USE. Patient taking differently: Inhale 2 puffs into the lungs every 12 (twelve) hours. TO PREVENT COUGH OR WHEEZING--RINSE, GARGLE, & SPIT AFTER EACH USE. 10/16/16  Yes [name], MD  Esomeprazole Magnesium (NEXIUM  PO) Take 1 capsule by mouth daily.    Yes [provider]  fluticasone (FLONASE) 50 MCG/ACT nasal spray INHALE 2 SPRAYS INTO EACH NOSTRIL EVERY DAY. Patient taking differently: Place 2 sprays into both nostrils daily as needed (seasonal allergies).  10/16/16  Yes [name], MD  meloxicam [facility]) 15 MG tablet Take 0.5 tablets (7.5 mg total) daily by mouth. 11/11/17  Yes [name], MD  tiotropium (SPIRIVA) 18 MCG inhalation capsule Place 18 mcg into inhaler and inhale daily.   Yes [provider]  traMADol (ULTRAM) 50 MG tablet TAKE 1 TO 2 TABLETS BY MOUTH EVERY 8 HOURS AS NEEDED FOR PAIN. Patient taking differently: Take 50-100 mg by mouth See admin instructions. Take 2 tablets (100 mg) by mouth every morning and take 1 or 2 tablets (50-100 mg) at night - for arthritis pain 11/03/17  Yes [name], MD  vitamin E 1000 UNIT capsule Take 1,000 Units by mouth daily.   Yes [provider]  apixaban (ELIQUIS) 2.5 MG TABS tablet Take 1 tablet (2.5 mg total) by mouth 2 (two) times daily. 11/28/17   Osvaldo Shipper, MD  atorvastatin (LIPITOR) 80 MG tablet Take 1 tablet (80 mg total) by mouth daily at 6 PM. 11/28/17   Osvaldo Shipper, MD  metoprolol succinate (TOPROL-XL) 25 MG 24 hr tablet Take 1 tablet (25 mg total) by mouth daily. TAKE 1 TABLET EVERY DAY NEED MD APPOINTMENT FOR REFILLS 11/28/17   Osvaldo Shipper, MD    Family History Family History  Problem Relation Age of Onset  . Stroke Mother   . Breast cancer Neg Hx   . Colon cancer Neg Hx     Social History Social History   Tobacco Use  . Smoking status: Former Smoker    Types: Cigarettes    Last attempt to quit: 12/29/1988    Years since quitting: 28.9  . Smokeless tobacco: Never Used  . Tobacco comment: 20 or more years   Substance Use Topics  . Alcohol use: No    Alcohol/week: 0.0 oz  . Drug use: No     Allergies   Doxycycline   Review of Systems Review of Systems 10 Systems  reviewed and are negative for acute change except as noted in the HPI.   Physical Exam Updated Vital Signs BP (!) 131/55 (BP Location: Left Arm)   Pulse 77   Temp 98 F (36.7 C) (Oral)   Resp 18   Ht 5\' 2"  (1.575 m)   Wt 58.1 kg (128 lb)   SpO2 95%   BMI 23.41 kg/m   Physical Exam  Constitutional: She is oriented to person, place, and time. She appears well-developed and well-nourished. No distress.  Patient is alert and well appearing.  No distress.  Mental status is clear.  HENT:  Head: Normocephalic and atraumatic.  Nose: Nose normal.  Mouth/Throat: Oropharynx is clear and moist.  Eyes: Conjunctivae and EOM  are normal. Pupils are equal, round, and reactive to light.  Neck: Neck supple.  Cardiovascular: Normal rate and regular rhythm.  No murmur heard. Heart rate is borderline tachycardic in the high 90s.  Regular with occasional ectopy.  No gross rub murmur gallop.  Pulmonary/Chest: Effort normal and breath sounds normal. No respiratory distress.  No respiratory distress.  Occasional wet cough.  Wheeze left lung base.  Abdominal: Soft. She exhibits no distension. There is no tenderness. There is no guarding.  Musculoskeletal: Normal range of motion. She exhibits no edema or tenderness.  No peripheral edema no calf tenderness.  Neurological: She is alert and oriented to person, place, and time. No cranial nerve deficit or sensory deficit. She exhibits normal muscle tone. Coordination normal.  At this time, patient's mental status is clear.  No aphasia.  Speech is clear.  No cranial nerve deficit.  Upper and lower extremity strength is 5\5.  Patient has minor limitations to testing of the right upper extremity due to chronic shoulder problems.  She does not endorse any subjective sensory difference to light touch between left and right.  Skin: Skin is warm and dry.  Psychiatric: She has a normal mood and affect.  Nursing note and vitals reviewed.    ED Treatments / Results    Labs (all labs ordered are listed, but only abnormal results are displayed) Labs Reviewed  BASIC METABOLIC PANEL - Abnormal; Notable for the following components:      Result Value   Glucose, Bld 131 (*)    Creatinine, Ser 1.92 (*)    GFR calc non Af Amer 24 (*)    GFR calc Af Amer 28 (*)    All other components within normal limits  CBC - Abnormal; Notable for the following components:   Hemoglobin 11.6 (*)    All other components within normal limits  HEPATIC FUNCTION PANEL - Abnormal; Notable for the following components:   ALT 12 (*)    Bilirubin, Direct <0.1 (*)    All other components within normal limits  BRAIN NATRIURETIC PEPTIDE - Abnormal; Notable for the following components:   B Natriuretic Peptide 214.4 (*)    All other components within normal limits  URINALYSIS, ROUTINE W REFLEX MICROSCOPIC - Abnormal; Notable for the following components:   APPearance HAZY (*)    Protein, ur 30 (*)    Squamous Epithelial / LPF 0-5 (*)    All other components within normal limits  HEMOGLOBIN A1C - Abnormal; Notable for the following components:   Hgb A1c MFr Bld 6.2 (*)    All other components within normal limits  TROPONIN I - Abnormal; Notable for the following components:   Troponin I 0.08 (*)    All other components within normal limits  TROPONIN I - Abnormal; Notable for the following components:   Troponin I 0.06 (*)    All other components within normal limits  TROPONIN I - Abnormal; Notable for the following components:   Troponin I 0.05 (*)    All other components within normal limits  GLUCOSE, CAPILLARY - Abnormal; Notable for the following components:   Glucose-Capillary 198 (*)    All other components within normal limits  GLUCOSE, CAPILLARY - Abnormal; Notable for the following components:   Glucose-Capillary 126 (*)    All other components within normal limits  BASIC METABOLIC PANEL - Abnormal; Notable for the following components:   Sodium 134 (*)     Glucose, Bld 147 (*)    BUN 27 (*)  Creatinine, Ser 1.66 (*)    Calcium 8.5 (*)    GFR calc non Af Amer 29 (*)    GFR calc Af Amer 33 (*)    All other components within normal limits  GLUCOSE, CAPILLARY - Abnormal; Notable for the following components:   Glucose-Capillary 100 (*)    All other components within normal limits  LIPASE, BLOOD  PROTIME-INR  LIPID PANEL  TSH  I-STAT TROPONIN, ED    EKG  EKG Interpretation  Date/Time:  Friday November 27 2017 17:24:38 EST Ventricular Rate:  98 PR Interval:    QRS Duration: 88 QT Interval:  367 QTC Calculation: 469 R Axis:   82 Text Interpretation:  Sinus tachycardia Atrial premature complexes Consider right ventricular hypertrophy agree. conversion compared to previous Confirmed by Arby Barrette [phone]) on 11/27/2017 5:35:31 PM       Radiology Ct Head Wo Contrast  Result Date: 11/27/2017 CLINICAL DATA:  76 y/o  F; transient left hand numbness. EXAM: CT HEAD WITHOUT CONTRAST TECHNIQUE: Contiguous axial images were obtained from the base of the skull through the vertex without intravenous contrast. COMPARISON:  None. FINDINGS: Brain: No evidence of acute infarction, hemorrhage, hydrocephalus, extra-axial collection or mass lesion/mass effect. Few nonspecific foci of hypoattenuation in subcortical and periventricular white matter compatible with mild chronic microvascular ischemic changes. Mild brain parenchymal volume loss. Vascular: Calcific atherosclerosis of carotid siphons. No hyperdense vessel. Skull: Normal. Negative for fracture or focal lesion. Sinuses/Orbits: No acute finding. Other: None. IMPRESSION: 1. No acute intracranial abnormality identified. 2. Mild chronic microvascular ischemic changes and mild parenchymal volume loss of the brain. Electronically Signed   By: Mitzi Hansen M.D.   On: 11/27/2017 19:16   Mr Brain Wo Contrast  Result Date: 11/28/2017 CLINICAL DATA:  Initial evaluation for left arm  numbness with slurred speech. EXAM: MRI HEAD WITHOUT CONTRAST MRA HEAD WITHOUT CONTRAST TECHNIQUE: Multiplanar, multiecho pulse sequences of the brain and surrounding structures were obtained without intravenous contrast. Angiographic images of the head were obtained using MRA technique without contrast. COMPARISON:  Prior CT from 11/27/2017. FINDINGS: MRI HEAD FINDINGS Brain: Diffuse prominence of the CSF containing spaces compatible with generalized cerebral atrophy. Patchy and confluent T2/FLAIR hyperintensity within the periventricular and deep white matter both cerebral hemispheres most compatible chronic small vessel ischemic disease, mild in nature. No abnormal foci of restricted diffusion to suggest acute or subacute ischemia. Gray-white matter differentiation maintained. No encephalomalacia to suggest chronic infarction. No abnormal foci of restricted diffusion to suggest acute or chronic intracranial hemorrhage. No mass lesion, midline shift or mass effect. No hydrocephalus. No extra-axial fluid collection. Major dural sinuses are grossly patent. Pituitary gland suprasellar region normal. Midline structures intact and normal. Vascular: Major intracranial vascular flow voids are maintained. Skull and upper cervical spine: Craniocervical junction normal. Multilevel degenerate spondylolysis noted within the upper cervical spine without significant stenosis. Bone marrow signal intensity within normal limits. No scalp soft tissue abnormality. Sinuses/Orbits: Globes and orbital soft tissues within normal limits. Paranasal sinuses are largely clear. Small left mastoid effusion noted, of doubtful significance. Inner ear structures normal. Other: None. MRA HEAD FINDINGS ANTERIOR CIRCULATION: Distal cervical segments of the internal carotid arteries are widely patent with antegrade flow. Petrous, cavernous, and supraclinoid segments patent without flow-limiting stenosis. A1 segments patent bilaterally. Left A1  segment hypoplastic, which likely accounts for the diminutive left ICA is compared to the right. Normal anterior communicating artery. Anterior cerebral artery is widely patent to their distal aspects. M1 segments patent without stenosis  or occlusion. Normal MCA bifurcations. No proximal M2 occlusion. Distal MCA branches well perfused and symmetric. POSTERIOR CIRCULATION: Vertebral arteries widely patent to the vertebrobasilar junction. Left vertebral artery dominant. Posterior inferior cerebral arteries patent bilaterally. Basilar artery widely patent to its distal aspect. Superior cerebral arteries patent bilaterally. Both of the posterior cerebral artery is supplied via the basilar and are widely patent to their distal aspects. No aneurysm. IMPRESSION: MRI HEAD IMPRESSION: 1. No acute intracranial abnormality. 2. Age related cerebral atrophy with mild chronic small vessel ischemic disease. MRA HEAD IMPRESSION: Normal intracranial MRA. Electronically Signed   By: Rise MuBenjamin  McClintock M.D.   On: 11/28/2017 03:18   Dg Chest Portable 1 View  Result Date: 11/27/2017 CLINICAL DATA:  Tachycardia and shortness of breath. EXAM: PORTABLE CHEST 1 VIEW COMPARISON:  02/27/2017. FINDINGS: The cardiac silhouette remains borderline enlarged. The lungs remain clear and hyperexpanded with mild diffuse peribronchial thickening and accentuation of the interstitial markings. A moderate-sized hiatal hernia is again demonstrated. Superior migration of the right humeral head with bony remodeling. IMPRESSION: 1. No acute abnormality. 2. Stable changes of COPD and chronic bronchitis. 3. Moderate-sized hiatal hernia. 4. Large, chronic right rotator cuff tear. Electronically Signed   By: Beckie SaltsSteven  Reid M.D.   On: 11/27/2017 16:58   Mr Dominique GlennMra Head Wo Contrast  Result Date: 11/28/2017 CLINICAL DATA:  Initial evaluation for left arm numbness with slurred speech. EXAM: MRI HEAD WITHOUT CONTRAST MRA HEAD WITHOUT CONTRAST TECHNIQUE:  Multiplanar, multiecho pulse sequences of the brain and surrounding structures were obtained without intravenous contrast. Angiographic images of the head were obtained using MRA technique without contrast. COMPARISON:  Prior CT from 11/27/2017. FINDINGS: MRI HEAD FINDINGS Brain: Diffuse prominence of the CSF containing spaces compatible with generalized cerebral atrophy. Patchy and confluent T2/FLAIR hyperintensity within the periventricular and deep white matter both cerebral hemispheres most compatible chronic small vessel ischemic disease, mild in nature. No abnormal foci of restricted diffusion to suggest acute or subacute ischemia. Gray-white matter differentiation maintained. No encephalomalacia to suggest chronic infarction. No abnormal foci of restricted diffusion to suggest acute or chronic intracranial hemorrhage. No mass lesion, midline shift or mass effect. No hydrocephalus. No extra-axial fluid collection. Major dural sinuses are grossly patent. Pituitary gland suprasellar region normal. Midline structures intact and normal. Vascular: Major intracranial vascular flow voids are maintained. Skull and upper cervical spine: Craniocervical junction normal. Multilevel degenerate spondylolysis noted within the upper cervical spine without significant stenosis. Bone marrow signal intensity within normal limits. No scalp soft tissue abnormality. Sinuses/Orbits: Globes and orbital soft tissues within normal limits. Paranasal sinuses are largely clear. Small left mastoid effusion noted, of doubtful significance. Inner ear structures normal. Other: None. MRA HEAD FINDINGS ANTERIOR CIRCULATION: Distal cervical segments of the internal carotid arteries are widely patent with antegrade flow. Petrous, cavernous, and supraclinoid segments patent without flow-limiting stenosis. A1 segments patent bilaterally. Left A1 segment hypoplastic, which likely accounts for the diminutive left ICA is compared to the right. Normal  anterior communicating artery. Anterior cerebral artery is widely patent to their distal aspects. M1 segments patent without stenosis or occlusion. Normal MCA bifurcations. No proximal M2 occlusion. Distal MCA branches well perfused and symmetric. POSTERIOR CIRCULATION: Vertebral arteries widely patent to the vertebrobasilar junction. Left vertebral artery dominant. Posterior inferior cerebral arteries patent bilaterally. Basilar artery widely patent to its distal aspect. Superior cerebral arteries patent bilaterally. Both of the posterior cerebral artery is supplied via the basilar and are widely patent to their distal aspects. No aneurysm. IMPRESSION: MRI  HEAD IMPRESSION: 1. No acute intracranial abnormality. 2. Age related cerebral atrophy with mild chronic small vessel ischemic disease. MRA HEAD IMPRESSION: Normal intracranial MRA. Electronically Signed   By: Rise Mu M.D.   On: 11/28/2017 03:18    Procedures Procedures (including critical care time)  Medications Ordered in ED Medications  0.9 %  sodium chloride infusion ( Intravenous Stopped 11/28/17 0950)  metoprolol tartrate (LOPRESSOR) tablet 25 mg (25 mg Oral Given 11/27/17 1749)  aspirin chewable tablet 324 mg (324 mg Oral Given 11/27/17 1802)   stroke: mapping our early stages of recovery book (1 each Does not apply Given 11/27/17 2301)     Initial Impression / Assessment and Plan / ED Course  I have reviewed the triage vital signs and the nursing notes.  Pertinent labs & imaging results that were available during my care of the patient were reviewed by me and considered in my medical decision making (see chart for details).      Final Clinical Impressions(s) / ED Diagnoses   Final diagnoses:  TIA (transient ischemic attack)  New onset atrial fibrillation [facility])   Patient prevents from outpatient evaluation with atrial fibrillation new onset.  Patient made note of episode that is suspicious for TIA.  She has slurred  speech and left hand numbness.  And will be for admission for new onset A. fib and rule out CVA/TIA.    Arby Barrette, MD 11/29/17 [phone]

## 2017-11-27 NOTE — ED Notes (Signed)
Main lab to add on Lipase, BNP, Hepatic function, and Protime

## 2017-11-27 NOTE — ED Notes (Signed)
Patient transported to CT 

## 2017-11-28 ENCOUNTER — Other Ambulatory Visit: Payer: Self-pay

## 2017-11-28 ENCOUNTER — Inpatient Hospital Stay (HOSPITAL_BASED_OUTPATIENT_CLINIC_OR_DEPARTMENT_OTHER): Payer: Medicare HMO

## 2017-11-28 ENCOUNTER — Inpatient Hospital Stay (HOSPITAL_COMMUNITY): Payer: Medicare HMO

## 2017-11-28 DIAGNOSIS — J441 Chronic obstructive pulmonary disease with (acute) exacerbation: Secondary | ICD-10-CM | POA: Diagnosis not present

## 2017-11-28 DIAGNOSIS — G459 Transient cerebral ischemic attack, unspecified: Secondary | ICD-10-CM

## 2017-11-28 DIAGNOSIS — I361 Nonrheumatic tricuspid (valve) insufficiency: Secondary | ICD-10-CM | POA: Diagnosis not present

## 2017-11-28 DIAGNOSIS — R739 Hyperglycemia, unspecified: Secondary | ICD-10-CM

## 2017-11-28 DIAGNOSIS — I4891 Unspecified atrial fibrillation: Secondary | ICD-10-CM

## 2017-11-28 DIAGNOSIS — I340 Nonrheumatic mitral (valve) insufficiency: Secondary | ICD-10-CM | POA: Diagnosis not present

## 2017-11-28 DIAGNOSIS — R4781 Slurred speech: Secondary | ICD-10-CM | POA: Diagnosis not present

## 2017-11-28 LAB — LIPID PANEL
CHOL/HDL RATIO: 3.1 ratio
CHOLESTEROL: 144 mg/dL (ref 0–200)
HDL: 46 mg/dL (ref >40)
LDL Cholesterol: 83 mg/dL (ref 0–99)
Triglycerides: 75 mg/dL (ref <150)
VLDL: 15 mg/dL (ref 0–40)

## 2017-11-28 LAB — URINALYSIS, ROUTINE W REFLEX MICROSCOPIC
Bacteria, UA: NONE SEEN
Bilirubin Urine: NEGATIVE
Glucose, UA: NEGATIVE mg/dL
Hgb urine dipstick: NEGATIVE
Ketones, ur: NEGATIVE mg/dL
Leukocytes, UA: NEGATIVE
Nitrite: NEGATIVE
Protein, ur: 30 mg/dL — AB
Specific Gravity, Urine: 1.02 (ref 1.005–1.030)
pH: 5 (ref 5.0–8.0)

## 2017-11-28 LAB — BASIC METABOLIC PANEL
Anion gap: 9 (ref 5–15)
BUN: 27 mg/dL — AB (ref 6–20)
CHLORIDE: 102 mmol/L (ref 101–111)
CO2: 23 mmol/L (ref 22–32)
CREATININE: 1.66 mg/dL — AB (ref 0.44–1.00)
Calcium: 8.5 mg/dL — ABNORMAL LOW (ref 8.9–10.3)
GFR, EST AFRICAN AMERICAN: 33 mL/min — AB (ref >60)
GFR, EST NON AFRICAN AMERICAN: 29 mL/min — AB (ref >60)
Glucose, Bld: 147 mg/dL — ABNORMAL HIGH (ref 65–99)
Potassium: 4.5 mmol/L (ref 3.5–5.1)
SODIUM: 134 mmol/L — AB (ref 135–145)

## 2017-11-28 LAB — HEMOGLOBIN A1C
Hgb A1c MFr Bld: 6.2 % — ABNORMAL HIGH (ref 4.8–5.6)
Mean Plasma Glucose: 131.24 mg/dL

## 2017-11-28 LAB — ECHOCARDIOGRAM COMPLETE: WEIGHTICAEL: 2048 [oz_av]

## 2017-11-28 LAB — GLUCOSE, CAPILLARY
GLUCOSE-CAPILLARY: 126 mg/dL — AB (ref 65–99)
GLUCOSE-CAPILLARY: 100 mg/dL — AB (ref 65–99)

## 2017-11-28 LAB — TROPONIN I
TROPONIN I: 0.05 ng/mL — AB (ref <0.03)
Troponin I: 0.06 ng/mL

## 2017-11-28 MED ORDER — METOPROLOL SUCCINATE ER 25 MG PO TB24: 25.0000 mg | ORAL_TABLET | Freq: Every day | ORAL | 0 refills | Status: DC

## 2017-11-28 MED ORDER — ATORVASTATIN CALCIUM 80 MG PO TABS: 80.0000 mg | ORAL_TABLET | Freq: Every day | ORAL | 0 refills | Status: DC

## 2017-11-28 MED ORDER — APIXABAN 2.5 MG PO TABS: 2.5000 mg | ORAL_TABLET | Freq: Two times a day (BID) | ORAL | 1 refills | Status: DC

## 2017-11-28 MED ORDER — APIXABAN 2.5 MG PO TABS
2.5000 mg | ORAL_TABLET | Freq: Two times a day (BID) | ORAL | Status: DC
  Administered 2017-11-28: 2.5 mg via ORAL
  Filled 2017-11-28: qty 1

## 2017-11-28 NOTE — Care Management Obs Status (Signed)
MEDICARE OBSERVATION STATUS NOTIFICATION   Patient Details  Name: Dominique Clark MRN: [id number] Date of Birth: 08/03/1941   Medicare Observation Status Notification Given:  Yes    Lawerance SabalDebbie Fujiko Picazo, RN 11/28/2017, 2:37 PM

## 2017-11-28 NOTE — Evaluation (Signed)
Occupational Therapy Evaluation Patient Details Name: Dominique Clark MRN: [id number] DOB: 05/18/1941 Today's Date: 11/28/2017    History of Present Illness 76 y.o. female, w Dm2, Copd, apparently presents to ED, c/o left arm numbness starting at about 1:30pm along with slurred speech. Neuro work up underway, MRI negative.   Clinical Impression   Patient evaluated by Occupational Therapy with no further acute OT needs identified. All education has been completed and the patient has no further questions. Pt requires supervision with ADLs.  Reviewed energy conservation techniques.  Education completed. See below for any follow-up Occupational Therapy or equipment needs. OT is signing off. Thank you for this referral.      Follow Up Recommendations  No OT follow up;Supervision/Assistance - 24 hour    Equipment Recommendations  Tub/shower seat    Recommendations for Other Services       Precautions / Restrictions Precautions Precautions: Fall      Mobility Bed Mobility               General bed mobility comments: Pt sitting EOB   Transfers Overall transfer level: Modified independent                    Balance Overall balance assessment: Needs assistance;History of Falls Sitting-balance support: No upper extremity supported Sitting balance-Leahy Scale: Good     Standing balance support: During functional activity;No upper extremity supported Standing balance-Leahy Scale: Fair Standing balance comment: Pt mildly unsteady                            ADL either performed or assessed with clinical judgement   ADL Overall ADL's : Needs assistance/impaired Eating/Feeding: Independent   Grooming: Wash/dry hands;Wash/dry face;Oral care;Brushing hair;Supervision/safety;Standing   Upper Body Bathing: Supervision/ safety;Set up;Sitting   Lower Body Bathing: Supervison/ safety;Sit to/from stand   Upper Body Dressing : Supervision/safety;Set up;Sitting   Lower Body Dressing: Supervision/safety;Sit to/from stand   Toilet Transfer: Supervision/safety;Ambulation;Comfort height toilet   Toileting- Clothing Manipulation and Hygiene: Supervision/safety;Sit to/from stand       Functional mobility during ADLs: Supervision/safety General ADL Comments: Pt fatigues quickly with activity      Vision         Perception     Praxis      Pertinent Vitals/Pain Pain Assessment: No/denies pain     Hand Dominance Right   Extremity/Trunk Assessment Upper Extremity Assessment Upper Extremity Assessment: Generalized weakness   Lower Extremity Assessment Lower Extremity Assessment: Generalized weakness   Cervical / Trunk Assessment Cervical / Trunk Assessment: Normal   Communication Communication Communication: No difficulties   Cognition Arousal/Alertness: Awake/alert Behavior During Therapy: WFL for tasks assessed/performed Overall Cognitive Status: Within Functional Limits for tasks assessed                                     General Comments  Reviewed energy conservation strategies with pt.   recommend she either sponge bathe initially until endurance improves, or purchase tub seat.  02 sats RA 95%; during activity 88%, but recovered quickly     Exercises     Shoulder Instructions      Home Living Family/patient expects to be discharged to:: Private residence Living Arrangements: Alone Available Help at Discharge: Family Type of Home: Mobile home Home Access: Ramped entrance     Home Layout: One level  Bathroom Shower/Tub: Chief Strategy OfficerTub/shower unit   Bathroom Toilet: Handicapped height     Home Equipment: Environmental consultantWalker - 2 wheels;Cane - single point;Shower seat;Grab bars - tub/shower   Additional Comments: Pt reports friend and family will provide assistance as needed       Prior Functioning/Environment Level of Independence: Independent        Comments: Grandson takes pt to grocery store         OT  Problem List: Decreased strength;Decreased activity tolerance;Impaired balance (sitting and/or standing);Cardiopulmonary status limiting activity      OT Treatment/Interventions:      OT Goals(Current goals can be found in the care plan section) Acute Rehab OT Goals Patient Stated Goal: to go home and regain independence  OT Goal Formulation: All assessment and education complete, DC therapy  OT Frequency:     Barriers to D/C:            Co-evaluation              AM-PAC PT "6 Clicks" Daily Activity     Outcome Measure Help from another person eating meals?: None Help from another person taking care of personal grooming?: A [place] Help from another person toileting, which includes using toliet, bedpan, or urinal?: A [place] Help from another person bathing (including washing, rinsing, drying)?: A [place] Help from another person to put on and taking off regular upper body clothing?: A [place] Help from another person to put on and taking off regular lower body clothing?: A [place] 6 Click Score: 19   End of Session Equipment Utilized During Treatment: Oxygen Nurse Communication: Mobility status  Activity Tolerance: Patient limited by fatigue Patient left: in bed;with call bell/phone within reach  OT Visit Diagnosis: Unsteadiness on feet (R26.81)                Time: [id number] OT Time Calculation (min): 31 min Charges:  OT General Charges $OT Visit: 1 Visit OT Evaluation $OT Eval Low Complexity: 1 Low G-Codes:     Reynolds AmericanWendi Johndavid Geralds, OTR/L [phone]   Jeani HawkingConarpe, Eithel Ryall M 11/28/2017, 6:25 PM

## 2017-11-28 NOTE — Progress Notes (Signed)
Initial Nutrition Assessment  DOCUMENTATION CODES:  Non-severe (moderate) malnutrition in context of acute illness/injury  INTERVENTION:  Continue Ensure Enlive po BID, each supplement provides 350 kcal and 20 grams of protein.   Gave some brief education on maintaining good intake at home.   NUTRITION DIAGNOSIS:  Moderate Malnutrition related to acute illness(COPD exacerbation/severe SOB) as evidenced by loss of 4.5% bw x 3 weeks  GOAL:  Patient will meet greater than or equal to 90% of their needs  MONITOR:  PO intake, Supplement acceptance, Labs, Weight trends  REASON FOR ASSESSMENT:  Malnutrition Screening Tool    ASSESSMENT:  76 y/o female PMHx COPD, DM2. Presented to ED w/ c/o Left arm numbness/SOB. Workup imaging did not reveal any evidence of CVA, but did reveal new afib. Admitted for management.   Patient seen up sitting up on side of bed. Is extremely pleasant. At first when asked how she was eating at home, she said she "eats everything in sight", however later she recants this and says for about 3 weeks, she has been eating quite poorly "because I was sick". She has only eating eating "junk items" while watching TV. She endorses severe SOB with just walking one room to another. She does not drink supplements, nor does she take vitamins at baseline. She did not follow any therapeutic diets and when asked if she has DM she had said "no".   She is certain that she weighed 134 lbs 3 weeks ago at her outpatient PCP appointment. She went back a couple days ago and was 128 lbs. She attributes this to her SOB. These measurements appear to be inline with documentation. This is a clinically significant loss in acute context of 4.5% bw.  Physical Exam: Mild muscle/fat loss  At this time, she denies N/V/C/D. She is still having SOB and says she gets slightly SOB talking to me. She says she wants to ask her doctor about going home on O2.   She notes she lives alone. RD discussed  important of eating sufficient kcals/protein at home due to increased expenditure r/t COPD. She is mainly eating snacks items, of which are Stones benefit to her health. She says supplements are cost prohibitive. RD provided coupons. She is agreeable to having them while admitted as well. Currently eating well. Ate 100% breakfast.   Labs: A1C: 6.2, Creat: 1.92, BG now 126 Meds: Insulin  Recent Labs  Lab 11/27/17 1645 11/28/17 0903  NA 135 134*  K 4.3 4.5  CL 101 102  CO2 25 23  BUN 18 27*  CREATININE 1.92* 1.66*  CALCIUM 8.9 8.5*  GLUCOSE 131* 147*   NUTRITION - FOCUSED PHYSICAL EXAM:   Most Recent Value  Orbital Region  Mild depletion  Upper Arm Region  Mild depletion  Thoracic and Lumbar Region  Unable to assess  Buccal Region  No depletion  Temple Region  Mild depletion  Clavicle Bone Region  Mild depletion  Clavicle and Acromion Bone Region  Mild depletion  Scapular Bone Region  Mild depletion  Dorsal Hand  Mild depletion  Patellar Region  No depletion  Anterior Thigh Region  No depletion  Posterior Calf Region  No depletion      Diet Order:  Diet heart healthy/carb modified Room service appropriate? Yes; Fluid consistency: Thin  EDUCATION NEEDS:  No education needs have been identified at this time  Skin:  Skin Assessment: Reviewed RN Assessment  Last BM:  Unknown  Height:  Ht Readings from Last 1 Encounters:  02/27/17 5' 2.5" (1.588 m)   Weight:  Wt Readings from Last 1 Encounters:  11/27/17 128 lb (58.1 kg)   Wt Readings from Last 10 Encounters:  11/27/17 128 lb (58.1 kg)  11/27/17 128 lb 8 oz (58.3 kg)  11/02/17 133 lb 8 oz (60.6 kg)  02/27/17 130 lb 8 oz (59.2 kg)  02/11/17 135 lb 12.8 oz (61.6 kg)  01/19/17 129 lb 8 oz (58.7 kg)  01/02/17 134 lb (60.8 kg)  12/25/16 132 lb (59.9 kg)  11/06/16 129 lb 12 oz (58.9 kg)  10/30/16 127 lb 8 oz (57.8 kg)   Ideal Body Weight:  51.1 kg  BMI:  Body mass index is 23.04 kg/m.  Estimated Nutritional  Needs:  Kcal:  1550-1750 (27-30 kcal/kg bw) Protein:  65-75g Pro (1.1-1.3 g/kg bw) Fluid:  >1.45 L (25 ml/kg bw)  Christophe LouisNathan Franks RD, LDN, CNSC Clinical Nutrition Pager: [phone] 11/28/2017 10:40 AM

## 2017-11-28 NOTE — Progress Notes (Signed)
Pt discharge education completed at bedside. Pt has all belongings. Pt IV discontinued, catheter intact and telemetry removed. Awaiting pt oxygen delivery

## 2017-11-28 NOTE — Progress Notes (Signed)
MD aware of pt needing home oxygen

## 2017-11-28 NOTE — Progress Notes (Signed)
*  PRELIMINARY RESULTS* Echocardiogram 2D Echocardiogram has been performed.  Jeryl Columbialliott, Minnie Legros 11/28/2017, 11:40 AM

## 2017-11-28 NOTE — Progress Notes (Signed)
SATURATION QUALIFICATIONS: (This note is used to comply with regulatory documentation for home oxygen)  Patient Saturations on Room Air at Rest = 94%  Patient Saturations on Room Air while Ambulating = 87%  Patient Saturations on 2 Liters of oxygen while Ambulating = 91%  Please briefly explain why patient needs home oxygen:Patient desaturates with activity. Increased DOE as well with ambulation >15750ft  Dominique Clark, PT DPT  Board Certified Neurologic Specialist [phone]

## 2017-11-28 NOTE — Progress Notes (Signed)
[facility] at bedside delivering oxygen  Awaiting pt transportation for discharge

## 2017-11-28 NOTE — Progress Notes (Signed)
Pt transported to 2D echo on 2 L nasal canula  CCMD aware

## 2017-11-28 NOTE — Progress Notes (Signed)
Pt discharged via wheelchair with nurse staff  Pt has all belongings including printed prescriptions 

## 2017-11-28 NOTE — Progress Notes (Signed)
ANTICOAGULATION CONSULT NOTE - Initial Consult  Pharmacy Consult for Eliquis Indication: atrial fibrillation  Allergies  Allergen Reactions  . Doxycycline Nausea And Vomiting    Patient Measurements: Weight: 128 lb (58.1 kg)  Assessment: 76 yo F presents on 11/30 found to have Afib. SCr elevated at 1.66 and weight below 60 kg. Hgb 11.6, plts wnl.  Goal of Therapy:  Monitor platelets by anticoagulation protocol: Yes   Plan:  Start Eliquis 2.5mg  PO BID Monitor CBC, s/s of bleed   Enzo BiNathan Marylynne Keelin, PharmD, BCPS Clinical Pharmacist Pager [phone] 11/28/2017 10:50 AM

## 2017-11-28 NOTE — Progress Notes (Signed)
VASCULAR LAB PRELIMINARY  PRELIMINARY  PRELIMINARY  PRELIMINARY  Carotid duplex completed.    Preliminary report:  1-39% ICA stenosis.  Vertebral artery flow is antegrade.   Mishika Flippen, RVT 11/28/2017, 11:15 AM

## 2017-11-28 NOTE — Care Management CC44 (Signed)
Condition Code 44 Documentation Completed  Patient Details  Name: Thomes Dinninglsie I Vignola MRN: [id number] Date of Birth: 12/02/1941   Condition Code 44 given:  Yes Patient signature on Condition Code 44 notice:  Yes Documentation of 2 MD's agreement:  Yes Code 44 added to claim:  Yes    Lawerance Sabalebbie Canio Win[place]ur, RN 11/28/2017, 2:37 PM

## 2017-11-28 NOTE — Care Management Note (Signed)
Case Management Note  Patient Details  Name: Dominique Clark MRN: [id number] Date of Birth: 09/05/1941  Subjective/Objective:                 Spoke w patient at the bedside. Encouraged HH but patient refused. [facility]. It will be delivered to room prior to DC. Paient's son will pick her up. 30 day Eliquis card given to patient. CM signing off.   Action/Plan:   Expected Discharge Date:  11/30/17               Expected Discharge Plan:  Home/Self Care  In-House Referral:     Discharge planning Services  CM Consult  Post Acute Care Choice:  Durable Medical Equipment Choice offered to:  Patient  DME Arranged:  Oxygen DME Agency:  [facility]  [facility]     Status of Service:  Completed, signed off  If discussed at Long Length of Stay Meetings, dates discussed:    Additional Comments:  Lawerance SabalDebbie Cydnee Fuquay, RN 11/28/2017, 2:40 PM

## 2017-11-28 NOTE — Discharge Instructions (Addendum)
Transient Ischemic Attack °A transient ischemic attack (TIA) is a "warning stroke" that causes stroke-like symptoms. A TIA does not cause lasting damage to the brain. The symptoms of a TIA can happen fast and do not last long. It is important to know the symptoms of a TIA and what to do. This can help prevent stroke or death. °Follow these instructions at home: °· Take medicines only as told by your doctor. Make sure you understand all of the instructions. °· You may need to take aspirin or warfarin medicine. Warfarin needs to be taken exactly as told. °? Taking too much or too [place] warfarin is dangerous. Blood tests must be done as often as told by your doctor. A PT blood test measures how long it takes for blood to clot. Your PT is used to calculate another value called an INR. Your PT and INR help your doctor adjust your warfarin dosage. He or she will make sure you are taking the right amount. °? Food can cause problems with warfarin and affect the results of your blood tests. This is true for foods high in vitamin K. Eat the same amount of foods high in vitamin K each day. Foods high in vitamin K include spinach, kale, broccoli, cabbage, collard and turnip greens, Brussels sprouts, peas, cauliflower, seaweed, and parsley. Other foods high in vitamin K include beef and pork liver, green tea, and soybean oil. Eat the same amount of foods high in vitamin K each day. Avoid big changes in your diet. Tell your doctor before changing your diet. Talk to a food specialist (dietitian) if you have questions. °? Many medicines can cause problems with warfarin and affect your PT and INR. Tell your doctor about all medicines you take. This includes vitamins and dietary pills (supplements). Do not take or stop taking any prescribed or over-the-counter medicines unless your doctor tells you to. °? Warfarin can cause more bruising or bleeding. Hold pressure over any cuts for longer than normal. Talk to your doctor about other  side effects of warfarin. °? Avoid sports or activities that may cause injury or bleeding. °? Be careful when you shave, floss, or use sharp objects. °? Avoid or drink very [place] alcohol while taking warfarin. Tell your doctor if you change how much alcohol you drink. °? Tell your dentist and other doctors that you take warfarin before any procedures. °· Follow your diet program as told, if you are given one. °· Keep a healthy weight. °· Stay active. Try to get at least 30 minutes of activity on all or most days. °· Do not use any tobacco products, including cigarettes, chewing tobacco, or electronic cigarettes. If you need help quitting, ask your doctor. °· Limit alcohol intake to no more than 1 drink per day for nonpregnant women and 2 drinks per day for men. One drink equals 12 ounces of beer, 5 ounces of wine, or 1½ ounces of hard liquor. °· Do not abuse drugs. °· Keep your home safe so you do not fall. You can do this by: °? Putting grab bars in the bedroom and bathroom. °? Raising toilet seats. °? Putting a seat in the shower. °· Keep all follow-up visits as told by your doctor. This is important. °Contact a doctor if: °· Your personality changes. °· You have trouble swallowing. °· You have double vision. °· You are dizzy. °· You have a fever. °Get help right away if: °These symptoms may be an emergency. Do not wait to see if   the symptoms will go away. Get medical help right away. Call your local emergency services (911 in the [place] Do not drive yourself to the hospital.  You have sudden weakness or lose feeling (go numb), especially on one side of the body. This can affect your: ? Face. ? Arm. ? Leg.  You have sudden trouble walking.  You have sudden trouble moving your arms or legs.  You have sudden confusion.  You have trouble talking.  You have trouble understanding.  You have sudden trouble seeing in one or both eyes.  You lose your balance.  Your movements are not smooth.  You  have a sudden, very bad headache with no known cause.  You have new chest pain.  Your heartbeat is unsteady.  You are partly or totally unaware of what is going on around you.  This information is not intended to replace advice given to you by your health care provider. Make sure you discuss any questions you have with your health care provider. Document Released: 09/23/2008 Document Revised: 08/18/2016 Document Reviewed: 03/22/2014 Elsevier Interactive Patient Education  2018 [employer]    Information on my medicine - ELIQUIS (apixaban)  Why was Eliquis prescribed for you? Eliquis was prescribed for you to reduce the risk of a blood clot forming that can cause a stroke if you have a medical condition called atrial fibrillation (a type of irregular heartbeat).  What do You need to know about Eliquis ? Take your Eliquis TWICE DAILY - one tablet in the morning and one tablet in the evening with or without food. If you have difficulty swallowing the tablet whole please discuss with your pharmacist how to take the medication safely.  Take Eliquis exactly as prescribed by your doctor and DO NOT stop taking Eliquis without talking to the doctor who prescribed the medication.  Stopping may increase your risk of developing a stroke.  Refill your prescription before you run out.  After discharge, you should have regular check-up appointments with your healthcare provider that is prescribing your Eliquis.  In the future your dose may need to be changed if your kidney function or weight changes by a significant amount or as you get older.  What do you do if you miss a dose? If you miss a dose, take it as soon as you remember on the same day and resume taking twice daily.  Do not take more than one dose of ELIQUIS at the same time to make up a missed dose.  Important Safety Information A possible side effect of Eliquis is bleeding. You should call your healthcare provider right away if  you experience any of the following: ? Bleeding from an injury or your nose that does not stop. ? Unusual colored urine (red or dark brown) or unusual colored stools (red or black). ? Unusual bruising for unknown reasons. ? A serious fall or if you hit your head (even if there is no bleeding).  Some medicines may interact with Eliquis and might increase your risk of bleeding or clotting while on Eliquis. To help avoid this, consult your healthcare provider or pharmacist prior to using any new prescription or non-prescription medications, including herbals, vitamins, non-steroidal anti-inflammatory drugs (NSAIDs) and supplements.  This website has more information on Eliquis (apixaban): http://www.eliquis.com/eliquis/home

## 2017-11-28 NOTE — Evaluation (Signed)
Physical Therapy Evaluation Patient Details Name: Dominique Clark I Dowe MRN: [id number] DOB: 01/12/1941 Today's Date: 11/28/2017   History of Present Illness  76 y.o. female, w Dm2, Copd, apparently presents to ED, c/o left arm numbness starting at about 1:30pm along with slurred speech. Neuro work up underway, MRI negative.  Clinical Impression  Orders received for PT evaluation. Patient demonstrates deficits in functional mobility as indicated below. Will benefit from continued skilled PT to address deficits and maximize function. Will see as indicated and progress as tolerated.  OF NOTE: Activity on room air desaturation to 87%, increased DOE noted. HR elevated 120s. Improved with rest break and replacement of supplemental O2.  Recommend initial supervision and If going home on O2, recommend HHPT.     Follow Up Recommendations Home health PT;Supervision/Assistance - 24 hour(initially)    Equipment Recommendations  None recommended by PT    Recommendations for Other Services       Precautions / Restrictions Precautions Precautions: Fall Restrictions Weight Bearing Restrictions: No      Mobility  Bed Mobility               General bed mobility comments: received EOB  Transfers Overall transfer level: Modified independent Equipment used: None             General transfer comment: increased time to perform, no physical assist required  Ambulation/Gait Ambulation/Gait assistance: Supervision Ambulation Distance (Feet): 160 Feet Assistive device: None Gait Pattern/deviations: Step-through pattern;Decreased stride length;Trunk flexed;Drifts right/left;Narrow base of support Gait velocity: decreased   General Gait Details: some instability noted, no physical assist required(amb on room air with increased DOE and desat to 87%)  Stairs            Wheelchair Mobility    Modified Rankin (Stroke Patients Only)       Balance Overall balance assessment: Needs  assistance;History of Falls                           High level balance activites: Backward walking;Direction changes;Turns;Head turns High Level Balance Comments: modest instability noted with higher level tasks, min guard for safety             Pertinent Vitals/Pain Pain Assessment: No/denies pain    Home Living Family/patient expects to be discharged to:: Private residence Living Arrangements: Alone Available Help at Discharge: Family Type of Home: Mobile home Home Access: Ramped entrance     Home Layout: One level Home Equipment: Environmental consultantWalker - 2 wheels;Cane - single point;Shower seat;Grab bars - tub/shower      Prior Function Level of Independence: Independent               Hand Dominance   Dominant Hand: Right    Extremity/Trunk Assessment   Upper Extremity Assessment Upper Extremity Assessment: Generalized weakness    Lower Extremity Assessment Lower Extremity Assessment: Generalized weakness       Communication      Cognition Arousal/Alertness: Awake/alert Behavior During Therapy: WFL for tasks assessed/performed Overall Cognitive Status: No family/caregiver present to determine baseline cognitive functioning                                        General Comments      Exercises     Assessment/Plan    PT Assessment Patient needs continued PT services  PT Problem List Decreased strength;Decreased activity  tolerance;Decreased balance;Decreased mobility;Cardiopulmonary status limiting activity       PT Treatment Interventions Gait training;Functional mobility training;Therapeutic activities;Therapeutic exercise;Balance training;Patient/family education    PT Goals (Current goals can be found in the Care Plan section)  Acute Rehab PT Goals Patient Stated Goal: to go home to her dog PT Goal Formulation: With patient Time For Goal Achievement: 12/12/17 Potential to Achieve Goals: Good    Frequency Min 3X/week    Barriers to discharge        Co-evaluation               AM-PAC PT "6 Clicks" Daily Activity  Outcome Measure Difficulty turning over in bed (including adjusting bedclothes, sheets and blankets)?: None Difficulty moving from lying on back to sitting on the side of the bed? : None Difficulty sitting down on and standing up from a chair with arms (e.g., wheelchair, bedside commode, etc,.)?: A Golob Help needed moving to and from a bed to chair (including a wheelchair)?: A Celli Help needed walking in hospital room?: A Veracruz Help needed climbing 3-5 steps with a railing? : A Donofrio 6 Click Score: 20    End of Session Equipment Utilized During Treatment: Gait belt;Oxygen Activity Tolerance: Patient limited by fatigue Patient left: in bed;with call bell/phone within reach;with bed alarm set(sitting EOB) Nurse Communication: Mobility status(O2 saturations) PT Visit Diagnosis: Unsteadiness on feet (R26.81)    Time: [id number] PT Time Calculation (min) (ACUTE ONLY): 18 min   Charges:   PT Evaluation $PT Eval Moderate Complexity: 1 Mod     PT G Codes:   PT G-Codes **NOT FOR INPATIENT CLASS** Functional Assessment Tool Used: Clinical judgement Functional Limitation: Mobility: Walking and moving around Mobility: Walking and Moving Around Current Status ([id number]([id number]): At least 1 percent but less than 20 percent impaired, limited or restricted Mobility: Walking and Moving Around Goal Status [phone](G8979): 0 percent impaired, limited or restricted    Charlotte Crumbevon Yalda Herd, PT DPT  Board Certified Neurologic Specialist [phone]   Fabio AsaDevon J Daleen Steinhaus 11/28/2017, 9:38 AM

## 2017-11-28 NOTE — Progress Notes (Signed)
STROKE TEAM PROGRESS NOTE   HISTORY OF PRESENT ILLNESS (per record) Dominique Clark is a 76 y.o. female past medical history of COPD, diabetes, who was in usual state of health until about 2 days ago when she started feeling not so well.  She felt really bad with generalized weakness and malaise all day yesterday.  She felt worn out and achy.  She said she almost felt like she was coming down with something like a flu.  She started feeling palpitations and chest discomfort for which she called her primary care and made an appointment to see the primary care this morning.  Upon arrival at the family care doctor's office, she was noted to be in atrial fibrillation with a heart rate in the 140s.  Per chart review she does not carry a diagnosis of atrial fibrillation and this is a new diagnosis. She also said that right before she went to her doctor, today she felt that her speech was a Dominique Clark slurred and left hand and arm were slightly numb and this lasted only for less than 5 minutes.  She does not report any headaches.  Denies visual symptoms.  Denies any facial weakness. Her family was at bedside and they confirmed that she is back to her baseline as she says. They do believe that she has mild asymmetry of her face with the left angle of the mouth being slightly lower than the right when asked if she looked abnormal when I was examining her.   LKW: More than 24 hours ago tpa given?: no, outside the window Premorbid modified Rankin scale (mRS):1     SUBJECTIVE (INTERVAL HISTORY) No family members present. Dr. Pearlean Clark discussed anticoagulation for atrial fibrillation. He recommended Eliquis. Pharmacy will be consult for dosing. Patient appears to have had a TIA.   OBJECTIVE Temp:  [97.5 F (36.4 C)-98.3 F (36.8 C)] 98 F (36.7 C) (12/01 1356) Pulse Rate:  [66-152] 77 (12/01 1356) Cardiac Rhythm: Normal sinus rhythm (12/01 0800) Resp:  [17-23] 18 (12/01 1356) BP: (102-159)/(55-114) 131/55  (12/01 1356) SpO2:  [92 %-100 %] 95 % (12/01 1356) FiO2 (%):  [0 %] 0 % (11/30 2100) Weight:  [128 lb (58.1 kg)] 128 lb (58.1 kg) (12/01 1500)  CBC:  Recent Labs  Lab 11/27/17 1645  WBC 5.6  HGB 11.6*  HCT 37.7  MCV 90.0  PLT 283    Basic Metabolic Panel:  Recent Labs  Lab 11/27/17 1645 11/28/17 0903  NA 135 134*  K 4.3 4.5  CL 101 102  CO2 25 23  GLUCOSE 131* 147*  BUN 18 27*  CREATININE 1.92* 1.66*  CALCIUM 8.9 8.5*    Lipid Panel:     Component Value Date/Time   CHOL 144 11/28/2017 0326   TRIG 75 11/28/2017 0326   HDL 46 11/28/2017 0326   CHOLHDL 3.1 11/28/2017 0326   VLDL 15 11/28/2017 0326   LDLCALC 83 11/28/2017 0326   HgbA1c:  Lab Results  Component Value Date   HGBA1C 6.2 (H) 11/28/2017   Urine Drug Screen: No results found for: LABOPIA, COCAINSCRNUR, LABBENZ, AMPHETMU, THCU, LABBARB  Alcohol Level No results found for: ETH  IMAGING   Ct Head Wo Contrast 11/27/2017 IMPRESSION:  1. No acute intracranial abnormality identified.  2. Mild chronic microvascular ischemic changes and mild parenchymal volume loss of the brain.    Mr Dominique Clark Head Wo Contrast 11/28/2017 IMPRESSION:   MRI HEAD  1. No acute intracranial abnormality.  2. Age related cerebral atrophy  with mild chronic small vessel ischemic disease.   MRA HEAD  Normal intracranial MRA.    Dg Chest Portable 1 View 11/27/2017 IMPRESSION:  1. No acute abnormality.  2. Stable changes of COPD and chronic bronchitis.  3. Moderate-sized hiatal hernia.  4. Large, chronic right rotator cuff tear.     Transthoracic Echocardiogram  11/28/2017 Study Conclusions - Left ventricle: The cavity size was normal. There was mild focal   basal hypertrophy of the septum. Systolic function was normal.   The estimated ejection fraction was in the range of 55% to 60%.   Wall motion was normal; there were no regional wall motion   abnormalities. Features are consistent with a pseudonormal left    ventricular filling pattern, with concomitant abnormal relaxation   and increased filling pressure (grade 2 diastolic dysfunction). - Aortic valve: Trileaflet; mildly thickened, mildly calcified   leaflets. - Mitral valve: There was mild to moderate regurgitation. - Tricuspid valve: There was moderate regurgitation. - Pulmonary arteries: Systolic pressure was mildly increased. PA   peak pressure: 38 mm Hg (S).   Bilateral Carotid Dopplers  11/28/2017 Right Carotid: There is evidence in the right ICA of a 1-39% stenosis. Left Carotid: There is evidence in the left ICA of a 1-39% stenosis. Vertebrals: Both vertebral arteries were patent with antegrade flow.    PHYSICAL EXAM Vitals:   11/28/17 0909 11/28/17 1005 11/28/17 1356 11/28/17 1500  BP:  (!) 159/71 (!) 131/55   Pulse:  74 77   Resp:  18 18   Temp:  97.9 F (36.6 C) 98 F (36.7 C)   TempSrc:  Oral Oral   SpO2: 98% 98% 95%   Weight:    128 lb (58.1 kg)  Height:    5\' 2"  (1.575 m)   Pleasant elderly Caucasian lady not in distress. . Afebrile. Head is nontraumatic. Neck is supple without bruit.    Cardiac exam no murmur or gallop. Lungs are clear to auscultation. Distal pulses are well felt. Neurological Exam ;  Awake  Alert oriented x 3. Normal speech and language.eye movements full without nystagmus.fundi were not visualized. Vision acuity and fields appear normal. Hearing is normal. Palatal movements are normal. Face symmetric. Tongue midline. Normal strength, tone, reflexes and coordination. Normal sensation. Gait deferred.    ASSESSMENT/PLAN Dominique Clark is a 76 y.o. female with history of diabetes mellitus and COPD presenting with transient slurred speech and left upper extremity numbness. She did not receive IV t-PA due to resolution of deficits.  TIA:  Likely embolic secondary to atrial fibrillation without anticoagulation.  Resultant - resolution of deficits.  CT head - No acute intracranial abnormality  identified.   MRI head - No acute intracranial abnormality identified.   MRA head - normal  Carotid Doppler - unremarkable  2D Echo - EF 55-60%. No cardiac source of emboli identified.  LDL - 83  HgbA1c - 6.2  VTE prophylaxis - Eliquis and SCDs (will D/C SCDs) Diet heart healthy/carb modified Room service appropriate? Yes; Fluid consistency: Thin Diet - low sodium heart healthy  No antithrombotic prior to admission, now on Eliquis (apixaban) daily  Patient counseled to be compliant with her antithrombotic medications  Ongoing aggressive stroke risk factor management  Therapy recommendations:  pending  Disposition:  Pending  Hypertension  Stable  Permissive hypertension (OK if < 220/120) but gradually normalize in 5-7 days  Long-term BP goal normotensive  Hyperlipidemia  Home meds:  No lipid lowering medications prior to admission  LDL 83, goal < 70  Now on Lipitor 80 mg daily  Continue statin at discharge  Diabetes  HgbA1c 6.2, goal < 7.0  Controlled  Other Stroke Risk Factors  Advanced age  Former cigarette smoker - quit 28 years ago  Family hx stroke (mother)  Atrial fibrillation - new diagnosis   Other Active Problems  Creatinine - 1.92 -> 1.66   Plan / Recommendations  Eliquis anticoagulation for new atrial fibrillation diagnosis with TIA.  Follow-up Dr. Kathaleen GrinderSethi  Hospital day # 1  Delton Seeavid Rinehuls PA-C [facility] Pager [phone] 11/28/2017, 4:13 PM  I have personally examined this patient, reviewed notes, independently viewed imaging studies, participated in medical decision making and plan of care.ROS completed by me personally and pertinent positives fully documented  I have made any additions or clarifications directly to the above note. Agree with note above.  She presented with transient speech difficulties and left hand numbness due to right hemispheric TIA from atrial fibrillation.  Recommend long-term  anticoagulation with Eliquis and aggressive risk factor modification.  Long discussion with the patient about atrial fibrillation, TIA and stroke risk reduction and risk benefit of anticoagulation and answered questions.  Discussed with Dr. Rito EhrlichKrishnan.  Greater than 50% time during this 25-minute visit was spent on counseling and coordination of care about TIA and atrial fibrillation and stroke risk. Delia HeadyPramod Karron Alvizo, MD Medical Director [facility] Pager: [phone] 11/28/2017 5:59 PM  To contact Stroke Continuity provider, please refer to [email or web address]. After hours, contact General Neurology

## 2017-11-28 NOTE — Discharge Summary (Signed)
[facility]  Physician Discharge Summary   Patient ID: Dominique Clark MRN: [id number] DOB/AGE: Aug 14, 1941 76 y.o.  Admit date: 11/27/2017 Discharge date: 11/28/2017  PCP: [name], MD  DISCHARGE DIAGNOSES:  Principal Problem:   TIA (transient ischemic attack) Active Problems:   COPD (chronic obstructive pulmonary disease) (HCC)   Hyperglycemia   Atrial fibrillation with RVR (HCC)   RECOMMENDATIONS FOR OUTPATIENT FOLLOW UP: 1. Follow-up with PCP within 1 week.   2. Consider referral to cardiology for atrial fibrillation. 3. Patient has been started on anticoagulation. 4. Needs outpatient monitoring for hyperglycemia   DISCHARGE CONDITION: fair  Diet recommendation: Heart healthy  Filed Weights   11/27/17 1621  Weight: 58.1 kg (128 lb)    INITIAL HISTORY: 76 year old Caucasian female with past medical history of COPD presented with complains of left arm numbness along with slurred speech.  Upon arrival in the emergency department EKG shows atrial fibrillation with RVR.  Consultations:  Neurology  Procedures: Carotid Doppler Final Interpretation: Right Carotid: There is evidence in the right ICA of a 1-39% stenosis.  Left Carotid: There is evidence in the left ICA of a 1-39% stenosis.  Vertebrals: Both vertebral arteries were patent with antegrade flow.  Transthoracic echocardiogram Study Conclusions  - Left ventricle: The cavity size was normal. There was mild focal   basal hypertrophy of the septum. Systolic function was normal.   The estimated ejection fraction was in the range of 55% to 60%.   Wall motion was normal; there were no regional wall motion   abnormalities. Features are consistent with a pseudonormal left   ventricular filling pattern, with concomitant abnormal relaxation   and increased filling pressure (grade 2 diastolic dysfunction). - Aortic valve: Trileaflet; mildly thickened, mildly calcified   leaflets. - Mitral  valve: There was mild to moderate regurgitation. - Tricuspid valve: There was moderate regurgitation. - Pulmonary arteries: Systolic pressure was mildly increased. PA   peak pressure: 38 mm Hg (S).   HOSPITAL COURSE:   TIA Patient presented with left arm numbness slurred speech.  The symptoms resolved completely.  MRI brain did not show any acute infarct.  Patient was seen by neurology.  She underwent a carotid Doppler as well as echocardiogram.  These were unremarkable.  LDL was 83.  She was started on Lipitor.  Due to atrial fibrillation she is thought to require anticoagulation to prevent future strokes.  Patient was started on Eliquis by neurology which will be continued on discharge.  Patient seen by physical and occupational therapy.  Home health has been ordered.  Atrial fibrillation, paroxysmal Initially presented with RVR.  She converted on her own to sinus rhythm.  Continue with the beta-blocker.  Minimal elevation in troponin likely due to tachyarrhythmia.  EKG otherwise did not show any ischemic changes.  Echocardiogram shows normal systolic function.  TSH 0.779.  Patient started on Eliquis as discussed above.  She can be referred to cardiology as outpatient. Chads2vasc: at least 5.  History of COPD Stable.  However she was noted to drop her sats to 87% with ambulation.  Chest x-ray did not show any acute findings.  She will need home oxygen.  Chronic Kidney disease likely stage III Creatinine is stable.  Normocytic anemia Stable  Hyperglycemia No known history of diabetes.  HbA1c 6.2.  Considering this is an elderly patient we would not want to aggressively treat this. PCP to address further.  Patient feels well this morning.  Denies any complaints.  Looking forward to  going home. Discussed with her son. Ok for discharge.    PERTINENT LABS:  The results of significant diagnostics from this hospitalization (including imaging, microbiology, ancillary and laboratory) are  listed below for reference.      Labs: Basic Metabolic Panel: Recent Labs  Lab 11/27/17 1645 11/28/17 0903  NA 135 134*  K 4.3 4.5  CL 101 102  CO2 25 23  GLUCOSE 131* 147*  BUN 18 27*  CREATININE 1.92* 1.66*  CALCIUM 8.9 8.5*   Liver Function Tests: Recent Labs  Lab 11/27/17 1645  AST 28  ALT 12*  ALKPHOS 60  BILITOT 0.4  PROT 6.8  ALBUMIN 3.6   Recent Labs  Lab 11/27/17 1645  LIPASE 35   CBC: Recent Labs  Lab 11/27/17 1645  WBC 5.6  HGB 11.6*  HCT 37.7  MCV 90.0  PLT 283   Cardiac Enzymes: Recent Labs  Lab 11/27/17 2131 11/28/17 0326 11/28/17 0903  TROPONINI 0.08* 0.06* 0.05*   BNP: BNP (last 3 results) Recent Labs    11/27/17 1645  BNP 214.4*    CBG: Recent Labs  Lab 11/27/17 2147 11/28/17 0701 11/28/17 1149  GLUCAP 198* 126* 100*     IMAGING STUDIES Ct Head Wo Contrast  Result Date: 11/27/2017 CLINICAL DATA:  76 y/o  F; transient left hand numbness. EXAM: CT HEAD WITHOUT CONTRAST TECHNIQUE: Contiguous axial images were obtained from the base of the skull through the vertex without intravenous contrast. COMPARISON:  None. FINDINGS: Brain: No evidence of acute infarction, hemorrhage, hydrocephalus, extra-axial collection or mass lesion/mass effect. Few nonspecific foci of hypoattenuation in subcortical and periventricular white matter compatible with mild chronic microvascular ischemic changes. Mild brain parenchymal volume loss. Vascular: Calcific atherosclerosis of carotid siphons. No hyperdense vessel. Skull: Normal. Negative for fracture or focal lesion. Sinuses/Orbits: No acute finding. Other: None. IMPRESSION: 1. No acute intracranial abnormality identified. 2. Mild chronic microvascular ischemic changes and mild parenchymal volume loss of the brain. Electronically Signed   By: Mitzi HansenLance  Furusawa-Stratton M.D.   On: 11/27/2017 19:16   Mr Brain Wo Contrast  Result Date: 11/28/2017 CLINICAL DATA:  Initial evaluation for left arm  numbness with slurred speech. EXAM: MRI HEAD WITHOUT CONTRAST MRA HEAD WITHOUT CONTRAST TECHNIQUE: Multiplanar, multiecho pulse sequences of the brain and surrounding structures were obtained without intravenous contrast. Angiographic images of the head were obtained using MRA technique without contrast. COMPARISON:  Prior CT from 11/27/2017. FINDINGS: MRI HEAD FINDINGS Brain: Diffuse prominence of the CSF containing spaces compatible with generalized cerebral atrophy. Patchy and confluent T2/FLAIR hyperintensity within the periventricular and deep white matter both cerebral hemispheres most compatible chronic small vessel ischemic disease, mild in nature. No abnormal foci of restricted diffusion to suggest acute or subacute ischemia. Gray-white matter differentiation maintained. No encephalomalacia to suggest chronic infarction. No abnormal foci of restricted diffusion to suggest acute or chronic intracranial hemorrhage. No mass lesion, midline shift or mass effect. No hydrocephalus. No extra-axial fluid collection. Major dural sinuses are grossly patent. Pituitary gland suprasellar region normal. Midline structures intact and normal. Vascular: Major intracranial vascular flow voids are maintained. Skull and upper cervical spine: Craniocervical junction normal. Multilevel degenerate spondylolysis noted within the upper cervical spine without significant stenosis. Bone marrow signal intensity within normal limits. No scalp soft tissue abnormality. Sinuses/Orbits: Globes and orbital soft tissues within normal limits. Paranasal sinuses are largely clear. Small left mastoid effusion noted, of doubtful significance. Inner ear structures normal. Other: None. MRA HEAD FINDINGS ANTERIOR CIRCULATION: Distal cervical segments of  the internal carotid arteries are widely patent with antegrade flow. Petrous, cavernous, and supraclinoid segments patent without flow-limiting stenosis. A1 segments patent bilaterally. Left A1  segment hypoplastic, which likely accounts for the diminutive left ICA is compared to the right. Normal anterior communicating artery. Anterior cerebral artery is widely patent to their distal aspects. M1 segments patent without stenosis or occlusion. Normal MCA bifurcations. No proximal M2 occlusion. Distal MCA branches well perfused and symmetric. POSTERIOR CIRCULATION: Vertebral arteries widely patent to the vertebrobasilar junction. Left vertebral artery dominant. Posterior inferior cerebral arteries patent bilaterally. Basilar artery widely patent to its distal aspect. Superior cerebral arteries patent bilaterally. Both of the posterior cerebral artery is supplied via the basilar and are widely patent to their distal aspects. No aneurysm. IMPRESSION: MRI HEAD IMPRESSION: 1. No acute intracranial abnormality. 2. Age related cerebral atrophy with mild chronic small vessel ischemic disease. MRA HEAD IMPRESSION: Normal intracranial MRA. Electronically Signed   By: Rise Mu M.D.   On: 11/28/2017 03:18   Dg Chest Portable 1 View  Result Date: 11/27/2017 CLINICAL DATA:  Tachycardia and shortness of breath. EXAM: PORTABLE CHEST 1 VIEW COMPARISON:  02/27/2017. FINDINGS: The cardiac silhouette remains borderline enlarged. The lungs remain clear and hyperexpanded with mild diffuse peribronchial thickening and accentuation of the interstitial markings. A moderate-sized hiatal hernia is again demonstrated. Superior migration of the right humeral head with bony remodeling. IMPRESSION: 1. No acute abnormality. 2. Stable changes of COPD and chronic bronchitis. 3. Moderate-sized hiatal hernia. 4. Large, chronic right rotator cuff tear. Electronically Signed   By: Beckie Salts M.D.   On: 11/27/2017 16:58   Mr Maxine Glenn Head Wo Contrast  Result Date: 11/28/2017 CLINICAL DATA:  Initial evaluation for left arm numbness with slurred speech. EXAM: MRI HEAD WITHOUT CONTRAST MRA HEAD WITHOUT CONTRAST TECHNIQUE:  Multiplanar, multiecho pulse sequences of the brain and surrounding structures were obtained without intravenous contrast. Angiographic images of the head were obtained using MRA technique without contrast. COMPARISON:  Prior CT from 11/27/2017. FINDINGS: MRI HEAD FINDINGS Brain: Diffuse prominence of the CSF containing spaces compatible with generalized cerebral atrophy. Patchy and confluent T2/FLAIR hyperintensity within the periventricular and deep white matter both cerebral hemispheres most compatible chronic small vessel ischemic disease, mild in nature. No abnormal foci of restricted diffusion to suggest acute or subacute ischemia. Gray-white matter differentiation maintained. No encephalomalacia to suggest chronic infarction. No abnormal foci of restricted diffusion to suggest acute or chronic intracranial hemorrhage. No mass lesion, midline shift or mass effect. No hydrocephalus. No extra-axial fluid collection. Major dural sinuses are grossly patent. Pituitary gland suprasellar region normal. Midline structures intact and normal. Vascular: Major intracranial vascular flow voids are maintained. Skull and upper cervical spine: Craniocervical junction normal. Multilevel degenerate spondylolysis noted within the upper cervical spine without significant stenosis. Bone marrow signal intensity within normal limits. No scalp soft tissue abnormality. Sinuses/Orbits: Globes and orbital soft tissues within normal limits. Paranasal sinuses are largely clear. Small left mastoid effusion noted, of doubtful significance. Inner ear structures normal. Other: None. MRA HEAD FINDINGS ANTERIOR CIRCULATION: Distal cervical segments of the internal carotid arteries are widely patent with antegrade flow. Petrous, cavernous, and supraclinoid segments patent without flow-limiting stenosis. A1 segments patent bilaterally. Left A1 segment hypoplastic, which likely accounts for the diminutive left ICA is compared to the right. Normal  anterior communicating artery. Anterior cerebral artery is widely patent to their distal aspects. M1 segments patent without stenosis or occlusion. Normal MCA bifurcations. No proximal M2 occlusion. Distal MCA branches  well perfused and symmetric. POSTERIOR CIRCULATION: Vertebral arteries widely patent to the vertebrobasilar junction. Left vertebral artery dominant. Posterior inferior cerebral arteries patent bilaterally. Basilar artery widely patent to its distal aspect. Superior cerebral arteries patent bilaterally. Both of the posterior cerebral artery is supplied via the basilar and are widely patent to their distal aspects. No aneurysm. IMPRESSION: MRI HEAD IMPRESSION: 1. No acute intracranial abnormality. 2. Age related cerebral atrophy with mild chronic small vessel ischemic disease. MRA HEAD IMPRESSION: Normal intracranial MRA. Electronically Signed   By: Rise MuBenjamin  McClintock M.D.   On: 11/28/2017 03:18    DISCHARGE EXAMINATION: Vitals:   11/28/17 0800 11/28/17 0909 11/28/17 1005 11/28/17 1356  BP: (!) 127/96  (!) 159/71 (!) 131/55  Pulse: 82  74 77  Resp: 18  18 18   Temp:   97.9 F (36.6 C) 98 F (36.7 C)  TempSrc:   Oral Oral  SpO2:  98% 98% 95%  Weight:       General appearance: alert, cooperative, appears stated age and no distress Resp: clear to auscultation bilaterally Cardio: regular rate and rhythm, S1, S2 normal, no murmur, click, rub or gallop GI: soft, non-tender; bowel sounds normal; no masses,  no organomegaly  DISPOSITION: Home with family  Discharge Instructions    Ambulatory referral to Neurology   Complete by:  As directed    An appointment is requested in approximately: 6 weeks   Call MD for:  extreme fatigue   Complete by:  As directed    Call MD for:  persistant dizziness or light-headedness   Complete by:  As directed    Call MD for:  persistant nausea and vomiting   Complete by:  As directed    Call MD for:  severe uncontrolled pain   Complete by:  As  directed    Call MD for:  temperature >100.4   Complete by:  As directed    Diet - low sodium heart healthy   Complete by:  As directed    Discharge instructions   Complete by:  As directed    Follow-up with your primary care provider within 1 week.  Consider outpatient referral to cardiology.  You were cared for by a hospitalist during your hospital stay. If you have any questions about your discharge medications or the care you received while you were in the hospital after you are discharged, you can call the unit and asked to speak with the hospitalist on call if the hospitalist that took care of you is not available. Once you are discharged, your primary care physician will handle any further medical issues. Please note that NO REFILLS for any discharge medications will be authorized once you are discharged, as it is imperative that you return to your primary care physician (or establish a relationship with a primary care physician if you do not have one) for your aftercare needs so that they can reassess your need for medications and monitor your lab values. If you do not have a primary care physician, you can call [phone] for a physician referral.   Increase activity slowly   Complete by:  As directed       ALLERGIES:  Allergies  Allergen Reactions  . Doxycycline Nausea And Vomiting      Allergies as of 11/28/2017      Reactions   Doxycycline Nausea And Vomiting      Medication List    TAKE these medications   albuterol 108 (90 Base) MCG/ACT inhaler Commonly known as:  PROVENTIL HFA;VENTOLIN HFA Inhale 1-2 puffs into the lungs every 4 (four) hours as needed for wheezing. Okay to fill with ventolin or proventil if needed.   apixaban 2.5 MG Tabs tablet Commonly known as:  ELIQUIS Take 1 tablet (2.5 mg total) by mouth 2 (two) times daily.   atorvastatin 80 MG tablet Commonly known as:  LIPITOR Take 1 tablet (80 mg total) by mouth daily at 6 PM.   budesonide-formoterol  160-4.5 MCG/ACT inhaler Commonly known as:  SYMBICORT INHALE 2 PUFFS EVERY 12 HOURS TO PREVENTCOUGH OR WHEEZING--RINSE, GARGLE, & SPITAFTER EACH USE. What changed:    how much to take  how to take this  when to take this  additional instructions   fluticasone 50 MCG/ACT nasal spray Commonly known as:  FLONASE INHALE 2 SPRAYS INTO EACH NOSTRIL EVERY DAY. What changed:    how much to take  how to take this  when to take this  reasons to take this  additional instructions   meloxicam 15 MG tablet Commonly known as:  MOBIC Take 0.5 tablets (7.5 mg total) daily by mouth.   metoprolol succinate 25 MG 24 hr tablet Commonly known as:  TOPROL-XL Take 1 tablet (25 mg total) by mouth daily. TAKE 1 TABLET EVERY DAY NEED MD APPOINTMENT FOR REFILLS What changed:  See the new instructions.   NEXIUM PO Take 1 capsule by mouth daily.   tiotropium 18 MCG inhalation capsule Commonly known as:  SPIRIVA Place 18 mcg into inhaler and inhale daily.   traMADol 50 MG tablet Commonly known as:  ULTRAM TAKE 1 TO 2 TABLETS BY MOUTH EVERY 8 HOURS AS NEEDED FOR PAIN. What changed:    how much to take  how to take this  when to take this  additional instructions   vitamin E 1000 UNIT capsule Take 1,000 Units by mouth daily.            Durable Medical Equipment  (From admission, onward)        Start     Ordered   11/28/17 1342  For home use only DME oxygen  Once    Question Answer Comment  Mode or (Route) Nasal cannula   Liters per Minute 2   Frequency Continuous (stationary and portable oxygen unit needed)   Oxygen conserving device Yes   Oxygen delivery system Gas      11/28/17 1341       Follow-up Information    [name], MD. Schedule an appointment as soon as possible for a visit in 1 week(s).   Specialty:  Family Medicine Why:  Consider referral to cardiology for afib Contact information: [address] [place]  [address] [phone]        Micki Riley, MD. Schedule an appointment as soon as possible for a visit in 6 week(s).   Specialties:  Neurology, Radiology Contact information: [address] [place] [address] [phone]        [facility] - Dme Follow up.   Why:  home oxygen. Portable tank to be delivered to room prior to DC Contact information: [address] [place] [address] [phone]           TOTAL DISCHARGE TIME: 35 mins  Osvaldo Shipper  [facility] Pager [phone]  11/28/2017, 3:04 PM

## 2017-11-29 ENCOUNTER — Encounter: Payer: Self-pay | Admitting: Family Medicine

## 2017-11-29 DIAGNOSIS — I4891 Unspecified atrial fibrillation: Secondary | ICD-10-CM | POA: Insufficient documentation

## 2017-11-29 NOTE — Patient Instructions (Signed)
To ER via EMS 

## 2017-11-30 ENCOUNTER — Telehealth: Payer: Self-pay | Admitting: Family Medicine

## 2017-11-30 DIAGNOSIS — I6782 Cerebral ischemia: Secondary | ICD-10-CM | POA: Diagnosis not present

## 2017-11-30 DIAGNOSIS — J449 Chronic obstructive pulmonary disease, unspecified: Secondary | ICD-10-CM | POA: Diagnosis not present

## 2017-11-30 DIAGNOSIS — Z8673 Personal history of transient ischemic attack (TIA), and cerebral infarction without residual deficits: Secondary | ICD-10-CM | POA: Diagnosis not present

## 2017-11-30 DIAGNOSIS — Z9981 Dependence on supplemental oxygen: Secondary | ICD-10-CM | POA: Diagnosis not present

## 2017-11-30 DIAGNOSIS — E119 Type 2 diabetes mellitus without complications: Secondary | ICD-10-CM | POA: Diagnosis not present

## 2017-11-30 DIAGNOSIS — Z7951 Long term (current) use of inhaled steroids: Secondary | ICD-10-CM | POA: Diagnosis not present

## 2017-11-30 DIAGNOSIS — K5790 Diverticulosis of intestine, part unspecified, without perforation or abscess without bleeding: Secondary | ICD-10-CM | POA: Diagnosis not present

## 2017-11-30 DIAGNOSIS — I4891 Unspecified atrial fibrillation: Secondary | ICD-10-CM | POA: Diagnosis not present

## 2017-11-30 DIAGNOSIS — M8580 Other specified disorders of bone density and structure, unspecified site: Secondary | ICD-10-CM | POA: Diagnosis not present

## 2017-11-30 NOTE — Telephone Encounter (Signed)
Order given to Deniece PortelaMary Sankar with [facility].

## 2017-11-30 NOTE — Telephone Encounter (Signed)
Copied from CRM [phone]#[id number]. Topic: Quick Communication - See Telephone Encounter >> Nov 30, 2017 11:26 AM Elliot GaultBell, Tiffany M wrote: CRM for notification. See Telephone encounter for:   11/30/17.  Caller name: Deniece PortelaMary Sankar Relation to pt: PT from [facility]  Call back number: [phone]   Reason for call:  Requesting verbal orders PT 3x 1 2x 2 weak and unsteady.

## 2017-11-30 NOTE — Telephone Encounter (Signed)
Please give the order.  Thanks.   

## 2017-12-01 ENCOUNTER — Encounter: Payer: Self-pay | Admitting: Family Medicine

## 2017-12-01 ENCOUNTER — Ambulatory Visit: Payer: Medicare HMO | Admitting: Family Medicine

## 2017-12-01 VITALS — BP 120/78 | HR 74 | Wt 131.2 lb

## 2017-12-01 DIAGNOSIS — R0902 Hypoxemia: Secondary | ICD-10-CM | POA: Diagnosis not present

## 2017-12-01 DIAGNOSIS — G459 Transient cerebral ischemic attack, unspecified: Secondary | ICD-10-CM

## 2017-12-01 DIAGNOSIS — I4891 Unspecified atrial fibrillation: Secondary | ICD-10-CM | POA: Diagnosis not present

## 2017-12-01 MED ORDER — APIXABAN 2.5 MG PO TABS: 2.5000 mg | ORAL_TABLET | Freq: Two times a day (BID) | ORAL | 12 refills | Status: DC

## 2017-12-01 MED ORDER — METOPROLOL SUCCINATE ER 25 MG PO TB24: 25.0000 mg | ORAL_TABLET | Freq: Every day | ORAL | 12 refills | Status: DC

## 2017-12-01 NOTE — Progress Notes (Signed)
Inpatient f/u.   Admitted with AF with RVR, started on supplemental O2 and anticoagulation.  Med list reviewed.  Now on O2.  She had 1 night off O2 and didn't feel well at the time but has been compliant in the meantime.  Due for labs.  Inpatient course d/w pt.    Anticoagulated.  Compliant.   D/w pt about rationale for use of anticoagulation.   Off spiriva since that made her cough worse.  Compliant with other inhalers.  No fevers.  No chills.    She clearly feels better compared to last OV.    No new or persistent neuro sx, with likely prev TIA d/w pt.    PMH and SH reviewed  ROS: Per HPI unless specifically indicated in ROS section   Meds, vitals, and allergies reviewed.   GEN: nad, alert and oriented, on O2.  HEENT: mucous membranes moist NECK: supple w/o LA CV: IRR, not tachy PULM: scant ext wheeze but o/w ctab, no inc wob ABD: soft, +bs EXT: no edema SKIN: no acute rash

## 2017-12-01 NOTE — Patient Instructions (Addendum)
We'll contact you with your lab report. Shirlee LimerickMarion will call about your referrals.  Take care.  Glad to see you.  Update me as needed.   Plan on recheck in about 3 months, sooner if needed.   Continue oxygen as is for now.

## 2017-12-02 ENCOUNTER — Other Ambulatory Visit: Payer: Self-pay | Admitting: Family Medicine

## 2017-12-02 DIAGNOSIS — E875 Hyperkalemia: Secondary | ICD-10-CM

## 2017-12-02 LAB — CBC WITH DIFFERENTIAL/PLATELET
BASOS ABS: 0.1 10*3/uL (ref 0.0–0.1)
BASOS PCT: 1 % (ref 0.0–3.0)
EOS ABS: 0.6 10*3/uL (ref 0.0–0.7)
Eosinophils Relative: 11.2 % — ABNORMAL HIGH (ref 0.0–5.0)
HCT: 35.7 % — ABNORMAL LOW (ref 36.0–46.0)
HEMOGLOBIN: 11.4 g/dL — AB (ref 12.0–15.0)
LYMPHS PCT: 28 % (ref 12.0–46.0)
Lymphs Abs: 1.6 10*3/uL (ref 0.7–4.0)
MCHC: 31.9 g/dL (ref 30.0–36.0)
MCV: 90.4 fl (ref 78.0–100.0)
MONO ABS: 1.1 10*3/uL — AB (ref 0.1–1.0)
Monocytes Relative: 20 % — ABNORMAL HIGH (ref 3.0–12.0)
NEUTROS ABS: 2.2 10*3/uL (ref 1.4–7.7)
Neutrophils Relative %: 39.8 % — ABNORMAL LOW (ref 43.0–77.0)
PLATELETS: 262 10*3/uL (ref 150.0–400.0)
RBC: 3.95 Mil/uL (ref 3.87–5.11)
RDW: 15.5 % (ref 11.5–15.5)
WBC: 5.6 10*3/uL (ref 4.0–10.5)

## 2017-12-02 LAB — BASIC METABOLIC PANEL
BUN: 20 mg/dL (ref 6–23)
CALCIUM: 9 mg/dL (ref 8.4–10.5)
CHLORIDE: 99 meq/L (ref 96–112)
CO2: 30 mEq/L (ref 19–32)
CREATININE: 1.17 mg/dL (ref 0.40–1.20)
GFR: 47.63 mL/min — ABNORMAL LOW (ref >60.00)
Glucose, Bld: 101 mg/dL — ABNORMAL HIGH (ref 70–99)
Potassium: 5.4 mEq/L — ABNORMAL HIGH (ref 3.5–5.1)
Sodium: 135 mEq/L (ref 135–145)

## 2017-12-02 NOTE — Assessment & Plan Note (Signed)
Rate controlled, anticoagulated.  Refer to cards.  Continue current meds.  Recheck labs today. Okay for outpatient f/u.  Path/phys d/w pt. >25 minutes spent in face to face time with patient, >50% spent in counselling or coordination of care.

## 2017-12-02 NOTE — Assessment & Plan Note (Signed)
Now with O2 dependence, refer to pulmonary.  See above re: inhalers.  At this point, okay for outpatient f/u.

## 2017-12-02 NOTE — Assessment & Plan Note (Signed)
Likely, path/phys d/w pt.  Continue anticoagulation.  If any new neuro sx, then to ER.  She agrees. No paresthesias now.

## 2017-12-03 ENCOUNTER — Ambulatory Visit: Payer: Medicare HMO | Admitting: Internal Medicine

## 2017-12-03 ENCOUNTER — Encounter: Payer: Self-pay | Admitting: Internal Medicine

## 2017-12-03 VITALS — BP 122/82 | HR 60 | Resp 13 | Ht 62.0 in | Wt 132.0 lb

## 2017-12-03 DIAGNOSIS — J449 Chronic obstructive pulmonary disease, unspecified: Secondary | ICD-10-CM | POA: Diagnosis not present

## 2017-12-03 MED ORDER — FLUTICASONE-UMECLIDIN-VILANT 100-62.5-25 MCG/INH IN AEPB: 1.0000 | INHALATION_SPRAY | Freq: Every day | RESPIRATORY_TRACT | 5 refills | Status: DC

## 2017-12-03 NOTE — Progress Notes (Signed)
[facility]      Assessment and Plan:  Severe emphysema with dyspnea on exertion and chronic hypoxic respiratory failure.  -Patient is currently on Symbicort, and develops cough with Spiriva, requesting a new inhaler.  She is given a prescription for Trelegy inhaler.  She is asked to call us back if this is too expensive and we can try something else. - The patient will follow-up with us in 3 months, at that time we will consider referral to pulmonary rehab.  She currently is enrolled in physical therapy which should help with her functional status. -She was ambulated around the clinic today with continued hypoxemia with ambulation, she can continue oxygen.  She did complain that her oxygen tank was too big and heavy, we will therefore send a request to advanced home care to see if they can set her up with a portable oxygen concentrator.   Date: 12/03/2017  MRN# [id number] Dominique Clark 08/26/1941   Dominique Clark is a 76 y.o. old female seen in consultation for chief complaint of:    Chief Complaint  Patient presents with  . Advice Only    pt referred by Dr.Duncan Cheree DittoGraham for hypoxia  . COPD    Pt d/c Spiriva as it made her cough worse.    HPI:   The patient is a 76 year old female recently admitted to the hospital for 2 days, discharged on 11/28/17.  At that time it was noted that she had atrial fibrillation with RVR, TIA symptoms.  She is referred here first history of COPD. She is present with a friend today who provides some of the history. She lives herself and she drives a car. She used to be able to go to the grocery store, however over the past 6-12 months she has not been able to get out by herself because she develops coughing and dyspnea.  She was not on oxygen before her recent hospitalization. She is currently on 2L at all times and with activity. She is currently using symbicort 2 puffs bid, and spiriva once daily but that make her cough. She  has proventil every 4 hours. She does not have a nebulizer.   She used to smoke 1.5 ppd, until 20 years ago. Smoked for about 25  Years.   12/03/17 Desat walk on RA at rest was 93% and HR 73. Walked 180 feet with sat of 86% and HR 97. Moderate dyspnea, she did not think she could go any further.   Imaging personally reviewed, chest x-ray 11/27/17; severe hyperinflation, and findings consistent with severe emphysema/COPD.   PMHX:   Past Medical History:  Diagnosis Date  . AF (atrial fibrillation) (HCC)   . COPD (chronic obstructive pulmonary disease) (HCC)   . Diabetes mellitus    type 2  . Diverticulosis    s/p colonoscopy  . Gastritis    via EGD  . Hiatal hernia    s/p dilation 2004  . Osteopenia   . Stress incontinence, female   . TIA (transient ischemic attack)    Surgical Hx:  Past Surgical History:  Procedure Laterality Date  . BLADDER REPAIR  9/04   bladder tack   . BREAST CYST EXCISION Right 1972  . TONSILLECTOMY     age 76  . TOTAL ABDOMINAL HYSTERECTOMY     Family Hx:  Family History  Problem Relation Age of Onset  . Stroke Mother   . Breast cancer Neg Hx   . Colon cancer Neg Hx  Social Hx:   Social History   Tobacco Use  . Smoking status: Former Smoker    Types: Cigarettes    Last attempt to quit: 12/29/1988    Years since quitting: 28.9  . Smokeless tobacco: Never Used  . Tobacco comment: 20 or more years   Substance Use Topics  . Alcohol use: No    Alcohol/week: 0.0 oz  . Drug use: No   Medication:    Current Outpatient Medications:  .  albuterol (PROVENTIL HFA;VENTOLIN HFA) 108 (90 Base) MCG/ACT inhaler, Inhale 1-2 puffs into the lungs every 4 (four) hours as needed for wheezing. Okay to fill with ventolin or proventil if needed., Disp: 3 Inhaler, Rfl: 3 .  apixaban (ELIQUIS) 2.5 MG TABS tablet, Take 1 tablet (2.5 mg total) by mouth 2 (two) times daily., Disp: 60 tablet, Rfl: 12 .  atorvastatin (LIPITOR) 80 MG tablet, Take 1 tablet (80 mg  total) by mouth daily at 6 PM., Disp: 30 tablet, Rfl: 0 .  budesonide-formoterol (SYMBICORT) 160-4.5 MCG/ACT inhaler, INHALE 2 PUFFS EVERY 12 HOURS TO PREVENTCOUGH OR WHEEZING--RINSE, GARGLE, & SPITAFTER EACH USE. (Patient taking differently: Inhale 2 puffs into the lungs every 12 (twelve) hours. TO PREVENT COUGH OR WHEEZING--RINSE, GARGLE, & SPIT AFTER EACH USE.), Disp: 3 Inhaler, Rfl: 3 .  Esomeprazole Magnesium (NEXIUM PO), Take 1 capsule by mouth daily. , Disp: , Rfl:  .  fluticasone (FLONASE) 50 MCG/ACT nasal spray, INHALE 2 SPRAYS INTO EACH NOSTRIL EVERY DAY. (Patient taking differently: Place 2 sprays into both nostrils daily as needed (seasonal allergies). ), Disp: 48 g, Rfl: 3 .  meloxicam (MOBIC) 15 MG tablet, Take 0.5 tablets (7.5 mg total) daily by mouth., Disp: 45 tablet, Rfl: 1 .  metoprolol succinate (TOPROL-XL) 25 MG 24 hr tablet, Take 1 tablet (25 mg total) by mouth daily., Disp: 30 tablet, Rfl: 12 .  traMADol (ULTRAM) 50 MG tablet, TAKE 1 TO 2 TABLETS BY MOUTH EVERY 8 HOURS AS NEEDED FOR PAIN. (Patient taking differently: Take 50-100 mg by mouth See admin instructions. Take 2 tablets (100 mg) by mouth every morning and take 1 or 2 tablets (50-100 mg) at night - for arthritis pain), Disp: 540 tablet, Rfl: 1 .  vitamin E 1000 UNIT capsule, Take 1,000 Units by mouth daily., Disp: , Rfl:    Allergies:  Doxycycline  Review of Systems: Gen:  Denies  fever, sweats, chills HEENT: Denies blurred vision, double vision. bleeds, sore throat Cvc:  No dizziness, chest pain. Resp:   Denies cough or sputum production, shortness of breath Gi: Denies swallowing difficulty, stomach pain. Gu:  Denies bladder incontinence, burning urine Ext:   No Joint pain, stiffness. Skin: No skin rash,  hives  Endoc:  No polyuria, polydipsia. Psych: No depression, insomnia. Other:  All other systems were reviewed with the patient and were negative other that what is mentioned in the HPI.   Physical  Examination:   VS: BP 122/82 (BP Location: Left Arm, Cuff Size: Normal)   Pulse 60   Resp 13   Ht 5\' 2"  (1.575 m)   Wt 132 lb (59.9 kg)   SpO2 95%   BMI 24.14 kg/m   General Appearance: No distress  Neuro:without focal findings,  speech normal,  HEENT: PERRLA, EOM intact.   Pulmonary: Decreased air entry bilaterally. CardiovascularNormal S1,S2.  No m/r/g.   Abdomen: Benign, Soft, non-tender. Renal:  No costovertebral tenderness  GU:  No performed at this time. Endoc: No evident thyromegaly, no signs of acromegaly.  Skin:   warm, no rashes, no ecchymosis  Extremities: normal, no cyanosis, clubbing.  Other findings:    LABORATORY PANEL:   CBC Recent Labs  Lab 12/01/17 1626  WBC 5.6  HGB 11.4*  HCT 35.7*  PLT 262.0   ------------------------------------------------------------------------------------------------------------------  Chemistries  Recent Labs  Lab 11/27/17 1645  12/01/17 1626  NA 135   < > 135  K 4.3   < > 5.4*  CL 101   < > 99  CO2 25   < > 30  GLUCOSE 131*   < > 101*  BUN 18   < > 20  CREATININE 1.92*   < > 1.17  CALCIUM 8.9   < > 9.0  AST 28  --   --   ALT 12*  --   --   ALKPHOS 60  --   --   BILITOT 0.4  --   --    < > = values in this interval not displayed.   ------------------------------------------------------------------------------------------------------------------  Cardiac Enzymes Recent Labs  Lab 11/28/17 0903  TROPONINI 0.05*   ------------------------------------------------------------  RADIOLOGY:  No results found.     Thank  you for the consultation and for allowing [facility] to assist in the care of your patient. Our recommendations are noted above.  Please contact us if we can be of further service.   Wells Guileseep Nikhil Osei, MD.  Board Certified in Internal Medicine, Pulmonary Medicine, Critical Care Medicine, and Sleep Medicine.  [facility] Office Number: [phone]  [phone]  Santiago Gladavid Kasa, M.D.  Billy Fischeravid Simonds, M.D  12/03/2017

## 2017-12-03 NOTE — Patient Instructions (Addendum)
Will change symbicort and spiriva to Trelegy inhaler. Let us know if Trelegy is too expensive.

## 2017-12-04 DIAGNOSIS — I6782 Cerebral ischemia: Secondary | ICD-10-CM | POA: Diagnosis not present

## 2017-12-04 DIAGNOSIS — Z7951 Long term (current) use of inhaled steroids: Secondary | ICD-10-CM | POA: Diagnosis not present

## 2017-12-04 DIAGNOSIS — E119 Type 2 diabetes mellitus without complications: Secondary | ICD-10-CM | POA: Diagnosis not present

## 2017-12-04 DIAGNOSIS — K5790 Diverticulosis of intestine, part unspecified, without perforation or abscess without bleeding: Secondary | ICD-10-CM | POA: Diagnosis not present

## 2017-12-04 DIAGNOSIS — I4891 Unspecified atrial fibrillation: Secondary | ICD-10-CM | POA: Diagnosis not present

## 2017-12-04 DIAGNOSIS — Z9981 Dependence on supplemental oxygen: Secondary | ICD-10-CM | POA: Diagnosis not present

## 2017-12-04 DIAGNOSIS — Z8673 Personal history of transient ischemic attack (TIA), and cerebral infarction without residual deficits: Secondary | ICD-10-CM | POA: Diagnosis not present

## 2017-12-04 DIAGNOSIS — M8580 Other specified disorders of bone density and structure, unspecified site: Secondary | ICD-10-CM | POA: Diagnosis not present

## 2017-12-04 DIAGNOSIS — J449 Chronic obstructive pulmonary disease, unspecified: Secondary | ICD-10-CM | POA: Diagnosis not present

## 2017-12-09 DIAGNOSIS — E119 Type 2 diabetes mellitus without complications: Secondary | ICD-10-CM | POA: Diagnosis not present

## 2017-12-09 DIAGNOSIS — J449 Chronic obstructive pulmonary disease, unspecified: Secondary | ICD-10-CM | POA: Diagnosis not present

## 2017-12-09 DIAGNOSIS — K5790 Diverticulosis of intestine, part unspecified, without perforation or abscess without bleeding: Secondary | ICD-10-CM | POA: Diagnosis not present

## 2017-12-09 DIAGNOSIS — I4891 Unspecified atrial fibrillation: Secondary | ICD-10-CM | POA: Diagnosis not present

## 2017-12-09 DIAGNOSIS — Z8673 Personal history of transient ischemic attack (TIA), and cerebral infarction without residual deficits: Secondary | ICD-10-CM | POA: Diagnosis not present

## 2017-12-09 DIAGNOSIS — Z9981 Dependence on supplemental oxygen: Secondary | ICD-10-CM | POA: Diagnosis not present

## 2017-12-09 DIAGNOSIS — Z7951 Long term (current) use of inhaled steroids: Secondary | ICD-10-CM | POA: Diagnosis not present

## 2017-12-09 DIAGNOSIS — I6782 Cerebral ischemia: Secondary | ICD-10-CM | POA: Diagnosis not present

## 2017-12-09 DIAGNOSIS — M8580 Other specified disorders of bone density and structure, unspecified site: Secondary | ICD-10-CM | POA: Diagnosis not present

## 2017-12-10 ENCOUNTER — Other Ambulatory Visit (INDEPENDENT_AMBULATORY_CARE_PROVIDER_SITE_OTHER): Payer: Medicare HMO

## 2017-12-10 DIAGNOSIS — E875 Hyperkalemia: Secondary | ICD-10-CM

## 2017-12-10 LAB — BASIC METABOLIC PANEL
BUN: 13 mg/dL (ref 6–23)
CALCIUM: 9.3 mg/dL (ref 8.4–10.5)
CO2: 33 mEq/L — ABNORMAL HIGH (ref 19–32)
CREATININE: 1.14 mg/dL (ref 0.40–1.20)
Chloride: 99 mEq/L (ref 96–112)
GFR: 49.08 mL/min — AB (ref >60.00)
Glucose, Bld: 112 mg/dL — ABNORMAL HIGH (ref 70–99)
Potassium: 4.9 mEq/L (ref 3.5–5.1)
SODIUM: 139 meq/L (ref 135–145)

## 2017-12-11 DIAGNOSIS — Z7951 Long term (current) use of inhaled steroids: Secondary | ICD-10-CM | POA: Diagnosis not present

## 2017-12-11 DIAGNOSIS — K5790 Diverticulosis of intestine, part unspecified, without perforation or abscess without bleeding: Secondary | ICD-10-CM | POA: Diagnosis not present

## 2017-12-11 DIAGNOSIS — M8580 Other specified disorders of bone density and structure, unspecified site: Secondary | ICD-10-CM | POA: Diagnosis not present

## 2017-12-11 DIAGNOSIS — I4891 Unspecified atrial fibrillation: Secondary | ICD-10-CM | POA: Diagnosis not present

## 2017-12-11 DIAGNOSIS — I6782 Cerebral ischemia: Secondary | ICD-10-CM | POA: Diagnosis not present

## 2017-12-11 DIAGNOSIS — J449 Chronic obstructive pulmonary disease, unspecified: Secondary | ICD-10-CM | POA: Diagnosis not present

## 2017-12-11 DIAGNOSIS — Z9981 Dependence on supplemental oxygen: Secondary | ICD-10-CM | POA: Diagnosis not present

## 2017-12-11 DIAGNOSIS — E119 Type 2 diabetes mellitus without complications: Secondary | ICD-10-CM | POA: Diagnosis not present

## 2017-12-11 DIAGNOSIS — Z8673 Personal history of transient ischemic attack (TIA), and cerebral infarction without residual deficits: Secondary | ICD-10-CM | POA: Diagnosis not present

## 2017-12-15 DIAGNOSIS — Z7951 Long term (current) use of inhaled steroids: Secondary | ICD-10-CM | POA: Diagnosis not present

## 2017-12-15 DIAGNOSIS — E119 Type 2 diabetes mellitus without complications: Secondary | ICD-10-CM | POA: Diagnosis not present

## 2017-12-15 DIAGNOSIS — I6782 Cerebral ischemia: Secondary | ICD-10-CM | POA: Diagnosis not present

## 2017-12-15 DIAGNOSIS — I4891 Unspecified atrial fibrillation: Secondary | ICD-10-CM | POA: Diagnosis not present

## 2017-12-15 DIAGNOSIS — K5790 Diverticulosis of intestine, part unspecified, without perforation or abscess without bleeding: Secondary | ICD-10-CM | POA: Diagnosis not present

## 2017-12-15 DIAGNOSIS — Z8673 Personal history of transient ischemic attack (TIA), and cerebral infarction without residual deficits: Secondary | ICD-10-CM | POA: Diagnosis not present

## 2017-12-15 DIAGNOSIS — M8580 Other specified disorders of bone density and structure, unspecified site: Secondary | ICD-10-CM | POA: Diagnosis not present

## 2017-12-15 DIAGNOSIS — Z9981 Dependence on supplemental oxygen: Secondary | ICD-10-CM | POA: Diagnosis not present

## 2017-12-15 DIAGNOSIS — J449 Chronic obstructive pulmonary disease, unspecified: Secondary | ICD-10-CM | POA: Diagnosis not present

## 2017-12-18 DIAGNOSIS — I4891 Unspecified atrial fibrillation: Secondary | ICD-10-CM | POA: Diagnosis not present

## 2017-12-18 DIAGNOSIS — Z7951 Long term (current) use of inhaled steroids: Secondary | ICD-10-CM | POA: Diagnosis not present

## 2017-12-18 DIAGNOSIS — J449 Chronic obstructive pulmonary disease, unspecified: Secondary | ICD-10-CM | POA: Diagnosis not present

## 2017-12-18 DIAGNOSIS — M8580 Other specified disorders of bone density and structure, unspecified site: Secondary | ICD-10-CM | POA: Diagnosis not present

## 2017-12-18 DIAGNOSIS — E119 Type 2 diabetes mellitus without complications: Secondary | ICD-10-CM | POA: Diagnosis not present

## 2017-12-18 DIAGNOSIS — K5790 Diverticulosis of intestine, part unspecified, without perforation or abscess without bleeding: Secondary | ICD-10-CM | POA: Diagnosis not present

## 2017-12-18 DIAGNOSIS — Z8673 Personal history of transient ischemic attack (TIA), and cerebral infarction without residual deficits: Secondary | ICD-10-CM | POA: Diagnosis not present

## 2017-12-18 DIAGNOSIS — Z9981 Dependence on supplemental oxygen: Secondary | ICD-10-CM | POA: Diagnosis not present

## 2017-12-18 DIAGNOSIS — I6782 Cerebral ischemia: Secondary | ICD-10-CM | POA: Diagnosis not present

## 2017-12-28 ENCOUNTER — Other Ambulatory Visit: Payer: Self-pay | Admitting: Family Medicine

## 2017-12-28 NOTE — Telephone Encounter (Signed)
Copied from CRM [id number]. Topic: Quick Communication - Rx Refill/Question >> Dec 28, 2017 11:37 AM Louie BunPalacios Medina, Rosey Batheresa D wrote: Has the patient contacted their pharmacy? No (Agent: If no, request that the patient contact the pharmacy for the refill.) Preferred Pharmacy (with phone number or street name): [facility] - [place], [place] - [address], [address] Agent: Please be advised that RX refills may take up to 3 business days. We ask that you follow-up with your pharmacy. Patient called and would like antibiotic for sinus infection and prednisone.

## 2017-12-29 DIAGNOSIS — J441 Chronic obstructive pulmonary disease with (acute) exacerbation: Secondary | ICD-10-CM | POA: Diagnosis not present

## 2017-12-30 NOTE — Telephone Encounter (Signed)
Electronic refill Last office visit 12/01/17 Medication is no longer on medication list.

## 2017-12-30 NOTE — Telephone Encounter (Signed)
  Pt called regarding her request for prednisone and an antibiotic. Pt states she her head is stopped up. She is on oxygen and has COPD. She is is coughing up light green secretions right now and feels like this is going to get worse. She also has congested chest.  Requesting med called in for sinus infection.  Her call back # is [phone].

## 2017-12-30 NOTE — Telephone Encounter (Signed)
Spoke to pt and advised she would need to be seen for abx Rx. Pt scheduled with Dr Para Marchuncan 1/4. Offered sooner appt but pt states she only wanted to see Dr Para Marchuncan.

## 2017-12-30 NOTE — Telephone Encounter (Deleted)
Requesting to speak with

## 2017-12-31 ENCOUNTER — Other Ambulatory Visit: Payer: Self-pay | Admitting: *Deleted

## 2017-12-31 MED ORDER — ATORVASTATIN CALCIUM 80 MG PO TABS: 80.0000 mg | ORAL_TABLET | Freq: Every day | ORAL | 5 refills | Status: DC

## 2017-12-31 NOTE — Telephone Encounter (Signed)
Copied from CRM [phone]. Topic: Quick Communication - Office Called Patient >> Dec 31, 2017  4:07 PM Annamarie MajorFuquay, Lugene S, CMA wrote: Reason for CRM:  Request for Prednisone refill was received.  Dr. Para Marchuncan is asking for the patient status or update on her condition prior to refilling Prednisone.  Please take information. >> Dec 31, 2017  4:14 PM Alexander BergeronBarksdale, Harvey B wrote: Pt said she is not better or any worse, still congested and her head has pressure(sinus), contact to advise is needed

## 2017-12-31 NOTE — Telephone Encounter (Signed)
Left detailed message on voicemail asking patient to call back with update on her condition per Dr. Para Marchuncan.

## 2017-12-31 NOTE — Telephone Encounter (Signed)
Please clarify with patient.  What is her status?  Thanks.

## 2018-01-01 ENCOUNTER — Encounter: Payer: Self-pay | Admitting: Family Medicine

## 2018-01-01 ENCOUNTER — Ambulatory Visit (INDEPENDENT_AMBULATORY_CARE_PROVIDER_SITE_OTHER): Payer: Medicare HMO | Admitting: Family Medicine

## 2018-01-01 DIAGNOSIS — J0100 Acute maxillary sinusitis, unspecified: Secondary | ICD-10-CM

## 2018-01-01 MED ORDER — FLUTICASONE PROPIONATE 50 MCG/ACT NA SUSP: 2.0000 | Freq: Every day | NASAL | Status: DC | PRN

## 2018-01-01 MED ORDER — PREDNISONE 20 MG PO TABS: ORAL_TABLET | ORAL | 0 refills | Status: DC

## 2018-01-01 MED ORDER — AMOXICILLIN-POT CLAVULANATE 875-125 MG PO TABS: 1.0000 | ORAL_TABLET | Freq: Two times a day (BID) | ORAL | 0 refills | Status: DC

## 2018-01-01 NOTE — Telephone Encounter (Signed)
Hold on rx for now.  Keep OV for today.  Thanks.

## 2018-01-01 NOTE — Patient Instructions (Signed)
Restart prednisone with food, start augmentin and update us as needed.  Take care.  Glad to see you.

## 2018-01-01 NOTE — Progress Notes (Signed)
Still on O2 at baseline.  Chest congestion.  Some wheeze.  Some sputum.  Taking some mucinex.  No fevers.  Some SOB, esp with exertion.   She couldn't afford her new combination inhaler, but is still on symbicort and prn albuterol.   Last prednisone use was early 10/2017, about 2 months ago.   Meds, vitals, and allergies reviewed.   ROS: Per HPI unless specifically indicated in ROS section   GEN: nad, alert and oriented, on O2 at baseline.  HEENT: mucous membranes moist, tm w/o erythema, nasal exam w/o erythema, clear discharge noted,  OP with cobblestoning, B max sinuses ttp NECK: supple w/o LA CV: IRR, not tachy   PULM: ctab except for B exp wheeze noted. , no inc wob EXT: no edema SKIN: no acute rash

## 2018-01-03 NOTE — Assessment & Plan Note (Signed)
Okay for outpatient f/u.  Restart prednisone with food, start augmentin and update us as needed.  She agrees.  Continue baseline O2 and inhalers.

## 2018-01-12 NOTE — Progress Notes (Deleted)
Cardiology Office Note  Date:  01/12/2018   ID:  Dominique Clark, DOB 1941/03/07, MRN [id number]  PCP:  [name], MD   No chief complaint on file.   HPI:   COPD Chronic kidney disease Anemia Borderline diabetes Presents to establish care in the [place] office after recent discharge from the hospital December 2018 for atrial fibrillation  Admitted to the hospital 11/27/2017 with shortness of breath, Admitted with AF with RVR,  COPD exacerbation Treated with nebulizers, steroids   reports episode of slurred speech, left hand numbness resolved after 5 minutes Diagnosed with TIA Seen by neurology Carotid ultrasound less than 40% bilaterally  Echocardiogram ejection fraction 55-60% Moderate MR and TR mildly elevated right her pressures  Discharge from the hospital on anticoagulation, eliquis 2.5 twice a day Metoprolol succinate 25 daily    started on supplemental O2 and anticoagulation.  Med list reviewed.  Now on O2.  She had 1 night off O2 and didn't feel well at the time but has been compliant in the meantime.  Due for labs.  Inpatient course d/w pt.    Anticoagulated.  Compliant.   D/w pt about rationale for use of anticoagulation.   Off spiriva since that made her cough worse.  Compliant with other inhalers.  No fevers.  No chills.    She clearly feels better compared to last OV.    No new or persistent neuro sx, with likely prev TIA d/w pt.      PMH:   has a past medical history of AF (atrial fibrillation) (HCC), COPD (chronic obstructive pulmonary disease) (HCC), Diabetes mellitus, Diverticulosis, Gastritis, Hiatal hernia, Osteopenia, Stress incontinence, female, and TIA (transient ischemic attack).  PSH:    Past Surgical History:  Procedure Laterality Date  . BLADDER REPAIR  9/04   bladder tack   . BREAST CYST EXCISION Right 1972  . TONSILLECTOMY     age 39  . TOTAL ABDOMINAL HYSTERECTOMY      Current Outpatient Medications  Medication Sig  Dispense Refill  . albuterol (PROVENTIL HFA;VENTOLIN HFA) 108 (90 Base) MCG/ACT inhaler Inhale 1-2 puffs into the lungs every 4 (four) hours as needed for wheezing. Okay to fill with ventolin or proventil if needed. 3 Inhaler 3  . amoxicillin-clavulanate (AUGMENTIN) 875-125 MG tablet Take 1 tablet by mouth 2 (two) times daily. 20 tablet 0  . apixaban (ELIQUIS) 2.5 MG TABS tablet Take 1 tablet (2.5 mg total) by mouth 2 (two) times daily. 60 tablet 12  . atorvastatin (LIPITOR) 80 MG tablet Take 1 tablet (80 mg total) by mouth daily at 6 PM. 30 tablet 5  . budesonide-formoterol (SYMBICORT) 160-4.5 MCG/ACT inhaler INHALE 2 PUFFS EVERY 12 HOURS TO PREVENTCOUGH OR WHEEZING--RINSE, GARGLE, & SPITAFTER EACH USE. (Patient taking differently: Inhale 2 puffs into the lungs every 12 (twelve) hours. TO PREVENT COUGH OR WHEEZING--RINSE, GARGLE, & SPIT AFTER EACH USE.) 3 Inhaler 3  . Esomeprazole Magnesium (NEXIUM PO) Take 1 capsule by mouth daily.     . fluticasone (FLONASE) 50 MCG/ACT nasal spray Place 2 sprays into both nostrils daily as needed (seasonal allergies).    . meloxicam (MOBIC) 15 MG tablet Take 0.5 tablets (7.5 mg total) daily by mouth. 45 tablet 1  . metoprolol succinate (TOPROL-XL) 25 MG 24 hr tablet Take 1 tablet (25 mg total) by mouth daily. 30 tablet 12  . predniSONE (DELTASONE) 20 MG tablet 2 a day for 5 days, then 1 a day for 5 days. With food. 15 tablet 0  .  traMADol (ULTRAM) 50 MG tablet TAKE 1 TO 2 TABLETS BY MOUTH EVERY 8 HOURS AS NEEDED FOR PAIN. (Patient taking differently: Take 50-100 mg by mouth See admin instructions. Take 2 tablets (100 mg) by mouth every morning and take 1 or 2 tablets (50-100 mg) at night - for arthritis pain) 540 tablet 1  . vitamin E 1000 UNIT capsule Take 1,000 Units by mouth daily.     No current facility-administered medications for this visit.      Allergies:   Doxycycline   Social History:  The patient  reports that she quit smoking about 29 years ago.  Her smoking use included cigarettes. she has never used smokeless tobacco. She reports that she does not drink alcohol or use drugs.   Family History:   family history includes Stroke in her mother.    Review of Systems: ROS   PHYSICAL EXAM: VS:  There were no vitals taken for this visit. , BMI There is no height or weight on file to calculate BMI. GEN: Well nourished, well developed, in no acute distress HEENT: normal Neck: no JVD, carotid bruits, or masses Cardiac: RRR; no murmurs, rubs, or gallops,no edema  Respiratory:  clear to auscultation bilaterally, normal work of breathing GI: soft, nontender, nondistended, + BS MS: no deformity or atrophy Skin: warm and dry, no rash Neuro:  Strength and sensation are intact Psych: euthymic mood, full affect    Recent Labs: 11/27/2017: ALT 12; B Natriuretic Peptide 214.4; TSH 0.779 12/01/2017: Hemoglobin 11.4; Platelets 262.0 12/10/2017: BUN 13; Creatinine, Ser 1.14; Potassium 4.9; Sodium 139    Lipid Panel Lab Results  Component Value Date   CHOL 144 11/28/2017   HDL 46 11/28/2017   LDLCALC 83 11/28/2017   TRIG 75 11/28/2017      Wt Readings from Last 3 Encounters:  01/01/18 130 lb (59 kg)  12/03/17 132 lb (59.9 kg)  12/01/17 131 lb 4 oz (59.5 kg)       ASSESSMENT AND PLAN:  No diagnosis found.   Disposition:   F/U  6 months  No orders of the defined types were placed in this encounter.    Signed, Dossie Arbourim Blanchie Zeleznik, M.D., Ph.D. 01/12/2018  [facility], [place] [phone]

## 2018-01-14 ENCOUNTER — Ambulatory Visit: Payer: Self-pay | Admitting: *Deleted

## 2018-01-14 ENCOUNTER — Ambulatory Visit (INDEPENDENT_AMBULATORY_CARE_PROVIDER_SITE_OTHER): Payer: Medicare HMO | Admitting: Family Medicine

## 2018-01-14 ENCOUNTER — Ambulatory Visit: Payer: Medicare HMO | Admitting: Cardiovascular Disease

## 2018-01-14 ENCOUNTER — Encounter: Payer: Self-pay | Admitting: Family Medicine

## 2018-01-14 DIAGNOSIS — J441 Chronic obstructive pulmonary disease with (acute) exacerbation: Secondary | ICD-10-CM | POA: Diagnosis not present

## 2018-01-14 MED ORDER — AZITHROMYCIN 250 MG PO TABS: ORAL_TABLET | ORAL | 0 refills | Status: DC

## 2018-01-14 MED ORDER — PREDNISONE 20 MG PO TABS: ORAL_TABLET | ORAL | 0 refills | Status: DC

## 2018-01-14 NOTE — Patient Instructions (Signed)
Restart prednisone with food.  Start azithromycin.  If worse then let me know.  Keep using your inhalers.  I'll check with pulmonary.  Take care.  Glad to see you.

## 2018-01-14 NOTE — Telephone Encounter (Signed)
Pt  Has appt with Dr Para Marchuncan today at 3 PM.

## 2018-01-14 NOTE — Progress Notes (Signed)
On baseline inhalers and recently was treated.  She was getting better then worse in the meantime.  More cough, sneezing, sputum, ST, wheezing, more SOB.  Off pred and abx now.  Last abx use was about 5 days ago.    Meds, vitals, and allergies reviewed.   ROS: Per HPI unless specifically indicated in ROS section   GEN: nad, alert and oriented, on O2.   HEENT: mucous membranes moist, R nostril with scant amount of bleeding noted-patient has had this happen episodically and she manages this on her own.  Sinuses not tender to palpation x4. OP wnl NECK: supple w/o LA CV: IRR PULM:  Diffuse esp rhonchi and wheeze B, no inc wob ABD: soft, +bs EXT: no edema SKIN: no acute rash

## 2018-01-14 NOTE — Telephone Encounter (Signed)
Patient was treated with antibiotic and prednisone a 10 days ago. She finished the medication a few days ago. She feels like she was getting better at first- but at the end of the treatment- she started feeling like the congestion was getting worse again. She is coughing and feels like she is wheezing and has chest tightness present.  Reason for Disposition . Cough has been present for > 3 weeks  Answer Assessment - Initial Assessment Questions 1. ONSET: "When did the cough begin?"      1 month- she was doing better moving congestion up- but now she is not able to move congestion 2. SEVERITY: "How bad is the cough today?"      Patient is coughing when she feels she has something in chest- she is on O2 3. RESPIRATORY DISTRESS: "Describe your breathing."      She is breathing well as long as she is not moving around 4. FEVER: "Do you have a fever?" If so, ask: "What is your temperature, how was it measured, and when did it start?"     no 5. HEMOPTYSIS: "Are you coughing up any blood?" If so ask: "How much?" (flecks, streaks, tablespoons, etc.)     no 6. TREATMENT: "What have you done so far to treat the cough?" (e.g., meds, fluids, humidifier)     No- patient is not using any other medications 7. CARDIAC HISTORY: "Do you have any history of heart disease?" (e.g., heart attack, congestive heart failure)      irregular heart beat 8. LUNG HISTORY: "Do you have any history of lung disease?"  (e.g., pulmonary embolus, asthma, emphysema)     COPD, emphysema 9. PE RISK FACTORS: "Do you have a history of blood clots?" (or: recent major surgery, recent prolonged travel, bedridden )     Patient is on blood thinner 10. OTHER SYMPTOMS: "Do you have any other symptoms? (e.g., runny nose, wheezing, chest pain)       wheezing this morning, chest tightness because she can't move congestion  11. PREGNANCY: "Is there any chance you are pregnant?" "When was your last menstrual period?"       n/a 12. TRAVEL:  "Have you traveled out of the country in the last month?" (e.g., travel history, exposures)       no  Protocols used: COUGH - ACUTE NON-PRODUCTIVE-A-AH

## 2018-01-15 ENCOUNTER — Telehealth: Payer: Self-pay

## 2018-01-15 NOTE — Assessment & Plan Note (Signed)
Patient appears to be again worsening.  At this point still okay for outpatient follow-up.  Restart prednisone with routine cautions.  Start Zithromax.  Update me as needed.  Continue baseline medications.  I will route this note to pulmonary.  Would like input from pulmonary re: other options to hopefully improve her long-term control.  Appreciate the help of all involved.

## 2018-01-15 NOTE — Telephone Encounter (Signed)
Copied from CRM [phone]. Topic: General - Other >> Jan 15, 2018  2:46 PM Stephannie LiSimmons, Janett L, NT wrote: Reason for CRM: Patient wants th M.D to know she is better and spitting up yellow ,

## 2018-01-15 NOTE — Telephone Encounter (Signed)
Noted, thanks!

## 2018-01-29 DIAGNOSIS — J441 Chronic obstructive pulmonary disease with (acute) exacerbation: Secondary | ICD-10-CM | POA: Diagnosis not present

## 2018-02-13 NOTE — Progress Notes (Signed)
Cardiology Office Note  Date:  02/16/2018   ID:  Dominique Clark, DOB [birth year], MRN [id number]  PCP:  [name], MD   Chief Complaint  Patient presents with  . other    NEW PT. referred by Dr. Para March r/t  AFIB/ED TIA. Pt c/o sob, cp at times;  Reviewed meds with pt verbally.    HPI:  Dominique Clark is a 77 year old woman with past medical history of COPD, on oxygen Atrial fib TIA CKD In the hospital 11/28/2017 presented with complains of left arm numbness along with slurred speech.  Upon arrival in the emergency department EKG shows atrial fibrillation with RVR. She presents to establish care in the [place] office by referral from Dr. Para March for her paroxysmal atrial fibrillation, TIA  COPD exacrebation, TIA 11/27/2017 Carotid <39% b/l  Echo EF 55 to 65, gd II diastolic dysfunction Mild to mod MR, MOD TR, LA normal size right  symptoms resolved completely.  MRI brain did not show any acute infarct.  She converted on her own to sinus rhythm.   Chads2vasc: at least 5. her sats to 87% with ambulation   In follow-up today she is bothered by the Lipitor 80 Reports having general achiness, would like to use a lower dose  Discharged on Eliquis 2.5 mg twice a day At the time had elevated creatinine Since then renal function has normalized, she is under 11 years old and above 60 kg   Biggest problem is COPD, walking with 2 L nasal cannula Uses oxygen tank Would like to be on chronic low-dose steroids Has follow-up with pulmonary  Reports having previous episodes of tachycardia at home Typically able to measure this using her pulse oximeter She has seen rates up to 140, 150 When she has atrial fibrillation she has worsening shortness of breath  EKG November 27, 2017 reviewed personally by myself Showing atrial fibrillation with ventricular rate 131 bpm  EKG personally reviewed by myself on todays visit Shows normal sinus rhythm with rate 81 bpm no significant ST or T  wave changes   PMH:   has a past medical history of AF (atrial fibrillation) (HCC), Asthma, COPD (chronic obstructive pulmonary disease) (HCC), Diabetes mellitus, Diverticulosis, Gastritis, Hiatal hernia, Osteopenia, Stress incontinence, female, and TIA (transient ischemic attack).  PSH:    Past Surgical History:  Procedure Laterality Date  . BLADDER REPAIR  9/04   bladder tack   . BREAST CYST EXCISION Right 1972  . TONSILLECTOMY     age 48  . TOTAL ABDOMINAL HYSTERECTOMY      Current Outpatient Medications  Medication Sig Dispense Refill  . apixaban (ELIQUIS) 5 MG TABS tablet Take 1 tablet (5 mg total) by mouth 2 (two) times daily. 180 tablet 3  . budesonide-formoterol (SYMBICORT) 160-4.5 MCG/ACT inhaler INHALE 2 PUFFS EVERY 12 HOURS TO PREVENTCOUGH OR WHEEZING--RINSE, GARGLE, & SPITAFTER EACH USE. (Patient taking differently: Inhale 2 puffs into the lungs every 12 (twelve) hours. TO PREVENT COUGH OR WHEEZING--RINSE, GARGLE, & SPIT AFTER EACH USE.) 3 Inhaler 3  . cholecalciferol (VITAMIN D) 1000 units tablet Take 1,000 Units by mouth daily.    . Esomeprazole Magnesium (NEXIUM PO) Take 1 capsule by mouth daily.     . fluticasone (FLONASE) 50 MCG/ACT nasal spray Place 2 sprays into both nostrils daily as needed (seasonal allergies).    . meloxicam (MOBIC) 15 MG tablet Take 0.5 tablets (7.5 mg total) daily by mouth. 45 tablet 1  . metoprolol succinate (TOPROL-XL) 25 MG 24 hr  tablet Take 1 tablet (25 mg total) by mouth daily. 30 tablet 12  . traMADol (ULTRAM) 50 MG tablet TAKE 1 TO 2 TABLETS BY MOUTH EVERY 8 HOURS AS NEEDED FOR PAIN. (Patient taking differently: Take 50-100 mg by mouth See admin instructions. Take 2 tablets (100 mg) by mouth every morning and take 1 or 2 tablets (50-100 mg) at night - for arthritis pain) 540 tablet 1  . vitamin C (ASCORBIC ACID) 500 MG tablet Take 1,000 mg by mouth 2 (two) times daily.    Marland Kitchen albuterol (PROVENTIL HFA;VENTOLIN HFA) 108 (90 Base) MCG/ACT inhaler  Inhale 1-2 puffs into the lungs every 4 (four) hours as needed for wheezing. Okay to fill with ventolin or proventil if needed. 3 Inhaler 3  . atorvastatin (LIPITOR) 20 MG tablet Take 1 tablet (20 mg total) by mouth daily. 90 tablet 3  . diltiazem (CARDIZEM) 30 MG tablet Take 1 tablet (30 mg total) by mouth 3 (three) times daily as needed. 270 tablet 1   No current facility-administered medications for this visit.      Allergies:   Doxycycline   Social History:  The patient  reports that she quit smoking about 29 years ago. Her smoking use included cigarettes. she has never used smokeless tobacco. She reports that she does not drink alcohol or use drugs.   Family History:   family history includes Stroke in her mother.    Review of Systems: Review of Systems  Constitutional: Negative.   Respiratory: Positive for shortness of breath.   Cardiovascular: Positive for palpitations.       Tachycardia  Gastrointestinal: Negative.   Musculoskeletal: Negative.   Neurological: Negative.   Psychiatric/Behavioral: Negative.   All other systems reviewed and are negative.    PHYSICAL EXAM: VS:  BP 132/66 (BP Location: Right Arm, Patient Position: Sitting, Cuff Size: Normal)   Pulse 81   Ht 5\' 5"  (1.651 m)   Wt 136 lb (61.7 kg)   BMI 22.63 kg/m  , BMI Body mass index is 22.63 kg/m. GEN: Well nourished, well developed, in no acute distress  HEENT: normal  Neck: no JVD, carotid bruits, or masses Cardiac: RRR; no murmurs, rubs, or gallops,no edema  Respiratory: Expiratory wheezing, moderately decreased breath sounds throughout, normal work of breathing GI: soft, nontender, nondistended, + BS MS: no deformity or atrophy  Skin: warm and dry, no rash Neuro:  Strength and sensation are intact Psych: euthymic mood, full affect    Recent Labs: 11/27/2017: ALT 12; B Natriuretic Peptide 214.4; TSH 0.779 12/01/2017: Hemoglobin 11.4; Platelets 262.0 12/10/2017: BUN 13; Creatinine, Ser 1.14;  Potassium 4.9; Sodium 139    Lipid Panel Lab Results  Component Value Date   CHOL 144 11/28/2017   HDL 46 11/28/2017   LDLCALC 83 11/28/2017   TRIG 75 11/28/2017      Wt Readings from Last 3 Encounters:  02/16/18 136 lb (61.7 kg)  01/14/18 134 lb 4 oz (60.9 kg)  01/01/18 130 lb (59 kg)       ASSESSMENT AND PLAN:  Atrial fibrillation with RVR (HCC) - Plan: EKG 12-Lead Paroxysmal, elevated CHADS VASC Recent hospital records reviewed Eliquis doses inappropriate given normal renal function We will increase Eliquis up to 5 twice daily We will need to monitor renal function in the future She is under 40 years old, over 60 kg Recommend she take diltiazem 30 mg pills 1-2 as needed for paroxysmal atrial fibrillation She will call us if she has episodes She will use  pulse oximeter to measure  TIA (transient ischemic attack) Recent TIA likely secondary to paroxysmal atrial fibrillation Minimal carotid stenosis  Essential hypertension Blood pressure is well controlled on today's visit. No changes made to the medications.  COPD with acute exacerbation (HCC) Recent COPD exacerbation, difficult time November 2018 Has follow-up with pulmonary  Disposition:   F/U  6 months   Total encounter time more than 60 minutes  Greater than 50% was spent in counseling and coordination of care with the patient    Orders Placed This Encounter  Procedures  . EKG 12-Lead     Signed, Dossie Arbourim Cynthia Cogle, M.D., Ph.D. 02/16/2018  [facility] [place] [phone]

## 2018-02-16 ENCOUNTER — Ambulatory Visit: Payer: Medicare HMO | Admitting: Cardiovascular Disease

## 2018-02-16 ENCOUNTER — Encounter: Payer: Self-pay | Admitting: Cardiovascular Disease

## 2018-02-16 VITALS — BP 132/66 | HR 81 | Ht 65.0 in | Wt 136.0 lb

## 2018-02-16 DIAGNOSIS — J441 Chronic obstructive pulmonary disease with (acute) exacerbation: Secondary | ICD-10-CM

## 2018-02-16 DIAGNOSIS — I10 Essential (primary) hypertension: Secondary | ICD-10-CM | POA: Diagnosis not present

## 2018-02-16 DIAGNOSIS — I4891 Unspecified atrial fibrillation: Secondary | ICD-10-CM | POA: Diagnosis not present

## 2018-02-16 DIAGNOSIS — G459 Transient cerebral ischemic attack, unspecified: Secondary | ICD-10-CM | POA: Diagnosis not present

## 2018-02-16 MED ORDER — DILTIAZEM HCL 30 MG PO TABS: 30.0000 mg | ORAL_TABLET | Freq: Three times a day (TID) | ORAL | 1 refills | Status: DC | PRN

## 2018-02-16 MED ORDER — APIXABAN 5 MG PO TABS: 5.0000 mg | ORAL_TABLET | Freq: Two times a day (BID) | ORAL | 3 refills | Status: DC

## 2018-02-16 MED ORDER — ATORVASTATIN CALCIUM 20 MG PO TABS: 20.0000 mg | ORAL_TABLET | Freq: Every day | ORAL | 3 refills | Status: DC

## 2018-02-16 NOTE — Patient Instructions (Addendum)
Medication Instructions:   Please decrease the lipitor down to 20 mg daily Take in the morning if you want  Eliquis 5 mg twice a day  Medication Samples have been provided to the patient.  Drug name: Eliquis       Strength: 5 mg        Qty: 2 boxes  LOT: [id number]  Exp.Date: 3/21   Take diltiazem 30 mg pill 1 to 2 pills as needed for atrial fibrillation  Labwork:  No new labs needed  Testing/Procedures:  No further testing at this time   Follow-Up: It was a pleasure seeing you in the office today. Please call us if you have new issues that need to be addressed before your next appt.  [phone]  Your physician wants you to follow-up in: 6 months.  You will receive a reminder letter in the mail two months in advance. If you don't receive a letter, please call our office to schedule the follow-up appointment.  If you need a refill on your cardiac medications before your next appointment, please call your pharmacy.

## 2018-02-17 ENCOUNTER — Telehealth: Payer: Self-pay | Admitting: Cardiovascular Disease

## 2018-02-17 NOTE — Telephone Encounter (Addendum)
Spoke with pharmacist to clarify that patient was ordered diltiazem 30 mg three times daily as needed. Read back performed with pharmacist and no further questions.

## 2018-02-26 DIAGNOSIS — J441 Chronic obstructive pulmonary disease with (acute) exacerbation: Secondary | ICD-10-CM | POA: Diagnosis not present

## 2018-03-04 ENCOUNTER — Ambulatory Visit (INDEPENDENT_AMBULATORY_CARE_PROVIDER_SITE_OTHER): Payer: Medicare HMO | Admitting: Internal Medicine

## 2018-03-04 ENCOUNTER — Encounter: Payer: Self-pay | Admitting: Internal Medicine

## 2018-03-04 VITALS — BP 100/80 | HR 92 | Resp 16 | Ht 65.0 in | Wt 138.0 lb

## 2018-03-04 DIAGNOSIS — J441 Chronic obstructive pulmonary disease with (acute) exacerbation: Secondary | ICD-10-CM | POA: Diagnosis not present

## 2018-03-04 DIAGNOSIS — J449 Chronic obstructive pulmonary disease, unspecified: Secondary | ICD-10-CM

## 2018-03-04 MED ORDER — IPRATROPIUM-ALBUTEROL 0.5-2.5 (3) MG/3ML IN SOLN: 3.0000 mL | RESPIRATORY_TRACT | 5 refills | Status: DC | PRN

## 2018-03-04 MED ORDER — PREDNISONE 10 MG (21) PO TBPK: ORAL_TABLET | ORAL | 0 refills | Status: DC

## 2018-03-04 MED ORDER — AZITHROMYCIN 250 MG PO TABS: 250.0000 mg | ORAL_TABLET | Freq: Every day | ORAL | 5 refills | Status: DC

## 2018-03-04 MED ORDER — PREDNISONE 10 MG (21) PO TBPK
ORAL_TABLET | ORAL | 0 refills | Status: DC
Start: 2018-03-04 — End: 2018-03-04

## 2018-03-04 MED ORDER — IPRATROPIUM-ALBUTEROL 0.5-2.5 (3) MG/3ML IN SOLN: 3.0000 mL | RESPIRATORY_TRACT | 0 refills | Status: DC | PRN

## 2018-03-04 NOTE — Patient Instructions (Addendum)
Start nebulizer every 4 hour as needed.  Start prednisone 6 day taper.  Start azithromycin every day.

## 2018-03-04 NOTE — Progress Notes (Signed)
[facility] Pulmonary Medicine Consultation      Assessment and Plan:  Severe emphysema with dyspnea on exertion and chronic hypoxic respiratory failure-GroupD.  Now with acute exacerbation of COPD with acute on chronic respiratory failure. -Patient is currently on Symbicort, did not tolerate Spiriva due to cough, Trelegy inhaler was too expensive. -Given persistent symptoms now with acute exacerbation, will start the patient on nebulized duo nebs every 4 hours as needed. - 6-day prednisone taper. -Patient has asked about the possibility of being on long-term steroids, given the negative side effects I explained I would prefer to try her on long-term azithromycin first, if this is not helpful in terms of preventing further exacerbations we could again consider long-term low-dose steroids. -We discussed referral to pulmonary rehab, she has completed physical therapy which was helpful.  However she is not interested in starting pulmonary rehab, as she feels it would be too taxing and hard for her. -Continue ambulatory oxygen. - Given her severe emphysema and smoking cessation greater than 20 years ago, would not consider CT lung cancer screening.  Orders Placed This Encounter  Procedures  . Ambulatory Referral for DME   Meds ordered this encounter  Medications  . DISCONTD: predniSONE (STERAPRED UNI-PAK 21 TAB) 10 MG (21) TBPK tablet    Sig: Take as directed.    Dispense:  21 tablet    Refill:  0  . DISCONTD: azithromycin (ZITHROMAX) 250 MG tablet    Sig: Take 1 tablet (250 mg total) by mouth daily.    Dispense:  30 tablet    Refill:  5  . ipratropium-albuterol (DUONEB) 0.5-2.5 (3) MG/3ML SOLN    Sig: Take 3 mLs by nebulization every 4 (four) hours as needed.    Dispense:  360 mL    Refill:  5  . azithromycin (ZITHROMAX) 250 MG tablet    Sig: Take 1 tablet (250 mg total) by mouth daily.    Dispense:  30 tablet    Refill:  5  . predniSONE (STERAPRED UNI-PAK 21 TAB) 10 MG (21) TBPK  tablet    Sig: Take as directed.    Dispense:  21 tablet    Refill:  0   Return in about 3 months (around 06/04/2018).    Date: 03/04/2018  MRN# [id number] Dominique Clark 08/14/1941   Dominique Clark is a 77 y.o. old female seen in consultation for chief complaint of:    Chief Complaint  Patient presents with  . COPD    pt says she has been feeling bad 4 days. Increased sob, congestion and wheezing with slight chest tightess. Mucus is thick and yellow.    HPI:   The patient is a 77 year old female with COPD, atrial fibrillation with RVR, TIA symptoms.  She is referred here first history of COPD. She is present with a friend today who provides some of the history. She lives herself and she drives a car. She used to be able to go to the grocery store, however over the past 6-12 months she has not been able to get out by herself because she develops coughing and dyspnea.  Last visit it was noted that Spiriva was causing a cough, therefore she was put on Trilogy inhaler, she was also noted to have continued hypoxia with ambulation, therefore she was recommended to continue oxygen and we asked advanced home care for portable oxygen. She could not do Trelegy due to cost, so she stayed on symbicort 2 puffs bid and rinses mouth after. She  does ventolin as needed, which was about 3 or 4 times per day. However she has come down with a cold and she has been wheezing, she is now using ventolin 7 or 8 times per day.   She used to smoke 1.5 ppd, until 20 years ago. Smoked for about 25  Years.   12/03/17 Desat walk on RA at rest was 93% and HR 73. Walked 180 feet with sat of 86% and HR 97. Moderate dyspnea, she did not think she could go any further.   Imaging personally reviewed, chest x-ray 11/27/17; severe hyperinflation, and findings consistent with severe emphysema/COPD.  Social Hx:   Social History   Tobacco Use  . Smoking status: Former Smoker    Types: Cigarettes    Last attempt to quit:  12/29/1988    Years since quitting: 29.1  . Smokeless tobacco: Never Used  . Tobacco comment: 20 or more years   Substance Use Topics  . Alcohol use: No    Alcohol/week: 0.0 oz  . Drug use: No   Medication:    Current Outpatient Medications:  .  albuterol (PROVENTIL HFA;VENTOLIN HFA) 108 (90 Base) MCG/ACT inhaler, Inhale 1-2 puffs into the lungs every 4 (four) hours as needed for wheezing. Okay to fill with ventolin or proventil if needed., Disp: 3 Inhaler, Rfl: 3 .  apixaban (ELIQUIS) 5 MG TABS tablet, Take 1 tablet (5 mg total) by mouth 2 (two) times daily., Disp: 180 tablet, Rfl: 3 .  atorvastatin (LIPITOR) 20 MG tablet, Take 1 tablet (20 mg total) by mouth daily., Disp: 90 tablet, Rfl: 3 .  budesonide-formoterol (SYMBICORT) 160-4.5 MCG/ACT inhaler, INHALE 2 PUFFS EVERY 12 HOURS TO PREVENTCOUGH OR WHEEZING--RINSE, GARGLE, & SPITAFTER EACH USE. (Patient taking differently: Inhale 2 puffs into the lungs every 12 (twelve) hours. TO PREVENT COUGH OR WHEEZING--RINSE, GARGLE, & SPIT AFTER EACH USE.), Disp: 3 Inhaler, Rfl: 3 .  cholecalciferol (VITAMIN D) 1000 units tablet, Take 1,000 Units by mouth daily., Disp: , Rfl:  .  diltiazem (CARDIZEM) 30 MG tablet, Take 1 tablet (30 mg total) by mouth 3 (three) times daily as needed., Disp: 270 tablet, Rfl: 1 .  Esomeprazole Magnesium (NEXIUM PO), Take 1 capsule by mouth daily. , Disp: , Rfl:  .  fluticasone (FLONASE) 50 MCG/ACT nasal spray, Place 2 sprays into both nostrils daily as needed (seasonal allergies)., Disp: , Rfl:  .  meloxicam (MOBIC) 15 MG tablet, Take 0.5 tablets (7.5 mg total) daily by mouth., Disp: 45 tablet, Rfl: 1 .  metoprolol succinate (TOPROL-XL) 25 MG 24 hr tablet, Take 1 tablet (25 mg total) by mouth daily., Disp: 30 tablet, Rfl: 12 .  traMADol (ULTRAM) 50 MG tablet, TAKE 1 TO 2 TABLETS BY MOUTH EVERY 8 HOURS AS NEEDED FOR PAIN. (Patient taking differently: Take 50-100 mg by mouth See admin instructions. Take 2 tablets (100 mg) by  mouth every morning and take 1 or 2 tablets (50-100 mg) at night - for arthritis pain), Disp: 540 tablet, Rfl: 1 .  vitamin C (ASCORBIC ACID) 500 MG tablet, Take 1,000 mg by mouth 2 (two) times daily., Disp: , Rfl:    Allergies:  Doxycycline  Review of Systems: Gen:  Denies  fever, sweats, chills HEENT: Denies blurred vision, double vision. bleeds, sore throat Cvc:  No dizziness, chest pain. Resp:   Denies cough or sputum production, shortness of breath Gi: Denies swallowing difficulty, stomach pain. Gu:  Denies bladder incontinence, burning urine Ext:   No Joint pain, stiffness. Skin:  No skin rash,  hives  Endoc:  No polyuria, polydipsia. Psych: No depression, insomnia. Other:  All other systems were reviewed with the patient and were negative other that what is mentioned in the HPI.   Physical Examination:   VS: BP 100/80 (BP Location: Left Arm, Cuff Size: Normal)   Pulse 92   Resp 16   Ht 5\' 5"  (1.651 m)   Wt 138 lb (62.6 kg)   SpO2 98%   BMI 22.96 kg/m   General Appearance: No distress  Neuro:without focal findings,  speech normal,  HEENT: PERRLA, EOM intact.   Pulmonary: Diffuse bilateral scattered expiratory wheezing. CardiovascularNormal S1,S2.  No m/r/g.   Abdomen: Benign, Soft, non-tender. Renal:  No costovertebral tenderness  GU:  No performed at this time. Endoc: No evident thyromegaly, no signs of acromegaly. Skin:   warm, no rashes, no ecchymosis  Extremities: normal, no cyanosis, clubbing.  Other findings:    LABORATORY PANEL:   CBC No results for input(s): WBC, HGB, HCT, PLT in the last 168 hours. ------------------------------------------------------------------------------------------------------------------  Chemistries  No results for input(s): NA, K, CL, CO2, GLUCOSE, BUN, CREATININE, CALCIUM, MG, AST, ALT, ALKPHOS, BILITOT in the last 168 hours.  Invalid input(s):  GFRCGP ------------------------------------------------------------------------------------------------------------------  Cardiac Enzymes No results for input(s): TROPONINI in the last 168 hours. ------------------------------------------------------------  RADIOLOGY:  No results found.     Thank  you for the consultation and for allowing [facility] to assist in the care of your patient. Our recommendations are noted above.  Please contact us if we can be of further service.   Wells Guiles, MD.  Board Certified in Internal Medicine, Pulmonary Medicine, Critical Care Medicine, and Sleep Medicine.  [place] [facility] Office Number: [phone]  Santiago Glad, M.D.  Billy Fischer, M.D  03/04/2018

## 2018-03-08 ENCOUNTER — Telehealth: Payer: Self-pay | Admitting: Internal Medicine

## 2018-03-08 MED ORDER — IPRATROPIUM-ALBUTEROL 0.5-2.5 (3) MG/3ML IN SOLN: 3.0000 mL | RESPIRATORY_TRACT | 1 refills | Status: DC | PRN

## 2018-03-08 NOTE — Telephone Encounter (Signed)
Original Script for Duoneb was [facility] since patient was getting nebulizer thru them. [facility] letting [facility] know they would not be able to fill and she would have to [facility]. [facility]. Nothing further needed.

## 2018-03-08 NOTE — Telephone Encounter (Signed)
°*  STAT* If patient is at the pharmacy, call can be transferred to refill team.   1. Which medications need to be refilled? (please list name of each medication and dose if known) ipratropium-albuterol (DUONEB) 0.5-2.5 (3) MG/3ML SOLN [[id number]][[id number]]   3 ml neb q 4 hrs prn    2. Which pharmacy/location (including street and city if local pharmacy) is medication to be sent to? [facility] mail order   3. Do they need a 30 day or 90 day supply? 90

## 2018-03-22 IMAGING — DX DG KNEE AP/LAT W/ SUNRISE*R*
4 series · 4 of 4 positions shown · non-contrast
Comparison: None in PACs

CLINICAL DATA: Bilateral knee pain.

EXAM:
LEFT KNEE 3 VIEWS; RIGHT KNEE 3 VIEWS

[knee ap]
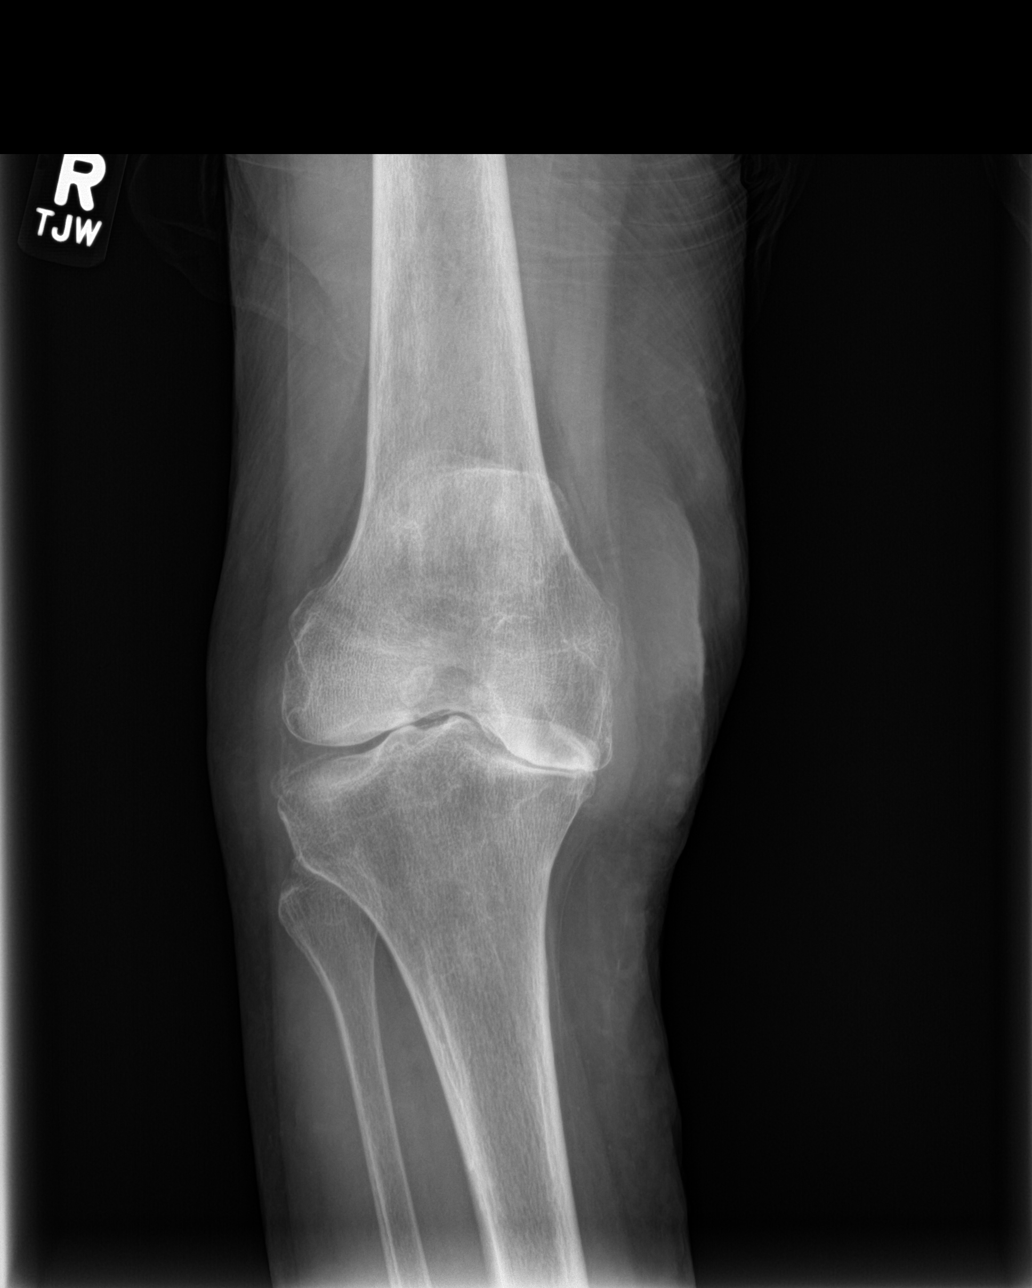

[knee lat]
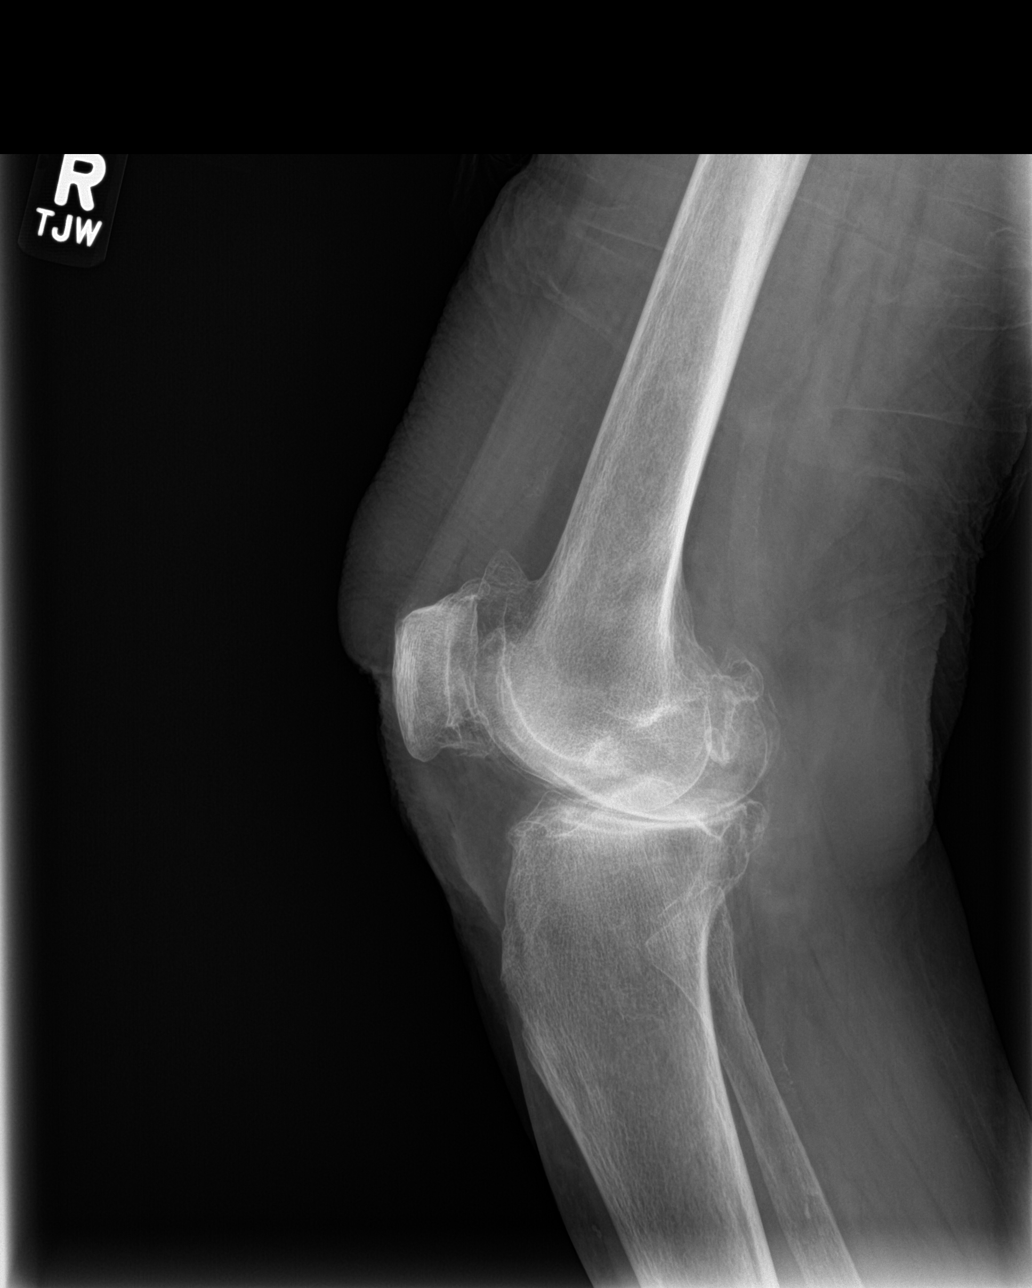

[patella skyline]
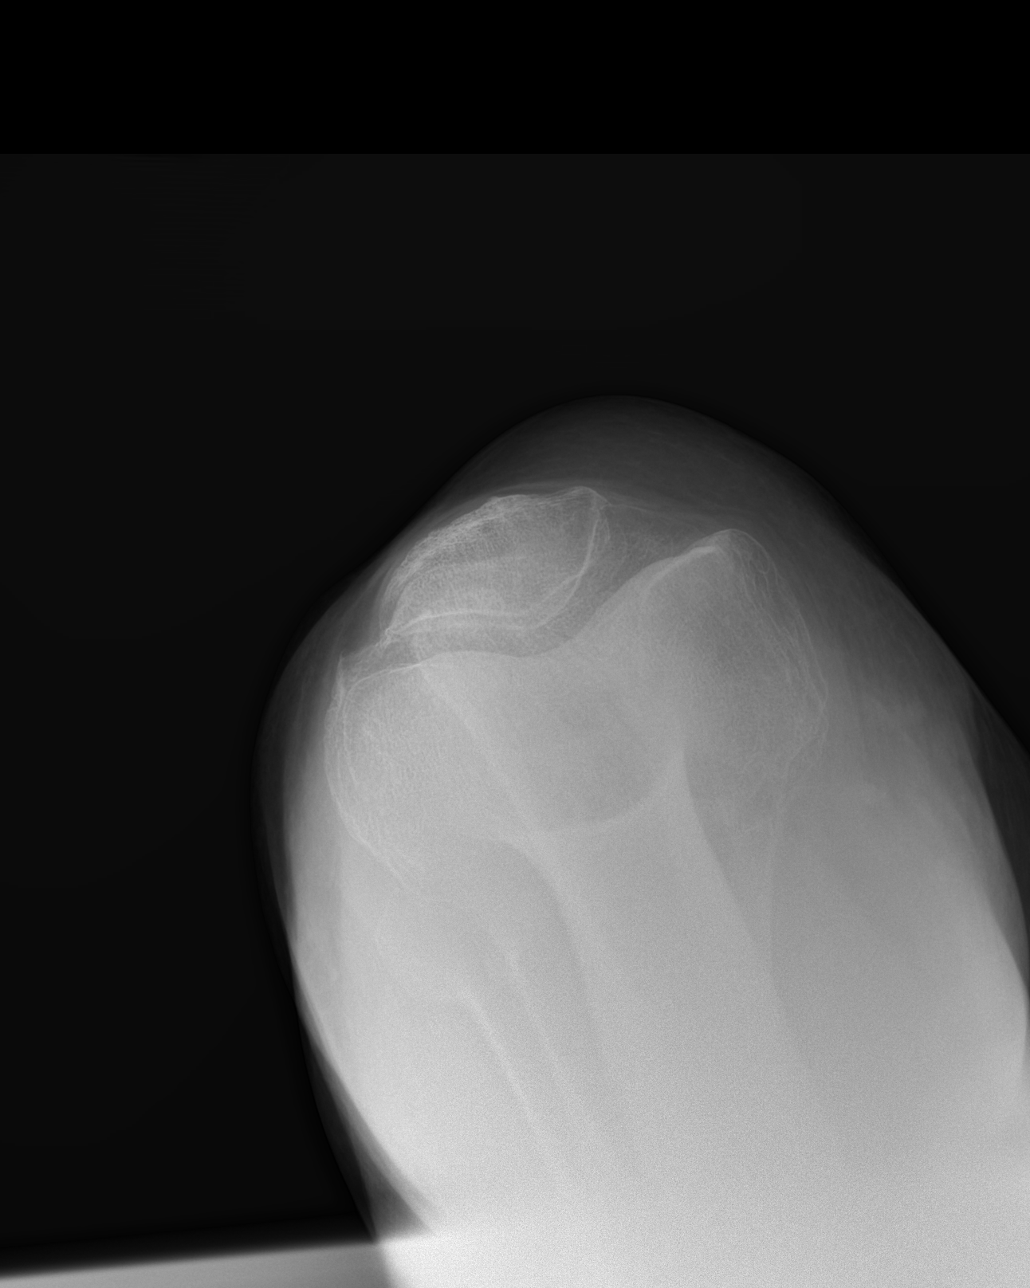

[knee obl]
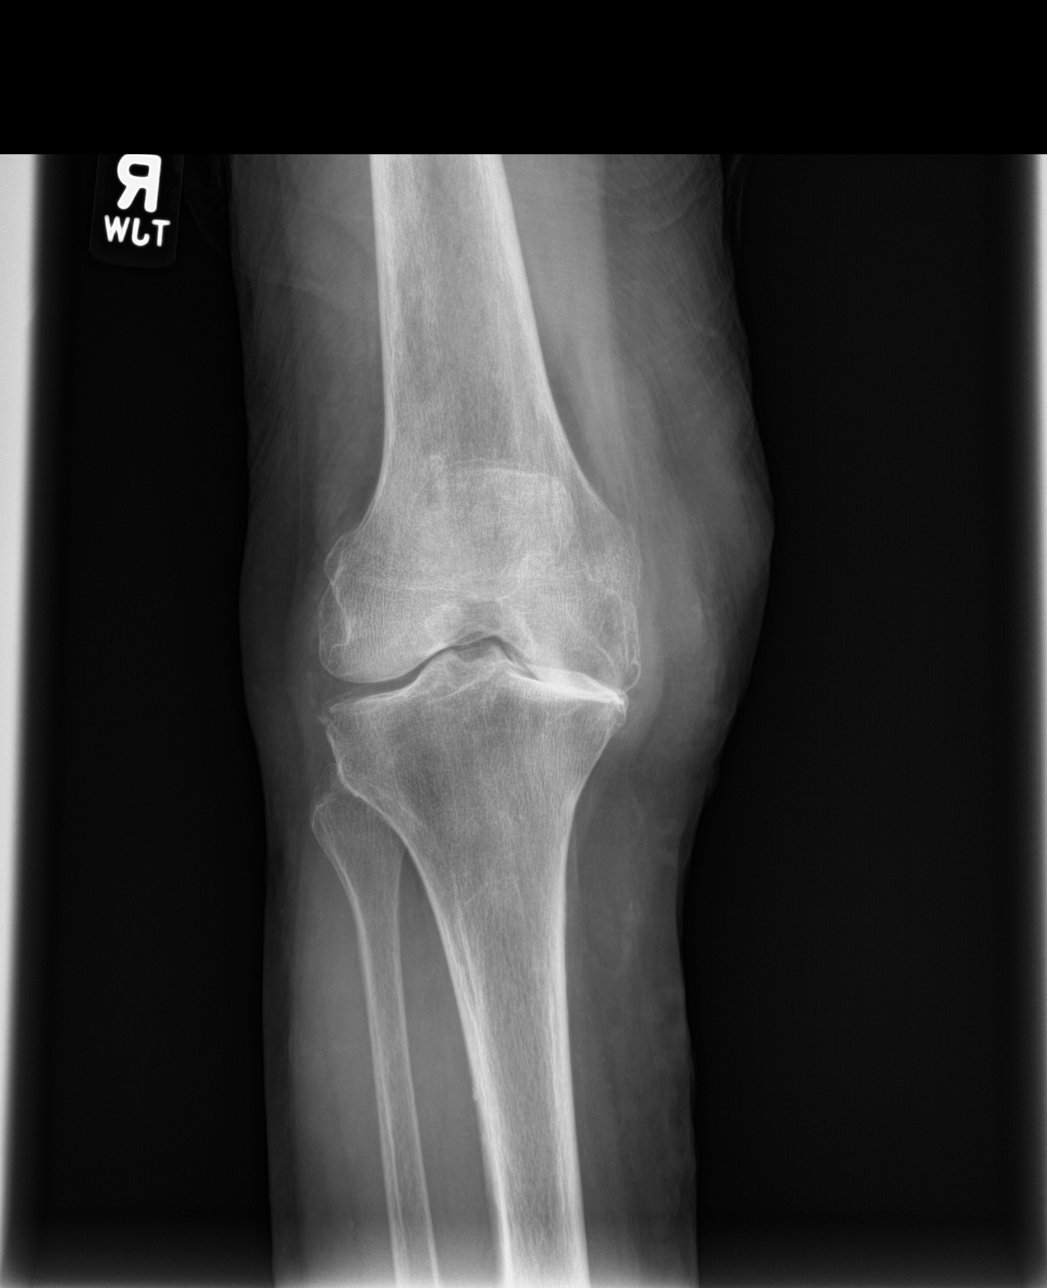

[4 of 4 positions shown; findings below may reference images not displayed]

FINDINGS: Right knee: The bones are subjectively osteopenic. There is
high-grade loss of the joint space of the medial compartment. There
is milder narrowing of the lateral and patellofemoral joint spaces.
There is beaking of the tibial spines. There are exuberant
osteophytes arising from the articular margins of the patella. There
is no acute fracture. There is no definite joint effusion.

Left knee: There is severe joint space loss of the medial
compartment. There is milder loss laterally. There is mild narrowing
of the patellofemoral joint space. Spurs arise from the articular
margins of the patella. There is no acute fracture. There are
vascular calcifications. There is no definite joint effusion.
IMPRESSION: Moderate to severe joint space loss of the medial compartments
bilaterally with milder joint space loss of the lateral and
patellofemoral compartments. Significant osteophyte formation
especially of the patellae. No acute fractures nor dislocation.

## 2018-03-22 IMAGING — DX DG KNEE AP/LAT W/ SUNRISE*L*
4 series · 4 of 4 positions shown · non-contrast
Comparison: None in PACs

CLINICAL DATA: Bilateral knee pain.

EXAM:
LEFT KNEE 3 VIEWS; RIGHT KNEE 3 VIEWS

[knee ap]
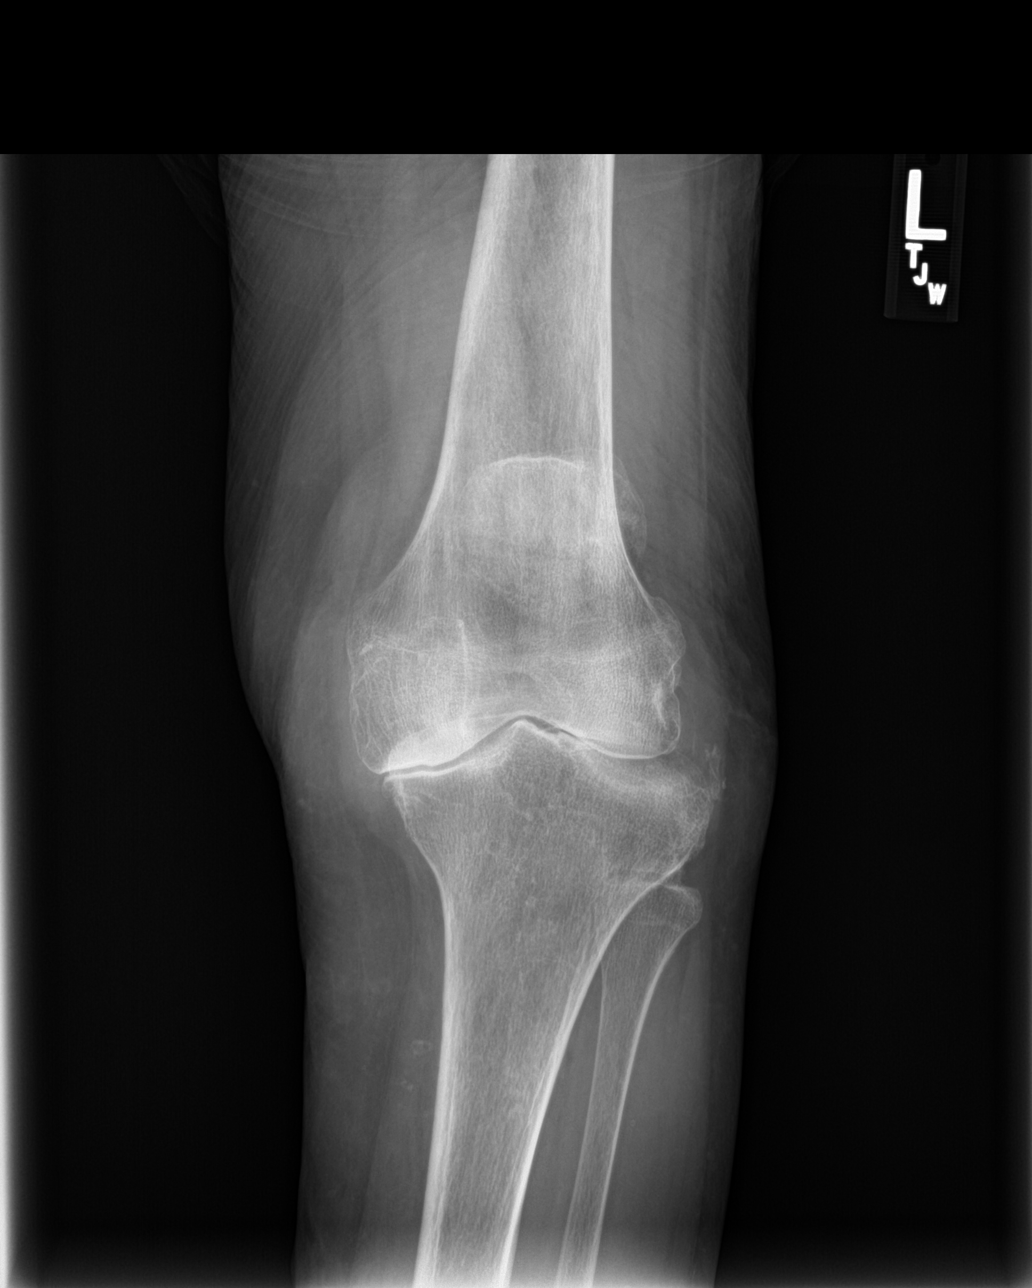

[knee lat]
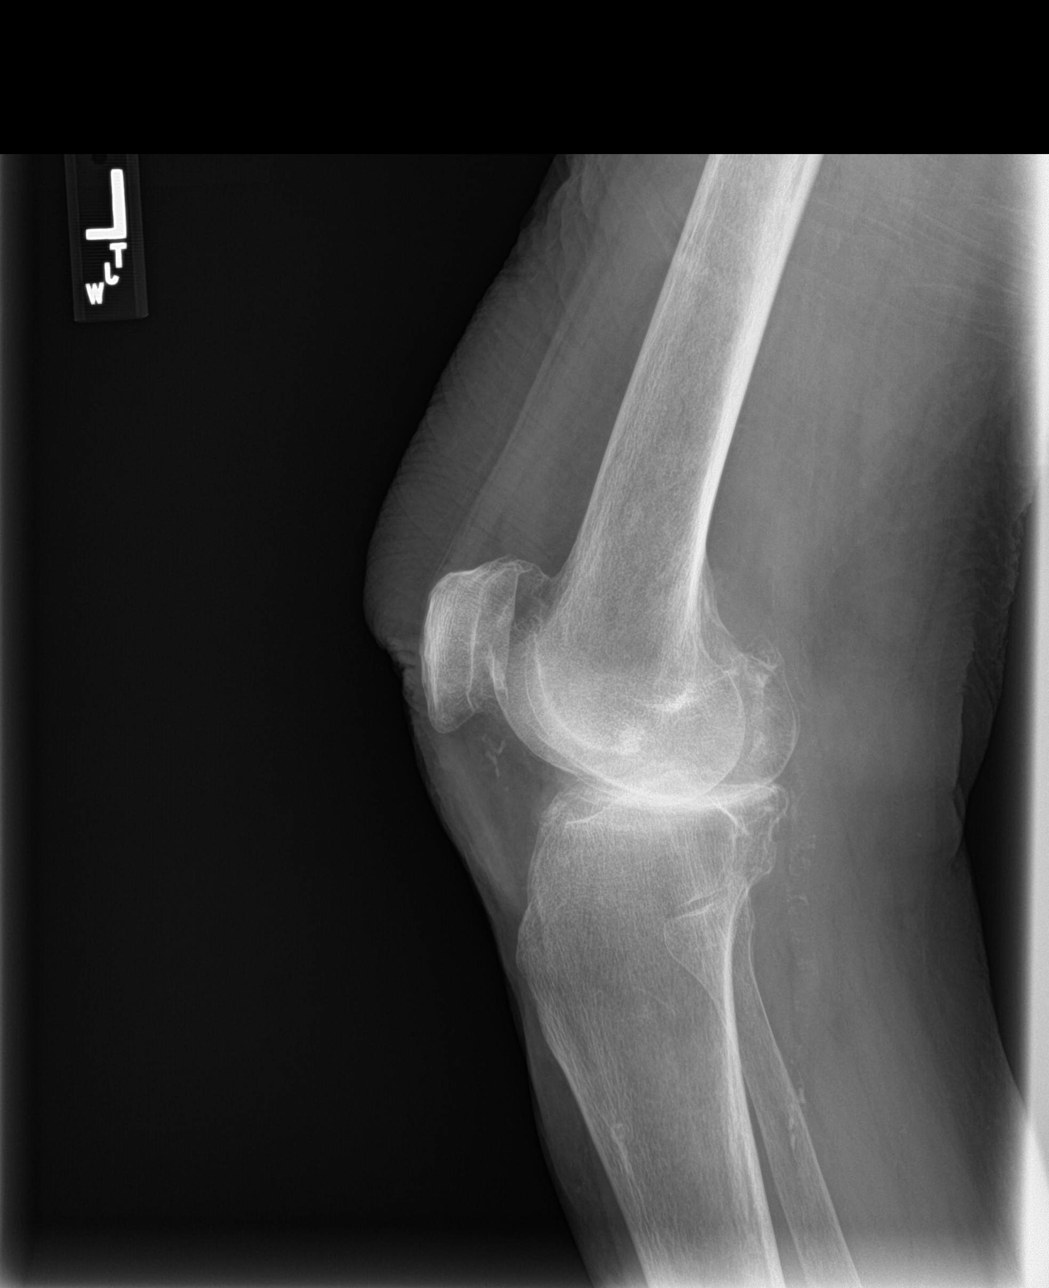

[patella skyline]
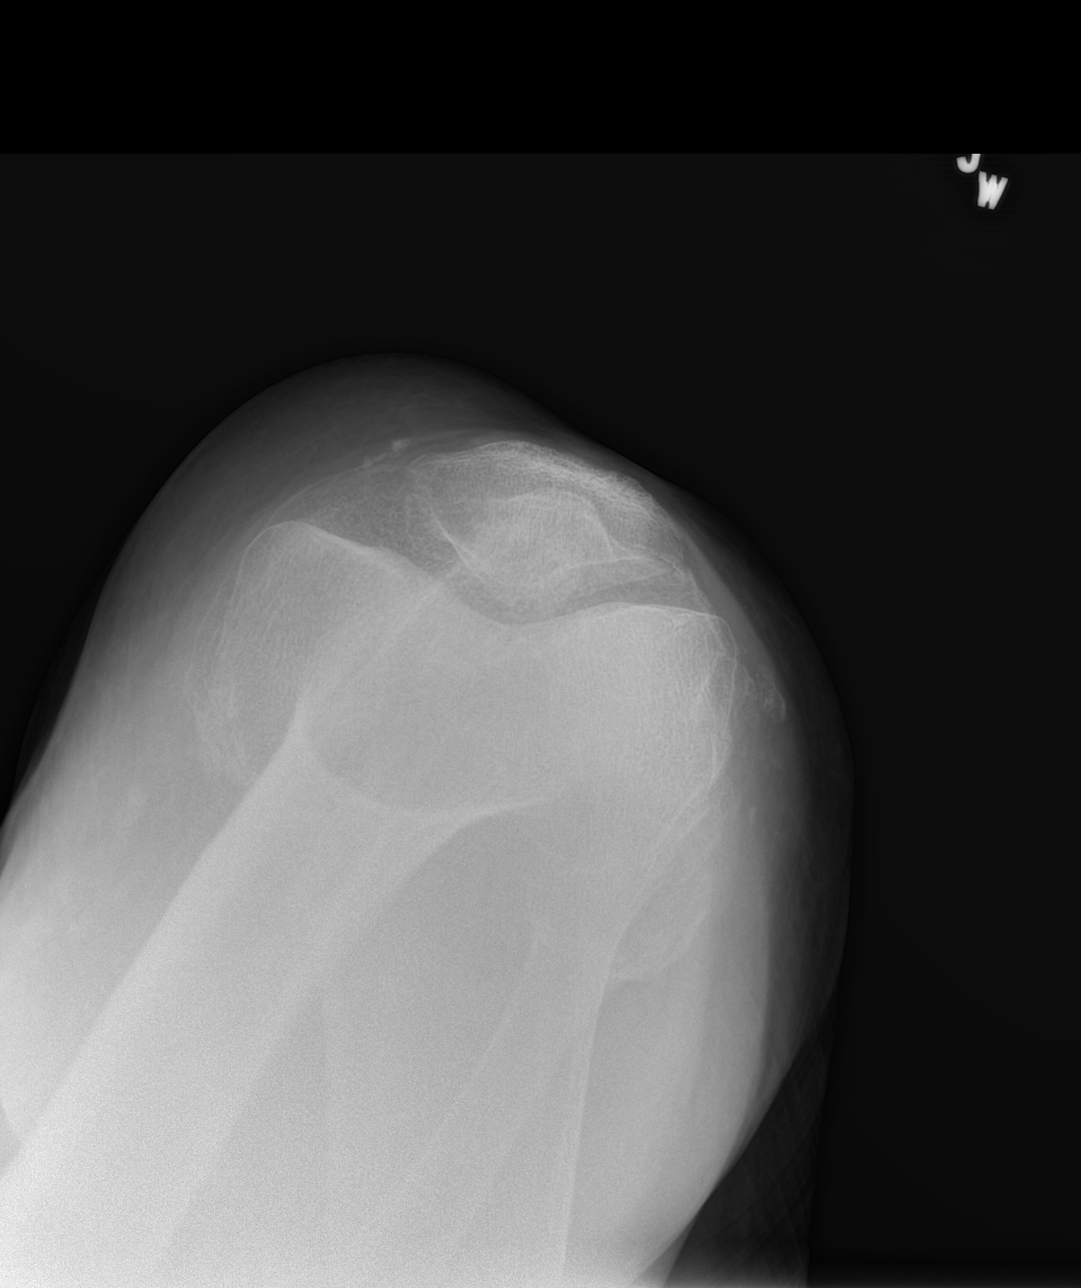

[knee obl]
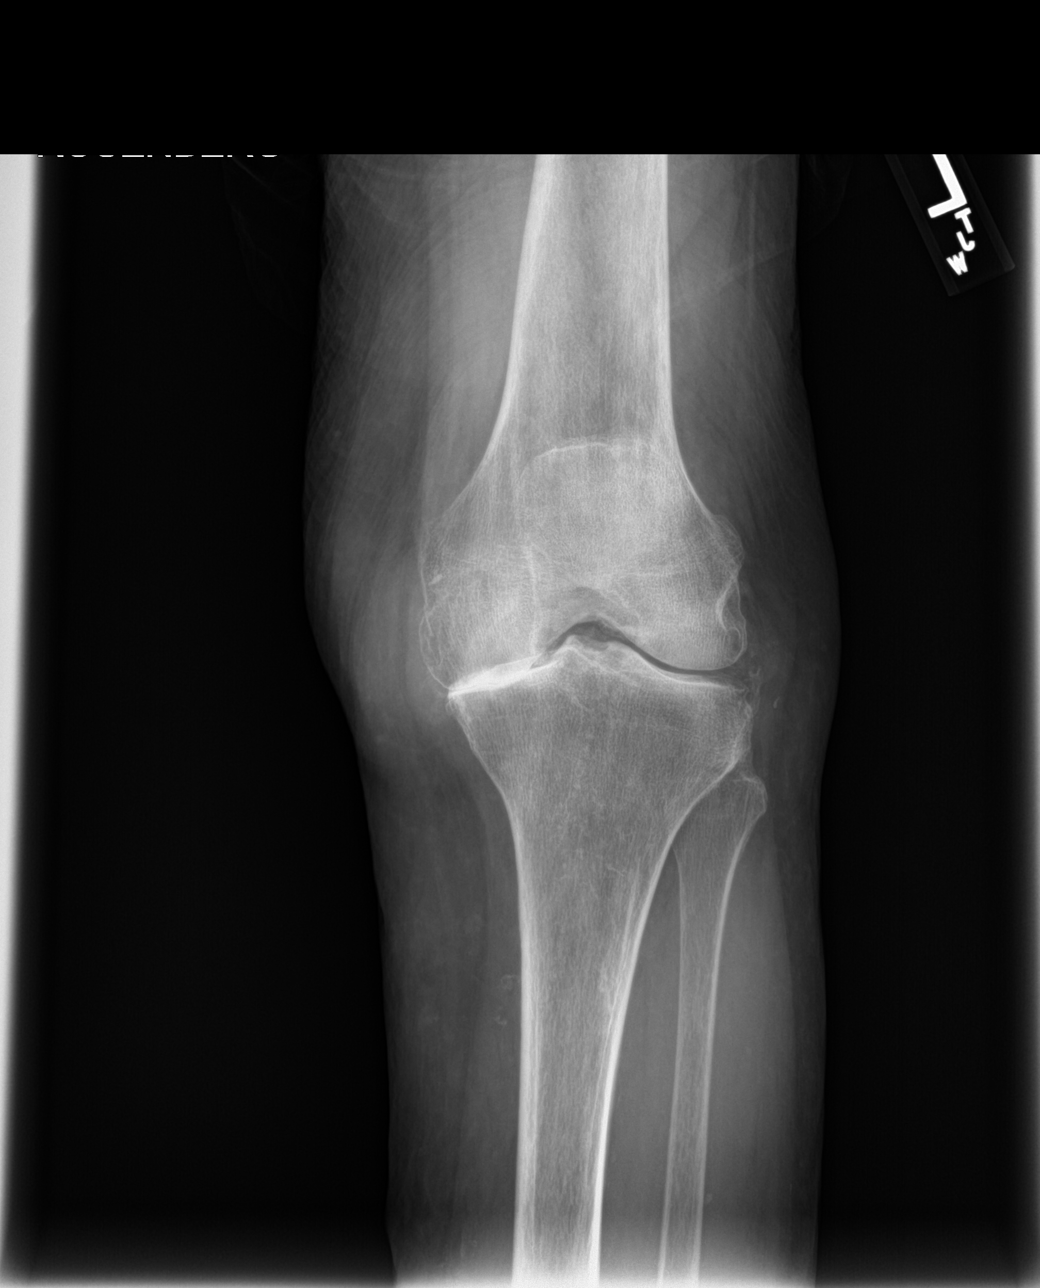

[4 of 4 positions shown; findings below may reference images not displayed]

FINDINGS: Right knee: The bones are subjectively osteopenic. There is
high-grade loss of the joint space of the medial compartment. There
is milder narrowing of the lateral and patellofemoral joint spaces.
There is beaking of the tibial spines. There are exuberant
osteophytes arising from the articular margins of the patella. There
is no acute fracture. There is no definite joint effusion.

Left knee: There is severe joint space loss of the medial
compartment. There is milder loss laterally. There is mild narrowing
of the patellofemoral joint space. Spurs arise from the articular
margins of the patella. There is no acute fracture. There are
vascular calcifications. There is no definite joint effusion.
IMPRESSION: Moderate to severe joint space loss of the medial compartments
bilaterally with milder joint space loss of the lateral and
patellofemoral compartments. Significant osteophyte formation
especially of the patellae. No acute fractures nor dislocation.

## 2018-03-29 DIAGNOSIS — J441 Chronic obstructive pulmonary disease with (acute) exacerbation: Secondary | ICD-10-CM | POA: Diagnosis not present

## 2018-04-04 DIAGNOSIS — J441 Chronic obstructive pulmonary disease with (acute) exacerbation: Secondary | ICD-10-CM | POA: Diagnosis not present

## 2018-04-04 DIAGNOSIS — J449 Chronic obstructive pulmonary disease, unspecified: Secondary | ICD-10-CM | POA: Diagnosis not present

## 2018-04-06 ENCOUNTER — Other Ambulatory Visit: Payer: Self-pay | Admitting: Family Medicine

## 2018-04-07 NOTE — Telephone Encounter (Signed)
Electronic refill request Last refill 11/11/17 #45/1 Last office visit 01/14/18

## 2018-04-08 ENCOUNTER — Encounter: Payer: Self-pay | Admitting: *Deleted

## 2018-04-08 NOTE — Telephone Encounter (Signed)
Letter mailed

## 2018-04-08 NOTE — Telephone Encounter (Signed)
Sent. Thanks.  Reasonable for yearly follow-up/annual medical exam in the summertime.

## 2018-04-19 ENCOUNTER — Telehealth: Payer: Self-pay | Admitting: Family Medicine

## 2018-04-19 NOTE — Telephone Encounter (Signed)
Copied from CRM [phone]. Topic: Quick Communication - Rx Refill/Question >> Apr 19, 2018 12:39 PM Alexander BergeronBarksdale, Harvey B wrote: Pt would like a month supply until her next appt, call pt if needed  Medication: metoprolol succinate (TOPROL-XL) 25 MG 24 hr tablet [[id number]][[id number]]  Has the patient contacted their pharmacy? Yes.   (Agent: If no, request that the patient contact the pharmacy for the refill.) Preferred Pharmacy (with phone number or street name): [facility] Agent: Please be advised that RX refills may take up to 3 business days. We ask that you follow-up with your pharmacy.

## 2018-04-20 ENCOUNTER — Other Ambulatory Visit: Payer: Self-pay | Admitting: *Deleted

## 2018-04-20 MED ORDER — METOPROLOL SUCCINATE ER 25 MG PO TB24: 25.0000 mg | ORAL_TABLET | Freq: Every day | ORAL | 7 refills | Status: DC

## 2018-04-20 NOTE — Telephone Encounter (Signed)
[facility]

## 2018-04-28 DIAGNOSIS — J441 Chronic obstructive pulmonary disease with (acute) exacerbation: Secondary | ICD-10-CM | POA: Diagnosis not present

## 2018-05-04 DIAGNOSIS — J441 Chronic obstructive pulmonary disease with (acute) exacerbation: Secondary | ICD-10-CM | POA: Diagnosis not present

## 2018-05-04 DIAGNOSIS — J449 Chronic obstructive pulmonary disease, unspecified: Secondary | ICD-10-CM | POA: Diagnosis not present

## 2018-05-12 ENCOUNTER — Inpatient Hospital Stay (HOSPITAL_COMMUNITY)
Admission: EM | Admit: 2018-05-12 | Discharge: 2018-05-20 | DRG: 190 | Disposition: A | Discharge status: Home Health | Payer: Medicare HMO | Attending: Internal Medicine | Admitting: Internal Medicine

## 2018-05-12 ENCOUNTER — Encounter (HOSPITAL_COMMUNITY): Payer: Self-pay | Admitting: Emergency Medicine

## 2018-05-12 ENCOUNTER — Emergency Department (HOSPITAL_COMMUNITY): Payer: Medicare HMO

## 2018-05-12 DIAGNOSIS — J9621 Acute and chronic respiratory failure with hypoxia: Secondary | ICD-10-CM | POA: Diagnosis present

## 2018-05-12 DIAGNOSIS — B9789 Other viral agents as the cause of diseases classified elsewhere: Secondary | ICD-10-CM | POA: Diagnosis present

## 2018-05-12 DIAGNOSIS — I4891 Unspecified atrial fibrillation: Secondary | ICD-10-CM | POA: Diagnosis present

## 2018-05-12 DIAGNOSIS — R269 Unspecified abnormalities of gait and mobility: Secondary | ICD-10-CM | POA: Diagnosis present

## 2018-05-12 DIAGNOSIS — T380X5A Adverse effect of glucocorticoids and synthetic analogues, initial encounter: Secondary | ICD-10-CM | POA: Diagnosis not present

## 2018-05-12 DIAGNOSIS — J441 Chronic obstructive pulmonary disease with (acute) exacerbation: Secondary | ICD-10-CM | POA: Diagnosis present

## 2018-05-12 DIAGNOSIS — N183 Chronic kidney disease, stage 3 unspecified: Secondary | ICD-10-CM | POA: Diagnosis present

## 2018-05-12 DIAGNOSIS — R5381 Other malaise: Secondary | ICD-10-CM | POA: Diagnosis present

## 2018-05-12 DIAGNOSIS — D72829 Elevated white blood cell count, unspecified: Secondary | ICD-10-CM | POA: Diagnosis not present

## 2018-05-12 DIAGNOSIS — E1165 Type 2 diabetes mellitus with hyperglycemia: Secondary | ICD-10-CM | POA: Diagnosis present

## 2018-05-12 DIAGNOSIS — J069 Acute upper respiratory infection, unspecified: Secondary | ICD-10-CM | POA: Diagnosis present

## 2018-05-12 DIAGNOSIS — R000 Tachycardia, unspecified: Secondary | ICD-10-CM | POA: Diagnosis not present

## 2018-05-12 DIAGNOSIS — Z794 Long term (current) use of insulin: Secondary | ICD-10-CM | POA: Diagnosis not present

## 2018-05-12 DIAGNOSIS — Z881 Allergy status to other antibiotic agents status: Secondary | ICD-10-CM

## 2018-05-12 DIAGNOSIS — E1122 Type 2 diabetes mellitus with diabetic chronic kidney disease: Secondary | ICD-10-CM | POA: Diagnosis present

## 2018-05-12 DIAGNOSIS — I10 Essential (primary) hypertension: Secondary | ICD-10-CM | POA: Diagnosis not present

## 2018-05-12 DIAGNOSIS — Z87891 Personal history of nicotine dependence: Secondary | ICD-10-CM | POA: Diagnosis not present

## 2018-05-12 DIAGNOSIS — N179 Acute kidney failure, unspecified: Secondary | ICD-10-CM | POA: Diagnosis present

## 2018-05-12 DIAGNOSIS — G459 Transient cerebral ischemic attack, unspecified: Secondary | ICD-10-CM | POA: Diagnosis not present

## 2018-05-12 DIAGNOSIS — Z823 Family history of stroke: Secondary | ICD-10-CM

## 2018-05-12 DIAGNOSIS — Z791 Long term (current) use of non-steroidal anti-inflammatories (NSAID): Secondary | ICD-10-CM | POA: Diagnosis not present

## 2018-05-12 DIAGNOSIS — K219 Gastro-esophageal reflux disease without esophagitis: Secondary | ICD-10-CM | POA: Diagnosis present

## 2018-05-12 DIAGNOSIS — R0600 Dyspnea, unspecified: Secondary | ICD-10-CM

## 2018-05-12 DIAGNOSIS — Z7951 Long term (current) use of inhaled steroids: Secondary | ICD-10-CM | POA: Diagnosis not present

## 2018-05-12 DIAGNOSIS — Z8673 Personal history of transient ischemic attack (TIA), and cerebral infarction without residual deficits: Secondary | ICD-10-CM | POA: Diagnosis not present

## 2018-05-12 DIAGNOSIS — E785 Hyperlipidemia, unspecified: Secondary | ICD-10-CM | POA: Diagnosis present

## 2018-05-12 DIAGNOSIS — I129 Hypertensive chronic kidney disease with stage 1 through stage 4 chronic kidney disease, or unspecified chronic kidney disease: Secondary | ICD-10-CM | POA: Diagnosis present

## 2018-05-12 DIAGNOSIS — R05 Cough: Secondary | ICD-10-CM | POA: Diagnosis not present

## 2018-05-12 DIAGNOSIS — R0602 Shortness of breath: Secondary | ICD-10-CM | POA: Diagnosis not present

## 2018-05-12 DIAGNOSIS — E1129 Type 2 diabetes mellitus with other diabetic kidney complication: Secondary | ICD-10-CM | POA: Diagnosis not present

## 2018-05-12 DIAGNOSIS — Z7901 Long term (current) use of anticoagulants: Secondary | ICD-10-CM

## 2018-05-12 DIAGNOSIS — D649 Anemia, unspecified: Secondary | ICD-10-CM | POA: Diagnosis present

## 2018-05-12 DIAGNOSIS — E871 Hypo-osmolality and hyponatremia: Secondary | ICD-10-CM | POA: Diagnosis not present

## 2018-05-12 DIAGNOSIS — Z8639 Personal history of other endocrine, nutritional and metabolic disease: Secondary | ICD-10-CM | POA: Diagnosis present

## 2018-05-12 DIAGNOSIS — R069 Unspecified abnormalities of breathing: Secondary | ICD-10-CM | POA: Diagnosis not present

## 2018-05-12 LAB — COMPREHENSIVE METABOLIC PANEL
ALBUMIN: 3.8 g/dL (ref 3.5–5.0)
ALT: 20 U/L (ref 14–54)
ANION GAP: 10 (ref 5–15)
AST: 35 U/L (ref 15–41)
Alkaline Phosphatase: 65 U/L (ref 38–126)
BILIRUBIN TOTAL: 0.4 mg/dL (ref 0.3–1.2)
BUN: 15 mg/dL (ref 6–20)
CALCIUM: 9.3 mg/dL (ref 8.9–10.3)
CO2: 28 mmol/L (ref 22–32)
Chloride: 98 mmol/L — ABNORMAL LOW (ref 101–111)
Creatinine, Ser: 1.26 mg/dL — ABNORMAL HIGH (ref 0.44–1.00)
GFR calc non Af Amer: 40 mL/min — ABNORMAL LOW (ref >60)
GFR, EST AFRICAN AMERICAN: 46 mL/min — AB (ref >60)
GLUCOSE: 175 mg/dL — AB (ref 65–99)
POTASSIUM: 4.7 mmol/L (ref 3.5–5.1)
SODIUM: 136 mmol/L (ref 135–145)
TOTAL PROTEIN: 7.1 g/dL (ref 6.5–8.1)

## 2018-05-12 LAB — CBC
HCT: 29.6 % — ABNORMAL LOW (ref 36.0–46.0)
Hemoglobin: 8.8 g/dL — ABNORMAL LOW (ref 12.0–15.0)
MCH: 26.1 pg (ref 26.0–34.0)
MCHC: 29.7 g/dL — AB (ref 30.0–36.0)
MCV: 87.8 fL (ref 78.0–100.0)
Platelets: 334 10*3/uL (ref 150–400)
RBC: 3.37 MIL/uL — ABNORMAL LOW (ref 3.87–5.11)
RDW: 15.5 % (ref 11.5–15.5)
WBC: 11 10*3/uL — ABNORMAL HIGH (ref 4.0–10.5)

## 2018-05-12 LAB — I-STAT TROPONIN, ED: Troponin i, poc: 0.01 ng/mL (ref 0.00–0.08)

## 2018-05-12 MED ORDER — METHYLPREDNISOLONE SODIUM SUCC 125 MG IJ SOLR: 125.0000 mg | Freq: Once | INTRAMUSCULAR | Status: DC

## 2018-05-12 MED ORDER — SODIUM CHLORIDE 0.9 % IV BOLUS
500.0000 mL | Freq: Once | INTRAVENOUS | Status: AC
  Administered 2018-05-12: 500 mL via INTRAVENOUS

## 2018-05-12 MED ORDER — ALBUTEROL (5 MG/ML) CONTINUOUS INHALATION SOLN
10.0000 mg/h | INHALATION_SOLUTION | Freq: Once | RESPIRATORY_TRACT | Status: AC
  Administered 2018-05-12: 10 mg/h via RESPIRATORY_TRACT
  Filled 2018-05-12: qty 20

## 2018-05-12 NOTE — ED Triage Notes (Addendum)
Patient arrived with EMS from home reports worsening SOB with wheezing and occasional productive cough onset today , she received Solumedrol 125 mg , 2 doses of Duoneb and 1 dose of Albuterol nebulizer with relief , history of COPD /Afib.. Patient placed ob CPAP by EMS prior to arrival and changed to BIPAP by RT at arrival .

## 2018-05-12 NOTE — Progress Notes (Signed)
Continuous albuterol nebulizer stopped due to an increase in heart [phone]). Placed patient back on bipap.

## 2018-05-12 NOTE — Progress Notes (Signed)
Patient arrived via EMS on CPAP with c/o being short of breath. Placed patient on BIPAP 12/6 with oxygen set at 60%.

## 2018-05-12 NOTE — ED Provider Notes (Addendum)
[facility] EMERGENCY DEPARTMENT Provider Note   CSN: [id number] Arrival date & time: 05/12/18  2200     History   Chief Complaint Chief Complaint  Patient presents with  . COPD    SOB    HPI Dominique Clark is a 77 y.o. female presenting for evaluation of shortness of breath and cough.  Patient states yesterday, she developed cough and worsening shortness of breath.  Today she felt that she was gasping for air, and could not breathe at all.  When EMS arrived, she was wheezing.  She was given albuterol, DuoNeb, and Solu-Medrol by EMS.  She was placed on CPAP.  She denies fevers, chills, chest pain, nausea, vomiting, abdominal pain, urinary symptoms, abnormal bowel movements.  Denies leg pain or swelling.  She is on Eliquis, has been taking it twice a day.  She has a history of COPD.  She denies smoking, alcohol use, or other drug use.  No one else around her sick.  HPI  Past Medical History:  Diagnosis Date  . AF (atrial fibrillation) (HCC)   . Asthma   . COPD (chronic obstructive pulmonary disease) (HCC)   . Diabetes mellitus    type 2  . Diverticulosis    s/p colonoscopy  . Gastritis    via EGD  . Hiatal hernia    s/p dilation 2004  . Osteopenia   . Stress incontinence, female   . TIA (transient ischemic attack)     Patient Active Problem List   Diagnosis Date Noted  . Type II diabetes mellitus with renal manifestations (HCC) 05/13/2018  . GERD (gastroesophageal reflux disease) 05/13/2018  . CKD (chronic kidney disease), stage III (HCC) 05/13/2018  . Acute on chronic respiratory failure with hypoxia (HCC) 05/13/2018  . Normocytic anemia 05/13/2018  . AF (atrial fibrillation) (HCC)   . TIA (transient ischemic attack) 11/27/2017  . Atrial fibrillation with RVR (HCC) 11/27/2017  . Elevated serum creatinine 10/16/2016  . Healthcare maintenance 10/16/2016  . Essential hypertension 10/16/2016  . Acute non-recurrent maxillary sinusitis 04/30/2016  .  Medicare annual wellness visit, initial 10/08/2015  . Advance care planning 10/08/2015  . Knee pain 02/23/2015  . Stricture and stenosis of esophagus 02/20/2014  . Special screening for malignant neoplasms, colon 01/30/2014  . COPD with acute exacerbation (HCC) 08/09/2012  . Hiatal hernia 04/20/2012  . Dysphagia 03/09/2012  . Vertigo, benign paroxysmal 05/02/2011  . Anemia 04/17/2011  . Hyperglycemia 02/14/2011  . INCONTINENCE, FEMALE STRESS 10/11/2008  . GASTRITIS 05/30/2008  . ESOPHAGEAL REFLUX 02/24/2008  . Osteopenia 09/29/2007  . NECK AND BACK PAIN 07/26/2007  . COPD (chronic obstructive pulmonary disease) (HCC) 03/29/2007  . DIVERTICULOSIS, COLON W/O HEM 03/29/2007    Past Surgical History:  Procedure Laterality Date  . BLADDER REPAIR  9/04   bladder tack   . BREAST CYST EXCISION Right 1972  . TONSILLECTOMY     age 52  . TOTAL ABDOMINAL HYSTERECTOMY       OB History   None      Home Medications    Prior to Admission medications   Medication Sig Start Date End Date Taking? Authorizing Provider  albuterol (PROVENTIL HFA;VENTOLIN HFA) 108 (90 Base) MCG/ACT inhaler Inhale 1-2 puffs into the lungs every 4 (four) hours as needed for wheezing. Okay to fill with ventolin or proventil if needed. 10/16/16 05/12/26 Yes [name], MD  apixaban (ELIQUIS) 5 MG TABS tablet Take 1 tablet (5 mg total) by mouth 2 (two) times daily.  02/16/18  Yes Gollan, Tollie Pizza, MD  atorvastatin (LIPITOR) 20 MG tablet Take 1 tablet (20 mg total) by mouth daily. 02/16/18  Yes Gollan, Tollie Pizza, MD  budesonide-formoterol (SYMBICORT) 160-4.5 MCG/ACT inhaler INHALE 2 PUFFS EVERY 12 HOURS TO PREVENTCOUGH OR WHEEZING--RINSE, GARGLE, & SPITAFTER EACH USE. Patient taking differently: Inhale 2 puffs into the lungs every 12 (twelve) hours. TO PREVENT COUGH OR WHEEZING--RINSE, GARGLE, & SPIT AFTER EACH USE. 10/16/16  Yes [name], MD  cholecalciferol (VITAMIN D) 1000 units tablet Take 1,000 Units  by mouth daily.   Yes [provider]  diltiazem (CARDIZEM) 30 MG tablet Take 1 tablet (30 mg total) by mouth 3 (three) times daily as needed. 02/16/18  Yes Antonieta Iba, MD  esomeprazole (NEXIUM) 20 MG capsule Take 20 mg by mouth daily at 12 noon.   Yes [provider]  fluticasone (FLONASE) 50 MCG/ACT nasal spray Place 2 sprays into both nostrils daily as needed (seasonal allergies). 01/01/18  Yes [name], MD  ipratropium-albuterol (DUONEB) 0.5-2.5 (3) MG/3ML SOLN Take 3 mLs by nebulization every 4 (four) hours as needed. Dx:J44.9 03/08/18  Yes Shane Crutch, MD  meloxicam (MOBIC) 15 MG tablet TAKE 1/2 TABLET EVERY DAY 04/08/18  Yes [name], MD  metoprolol succinate (TOPROL-XL) 25 MG 24 hr tablet Take 1 tablet (25 mg total) by mouth daily. 04/20/18  Yes [name], MD  traMADol (ULTRAM) 50 MG tablet TAKE 1 TO 2 TABLETS BY MOUTH EVERY 8 HOURS AS NEEDED FOR PAIN. Patient taking differently: Take 50-100 mg by mouth See admin instructions. Take 2 tablets (100 mg) by mouth every morning and take 1 or 2 tablets (50-100 mg) at night - for arthritis pain 11/03/17  Yes [name], MD  vitamin C (ASCORBIC ACID) 500 MG tablet Take 1,000 mg by mouth 2 (two) times daily.   Yes [provider]  azithromycin (ZITHROMAX) 250 MG tablet Take 1 tablet (250 mg total) by mouth daily. Patient not taking: Reported on 05/12/2018 03/04/18   Shane Crutch, MD  predniSONE (STERAPRED UNI-PAK 21 TAB) 10 MG (21) TBPK tablet Take as directed. Patient not taking: Reported on 05/12/2018 03/04/18   Shane Crutch, MD    Family History Family History  Problem Relation Age of Onset  . Stroke Mother   . Breast cancer Neg Hx   . Colon cancer Neg Hx     Social History Social History   Tobacco Use  . Smoking status: Former Smoker    Types: Cigarettes    Last attempt to quit: 12/29/1988    Years since quitting: 29.3  . Smokeless tobacco: Never Used  .  Tobacco comment: 20 or more years   Substance Use Topics  . Alcohol use: No    Alcohol/week: 0.0 oz  . Drug use: No     Allergies   Doxycycline   Review of Systems Review of Systems  Respiratory: Positive for cough and shortness of breath.   All other systems reviewed and are negative.    Physical Exam Updated Vital Signs BP 138/63   Pulse (!) 138   Temp 98.3 F (36.8 C) (Axillary)   Resp (!) 21   SpO2 99%   Physical Exam  Constitutional: She is oriented to person, place, and time. She appears well-developed and well-nourished. No distress.  Elderly female on BiPAP in no distress  HENT:  Head: Normocephalic and atraumatic.  Eyes: Pupils are equal, round, and reactive to light. EOM are normal.  Neck: Normal range of motion. Neck supple.  Cardiovascular: Regular rhythm and intact distal pulses.  Tachycardic  Pulmonary/Chest: Effort normal. She has wheezes.  On BiPAP.  Expiratory wheezes in all fields.  Speaking in full sentences.  Abdominal: Soft. She exhibits no distension and no mass. There is no tenderness. There is no guarding.  Musculoskeletal: Normal range of motion.  No edema bilaterally.  Radial pedal pulses intact bilaterally.    Neurological: She is alert and oriented to person, place, and time. No sensory deficit.  Skin: Skin is warm and dry.  Psychiatric: She has a normal mood and affect.  Nursing note and vitals reviewed.    ED Treatments / Results  Labs (all labs ordered are listed, but only abnormal results are displayed) Labs Reviewed  CBC - Abnormal; Notable for the following components:      Result Value   WBC 11.0 (*)    RBC 3.37 (*)    Hemoglobin 8.8 (*)    HCT 29.6 (*)    MCHC 29.7 (*)    All other components within normal limits  COMPREHENSIVE METABOLIC PANEL - Abnormal; Notable for the following components:   Chloride 98 (*)    Glucose, Bld 175 (*)    Creatinine, Ser 1.26 (*)    GFR calc non Af Amer 40 (*)    GFR calc Af Amer 46  (*)    All other components within normal limits  URINE CULTURE  URINALYSIS, ROUTINE W REFLEX MICROSCOPIC  I-STAT TROPONIN, ED    EKG EKG Interpretation  Date/Time:  Wednesday May 12 2018 22:09:36 EDT Ventricular Rate:  115 PR Interval:    QRS Duration: 91 QT Interval:  349 QTC Calculation: 483 R Axis:   78 Text Interpretation:  Sinus tachycardia Atrial premature complexes RSR' in V1 or V2, right VCD or RVH No significant change since last tracing Confirmed by Linwood Dibbles [phone]) on 05/12/2018 10:53:18 PM   Radiology Dg Chest Portable 1 View  Result Date: 05/12/2018 CLINICAL DATA:  Short of breath EXAM: PORTABLE CHEST 1 VIEW COMPARISON:  11/27/2017 FINDINGS: Hyperinflation with mild diffuse coarse chronic interstitial opacity. No acute airspace disease or effusion. Stable borderline enlarged cardiomediastinal silhouette with aortic atherosclerosis. No pneumothorax. Moderate hiatal hernia. IMPRESSION: Hyperinflation without acute pulmonary opacity. Moderate hiatal hernia Electronically Signed   By: Jasmine Pang M.D.   On: 05/12/2018 23:50    Procedures .Critical Care Performed by: Alveria Apley, PA-C Authorized by: Alveria Apley, PA-C   Critical care provider statement:    Critical care time (minutes):  40   Critical care time was exclusive of:  Separately billable procedures and treating other patients and teaching time   Critical care was necessary to treat or prevent imminent or life-threatening deterioration of the following conditions:  Respiratory failure   Critical care was time spent personally by me on the following activities:  Blood draw for specimens, development of treatment plan with patient or surrogate, discussions with consultants, discussions with primary provider, evaluation of patient's response to treatment, obtaining history from patient or surrogate, ordering and performing treatments and interventions, ordering and review of radiographic studies,  ordering and review of laboratory studies, pulse oximetry, re-evaluation of patient's condition and review of old charts   I assumed direction of critical care for this patient from another provider in my specialty: no   Comments:     Pt with continued wheezing despite multiple albuterol treatments, and placed on BiPAP   (including critical care time)  Medications Ordered  in ED Medications  albuterol (PROVENTIL,VENTOLIN) solution continuous neb (10 mg/hr Nebulization Given 05/12/18 2247)  sodium chloride 0.9 % bolus 500 mL (0 mLs Intravenous Stopped 05/12/18 2327)     Initial Impression / Assessment and Plan / ED Course  I have reviewed the triage vital signs and the nursing notes.  Pertinent labs & imaging results that were available during my care of the patient were reviewed by me and considered in my medical decision making (see chart for details).     Patient presenting for evaluation of shortness of breath and cough.  Symptoms worsened today.  Has had multiple nebulizer treatments and Solu-Medrol with EMS, placed on CPAP with EMS.  On arrival to hospital, patient was switched to BiPAP.  On BiPAP, in no respiratory distress.  Lungs have continued wheezing, will start continuous neb.  Will order basic labs, x-ray, EKG, urine.  Labs show mild leukocytosis of 11.  Mild AKI of 1.2.  X-ray without pneumonia or effusions.  EKG without STEMI, not in A. Fib.  Patient not tolerating continuous neb, causing her heart rate to be >130. Continues to be regular.  Will discontinue neb and restart BiPAP.  Case discussed with attending, Dr. Lynelle Doctor evaluated patient agrees to plan.  Will call for admission for COPD exacerbation.  Discussed with hospitalist, patient to be admitted under [facility] service.   Final Clinical Impressions(s) / ED Diagnoses   Final diagnoses:  COPD exacerbation (HCC)  AKI (acute kidney injury) [facility])    ED Discharge Orders    None       Alveria Apley, PA-C 05/13/18  0038    Alveria Apley, PA-C 05/13/18 1610    Linwood Dibbles, MD 05/16/18 [phone]

## 2018-05-12 NOTE — Progress Notes (Signed)
Patient wanted to remove bipap for awhile. Placed patient on continuous nebulizer to deliver /hr of albuterol. Sp02=98% at this time, breath sounds decreased with expiratory wheezes. Patient does not appear to be in any distress at this time. Will continue to monitor patient.

## 2018-05-12 NOTE — ED Provider Notes (Signed)
Patient presented to the emergency room for evaluation of shortness of breath.  Patient has a history of COPD.  When EMS arrived she was wheezing.  She was given Solu-Medrol by EMS as well as albuterol Atrovent treatments.  Patient was also placed on CPAP.  In the ED the patient does not appear to be in any distress.  She is wheezing.  Plan on laboratory tests chest x-ray.  Anticipate the patient will require hospitalization for COPD exacerbation  Medical screening examination/treatment/procedure(s) were conducted as a shared visit with non-physician practitioner(s) and myself.  I personally evaluated the patient during the encounter.  EKG Interpretation  Date/Time:  Wednesday May 12 2018 22:09:36 EDT Ventricular Rate:  115 PR Interval:    QRS Duration: 91 QT Interval:  349 QTC Calculation: 483 R Axis:   78 Text Interpretation:  Sinus tachycardia Atrial premature complexes RSR' in V1 or V2, right VCD or RVH No significant change since last tracing Confirmed by Linwood Dibbles [phone] on 05/12/2018 10:53:18 PM      Linwood Dibbles, MD 05/12/18 [phone]

## 2018-05-13 ENCOUNTER — Other Ambulatory Visit: Payer: Self-pay

## 2018-05-13 ENCOUNTER — Encounter (HOSPITAL_COMMUNITY): Payer: Self-pay

## 2018-05-13 DIAGNOSIS — Z7951 Long term (current) use of inhaled steroids: Secondary | ICD-10-CM | POA: Diagnosis not present

## 2018-05-13 DIAGNOSIS — D649 Anemia, unspecified: Secondary | ICD-10-CM | POA: Diagnosis present

## 2018-05-13 DIAGNOSIS — E1122 Type 2 diabetes mellitus with diabetic chronic kidney disease: Secondary | ICD-10-CM | POA: Diagnosis present

## 2018-05-13 DIAGNOSIS — J9621 Acute and chronic respiratory failure with hypoxia: Secondary | ICD-10-CM

## 2018-05-13 DIAGNOSIS — I129 Hypertensive chronic kidney disease with stage 1 through stage 4 chronic kidney disease, or unspecified chronic kidney disease: Secondary | ICD-10-CM | POA: Diagnosis present

## 2018-05-13 DIAGNOSIS — Z791 Long term (current) use of non-steroidal anti-inflammatories (NSAID): Secondary | ICD-10-CM | POA: Diagnosis not present

## 2018-05-13 DIAGNOSIS — B9789 Other viral agents as the cause of diseases classified elsewhere: Secondary | ICD-10-CM | POA: Diagnosis present

## 2018-05-13 DIAGNOSIS — N183 Chronic kidney disease, stage 3 unspecified: Secondary | ICD-10-CM | POA: Diagnosis present

## 2018-05-13 DIAGNOSIS — K219 Gastro-esophageal reflux disease without esophagitis: Secondary | ICD-10-CM

## 2018-05-13 DIAGNOSIS — I10 Essential (primary) hypertension: Secondary | ICD-10-CM

## 2018-05-13 DIAGNOSIS — G459 Transient cerebral ischemic attack, unspecified: Secondary | ICD-10-CM

## 2018-05-13 DIAGNOSIS — D72829 Elevated white blood cell count, unspecified: Secondary | ICD-10-CM | POA: Diagnosis not present

## 2018-05-13 DIAGNOSIS — J069 Acute upper respiratory infection, unspecified: Secondary | ICD-10-CM | POA: Diagnosis present

## 2018-05-13 DIAGNOSIS — Z8639 Personal history of other endocrine, nutritional and metabolic disease: Secondary | ICD-10-CM | POA: Diagnosis present

## 2018-05-13 DIAGNOSIS — J441 Chronic obstructive pulmonary disease with (acute) exacerbation: Principal | ICD-10-CM

## 2018-05-13 DIAGNOSIS — T380X5A Adverse effect of glucocorticoids and synthetic analogues, initial encounter: Secondary | ICD-10-CM | POA: Diagnosis not present

## 2018-05-13 DIAGNOSIS — R269 Unspecified abnormalities of gait and mobility: Secondary | ICD-10-CM | POA: Diagnosis present

## 2018-05-13 DIAGNOSIS — E1165 Type 2 diabetes mellitus with hyperglycemia: Secondary | ICD-10-CM | POA: Diagnosis present

## 2018-05-13 DIAGNOSIS — Z881 Allergy status to other antibiotic agents status: Secondary | ICD-10-CM | POA: Diagnosis not present

## 2018-05-13 DIAGNOSIS — J9622 Acute and chronic respiratory failure with hypercapnia: Secondary | ICD-10-CM | POA: Diagnosis present

## 2018-05-13 DIAGNOSIS — E871 Hypo-osmolality and hyponatremia: Secondary | ICD-10-CM | POA: Diagnosis not present

## 2018-05-13 DIAGNOSIS — I4891 Unspecified atrial fibrillation: Secondary | ICD-10-CM

## 2018-05-13 DIAGNOSIS — E785 Hyperlipidemia, unspecified: Secondary | ICD-10-CM | POA: Diagnosis present

## 2018-05-13 DIAGNOSIS — N179 Acute kidney failure, unspecified: Secondary | ICD-10-CM | POA: Diagnosis present

## 2018-05-13 DIAGNOSIS — R5381 Other malaise: Secondary | ICD-10-CM | POA: Diagnosis present

## 2018-05-13 DIAGNOSIS — Z7901 Long term (current) use of anticoagulants: Secondary | ICD-10-CM | POA: Diagnosis not present

## 2018-05-13 DIAGNOSIS — Z87891 Personal history of nicotine dependence: Secondary | ICD-10-CM | POA: Diagnosis not present

## 2018-05-13 DIAGNOSIS — Z8673 Personal history of transient ischemic attack (TIA), and cerebral infarction without residual deficits: Secondary | ICD-10-CM | POA: Diagnosis not present

## 2018-05-13 LAB — GLUCOSE, CAPILLARY
GLUCOSE-CAPILLARY: 142 mg/dL — AB (ref 65–99)
GLUCOSE-CAPILLARY: 134 mg/dL — AB (ref 65–99)
GLUCOSE-CAPILLARY: 164 mg/dL — AB (ref 65–99)
Glucose-Capillary: 135 mg/dL — ABNORMAL HIGH (ref 65–99)
Glucose-Capillary: 132 mg/dL — ABNORMAL HIGH (ref 65–99)

## 2018-05-13 LAB — URINALYSIS, ROUTINE W REFLEX MICROSCOPIC
Bilirubin Urine: NEGATIVE
Glucose, UA: 150 mg/dL — AB
HGB URINE DIPSTICK: NEGATIVE
Ketones, ur: NEGATIVE mg/dL
LEUKOCYTES UA: NEGATIVE
Nitrite: NEGATIVE
PROTEIN: NEGATIVE mg/dL
SPECIFIC GRAVITY, URINE: 1.006 (ref 1.005–1.030)
pH: 6 (ref 5.0–8.0)

## 2018-05-13 LAB — RESPIRATORY PANEL BY PCR
Adenovirus: NOT DETECTED
BORDETELLA PERTUSSIS-RVPCR: NOT DETECTED
CHLAMYDOPHILA PNEUMONIAE-RVPPCR: NOT DETECTED
Coronavirus 229E: NOT DETECTED
Coronavirus HKU1: NOT DETECTED
Coronavirus NL63: NOT DETECTED
Coronavirus OC43: NOT DETECTED
INFLUENZA A-RVPPCR: NOT DETECTED
Influenza B: NOT DETECTED
METAPNEUMOVIRUS-RVPPCR: NOT DETECTED
Mycoplasma pneumoniae: NOT DETECTED
PARAINFLUENZA VIRUS 2-RVPPCR: NOT DETECTED
PARAINFLUENZA VIRUS 3-RVPPCR: DETECTED — AB
Parainfluenza Virus 1: NOT DETECTED
Parainfluenza Virus 4: NOT DETECTED
RHINOVIRUS / ENTEROVIRUS - RVPPCR: NOT DETECTED
Respiratory Syncytial Virus: NOT DETECTED

## 2018-05-13 LAB — PROCALCITONIN: Procalcitonin: 0.15 ng/mL

## 2018-05-13 LAB — BRAIN NATRIURETIC PEPTIDE: B NATRIURETIC PEPTIDE 5: 172.2 pg/mL — AB (ref 0.0–100.0)

## 2018-05-13 LAB — CBG MONITORING, ED: GLUCOSE-CAPILLARY: 329 mg/dL — AB (ref 65–99)

## 2018-05-13 LAB — GROUP A STREP BY PCR: GROUP A STREP BY PCR: NOT DETECTED

## 2018-05-13 LAB — MRSA PCR SCREENING: MRSA by PCR: NEGATIVE

## 2018-05-13 MED ORDER — LEVALBUTEROL HCL 1.25 MG/0.5ML IN NEBU
1.2500 mg | INHALATION_SOLUTION | Freq: Four times a day (QID) | RESPIRATORY_TRACT | Status: DC
  Administered 2018-05-13 – 2018-05-17 (×15): 1.25 mg via RESPIRATORY_TRACT
  Filled 2018-05-13 (×15): qty 0.5

## 2018-05-13 MED ORDER — METOPROLOL SUCCINATE ER 25 MG PO TB24
25.0000 mg | ORAL_TABLET | Freq: Every day | ORAL | Status: DC
  Administered 2018-05-13 – 2018-05-20 (×8): 25 mg via ORAL
  Filled 2018-05-13 (×8): qty 1

## 2018-05-13 MED ORDER — HYDRALAZINE HCL 20 MG/ML IJ SOLN: 5.0000 mg | INTRAMUSCULAR | Status: DC | PRN

## 2018-05-13 MED ORDER — INSULIN ASPART 100 UNIT/ML ~~LOC~~ SOLN
0.0000 [IU] | Freq: Every day | SUBCUTANEOUS | Status: DC
  Administered 2018-05-13: 4 [IU] via SUBCUTANEOUS
  Filled 2018-05-13: qty 1

## 2018-05-13 MED ORDER — ATORVASTATIN CALCIUM 20 MG PO TABS
20.0000 mg | ORAL_TABLET | Freq: Every day | ORAL | Status: DC
  Administered 2018-05-13 – 2018-05-20 (×8): 20 mg via ORAL
  Filled 2018-05-13 (×8): qty 1

## 2018-05-13 MED ORDER — MOMETASONE FURO-FORMOTEROL FUM 200-5 MCG/ACT IN AERO
2.0000 | INHALATION_SPRAY | Freq: Two times a day (BID) | RESPIRATORY_TRACT | Status: DC
  Administered 2018-05-13 – 2018-05-20 (×14): 2 via RESPIRATORY_TRACT
  Filled 2018-05-13: qty 8.8

## 2018-05-13 MED ORDER — IPRATROPIUM BROMIDE 0.02 % IN SOLN
0.5000 mg | Freq: Four times a day (QID) | RESPIRATORY_TRACT | Status: DC
  Administered 2018-05-13 – 2018-05-17 (×15): 0.5 mg via RESPIRATORY_TRACT
  Filled 2018-05-13 (×15): qty 2.5

## 2018-05-13 MED ORDER — DILTIAZEM HCL 30 MG PO TABS
30.0000 mg | ORAL_TABLET | Freq: Three times a day (TID) | ORAL | Status: DC
  Administered 2018-05-13 – 2018-05-15 (×8): 30 mg via ORAL
  Filled 2018-05-13 (×10): qty 1

## 2018-05-13 MED ORDER — LEVALBUTEROL HCL 1.25 MG/0.5ML IN NEBU
INHALATION_SOLUTION | RESPIRATORY_TRACT | Status: AC
  Administered 2018-05-13: 1.25 mg via RESPIRATORY_TRACT
  Filled 2018-05-13: qty 0.5

## 2018-05-13 MED ORDER — ONDANSETRON HCL 4 MG/2ML IJ SOLN: 4.0000 mg | Freq: Three times a day (TID) | INTRAMUSCULAR | Status: DC | PRN

## 2018-05-13 MED ORDER — ACETAMINOPHEN 325 MG PO TABS: 650.0000 mg | ORAL_TABLET | Freq: Four times a day (QID) | ORAL | Status: DC | PRN

## 2018-05-13 MED ORDER — VITAMIN C 500 MG PO TABS
1000.0000 mg | ORAL_TABLET | Freq: Two times a day (BID) | ORAL | Status: DC
  Administered 2018-05-13 – 2018-05-20 (×15): 1000 mg via ORAL
  Filled 2018-05-13 (×14): qty 2

## 2018-05-13 MED ORDER — LEVALBUTEROL HCL 1.25 MG/0.5ML IN NEBU
1.2500 mg | INHALATION_SOLUTION | Freq: Four times a day (QID) | RESPIRATORY_TRACT | Status: DC
  Administered 2018-05-13 (×2): 1.25 mg via RESPIRATORY_TRACT
  Filled 2018-05-13 (×3): qty 0.5

## 2018-05-13 MED ORDER — APIXABAN 5 MG PO TABS
5.0000 mg | ORAL_TABLET | Freq: Two times a day (BID) | ORAL | Status: DC
  Administered 2018-05-13 – 2018-05-20 (×16): 5 mg via ORAL
  Filled 2018-05-13 (×16): qty 1

## 2018-05-13 MED ORDER — TRAMADOL HCL 50 MG PO TABS
50.0000 mg | ORAL_TABLET | Freq: Four times a day (QID) | ORAL | Status: DC | PRN
  Administered 2018-05-13 – 2018-05-16 (×3): 50 mg via ORAL
  Filled 2018-05-13 (×3): qty 1

## 2018-05-13 MED ORDER — VITAMIN D 1000 UNITS PO TABS
1000.0000 [IU] | ORAL_TABLET | Freq: Every day | ORAL | Status: DC
  Administered 2018-05-13 – 2018-05-20 (×7): 1000 [IU] via ORAL
  Filled 2018-05-13 (×8): qty 1

## 2018-05-13 MED ORDER — FLUTICASONE PROPIONATE 50 MCG/ACT NA SUSP: 2.0000 | Freq: Every day | NASAL | Status: DC | PRN

## 2018-05-13 MED ORDER — IPRATROPIUM BROMIDE 0.02 % IN SOLN
0.5000 mg | RESPIRATORY_TRACT | Status: DC
  Administered 2018-05-13 (×4): 0.5 mg via RESPIRATORY_TRACT
  Filled 2018-05-13 (×4): qty 2.5

## 2018-05-13 MED ORDER — LEVOFLOXACIN 500 MG PO TABS
250.0000 mg | ORAL_TABLET | Freq: Every day | ORAL | Status: DC
  Administered 2018-05-13: 250 mg via ORAL
  Filled 2018-05-13: qty 1

## 2018-05-13 MED ORDER — LEVOFLOXACIN 500 MG PO TABS: 500.0000 mg | ORAL_TABLET | Freq: Every day | ORAL | Status: DC

## 2018-05-13 MED ORDER — ZOLPIDEM TARTRATE 5 MG PO TABS
5.0000 mg | ORAL_TABLET | Freq: Every evening | ORAL | Status: DC | PRN
  Administered 2018-05-15: 5 mg via ORAL
  Filled 2018-05-13: qty 1

## 2018-05-13 MED ORDER — PANTOPRAZOLE SODIUM 40 MG PO TBEC
40.0000 mg | DELAYED_RELEASE_TABLET | Freq: Every day | ORAL | Status: DC
  Administered 2018-05-13 – 2018-05-20 (×9): 40 mg via ORAL
  Filled 2018-05-13 (×10): qty 1

## 2018-05-13 MED ORDER — INSULIN ASPART 100 UNIT/ML ~~LOC~~ SOLN
0.0000 [IU] | Freq: Three times a day (TID) | SUBCUTANEOUS | Status: DC
  Administered 2018-05-13 – 2018-05-14 (×4): 1 [IU] via SUBCUTANEOUS
  Administered 2018-05-14: 2 [IU] via SUBCUTANEOUS
  Administered 2018-05-15: 1 [IU] via SUBCUTANEOUS
  Administered 2018-05-15 – 2018-05-16 (×2): 2 [IU] via SUBCUTANEOUS
  Administered 2018-05-17: 1 [IU] via SUBCUTANEOUS
  Administered 2018-05-18: 2 [IU] via SUBCUTANEOUS
  Administered 2018-05-18: 1 [IU] via SUBCUTANEOUS
  Administered 2018-05-18: 3 [IU] via SUBCUTANEOUS
  Administered 2018-05-19: 5 [IU] via SUBCUTANEOUS
  Administered 2018-05-19 (×2): 2 [IU] via SUBCUTANEOUS
  Administered 2018-05-20: 1 [IU] via SUBCUTANEOUS
  Administered 2018-05-20 (×2): 2 [IU] via SUBCUTANEOUS

## 2018-05-13 MED ORDER — LEVOFLOXACIN 500 MG PO TABS
500.0000 mg | ORAL_TABLET | Freq: Once | ORAL | Status: AC
  Administered 2018-05-13: 500 mg via ORAL
  Filled 2018-05-13: qty 1

## 2018-05-13 MED ORDER — DM-GUAIFENESIN ER 30-600 MG PO TB12
1.0000 | ORAL_TABLET | Freq: Two times a day (BID) | ORAL | Status: DC | PRN
Start: 2018-05-13 — End: 2018-05-20
  Administered 2018-05-14 – 2018-05-16 (×2): 1 via ORAL
  Filled 2018-05-13 (×2): qty 1

## 2018-05-13 MED ORDER — METHYLPREDNISOLONE SODIUM SUCC 125 MG IJ SOLR
60.0000 mg | Freq: Three times a day (TID) | INTRAMUSCULAR | Status: DC
  Administered 2018-05-13 – 2018-05-14 (×5): 60 mg via INTRAVENOUS
  Filled 2018-05-13 (×5): qty 2

## 2018-05-13 NOTE — Progress Notes (Signed)
Placed patient on oxygen set at 2lpm as per home regiment. Will continue to monitor patient.

## 2018-05-13 NOTE — H&P (Signed)
History and Physical    Dominique Clark:[id number] DOB: 1941-09-05 DOA: 05/12/2018  Referring Clark/NP/PA:   PCP: Dominique Nam, Clark   Patient coming from:  The patient is coming from home.  At baseline, pt is independent for most of ADL.   Chief Complaint: Shortness of breath and cough  HPI: Dominique Clark is a 77 y.o. female with medical history significant of hyperlipidemia, diet-controlled diabetes, COPD, asthma, TIA, GERD, atrial fibrillation on Eliquis, CKD-3, who presents with shortness of breath and cough.  Patient states that she started having worsening shortness of breath and cough yesterday, which has been progressively getting worse.  He coughs up yellow-colored mucus.  Denies fever, chills, chest pain.  Patient reports sore throat and runny nose.  Per report, patient had oxygen desaturation to 60%, and was started CPAP by EMS.  Patient denies nausea, vomiting, diarrhea, abdominal pain, symptoms of UTI or unilateral weakness.  No hematuria, hematochezia.  Denies dark stool.   ED Course: pt was found to have WBC 11.0, hemoglobin 8.8, which was 11.4 on 12/01/2017, slightly worsening renal function, negative troponin, temperature normal, tachycardia, tachypnea, oxygen saturation 94% on BiPAP, negative chest x-ray full infiltration.  Patient is admitted to stepdown as inpatient.  Review of Systems:   General: no fevers, chills, no body weight gain, has fatigue HEENT: no blurry vision, hearing changes.  Has sore throat and running nose.  Respiratory: has dyspnea, coughing, wheezing CV: no chest pain, no palpitations GI: no nausea, vomiting, abdominal pain, diarrhea, constipation GU: no dysuria, burning on urination, increased urinary frequency, hematuria  Ext: has leg edema Neuro: no unilateral weakness, numbness, or tingling, no vision change or hearing loss Skin: no rash, no skin tear. MSK: No muscle spasm, no deformity, no limitation of range of movement in spin Heme: No easy  bruising.  Travel history: No recent long distant travel.  Allergy:  Allergies  Allergen Reactions  . Doxycycline Nausea And Vomiting    Past Medical History:  Diagnosis Date  . AF (atrial fibrillation) (HCC)   . Asthma   . COPD (chronic obstructive pulmonary disease) (HCC)   . Diabetes mellitus    type 2  . Diverticulosis    s/p colonoscopy  . Gastritis    via EGD  . Hiatal hernia    s/p dilation 2004  . Osteopenia   . Stress incontinence, female   . TIA (transient ischemic attack)     Past Surgical History:  Procedure Laterality Date  . BLADDER REPAIR  9/04   bladder tack   . BREAST CYST EXCISION Right 1972  . TONSILLECTOMY     age 47  . TOTAL ABDOMINAL HYSTERECTOMY      Social History:  reports that she quit smoking about 29 years ago. Her smoking use included cigarettes. She has never used smokeless tobacco. She reports that she does not drink alcohol or use drugs.  Family History:  Family History  Problem Relation Age of Onset  . Stroke Mother   . Breast cancer Neg Hx   . Colon cancer Neg Hx      Prior to Admission medications   Medication Sig Start Date End Date Taking? Authorizing Provider  albuterol (PROVENTIL HFA;VENTOLIN HFA) 108 (90 Base) MCG/ACT inhaler Inhale 1-2 puffs into the lungs every 4 (four) hours as needed for wheezing. Okay to fill with ventolin or proventil if needed. 10/16/16 05/12/26 Yes Dominique Nam, Clark  apixaban (ELIQUIS) 5 MG TABS tablet Take 1 tablet (5 mg  total) by mouth 2 (two) times daily. 02/16/18  Yes Gollan, Tollie Pizza, Clark  atorvastatin (LIPITOR) 20 MG tablet Take 1 tablet (20 mg total) by mouth daily. 02/16/18  Yes Gollan, Tollie Pizza, Clark  budesonide-formoterol (SYMBICORT) 160-4.5 MCG/ACT inhaler INHALE 2 PUFFS EVERY 12 HOURS TO PREVENTCOUGH OR WHEEZING--RINSE, GARGLE, & SPITAFTER EACH USE. Patient taking differently: Inhale 2 puffs into the lungs every 12 (twelve) hours. TO PREVENT COUGH OR WHEEZING--RINSE, GARGLE, & SPIT AFTER  EACH USE. 10/16/16  Yes Dominique Nam, Clark  cholecalciferol (VITAMIN D) 1000 units tablet Take 1,000 Units by mouth daily.   Yes Provider, Historical, Clark  diltiazem (CARDIZEM) 30 MG tablet Take 1 tablet (30 mg total) by mouth 3 (three) times daily as needed. 02/16/18  Yes Antonieta Iba, Clark  esomeprazole (NEXIUM) 20 MG capsule Take 20 mg by mouth daily at 12 noon.   Yes Provider, Historical, Clark  fluticasone (FLONASE) 50 MCG/ACT nasal spray Place 2 sprays into both nostrils daily as needed (seasonal allergies). 01/01/18  Yes Dominique Nam, Clark  ipratropium-albuterol (DUONEB) 0.5-2.5 (3) MG/3ML SOLN Take 3 mLs by nebulization every 4 (four) hours as needed. Dx:J44.9 03/08/18  Yes Shane Crutch, Clark  meloxicam (MOBIC) 15 MG tablet TAKE 1/2 TABLET EVERY DAY 04/08/18  Yes Dominique Nam, Clark  metoprolol succinate (TOPROL-XL) 25 MG 24 hr tablet Take 1 tablet (25 mg total) by mouth daily. 04/20/18  Yes Dominique Nam, Clark  traMADol (ULTRAM) 50 MG tablet TAKE 1 TO 2 TABLETS BY MOUTH EVERY 8 HOURS AS NEEDED FOR PAIN. Patient taking differently: Take 50-100 mg by mouth See admin instructions. Take 2 tablets (100 mg) by mouth every morning and take 1 or 2 tablets (50-100 mg) at night - for arthritis pain 11/03/17  Yes Dominique Nam, Clark  vitamin C (ASCORBIC ACID) 500 MG tablet Take 1,000 mg by mouth 2 (two) times daily.   Yes Provider, Historical, Clark  azithromycin (ZITHROMAX) 250 MG tablet Take 1 tablet (250 mg total) by mouth daily. Patient not taking: Reported on 05/12/2018 03/04/18   Shane Crutch, Clark  predniSONE (STERAPRED UNI-PAK 21 TAB) 10 MG (21) TBPK tablet Take as directed. Patient not taking: Reported on 05/12/2018 03/04/18   Shane Crutch, Clark    Physical Exam: Vitals:   05/13/18 0115 05/13/18 0130 05/13/18 0145 05/13/18 0147  BP: (!) 142/54 126/67 (!) 144/50   Pulse: (!) 125 (!) 124 (!) 125   Resp: (!) 23 (!) 21 (!) 21   Temp:      TempSrc:      SpO2: (!) 89% 93% 95% 95%    General: Not in acute distress HEENT:       Eyes: PERRL, EOMI, no scleral icterus.       ENT: No discharge from the ears and nose, no pharynx injection, no tonsillar enlargement.        Neck: No JVD, no bruit, no mass felt. Heme: No neck lymph node enlargement. Cardiac: S1/S2, RRR, No murmurs, No gallops or rubs. Respiraton:  Has decreased air movement bilaterally. Has diffuse wheezing bilaterally. GI: Soft, nondistended, nontender, no rebound pain, no organomegaly, BS present. GU: No hematuria Ext: has trace leg edema bilaterally. 2+DP/PT pulse bilaterally. Musculoskeletal: No joint deformities, No joint redness or warmth, no limitation of ROM in spin. Skin: No rashes.  Neuro: Alert, oriented X3, cranial nerves II-XII grossly intact, moves all extremities normally. Psych: Patient is not psychotic, no suicidal or hemocidal ideation.  Labs on Admission: I have  personally reviewed following labs and imaging studies  CBC: Recent Labs  Lab 05/12/18 2233  WBC 11.0*  HGB 8.8*  HCT 29.6*  MCV 87.8  PLT 334   Basic Metabolic Panel: Recent Labs  Lab 05/12/18 2233  NA 136  K 4.7  CL 98*  CO2 28  GLUCOSE 175*  BUN 15  CREATININE 1.26*  CALCIUM 9.3   GFR: CrCl cannot be calculated (Unknown ideal weight.). Liver Function Tests: Recent Labs  Lab 05/12/18 2233  AST 35  ALT 20  ALKPHOS 65  BILITOT 0.4  PROT 7.1  ALBUMIN 3.8   No results for input(s): LIPASE, AMYLASE in the last 168 hours. No results for input(s): AMMONIA in the last 168 hours. Coagulation Profile: No results for input(s): INR, PROTIME in the last 168 hours. Cardiac Enzymes: No results for input(s): CKTOTAL, CKMB, CKMBINDEX, TROPONINI in the last 168 hours. BNP (last 3 results) No results for input(s): PROBNP in the last 8760 hours. HbA1C: No results for input(s): HGBA1C in the last 72 hours. CBG: Recent Labs  Lab 05/13/18 0144  GLUCAP 329*   Lipid Profile: No results for input(s): CHOL, HDL,  LDLCALC, TRIG, CHOLHDL, LDLDIRECT in the last 72 hours. Thyroid Function Tests: No results for input(s): TSH, T4TOTAL, FREET4, T3FREE, THYROIDAB in the last 72 hours. Anemia Panel: No results for input(s): VITAMINB12, FOLATE, FERRITIN, TIBC, IRON, RETICCTPCT in the last 72 hours. Urine analysis:    Component Value Date/Time   COLORURINE YELLOW 11/28/2017 0711   APPEARANCEUR HAZY (A) 11/28/2017 0711   LABSPEC 1.020 11/28/2017 0711   PHURINE 5.0 11/28/2017 0711   GLUCOSEU NEGATIVE 11/28/2017 0711   HGBUR NEGATIVE 11/28/2017 0711   HGBUR negative 10/11/2008 0926   BILIRUBINUR NEGATIVE 11/28/2017 0711   BILIRUBINUR Neg 05/12/2015 1234   KETONESUR NEGATIVE 11/28/2017 0711   PROTEINUR 30 (A) 11/28/2017 0711   UROBILINOGEN 0.2 05/12/2015 1234   UROBILINOGEN 1.0 08/09/2012 1727   NITRITE NEGATIVE 11/28/2017 0711   LEUKOCYTESUR NEGATIVE 11/28/2017 0711   Sepsis Labs: (procalcitonin:4,lacticidven:4) )No results found for this or any previous visit (from the past 240 hour(s)).   Radiological Exams on Admission: Dg Chest Portable 1 View  Result Date: 05/12/2018 CLINICAL DATA:  Short of breath EXAM: PORTABLE CHEST 1 VIEW COMPARISON:  11/27/2017 FINDINGS: Hyperinflation with mild diffuse coarse chronic interstitial opacity. No acute airspace disease or effusion. Stable borderline enlarged cardiomediastinal silhouette with aortic atherosclerosis. No pneumothorax. Moderate hiatal hernia. IMPRESSION: Hyperinflation without acute pulmonary opacity. Moderate hiatal hernia Electronically Signed   By: Jasmine Pang M.D.   On: 05/12/2018 23:50     EKG: Independently reviewed.  Not done in ED, will get one.   Assessment/Plan Principal Problem:   COPD exacerbation (HCC) Active Problems:   Essential hypertension   TIA (transient ischemic attack)   Atrial fibrillation with RVR (HCC)   Type II diabetes mellitus with renal manifestations (HCC)   GERD (gastroesophageal reflux disease)    CKD (chronic kidney disease), stage III (HCC)   Acute on chronic respiratory failure with hypoxia (HCC)   Normocytic anemia   Acute on chronic respiratory failure with hypoxia due to COPD exacerbation [facility]): Chest x-ray negative for infiltration.  Improving on BiPAP.  -will admit patient to SDU bed as inpt -continue BiPAP -Nebulizers: scheduled Atrovent and prn Xopenex Nebs -Solu-Medrol 60 mg IV tid -Levaquin (patient is allergic to doxycycline. took Z-Pak recently) -Mucinex for cough  -Incentive spirometry -Urine S. pneumococcal antigen -Follow up blood culture x2, sputum culture, respiratory virus panel,  Flu pcr -check Rapid strep -Nasal cannula oxygen as needed to maintain O2 saturation 92% or greater  HTN:  -Continue home medications: Cardizem, metoprolol, -IV hydralazine prn  Atrial Fibrillation with RVR: HR 132-->120s now. CHA2DS2-VASc Score is 6, needs oral anticoagulation. Patient is on Eliquis at home. -Metoprolol and Cardizem -If heart rate is above 130 persistently, will start Cardizem drip -Continue Eliquis  Hx of TIA (transient ischemic attack): -on Eliquis and the Lipitor  Diet controled Type II diabetes mellitus with renal manifestations (HCC): Last A1c 6.2, well controled. Patient is not taking meds at home. CBG 175, expecting worsening with IV steroid use -SSI  GERD: -Protonix  CKD (chronic kidney disease), stage III (HCC): Slightly worsening.  Baseline creatinine 1.14 on 12/10/2017.  Her creatinine is 1.26, BUN 15. -Hold Mobic - Follow-up with CBC  Normocytic anemia: Hemoglobin 8.8, which was 11.4 on 12/01/2017.  Patient denies hematochezia or hematuria.  No bleeding tendency. -Check FOBT   DVT ppx: on Eliquis Code Status: Full code (I discussed with patient about code status, she is not ready to make decision now. Will be full code now).  Family Communication: None at bed side.   Disposition Plan:  Anticipate discharge back to previous home  environment Consults called:  none Admission status: SDU/inpation       Date of Service 05/13/2018    Lorretta Harp [facility] Pager [phone]  If 7PM-7AM, please contact night-coverage [email or web address] Password [email or web address] 05/13/2018, 2:11 AM

## 2018-05-13 NOTE — Progress Notes (Signed)
Inpatient Diabetes Program Recommendations  AACE/ADA: New Consensus Statement on Inpatient Glycemic Control (2015)  Target Ranges:  Prepandial:   less than 140 mg/dL      Peak postprandial:   less than 180 mg/dL (1-2 hours)      Critically ill patients:  140 - 180 mg/dL   Results for [name], Dominique Clark (MRN [id number]) as of 05/13/2018 08:47  Ref. Range 05/13/2018 01:44  Glucose-Capillary Latest Ref Range: 65 - 99 mg/dL 562 (H) Novolog 4 units given   Review of Glycemic Control  Diabetes history: DM 2 Outpatient Diabetes medications: None noted Current orders for Inpatient glycemic control: Novolog Sensitive Correction 0-9 units tid + Novolog HS scale 0-5 units  Inpatient Diabetes Program Recommendations:    Patient receiving IV Solumedrol 60 mg Q8 hours. Glucose 300's consider Lantus 8 units while on steroids.  Thanks,  Christena Deem RN, MSN, BC-ADM, [facility] Inpatient Diabetes Coordinator Team Pager [phone] (8a-5p)

## 2018-05-13 NOTE — Progress Notes (Signed)
  PROGRESS NOTE  Patient seen and examined on the floor.  Patient was admitted by Dr. Clyde Lundborg for shortness of breath secondary to COPD exacerbation.  Initially patient was on BiPAP and later weaned off.  Doing well at this time.  When I saw her on the floor she did not have any complaints and states her shortness of breath had improved but still feels exertional shortness of breath.  At baseline she uses 2 L nasal cannula at home.  Does admit of some URI type symptoms for which she took Z-Pak that could have precipitated this.  Vitals:   05/13/18 1154 05/13/18 1216  BP:  136/63  Pulse:  90  Resp:  20  Temp:  98.1 F (36.7 C)  SpO2: 94% 98%    Her vital signs are stable, she has been weaned off BiPAP to nasal cannula. Procalcitonin is 5.  Slightly elevated WBC at 11.0 but she has received steroids, rest of her labs are unremarkable. At this point we will continue care as planned earlier today.  Please call with further questions if necessary.  Genella Bas Joline Maxcy, MD [facility] Pager [phone]   If 7PM-7AM, please contact night-coverage [email or web address] Password [facility] 05/13/2018, 12:17 PM

## 2018-05-14 LAB — GLUCOSE, CAPILLARY
GLUCOSE-CAPILLARY: 178 mg/dL — AB (ref 65–99)
GLUCOSE-CAPILLARY: 117 mg/dL — AB (ref 65–99)
Glucose-Capillary: 132 mg/dL — ABNORMAL HIGH (ref 65–99)
Glucose-Capillary: 141 mg/dL — ABNORMAL HIGH (ref 65–99)

## 2018-05-14 MED ORDER — LEVOFLOXACIN 500 MG PO TABS
500.0000 mg | ORAL_TABLET | Freq: Every day | ORAL | Status: AC
  Administered 2018-05-14 – 2018-05-16 (×3): 500 mg via ORAL
  Filled 2018-05-14 (×3): qty 1

## 2018-05-14 MED ORDER — METHYLPREDNISOLONE SODIUM SUCC 40 MG IJ SOLR
40.0000 mg | Freq: Two times a day (BID) | INTRAMUSCULAR | Status: DC
  Administered 2018-05-14 – 2018-05-15 (×2): 40 mg via INTRAVENOUS
  Filled 2018-05-14 (×2): qty 1

## 2018-05-14 MED ORDER — FUROSEMIDE 10 MG/ML IJ SOLN
40.0000 mg | Freq: Once | INTRAMUSCULAR | Status: AC
  Administered 2018-05-14: 40 mg via INTRAVENOUS
  Filled 2018-05-14: qty 4

## 2018-05-14 MED ORDER — ORAL CARE MOUTH RINSE
15.0000 mL | Freq: Two times a day (BID) | OROMUCOSAL | Status: DC
  Administered 2018-05-14 – 2018-05-20 (×12): 15 mL via OROMUCOSAL

## 2018-05-14 MED ORDER — PREDNISONE 20 MG PO TABS: 50.0000 mg | ORAL_TABLET | Freq: Every day | ORAL | Status: DC

## 2018-05-14 NOTE — Progress Notes (Signed)
0530: RN was notified by CCMD that pt was in afib. Pt c/o SOB shortly thereafter. Respiratory came and gave her scheduled breathing treatment early. RN gave morning scheduled dose of cardizem. EKG obtained  0700: Pt feeling better, less short of breath, now back in NSR. Asymptomatic  Will continue to monitor. Caswell Corwin, RN 05/14/18 7:39 AM

## 2018-05-14 NOTE — Progress Notes (Addendum)
PROGRESS NOTE    Dominique Clark  ZOX:[id number] DOB: [birth year] DOA: 05/12/2018 PCP: [name], MD   Brief Narrative:  77 year old female with history of hyperlipidemia, diet-controlled diabetes mellitus type 2, COPD, asthma, GERD, atrial fibrillation on Eliquis, CKD stage III, TIA came to the hospital with complains of shortness of breath and cough.  Upon admission she was diagnosed with COPD exacerbation.  Patient reported that she had some sore throat and possibly contact with someone with strep throat therefore was checked which was negative.  Her respiratory panel was negative for influenza but did show parainfluenza positive.  Upon admission she was started on IV Solu-Medrol and scheduled bronchodilators.  Her home bronchodilator including Dulera was continued.  Over the course of 48 hours her breathing symptoms improved and felt Cathell better.   Assessment & Plan:   Principal Problem:   COPD exacerbation (HCC) Active Problems:   Essential hypertension   TIA (transient ischemic attack)   Atrial fibrillation with RVR (HCC)   Type II diabetes mellitus with renal manifestations (HCC)   GERD (gastroesophageal reflux disease)   CKD (chronic kidney disease), stage III (HCC)   Acute on chronic respiratory failure with hypoxia (HCC)   Normocytic anemia  Acute respiratory distress with hypoxia, improved Mild to moderate acute exacerbation of mild persistent COPD Upper respiratory infection with parainfluenza - We will continue patient on Xopenex scheduled and as necessary.  Will taper down Solu-Medrol today to oral prednisone.  Continue supplemental oxygen at 3 L nasal cannula and as she improves low-grade down to 2 L which is her home baseline. -During ambulation today patient did get quite short of breath - Continue using incentive spirometry and flutter valve   History of atrial fibrillation -Currently she is on Cardizem 30 mg every 8 hours and Toprol-XL 25 mg  daily -Continue home regimen of Eliquis 500 mg twice daily  History of diabetes mellitus type 2 -Accu-Cheks and sliding scale  Hyperlipidemia -Continue atorvastatin 20 mg daily  History of TIA -No residual deficits.  She is Artie on Eliquis and statin  CKD stage III -Renal function appears to be stable  Essential hypertension -Continue Cardizem and metoprolol  DVT prophylaxis: Continue Eliquis Code Status: Full code  Family Communication:  none at bedside Disposition Plan: Maintain another day of inpatient stay for next 24 hours.  Patient has very poor support at home therefore will make sure she continues to improve before discharged   ADDENDUM 535PM Nurse notified me that the patient was complaining of sob. When I saw the patient at bedside and she appeared in Gerardo more distress than earlier with minimal sob. Although her vitals were stable her lung had diffuse diminished BS.  At this time, I will give her a neb tx, lasix  IV, restart levaquin and change PO prednisone to IV solumedrol (to be given now). If necessary, will get CXR and ABG.  Will Continue to monitor her.   Consultants:   None  Procedures:   None  Antimicrobials:   Levaquin 1 dose at the time of admission   Subjective: No complaints this morning, states she is feeling better.  When attempted ambulating her she did get quite short of breath with minimal exertion.  Review of Systems Otherwise negative except as per HPI, including: HEENT/EYES = negative for pain, redness, loss of vision, double vision, blurred vision, loss of hearing, sore throat, hoarseness, dysphagia Cardiovascular= negative for chest pain, palpitation, murmurs, lower extremity swelling Respiratory/lungs= negative for  shortness of breath, cough, hemoptysis, wheezing, mucus production Gastrointestinal= negative for nausea, vomiting,, abdominal pain, melena, hematemesis Genitourinary= negative for Dysuria, Hematuria, Change in  Urinary Frequency MSK = Negative for arthralgia, myalgias, Back Pain, Joint swelling  Neurology= Negative for headache, seizures, numbness, tingling  Psychiatry= Negative for anxiety, depression, suicidal and homocidal ideation Allergy/Immunology= Medication/Food allergy as listed  Skin= Negative for Rash, lesions, ulcers, itching   Objective: Vitals:   05/14/18 0553 05/14/18 0806 05/14/18 1027 05/14/18 1202  BP:  (!) 151/68 (!) 126/59 140/77  Pulse:  (!) 101 88 93  Resp:  17  19  Temp:  98.1 F (36.7 C)  98.1 F (36.7 C)  TempSrc:  Oral  Oral  SpO2: 99% 99%  99%  Weight:      Height:        Intake/Output Summary (Last 24 hours) at 05/14/2018 1323 Last data filed at 05/14/2018 0400 Gross per 24 hour  Intake 240 ml  Output 900 ml  Net -660 ml   Filed Weights   05/13/18 0925  Weight: 63.9 kg (140 lb 14 oz)    Examination: Constitutional: NAD, calm, comfortable, elderly frail-appearing Eyes: PERRL, lids and conjunctivae normal ENMT: Mucous membranes are moist. Posterior pharynx clear of any exudate or lesions.Normal dentition.  Neck: normal, supple, no masses, no thyromegaly Respiratory: Diminished breath sounds at the bases Cardiovascular: Regular rate and rhythm, no murmurs / rubs / gallops. No extremity edema. 2+ pedal pulses. No carotid bruits.  Abdomen: no tenderness, no masses palpated. No hepatosplenomegaly. Bowel sounds positive.  Musculoskeletal: no clubbing / cyanosis. No joint deformity upper and lower extremities. Good ROM, no contractures. Normal muscle tone.  Skin: no rashes, lesions, ulcers. No induration Neurologic: CN 2-12 grossly intact. Sensation intact, DTR normal. Strength 5/5 in all 4.  Psychiatric: Normal judgment and insight. Alert and oriented x 3. Normal mood.   Data Reviewed:   CBC: Recent Labs  Lab 05/12/18 2233  WBC 11.0*  HGB 8.8*  HCT 29.6*  MCV 87.8  PLT 334   Basic Metabolic Panel: Recent Labs  Lab 05/12/18 2233  NA 136  K  4.7  CL 98*  CO2 28  GLUCOSE 175*  BUN 15  CREATININE 1.26*  CALCIUM 9.3   GFR: Estimated Creatinine Clearance: 32.3 mL/min (A) (by C-G formula based on SCr of 1.26 mg/dL (H)). Liver Function Tests: Recent Labs  Lab 05/12/18 2233  AST 35  ALT 20  ALKPHOS 65  BILITOT 0.4  PROT 7.1  ALBUMIN 3.8   No results for input(s): LIPASE, AMYLASE in the last 168 hours. No results for input(s): AMMONIA in the last 168 hours. Coagulation Profile: No results for input(s): INR, PROTIME in the last 168 hours. Cardiac Enzymes: No results for input(s): CKTOTAL, CKMB, CKMBINDEX, TROPONINI in the last 168 hours. BNP (last 3 results) No results for input(s): PROBNP in the last 8760 hours. HbA1C: No results for input(s): HGBA1C in the last 72 hours. CBG: Recent Labs  Lab 05/13/18 1229 05/13/18 1630 05/13/18 2106 05/14/18 0804 05/14/18 1201  GLUCAP 132* 134* 164* 132* 178*   Lipid Profile: No results for input(s): CHOL, HDL, LDLCALC, TRIG, CHOLHDL, LDLDIRECT in the last 72 hours. Thyroid Function Tests: No results for input(s): TSH, T4TOTAL, FREET4, T3FREE, THYROIDAB in the last 72 hours. Anemia Panel: No results for input(s): VITAMINB12, FOLATE, FERRITIN, TIBC, IRON, RETICCTPCT in the last 72 hours. Sepsis Labs: Recent Labs  Lab 05/13/18 1012  PROCALCITON 0.15    Recent Results (from the past  240 hour(s))  Respiratory Panel by PCR     Status: Abnormal   Collection Time: 05/13/18 12:46 AM  Result Value Ref Range Status   Adenovirus NOT DETECTED NOT DETECTED Final   Coronavirus 229E NOT DETECTED NOT DETECTED Final   Coronavirus HKU1 NOT DETECTED NOT DETECTED Final   Coronavirus NL63 NOT DETECTED NOT DETECTED Final   Coronavirus OC43 NOT DETECTED NOT DETECTED Final   Metapneumovirus NOT DETECTED NOT DETECTED Final   Rhinovirus / Enterovirus NOT DETECTED NOT DETECTED Final   Influenza A NOT DETECTED NOT DETECTED Final   Influenza B NOT DETECTED NOT DETECTED Final    Parainfluenza Virus 1 NOT DETECTED NOT DETECTED Final   Parainfluenza Virus 2 NOT DETECTED NOT DETECTED Final   Parainfluenza Virus 3 DETECTED (A) NOT DETECTED Final   Parainfluenza Virus 4 NOT DETECTED NOT DETECTED Final   Respiratory Syncytial Virus NOT DETECTED NOT DETECTED Final   Bordetella pertussis NOT DETECTED NOT DETECTED Final   Chlamydophila pneumoniae NOT DETECTED NOT DETECTED Final   Mycoplasma pneumoniae NOT DETECTED NOT DETECTED Final    Comment: Performed at [facility], [address] [place], [place] [address]  Group A Strep by PCR     Status: None   Collection Time: 05/13/18 12:46 AM  Result Value Ref Range Status   Group A Strep by PCR NOT DETECTED NOT DETECTED Final    Comment: Performed at [facility], [address] [place], [place] [address]  Culture, blood (routine x 2) Call MD if unable to obtain prior to antibiotics being given     Status: None (Preliminary result)   Collection Time: 05/13/18  2:03 AM  Result Value Ref Range Status   Specimen Description BLOOD LEFT ANTECUBITAL  Final   Special Requests   Final    BOTTLES DRAWN AEROBIC AND ANAEROBIC Blood Culture adequate volume   Culture   Final    NO GROWTH 1 DAY Performed at [facility], [address] [place], [place] [address]    Report Status PENDING  Incomplete  Culture, blood (routine x 2) Call MD if unable to obtain prior to antibiotics being given     Status: None (Preliminary result)   Collection Time: 05/13/18  2:12 AM  Result Value Ref Range Status   Specimen Description BLOOD LEFT FOREARM  Final   Special Requests   Final    BOTTLES DRAWN AEROBIC AND ANAEROBIC Blood Culture adequate volume   Culture   Final    NO GROWTH 1 DAY Performed at [facility], [address] [place], [place] [address]    Report Status PENDING  Incomplete  Urine culture     Status: Abnormal (Preliminary result)   Collection Time: 05/13/18  3:57 AM  Result Value Ref Range Status    Specimen Description URINE, CLEAN CATCH  Final   Special Requests   Final    NONE Performed at [facility], [address], [place], [place] [address]    Culture 20,000 COLONIES/mL STAPHYLOCOCCUS HAEMOLYTICUS (A)  Final   Report Status PENDING  Incomplete  MRSA PCR Screening     Status: None   Collection Time: 05/13/18  9:06 AM  Result Value Ref Range Status   MRSA by PCR NEGATIVE NEGATIVE Final    Comment:        The GeneXpert MRSA Assay (FDA approved for NASAL specimens only), is one component of a comprehensive MRSA colonization surveillance program. It is not  intended to diagnose MRSA infection nor to guide or monitor treatment for MRSA infections. Performed at [facility], [address] [place], [place] [address]          Radiology Studies: Dg Chest Portable 1 View  Result Date: 05/12/2018 CLINICAL DATA:  Short of breath EXAM: PORTABLE CHEST 1 VIEW COMPARISON:  11/27/2017 FINDINGS: Hyperinflation with mild diffuse coarse chronic interstitial opacity. No acute airspace disease or effusion. Stable borderline enlarged cardiomediastinal silhouette with aortic atherosclerosis. No pneumothorax. Moderate hiatal hernia. IMPRESSION: Hyperinflation without acute pulmonary opacity. Moderate hiatal hernia Electronically Signed   By: Jasmine Pang M.D.   On: 05/12/2018 23:50        Scheduled Meds: . apixaban  5 mg Oral BID  . atorvastatin  20 mg Oral Daily  . cholecalciferol  1,000 Units Oral Daily  . diltiazem  30 mg Oral Q8H  . insulin aspart  0-5 Units Subcutaneous QHS  . insulin aspart  0-9 Units Subcutaneous TID WC  . ipratropium  0.5 mg Nebulization Q6H  . levalbuterol  1.25 mg Nebulization Q6H  . levofloxacin  250 mg Oral QHS  . mouth rinse  15 mL Mouth Rinse BID  . methylPREDNISolone (SOLU-MEDROL) injection  60 mg Intravenous Q8H  . metoprolol succinate  25 mg Oral Daily  . mometasone-formoterol  2 puff Inhalation BID  . pantoprazole  40 mg Oral  Daily  . vitamin C  1,000 mg Oral BID   Continuous Infusions:   LOS: 1 day    I have spent 35 minutes face to face with the patient and on the ward discussing the patients care, assessment, plan and disposition with other care givers. >50% of the time was devoted counseling the patient about the risks and benefits of treatment and coordinating care.     Meshelle Holness Joline Maxcy, MD [facility] Pager [phone]   If 7PM-7AM, please contact night-coverage [email or web address] Password [email or web address] 05/14/2018, 1:23 PM

## 2018-05-15 ENCOUNTER — Inpatient Hospital Stay (HOSPITAL_COMMUNITY): Payer: Medicare HMO

## 2018-05-15 LAB — BRAIN NATRIURETIC PEPTIDE: B NATRIURETIC PEPTIDE 5: 225.9 pg/mL — AB (ref 0.0–100.0)

## 2018-05-15 LAB — GLUCOSE, CAPILLARY
GLUCOSE-CAPILLARY: 120 mg/dL — AB (ref 65–99)
GLUCOSE-CAPILLARY: 149 mg/dL — AB (ref 65–99)
GLUCOSE-CAPILLARY: 168 mg/dL — AB (ref 65–99)
Glucose-Capillary: 149 mg/dL — ABNORMAL HIGH (ref 65–99)

## 2018-05-15 LAB — CBC
HCT: 30.3 % — ABNORMAL LOW (ref 36.0–46.0)
Hemoglobin: 9.2 g/dL — ABNORMAL LOW (ref 12.0–15.0)
MCH: 26.2 pg (ref 26.0–34.0)
MCHC: 30.4 g/dL (ref 30.0–36.0)
MCV: 86.3 fL (ref 78.0–100.0)
Platelets: 347 10*3/uL (ref 150–400)
RBC: 3.51 MIL/uL — ABNORMAL LOW (ref 3.87–5.11)
RDW: 15.8 % — ABNORMAL HIGH (ref 11.5–15.5)
WBC: 24.5 10*3/uL — ABNORMAL HIGH (ref 4.0–10.5)

## 2018-05-15 LAB — MAGNESIUM: MAGNESIUM: 2 mg/dL (ref 1.7–2.4)

## 2018-05-15 LAB — BASIC METABOLIC PANEL
Anion gap: 12 (ref 5–15)
BUN: 32 mg/dL — AB (ref 6–20)
CALCIUM: 9.5 mg/dL (ref 8.9–10.3)
CO2: 32 mmol/L (ref 22–32)
CREATININE: 1.4 mg/dL — AB (ref 0.44–1.00)
Chloride: 93 mmol/L — ABNORMAL LOW (ref 101–111)
GFR calc Af Amer: 41 mL/min — ABNORMAL LOW (ref >60)
GFR, EST NON AFRICAN AMERICAN: 35 mL/min — AB (ref >60)
Glucose, Bld: 141 mg/dL — ABNORMAL HIGH (ref 65–99)
POTASSIUM: 4.4 mmol/L (ref 3.5–5.1)
SODIUM: 137 mmol/L (ref 135–145)

## 2018-05-15 LAB — URINE CULTURE

## 2018-05-15 MED ORDER — DILTIAZEM HCL 100 MG IV SOLR
10.0000 mg/h | INTRAVENOUS | Status: DC
  Administered 2018-05-15 (×2): 15 mg/h via INTRAVENOUS
  Administered 2018-05-15: 10 mg/h via INTRAVENOUS
  Administered 2018-05-16: 15 mg/h via INTRAVENOUS
  Administered 2018-05-16: 10 mg/h via INTRAVENOUS
  Filled 2018-05-15 (×6): qty 100

## 2018-05-15 MED ORDER — LORAZEPAM 2 MG/ML IJ SOLN
0.5000 mg | Freq: Once | INTRAMUSCULAR | Status: AC
  Administered 2018-05-15: 0.5 mg via INTRAVENOUS
  Filled 2018-05-15: qty 1

## 2018-05-15 MED ORDER — METHYLPREDNISOLONE SODIUM SUCC 40 MG IJ SOLR
40.0000 mg | Freq: Three times a day (TID) | INTRAMUSCULAR | Status: DC
  Administered 2018-05-15 – 2018-05-19 (×12): 40 mg via INTRAVENOUS
  Filled 2018-05-15 (×12): qty 1

## 2018-05-15 MED ORDER — FUROSEMIDE 10 MG/ML IJ SOLN
80.0000 mg | Freq: Once | INTRAMUSCULAR | Status: AC
  Administered 2018-05-15: 80 mg via INTRAVENOUS
  Filled 2018-05-15: qty 8

## 2018-05-15 MED ORDER — METOPROLOL TARTRATE 5 MG/5ML IV SOLN
5.0000 mg | Freq: Once | INTRAVENOUS | Status: AC
  Administered 2018-05-15: 5 mg via INTRAVENOUS
  Filled 2018-05-15: qty 5

## 2018-05-15 MED ORDER — DILTIAZEM HCL 60 MG PO TABS
60.0000 mg | ORAL_TABLET | Freq: Three times a day (TID) | ORAL | Status: DC
  Administered 2018-05-15 – 2018-05-16 (×2): 60 mg via ORAL
  Filled 2018-05-15 (×3): qty 1

## 2018-05-15 NOTE — Progress Notes (Signed)
Patient insisted to RN that she come off of the bipap or she was going to pull it off herself. At this time patient looks like she is still working hard to breathe. RT has talked with RN about having the MD order something for anxiety scheduled so we can get patient back on bipap.

## 2018-05-15 NOTE — Progress Notes (Addendum)
PROGRESS NOTE    Dominique Clark  XBM:[id number] DOB: June 07, 1941 DOA: 05/12/2018 PCP: [name], MD   Brief Narrative:  77 year old female with history of hyperlipidemia, diet-controlled diabetes mellitus type 2, COPD, asthma, GERD, atrial fibrillation on Eliquis, CKD stage III, TIA came to the hospital with complains of shortness of breath and cough.  Upon admission she was diagnosed with COPD exacerbation.  Patient reported that she had some sore throat and possibly contact with someone with strep throat therefore was checked which was negative.  Her respiratory panel was negative for influenza but did show parainfluenza positive.  Upon admission she was started on IV Solu-Medrol and scheduled bronchodilators.  Her home bronchodilator including Dulera was continued.     Assessment & Plan:   Principal Problem:   COPD exacerbation (HCC) Active Problems:   Essential hypertension   TIA (transient ischemic attack)   Atrial fibrillation with RVR (HCC)   Type II diabetes mellitus with renal manifestations (HCC)   GERD (gastroesophageal reflux disease)   CKD (chronic kidney disease), stage III (HCC)   Acute on chronic respiratory failure with hypoxia (HCC)   Normocytic anemia  Acute respiratory distress with hypoxia, slightly worsened this morning Mild to moderate acute exacerbation of mild persistent COPD Upper respiratory infection with parainfluenza, slightly worse today - Although she is not hypoxic she is quite short of breath with diffuse diminished breath sounds.  I will increase the Solu-Medrol from every 12 hours to every 8 hours.  She will continue getting her scheduled and as needed breathing treatments.  Continue home Dulera - We will continue oral Levaquin for presumed underlying bacterial bronchitis.  -During ambulation today patient did get quite short of breath - Continue using incentive spirometry and flutter valve  -Chest x-ray suggests COPD changes -Currently  doing well on 3 L nasal cannula but if necessary we will place her on BiPAP.  Atrial fibrillation with RVR -Continue her current home oral medication Cardizem 30 mg every 8 hours, Toprol-XL 25 mg daily.  We will start the patient on Cardizem drip for better rate control. -Continue home regimen of Eliqui Levaquin s 5 mg twice daily  Leukocytosis - Secondary to her being on steroids.  Remains afebrile.  She is Artie on Levaquin.  History of diabetes mellitus type 2 -Accu-Cheks and sliding scale  Hyperlipidemia -Continue atorvastatin 20 mg daily  History of TIA -No residual deficits.  She is Artie on Eliquis and statin  CKD stage III -Renal function appears to be stable  Essential hypertension -Continue Cardizem and metoprolol  DVT prophylaxis: Continue Eliquis Code Status: Full code  Family Communication:  none at bedside Disposition Plan: Due to worsening of her breathing status she will require another 24-48 hours of inpatient stay.  Addendum 1235pm Patient is have Coriz more resp distress and I am concerned for fatigue.  I will order BiPAP. Spoke with the patient and family at the bedside. Nursing staff aware.    Consultants:   None  Procedures:   None  Antimicrobials:   Levaquin   Subjective: Patient feels quite short of breath again this morning sitting up on the side of the bed.  When she was in atrial fibrillation with RVR with heart rate in 140s with stable blood pressure she had diffuse diminished breath sounds.  Review of Systems Otherwise negative except as per HPI, including: HEENT/EYES = negative for pain, redness, loss of vision, double vision, blurred vision, loss of hearing, sore throat, hoarseness, dysphagia  Cardiovascular= negative for chest pain, palpitation, murmurs, lower extremity swelling Respiratory/lungs= negative forcough, hemoptysis, wheezing, mucus production Gastrointestinal= negative for nausea, vomiting,, abdominal pain, melena,  hematemesis Genitourinary= negative for Dysuria, Hematuria, Change in Urinary Frequency MSK = Negative for arthralgia, myalgias, Back Pain, Joint swelling  Neurology= Negative for headache, seizures, numbness, tingling  Psychiatry= Negative for anxiety, depression, suicidal and homocidal ideation Allergy/Immunology= Medication/Food allergy as listed  Skin= Negative for Rash, lesions, ulcers, itching    Objective: Vitals:   05/15/18 0401 05/15/18 0417 05/15/18 0741 05/15/18 0800  BP:  (!) 156/7  (!) 149/78  Pulse:  (!) 108  (!) 144  Resp:  (!) 25  (!) 26  Temp:  97.7 F (36.5 C)  97.6 F (36.4 C)  TempSrc:  Oral  Oral  SpO2: 97% 94% 100% 95%  Weight:      Height:        Intake/Output Summary (Last 24 hours) at 05/15/2018 1055 Last data filed at 05/15/2018 0013 Gross per 24 hour  Intake -  Output 1350 ml  Net -1350 ml   Filed Weights   05/13/18 0925  Weight: 63.9 kg (140 lb 14 oz)    Examination: Constitutional: Mild distress due to shortness of breath Eyes: PERRL, lids and conjunctivae normal ENMT: Mucous membranes are moist. Posterior pharynx clear of any exudate or lesions.Normal dentition.  Neck: normal, supple, no masses, no thyromegaly Respiratory: Diffuse diminished breath sounds Cardiovascular: Tachycardia with irregularly irregular heart rate in 140s, no murmurs / rubs / gallops. No extremity edema. 2+ pedal pulses. No carotid bruits.  Abdomen: no tenderness, no masses palpated. No hepatosplenomegaly. Bowel sounds positive.  Musculoskeletal: no clubbing / cyanosis. No joint deformity upper and lower extremities. Good ROM, no contractures. Normal muscle tone.  Skin: no rashes, lesions, ulcers. No induration Neurologic: CN 2-12 grossly intact. Sensation intact, DTR normal. Strength 5/5 in all 4.  Psychiatric: Normal judgment and insight. Alert and oriented x 3. Normal mood.   Data Reviewed:   CBC: Recent Labs  Lab 05/12/18 2233 05/15/18 0334  WBC 11.0*  24.5*  HGB 8.8* 9.2*  HCT 29.6* 30.3*  MCV 87.8 86.3  PLT 334 347   Basic Metabolic Panel: Recent Labs  Lab 05/12/18 2233 05/15/18 0334  NA 136 137  K 4.7 4.4  CL 98* 93*  CO2 28 32  GLUCOSE 175* 141*  BUN 15 32*  CREATININE 1.26* 1.40*  CALCIUM 9.3 9.5  MG  --  2.0   GFR: Estimated Creatinine Clearance: 29.1 mL/min (A) (by C-G formula based on SCr of 1.4 mg/dL (H)). Liver Function Tests: Recent Labs  Lab 05/12/18 2233  AST 35  ALT 20  ALKPHOS 65  BILITOT 0.4  PROT 7.1  ALBUMIN 3.8   No results for input(s): LIPASE, AMYLASE in the last 168 hours. No results for input(s): AMMONIA in the last 168 hours. Coagulation Profile: No results for input(s): INR, PROTIME in the last 168 hours. Cardiac Enzymes: No results for input(s): CKTOTAL, CKMB, CKMBINDEX, TROPONINI in the last 168 hours. BNP (last 3 results) No results for input(s): PROBNP in the last 8760 hours. HbA1C: No results for input(s): HGBA1C in the last 72 hours. CBG: Recent Labs  Lab 05/14/18 0804 05/14/18 1201 05/14/18 1631 05/14/18 2113 05/15/18 0725  GLUCAP 132* 178* 117* 141* 120*   Lipid Profile: No results for input(s): CHOL, HDL, LDLCALC, TRIG, CHOLHDL, LDLDIRECT in the last 72 hours. Thyroid Function Tests: No results for input(s): TSH, T4TOTAL, FREET4, T3FREE, THYROIDAB in the  last 72 hours. Anemia Panel: No results for input(s): VITAMINB12, FOLATE, FERRITIN, TIBC, IRON, RETICCTPCT in the last 72 hours. Sepsis Labs: Recent Labs  Lab 05/13/18 1012  PROCALCITON 0.15    Recent Results (from the past 240 hour(s))  Respiratory Panel by PCR     Status: Abnormal   Collection Time: 05/13/18 12:46 AM  Result Value Ref Range Status   Adenovirus NOT DETECTED NOT DETECTED Final   Coronavirus 229E NOT DETECTED NOT DETECTED Final   Coronavirus HKU1 NOT DETECTED NOT DETECTED Final   Coronavirus NL63 NOT DETECTED NOT DETECTED Final   Coronavirus OC43 NOT DETECTED NOT DETECTED Final    Metapneumovirus NOT DETECTED NOT DETECTED Final   Rhinovirus / Enterovirus NOT DETECTED NOT DETECTED Final   Influenza A NOT DETECTED NOT DETECTED Final   Influenza B NOT DETECTED NOT DETECTED Final   Parainfluenza Virus 1 NOT DETECTED NOT DETECTED Final   Parainfluenza Virus 2 NOT DETECTED NOT DETECTED Final   Parainfluenza Virus 3 DETECTED (A) NOT DETECTED Final   Parainfluenza Virus 4 NOT DETECTED NOT DETECTED Final   Respiratory Syncytial Virus NOT DETECTED NOT DETECTED Final   Bordetella pertussis NOT DETECTED NOT DETECTED Final   Chlamydophila pneumoniae NOT DETECTED NOT DETECTED Final   Mycoplasma pneumoniae NOT DETECTED NOT DETECTED Final    Comment: Performed at [facility], [address] [place], [place] [address]  Group A Strep by PCR     Status: None   Collection Time: 05/13/18 12:46 AM  Result Value Ref Range Status   Group A Strep by PCR NOT DETECTED NOT DETECTED Final    Comment: Performed at [facility], [address], [place], [place] [address]  Culture, blood (routine x 2) Call MD if unable to obtain prior to antibiotics being given     Status: None (Preliminary result)   Collection Time: 05/13/18  2:03 AM  Result Value Ref Range Status   Specimen Description BLOOD LEFT ANTECUBITAL  Final   Special Requests   Final    BOTTLES DRAWN AEROBIC AND ANAEROBIC Blood Culture adequate volume   Culture   Final    NO GROWTH 1 DAY Performed at [facility], [address], [place], [place] [address]    Report Status PENDING  Incomplete  Culture, blood (routine x 2) Call MD if unable to obtain prior to antibiotics being given     Status: None (Preliminary result)   Collection Time: 05/13/18  2:12 AM  Result Value Ref Range Status   Specimen Description BLOOD LEFT FOREARM  Final   Special Requests   Final    BOTTLES DRAWN AEROBIC AND ANAEROBIC Blood Culture adequate volume   Culture   Final    NO GROWTH 1 DAY Performed at [facility], [address], [place], [place] [address]    Report Status PENDING  Incomplete  Urine culture     Status: Abnormal   Collection Time: 05/13/18  3:57 AM  Result Value Ref Range Status   Specimen Description URINE, CLEAN CATCH  Final   Special Requests   Final    NONE Performed at [facility], [address] [place], [place] [address]    Culture 20,000 COLONIES/mL STAPHYLOCOCCUS HAEMOLYTICUS (A)  Final   Report Status 05/15/2018 FINAL  Final   Organism ID, Bacteria STAPHYLOCOCCUS HAEMOLYTICUS (A)  Final      Susceptibility   Staphylococcus haemolyticus - MIC*    CIPROFLOXACIN >=8 RESISTANT  Resistant     GENTAMICIN <=0.5 SENSITIVE Sensitive     NITROFURANTOIN 32 SENSITIVE Sensitive     OXACILLIN >=4 RESISTANT Resistant     TETRACYCLINE >=16 RESISTANT Resistant     VANCOMYCIN <=0.5 SENSITIVE Sensitive     TRIMETH/SULFA <=10 SENSITIVE Sensitive     CLINDAMYCIN >=8 RESISTANT Resistant     RIFAMPIN <=0.5 SENSITIVE Sensitive     Inducible Clindamycin NEGATIVE Sensitive     * 20,000 COLONIES/mL STAPHYLOCOCCUS HAEMOLYTICUS  MRSA PCR Screening     Status: None   Collection Time: 05/13/18  9:06 AM  Result Value Ref Range Status   MRSA by PCR NEGATIVE NEGATIVE Final    Comment:        The GeneXpert MRSA Assay (FDA approved for NASAL specimens only), is one component of a comprehensive MRSA colonization surveillance program. It is not intended to diagnose MRSA infection nor to guide or monitor treatment for MRSA infections. Performed at [facility], [address], [place], [place] [address]          Radiology Studies: Dg Chest Port 1 View  Result Date: 05/15/2018 CLINICAL DATA:  Shortness of breath for the past 2 days. EXAM: PORTABLE CHEST 1 VIEW COMPARISON:  Chest x-ray dated May 12, 2018. FINDINGS: Stable borderline cardiomegaly. Normal pulmonary vascularity. The lungs remain hyperinflated with emphysematous changes. Coarsened interstitial markings are unchanged. No  focal consolidation, pleural effusion, or pneumothorax. No acute osseous abnormality. Unchanged moderate hiatal hernia. IMPRESSION: COPD.  No active disease. Electronically Signed   By: Obie Dredge M.D.   On: 05/15/2018 08:33        Scheduled Meds: . apixaban  5 mg Oral BID  . atorvastatin  20 mg Oral Daily  . cholecalciferol  1,000 Units Oral Daily  . diltiazem  30 mg Oral Q8H  . insulin aspart  0-5 Units Subcutaneous QHS  . insulin aspart  0-9 Units Subcutaneous TID WC  . ipratropium  0.5 mg Nebulization Q6H  . levalbuterol  1.25 mg Nebulization Q6H  . levofloxacin  500 mg Oral Daily  . mouth rinse  15 mL Mouth Rinse BID  . methylPREDNISolone (SOLU-MEDROL) injection  40 mg Intravenous Q12H  . metoprolol succinate  25 mg Oral Daily  . mometasone-formoterol  2 puff Inhalation BID  . pantoprazole  40 mg Oral Daily  . vitamin C  1,000 mg Oral BID   Continuous Infusions: . diltiazem (CARDIZEM) infusion 10 mg/hr (05/15/18 0818)     LOS: 2 days    I have spent 35 minutes face to face with the patient and on the ward discussing the patients care, assessment, plan and disposition with other care givers. >50% of the time was devoted counseling the patient about the risks and benefits of treatment and coordinating care.     Dahlia Nifong Joline Maxcy, MD [facility] Pager [phone]   If 7PM-7AM, please contact night-coverage [email or web address] Password [facility] 05/15/2018, 10:55 AM

## 2018-05-15 NOTE — Progress Notes (Signed)
CCM called pt have hr in 160's now in afib with RVR and short of breath.  Paged Dr Nelson Chimes , Respiratory admin breathing treatment .

## 2018-05-16 LAB — GLUCOSE, CAPILLARY
GLUCOSE-CAPILLARY: 114 mg/dL — AB (ref 65–99)
GLUCOSE-CAPILLARY: 113 mg/dL — AB (ref 65–99)
Glucose-Capillary: 161 mg/dL — ABNORMAL HIGH (ref 65–99)
Glucose-Capillary: 118 mg/dL — ABNORMAL HIGH (ref 65–99)

## 2018-05-16 LAB — CBC
HCT: 29.7 % — ABNORMAL LOW (ref 36.0–46.0)
Hemoglobin: 9.2 g/dL — ABNORMAL LOW (ref 12.0–15.0)
MCH: 25.8 pg — AB (ref 26.0–34.0)
MCHC: 31 g/dL (ref 30.0–36.0)
MCV: 83.4 fL (ref 78.0–100.0)
PLATELETS: 344 10*3/uL (ref 150–400)
RBC: 3.56 MIL/uL — ABNORMAL LOW (ref 3.87–5.11)
RDW: 15.2 % (ref 11.5–15.5)
WBC: 15.3 10*3/uL — AB (ref 4.0–10.5)

## 2018-05-16 LAB — BASIC METABOLIC PANEL
Anion gap: 14 (ref 5–15)
BUN: 53 mg/dL — AB (ref 6–20)
CALCIUM: 9.4 mg/dL (ref 8.9–10.3)
CO2: 30 mmol/L (ref 22–32)
CREATININE: 1.57 mg/dL — AB (ref 0.44–1.00)
Chloride: 87 mmol/L — ABNORMAL LOW (ref 101–111)
GFR calc non Af Amer: 31 mL/min — ABNORMAL LOW (ref >60)
GFR, EST AFRICAN AMERICAN: 36 mL/min — AB (ref >60)
Glucose, Bld: 183 mg/dL — ABNORMAL HIGH (ref 65–99)
Potassium: 4.4 mmol/L (ref 3.5–5.1)
Sodium: 131 mmol/L — ABNORMAL LOW (ref 135–145)

## 2018-05-16 LAB — MAGNESIUM: Magnesium: 2.1 mg/dL (ref 1.7–2.4)

## 2018-05-16 MED ORDER — POLYETHYLENE GLYCOL 3350 17 G PO PACK
17.0000 g | PACK | Freq: Every day | ORAL | Status: DC | PRN
  Administered 2018-05-16 – 2018-05-18 (×2): 17 g via ORAL
  Filled 2018-05-16 (×2): qty 1

## 2018-05-16 MED ORDER — DILTIAZEM HCL 30 MG PO TABS
30.0000 mg | ORAL_TABLET | Freq: Once | ORAL | Status: AC
  Administered 2018-05-16: 30 mg via ORAL
  Filled 2018-05-16: qty 1

## 2018-05-16 MED ORDER — SENNA 8.6 MG PO TABS
1.0000 | ORAL_TABLET | Freq: Every day | ORAL | Status: DC | PRN
  Administered 2018-05-17 – 2018-05-18 (×2): 8.6 mg via ORAL
  Filled 2018-05-16 (×2): qty 1

## 2018-05-16 MED ORDER — DILTIAZEM HCL 60 MG PO TABS
120.0000 mg | ORAL_TABLET | Freq: Three times a day (TID) | ORAL | Status: DC
  Administered 2018-05-16 – 2018-05-20 (×13): 120 mg via ORAL
  Filled 2018-05-16 (×15): qty 2

## 2018-05-16 MED ORDER — LORAZEPAM 2 MG/ML IJ SOLN
0.5000 mg | INTRAMUSCULAR | Status: DC | PRN
  Administered 2018-05-16 – 2018-05-20 (×4): 0.5 mg via INTRAVENOUS
  Filled 2018-05-16 (×4): qty 1

## 2018-05-16 NOTE — Plan of Care (Signed)
Pt with increased anxiety and WOB this afternoon.  Required PRN Ativan dose.  RT present. Breathing treatment administered without adequate response.  Pt placed back on Bipap.  Noted increased upper airway congestion but pt with weak cough was unable to completely clear.  Poor appetite noted.  Encouraged to eat, family also encouraged to eat.  Per MD order, Cardizem gtt slowly weaned off today.  Pt now resting calmly without signs of distress.

## 2018-05-16 NOTE — Progress Notes (Signed)
PROGRESS NOTE    Dominique Clark  ZOX:[id number] DOB: January 16, 1941 DOA: 05/12/2018 PCP: [name], MD   Brief Narrative:  77 year old female with history of hyperlipidemia, diet-controlled diabetes mellitus type 2, COPD, asthma, GERD, atrial fibrillation on Eliquis, CKD stage III, TIA came to the hospital with complains of shortness of breath and cough.  Upon admission she was diagnosed with COPD exacerbation.  Patient reported that she had some sore throat and possibly contact with someone with strep throat therefore was checked which was negative.  Her respiratory panel was negative for influenza but did show parainfluenza positive.  Upon admission she was started on IV Solu-Medrol and scheduled bronchodilators.  Her home bronchodilator including Dulera was continued.     Assessment & Plan:   Principal Problem:   COPD exacerbation (HCC) Active Problems:   Essential hypertension   TIA (transient ischemic attack)   Atrial fibrillation with RVR (HCC)   Type II diabetes mellitus with renal manifestations (HCC)   GERD (gastroesophageal reflux disease)   CKD (chronic kidney disease), stage III (HCC)   Acute on chronic respiratory failure with hypoxia (HCC)   Normocytic anemia  Acute respiratory distress with hypoxia, persist Mild to moderate acute exacerbation of mild persistent COPD Upper respiratory infection with parainfluenza, slightly worse today - Continue Solu-Medrol 40 mg IV every 8 hours along with scheduled and as needed bronchodilators.  Continue home regimen of Dulera -Continue Levaquin.  Encouraged to use incentive spirometry and flutter valve - BiPAP as necessary, due to anxiety have added low-dose Ativan which has been helping her to remain on BiPAP for longer - Supplemental oxygen as needed, uses 2 L nasal cannula at home, maintain oxygen saturation greater than 90%.  Atrial fibrillation with RVR - Despite of increasing Cardizem to 60 mg yesterday she still  remains in atrial fibrillation with RVR with drip of 15 mg/h.  I will increase Cardizem again 120 mg every 8 hours with attempts to wean off the drip.  If necessary we will get cardiology input. -Continue home regimen of Eliqui Levaquin s 5 mg twice daily  Leukocytosis - Improving, this is combination of her being on steroids and possible underlying infection. -We will check procalcitonin levels again tomorrow morning  History of diabetes mellitus type 2 -Accu-Cheks and sliding scale  Hyperlipidemia -Continue atorvastatin 20 mg daily  History of TIA -No residual deficits.  Continue Eliquis and statin  CKD stage III -Renal function appears to be stable  Essential hypertension -Continue Cardizem and metoprolol  DVT prophylaxis: Continue Eliquis Code Status: Full code  Family Communication:  none at bedside Disposition Plan: Maintain inpatient stay for another 1-2 days  Consultants:   None  Procedures:   None  Antimicrobials:   Levaquin   Subjective: Patient still feels a [place] short of breath especially with minimal exertion.  Review of Systems Otherwise negative except as per HPI, including: HEENT/EYES = negative for pain, redness, loss of vision, double vision, blurred vision, loss of hearing, sore throat, hoarseness, dysphagia Cardiovascular= negative for chest pain, murmurs, lower extremity swelling Respiratory/lungs= negative for  cough, hemoptysis, wheezing, mucus production Gastrointestinal= negative for nausea, vomiting,, abdominal pain, melena, hematemesis Genitourinary= negative for Dysuria, Hematuria, Change in Urinary Frequency MSK = Negative for arthralgia, myalgias, Back Pain, Joint swelling  Neurology= Negative for headache, seizures, numbness, tingling  Psychiatry= Negative for anxiety, depression, suicidal and homocidal ideation Allergy/Immunology= Medication/Food allergy as listed  Skin= Negative for Rash, lesions, ulcers,  itching  Objective: Vitals:   05/15/18 2342 05/16/18 0135 05/16/18 0339 05/16/18 0341  BP: (!) 164/107   (!) 96/55  Pulse: 98  84   Resp: (!) 23  (!) 23 17  Temp: (!) 97.5 F (36.4 C)   97.6 F (36.4 C)  TempSrc: Oral   Oral  SpO2: 96% 95% 97% 96%  Weight:      Height:        Intake/Output Summary (Last 24 hours) at 05/16/2018 1014 Last data filed at 05/16/2018 0900 Gross per 24 hour  Intake 391.75 ml  Output 300 ml  Net 91.75 ml   Filed Weights   05/13/18 0925  Weight: 63.9 kg (140 lb 14 oz)    Examination: Constitutional: Mild distress due to some shortness of breath Eyes: PERRL, lids and conjunctivae normal ENMT: Mucous membranes are moist. Posterior pharynx clear of any exudate or lesions.Normal dentition.  Neck: normal, supple, no masses, no thyromegaly Respiratory: Diffuse diminished breath sounds but slightly improved with expiratory wheezing Cardiovascular: Tachycardia with irregularly irregular heart rate, no murmurs / rubs / gallops. No extremity edema. 2+ pedal pulses. No carotid bruits.  Abdomen: no tenderness, no masses palpated. No hepatosplenomegaly. Bowel sounds positive.  Musculoskeletal: no clubbing / cyanosis. No joint deformity upper and lower extremities. Good ROM, no contractures. Normal muscle tone.  Skin: no rashes, lesions, ulcers. No induration Neurologic: CN 2-12 grossly intact. Sensation intact, DTR normal. Strength 5/5 in all 4.  Psychiatric: Normal judgment and insight. Alert and oriented x 3. Normal mood.   Data Reviewed:   CBC: Recent Labs  Lab 05/12/18 2233 05/15/18 0334 05/16/18 0603  WBC 11.0* 24.5* 15.3*  HGB 8.8* 9.2* 9.2*  HCT 29.6* 30.3* 29.7*  MCV 87.8 86.3 83.4  PLT 334 347 344   Basic Metabolic Panel: Recent Labs  Lab 05/12/18 2233 05/15/18 0334 05/16/18 0603  NA 136 137 131*  K 4.7 4.4 4.4  CL 98* 93* 87*  CO2 28 32 30  GLUCOSE 175* 141* 183*  BUN 15 32* 53*  CREATININE 1.26* 1.40* 1.57*  CALCIUM 9.3  9.5 9.4  MG  --  2.0 2.1   GFR: Estimated Creatinine Clearance: 25.9 mL/min (A) (by C-G formula based on SCr of 1.57 mg/dL (H)). Liver Function Tests: Recent Labs  Lab 05/12/18 2233  AST 35  ALT 20  ALKPHOS 65  BILITOT 0.4  PROT 7.1  ALBUMIN 3.8   No results for input(s): LIPASE, AMYLASE in the last 168 hours. No results for input(s): AMMONIA in the last 168 hours. Coagulation Profile: No results for input(s): INR, PROTIME in the last 168 hours. Cardiac Enzymes: No results for input(s): CKTOTAL, CKMB, CKMBINDEX, TROPONINI in the last 168 hours. BNP (last 3 results) No results for input(s): PROBNP in the last 8760 hours. HbA1C: No results for input(s): HGBA1C in the last 72 hours. CBG: Recent Labs  Lab 05/15/18 0725 05/15/18 1156 05/15/18 1634 05/15/18 2213 05/16/18 0727  GLUCAP 120* 149* 168* 149* 161*   Lipid Profile: No results for input(s): CHOL, HDL, LDLCALC, TRIG, CHOLHDL, LDLDIRECT in the last 72 hours. Thyroid Function Tests: No results for input(s): TSH, T4TOTAL, FREET4, T3FREE, THYROIDAB in the last 72 hours. Anemia Panel: No results for input(s): VITAMINB12, FOLATE, FERRITIN, TIBC, IRON, RETICCTPCT in the last 72 hours. Sepsis Labs: Recent Labs  Lab 05/13/18 1012  PROCALCITON 0.15    Recent Results (from the past 240 hour(s))  Respiratory Panel by PCR     Status: Abnormal   Collection Time: 05/13/18  12:46 AM  Result Value Ref Range Status   Adenovirus NOT DETECTED NOT DETECTED Final   Coronavirus 229E NOT DETECTED NOT DETECTED Final   Coronavirus HKU1 NOT DETECTED NOT DETECTED Final   Coronavirus NL63 NOT DETECTED NOT DETECTED Final   Coronavirus OC43 NOT DETECTED NOT DETECTED Final   Metapneumovirus NOT DETECTED NOT DETECTED Final   Rhinovirus / Enterovirus NOT DETECTED NOT DETECTED Final   Influenza A NOT DETECTED NOT DETECTED Final   Influenza B NOT DETECTED NOT DETECTED Final   Parainfluenza Virus 1 NOT DETECTED NOT DETECTED Final    Parainfluenza Virus 2 NOT DETECTED NOT DETECTED Final   Parainfluenza Virus 3 DETECTED (A) NOT DETECTED Final   Parainfluenza Virus 4 NOT DETECTED NOT DETECTED Final   Respiratory Syncytial Virus NOT DETECTED NOT DETECTED Final   Bordetella pertussis NOT DETECTED NOT DETECTED Final   Chlamydophila pneumoniae NOT DETECTED NOT DETECTED Final   Mycoplasma pneumoniae NOT DETECTED NOT DETECTED Final    Comment: Performed at [facility], [address], [place], [place] [address]  Group A Strep by PCR     Status: None   Collection Time: 05/13/18 12:46 AM  Result Value Ref Range Status   Group A Strep by PCR NOT DETECTED NOT DETECTED Final    Comment: Performed at [facility], [address], [place], [place] [address]  Culture, blood (routine x 2) Call MD if unable to obtain prior to antibiotics being given     Status: None (Preliminary result)   Collection Time: 05/13/18  2:03 AM  Result Value Ref Range Status   Specimen Description BLOOD LEFT ANTECUBITAL  Final   Special Requests   Final    BOTTLES DRAWN AEROBIC AND ANAEROBIC Blood Culture adequate volume   Culture   Final    NO GROWTH 2 DAYS Performed at [facility], [address] [place], [place] [address]    Report Status PENDING  Incomplete  Culture, blood (routine x 2) Call MD if unable to obtain prior to antibiotics being given     Status: None (Preliminary result)   Collection Time: 05/13/18  2:12 AM  Result Value Ref Range Status   Specimen Description BLOOD LEFT FOREARM  Final   Special Requests   Final    BOTTLES DRAWN AEROBIC AND ANAEROBIC Blood Culture adequate volume   Culture   Final    NO GROWTH 2 DAYS Performed at [facility], [address], [place], [place] [address]    Report Status PENDING  Incomplete  Urine culture     Status: Abnormal   Collection Time: 05/13/18  3:57 AM  Result Value Ref Range Status   Specimen Description URINE, CLEAN CATCH  Final   Special Requests   Final     NONE Performed at [facility], [address] [place], [place] [address]    Culture 20,000 COLONIES/mL STAPHYLOCOCCUS HAEMOLYTICUS (A)  Final   Report Status 05/15/2018 FINAL  Final   Organism ID, Bacteria STAPHYLOCOCCUS HAEMOLYTICUS (A)  Final      Susceptibility   Staphylococcus haemolyticus - MIC*    CIPROFLOXACIN >=8 RESISTANT Resistant     GENTAMICIN <=0.5 SENSITIVE Sensitive     NITROFURANTOIN 32 SENSITIVE Sensitive     OXACILLIN >=4 RESISTANT Resistant     TETRACYCLINE >=16 RESISTANT Resistant     VANCOMYCIN <=0.5 SENSITIVE Sensitive     TRIMETH/SULFA <=10 SENSITIVE Sensitive     CLINDAMYCIN >=8 RESISTANT Resistant  RIFAMPIN <=0.5 SENSITIVE Sensitive     Inducible Clindamycin NEGATIVE Sensitive     * 20,000 COLONIES/mL STAPHYLOCOCCUS HAEMOLYTICUS  MRSA PCR Screening     Status: None   Collection Time: 05/13/18  9:06 AM  Result Value Ref Range Status   MRSA by PCR NEGATIVE NEGATIVE Final    Comment:        The GeneXpert MRSA Assay (FDA approved for NASAL specimens only), is one component of a comprehensive MRSA colonization surveillance program. It is not intended to diagnose MRSA infection nor to guide or monitor treatment for MRSA infections. Performed at [facility], [address], [place], [place] [address]          Radiology Studies: Dg Chest Port 1 View  Result Date: 05/15/2018 CLINICAL DATA:  Shortness of breath for the past 2 days. EXAM: PORTABLE CHEST 1 VIEW COMPARISON:  Chest x-ray dated May 12, 2018. FINDINGS: Stable borderline cardiomegaly. Normal pulmonary vascularity. The lungs remain hyperinflated with emphysematous changes. Coarsened interstitial markings are unchanged. No focal consolidation, pleural effusion, or pneumothorax. No acute osseous abnormality. Unchanged moderate hiatal hernia. IMPRESSION: COPD.  No active disease. Electronically Signed   By: Obie Dredge M.D.   On: 05/15/2018 08:33        Scheduled Meds: .  apixaban  5 mg Oral BID  . atorvastatin  20 mg Oral Daily  . cholecalciferol  1,000 Units Oral Daily  . diltiazem  120 mg Oral Q8H  . diltiazem  30 mg Oral Once  . insulin aspart  0-5 Units Subcutaneous QHS  . insulin aspart  0-9 Units Subcutaneous TID WC  . ipratropium  0.5 mg Nebulization Q6H  . levalbuterol  1.25 mg Nebulization Q6H  . levofloxacin  500 mg Oral Daily  . mouth rinse  15 mL Mouth Rinse BID  . methylPREDNISolone (SOLU-MEDROL) injection  40 mg Intravenous Q8H  . metoprolol succinate  25 mg Oral Daily  . mometasone-formoterol  2 puff Inhalation BID  . pantoprazole  40 mg Oral Daily  . vitamin C  1,000 mg Oral BID   Continuous Infusions: . diltiazem (CARDIZEM) infusion 15 mg/hr (05/16/18 0413)     LOS: 3 days    I have spent 30 minutes face to face with the patient and on the ward discussing the patients care, assessment, plan and disposition with other care givers. >50% of the time was devoted counseling the patient about the risks and benefits of treatment and coordinating care.     Ankit Joline Maxcy, MD [facility] Pager [phone]   If 7PM-7AM, please contact night-coverage [email or web address] Password [email or web address] 05/16/2018, 10:14 AM

## 2018-05-16 NOTE — Progress Notes (Signed)
RT Note: Patient developed some increased work of breathing and increased shortness of breath this afternoon. She is having a lot of congestion but she is able to expectorate some secretions. RT administered her nebulizer treatment but her increased WOB and SOB persisted. She was placed back on her Bipap and she is presently tolerating it well. Rt will continue to monitor.

## 2018-05-17 LAB — GLUCOSE, CAPILLARY
GLUCOSE-CAPILLARY: 148 mg/dL — AB (ref 65–99)
Glucose-Capillary: 98 mg/dL (ref 65–99)
Glucose-Capillary: 118 mg/dL — ABNORMAL HIGH (ref 65–99)
Glucose-Capillary: 150 mg/dL — ABNORMAL HIGH (ref 65–99)

## 2018-05-17 LAB — BASIC METABOLIC PANEL
Anion gap: 13 (ref 5–15)
BUN: 59 mg/dL — AB (ref 6–20)
CHLORIDE: 85 mmol/L — AB (ref 101–111)
CO2: 31 mmol/L (ref 22–32)
CREATININE: 1.52 mg/dL — AB (ref 0.44–1.00)
Calcium: 9.4 mg/dL (ref 8.9–10.3)
GFR calc Af Amer: 37 mL/min — ABNORMAL LOW (ref >60)
GFR calc non Af Amer: 32 mL/min — ABNORMAL LOW (ref >60)
Glucose, Bld: 112 mg/dL — ABNORMAL HIGH (ref 65–99)
Potassium: 4.1 mmol/L (ref 3.5–5.1)
Sodium: 129 mmol/L — ABNORMAL LOW (ref 135–145)

## 2018-05-17 LAB — CBC
HCT: 28.1 % — ABNORMAL LOW (ref 36.0–46.0)
Hemoglobin: 9 g/dL — ABNORMAL LOW (ref 12.0–15.0)
MCH: 26.1 pg (ref 26.0–34.0)
MCHC: 32 g/dL (ref 30.0–36.0)
MCV: 81.4 fL (ref 78.0–100.0)
Platelets: 307 10*3/uL (ref 150–400)
RBC: 3.45 MIL/uL — ABNORMAL LOW (ref 3.87–5.11)
RDW: 15.4 % (ref 11.5–15.5)
WBC: 11.8 10*3/uL — AB (ref 4.0–10.5)

## 2018-05-17 LAB — MAGNESIUM: Magnesium: 2.5 mg/dL — ABNORMAL HIGH (ref 1.7–2.4)

## 2018-05-17 LAB — PROCALCITONIN: Procalcitonin: 0.1 ng/mL

## 2018-05-17 MED ORDER — IPRATROPIUM BROMIDE 0.02 % IN SOLN: 0.5000 mg | Freq: Four times a day (QID) | RESPIRATORY_TRACT | Status: DC | PRN

## 2018-05-17 MED ORDER — LEVALBUTEROL HCL 1.25 MG/0.5ML IN NEBU: 1.2500 mg | INHALATION_SOLUTION | Freq: Four times a day (QID) | RESPIRATORY_TRACT | Status: DC | PRN

## 2018-05-17 NOTE — Progress Notes (Signed)
Pt placed back on BIPAP due to increase of SOB while asleep tonight. Pt unable to clear out congestion which make her more anxious. Ativan PRN was given and restart Bipap with RT. Will continue to monitor.

## 2018-05-17 NOTE — Care Management Note (Addendum)
Case Management Note  Patient Details  Name: Dominique Clark MRN: [id number] Date of Birth: 1941-05-30  Subjective/Objective:   From home with son, hs home oxygen, presents with copd ex.  iv solumedrol, off of cardizem drip now on po cardizem, Await pt eval. Patient has had [facility] in the past.  [facility] referral for EMMI COPD , patient agreeable per Gerre Scull note.                   Action/Plan: NCM will follow for dc needs.   Expected Discharge Date:                  Expected Discharge Plan:  Home w Home Health Services  In-House Referral:     Discharge planning Services  CM Consult  Post Acute Care Choice:    Choice offered to:     DME Arranged:    DME Agency:     HH Arranged:    HH Agency:     Status of Service:  In process, will continue to follow  If discussed at Long Length of Stay Meetings, dates discussed:    Additional Comments:  Leone Haven, RN 05/17/2018, 4:00 PM

## 2018-05-17 NOTE — Consult Note (Addendum)
   [facility] [place] CM Inpatient Consult   05/17/2018  Dominique Clark 02/18/1941 [id number]    Inpatient RNCM requested writer to screen patient for potential [facility] Care Management services for COPD.  Went to bedside to speak with Dominique Clark about [facility] Care Management engagement.  Dominique Clark denies having any [facility] Care Management needs.  States she has [facility] for home health. States her son lives with her.  Denies having concerns with medications. Uses [facility] mail order pharmacy. Confirmed Primary Care MD is Dr. Para March (office listed as doing transition of care call post discharge)  Dominique Clark agreeable to [facility] COPD calls. Provided [facility] Care Management brochure with 24-nurse advice line magnet.  Will request patient to be assigned for COPD EMMI.  Will make inpatient RNCM aware of above notes.   Raiford Noble, MSN-Ed, RN,BSN [facility] Liaison [phone]

## 2018-05-17 NOTE — Progress Notes (Addendum)
0715 Pt sleeping on bipap, easy to arouse, NAD, fall precautions in place, WCTM.   0800 Pt assessed see flow sheet. No needs at this time.   0945 Pt awake, removed bipap and placed on [place]. Tolerating well. Pt medicated per MAR, pt has hard time swallowing large pills, crushed in applesauce so pt could tolerate. No acute distress, no complaints, WCTM.   1145 Pt assisted up to recliner. Tolerated well. Getting ready for lunch.   1400 Pt still sitting up in recliner, stated she, "fells better". Asking for ice cream, able to tolerate meds. No needs at this time, WCTM.   1600 Pt resting in recliner, asked RN to put her feet up, elevated and reclined chair. NAD, refusing to get back into bed at this time. Fall precautions in place, WCTM.   1800 Pt still sitting up in recliner, finished with dinner, no complaints, NAD, awaiting night RN for shift change.

## 2018-05-17 NOTE — Care Management Important Message (Signed)
Important Message  Patient Details  Name: Dominique Clark MRN: [id number] Date of Birth: October 21, 1941   Medicare Important Message Given:  Yes    Leone Haven, RN 05/17/2018, 4:36 PM

## 2018-05-17 NOTE — Progress Notes (Signed)
PROGRESS NOTE    Dominique Clark  ZOX:[id number] DOB: [birth year] DOA: 05/12/2018 PCP: [name], MD   Brief Narrative:  77 year old female with history of hyperlipidemia, diet-controlled diabetes mellitus type 2, COPD, asthma, GERD, atrial fibrillation on Eliquis, CKD stage III, TIA came to the hospital with complains of shortness of breath and cough.  Upon admission she was diagnosed with COPD exacerbation.  Patient reported that she had some sore throat and possibly contact with someone with strep throat therefore was checked which was negative.  Her respiratory panel was negative for influenza but did show parainfluenza positive.  Upon admission she was started on IV Solu-Medrol and scheduled bronchodilators.  Her home bronchodilator including Dulera was continued.     Assessment & Plan:   Principal Problem:   COPD exacerbation (HCC) Active Problems:   Essential hypertension   TIA (transient ischemic attack)   Atrial fibrillation with RVR (HCC)   Type II diabetes mellitus with renal manifestations (HCC)   GERD (gastroesophageal reflux disease)   CKD (chronic kidney disease), stage III (HCC)   Acute on chronic respiratory failure with hypoxia (HCC)   Normocytic anemia  Acute respiratory distress with hypoxia, slightly improved.  Mild to moderate acute exacerbation of mild persistent COPD Upper respiratory infection with parainfluenza, slightly worse today - Continue Solu-Medrol 40 mg IV every 8 hours scheduled and as needed bronchodilators.  Continue Dulera. -Continue Levaquin for another day.  Encouraged to use incentive spirometry and flutter valve - Try and wean her off BiPAP today.  Low-dose Ativan if necessary for anxiety. - Supplemental oxygen as needed, uses 2 L nasal cannula at home, maintain oxygen saturation greater than 90%.  Atrial fibrillation with RVR, improved - Patient is doing Bartl better on Cardizem 120 mg every 8 hours.  Heart rate is better  controlled.  Keep her off Cardizem drip, if necessary will restart. -Continue Eliquis  Leukocytosis - Improving, this is combination of her being on steroids and possible underlying infection. -Procalcitonin is improved 0.1.  Will discontinue Levaquin in next 24 hours.  History of diabetes mellitus type 2 -Accu-Cheks and sliding scale  Hyperlipidemia -Continue atorvastatin 20 mg daily  History of TIA -No residual deficits.  Continue Eliquis and statin  CKD stage III -Renal function appears to be stable  Essential hypertension -Continue Cardizem and metoprolol  DVT prophylaxis: Continue Eliquis Code Status: Full code  Family Communication:  none at bedside Disposition Plan: Maintain inpatient stay for another 24-48 hours  Consultants:   None  Procedures:   None  Antimicrobials:   Levaquin   Subjective: Patient is on BiPAP this morning feeling Dames better but still has exertional shortness of breath  Review of Systems Otherwise negative except as per HPI, including: HEENT/EYES = negative for pain, redness, loss of vision, double vision, blurred vision, loss of hearing, sore throat, hoarseness, dysphagia Cardiovascular= negative for chest pain, palpitation, murmurs, lower extremity swelling Respiratory/lungs= negative for  hemoptysis, wheezing, mucus production Gastrointestinal= negative for nausea, vomiting,, abdominal pain, melena, hematemesis Genitourinary= negative for Dysuria, Hematuria, Change in Urinary Frequency MSK = Negative for arthralgia, myalgias, Back Pain, Joint swelling  Neurology= Negative for headache, seizures, numbness, tingling  Psychiatry= Negative for anxiety, depression, suicidal and homocidal ideation Allergy/Immunology= Medication/Food allergy as listed  Skin= Negative for Rash, lesions, ulcers, itching   Objective: Vitals:   05/17/18 0437 05/17/18 0552 05/17/18 0809 05/17/18 0851  BP: (!) 141/64 (!) 160/68 120/60   Pulse: 74 77 81  Resp: Temp:      TempSrc:      SpO2: 100% 90%  100%  Weight:      Height:        Intake/Output Summary (Last 24 hours) at 05/17/2018 1059 Last data filed at 05/17/2018 1000 Gross per 24 hour  Intake 838.17 ml  Output 600 ml  Net 238.17 ml   Filed Weights   05/13/18 0925  Weight: 63.9 kg (140 lb 14 oz)    Examination: Constitutional: Slightly uncomfortable as she is on BiPAP. Eyes: PERRL, lids and conjunctivae normal ENMT: Mucous membranes are moist. Posterior pharynx clear of any exudate or lesions.Normal dentition.  Neck: normal, supple, no masses, no thyromegaly Respiratory: Bilateral diminished breath sounds with some expiratory wheezing-appears to have improved from yesterday Cardiovascular: Regular rate and rhythm, no murmurs / rubs / gallops. No extremity edema. 2+ pedal pulses. No carotid bruits.  Abdomen: no tenderness, no masses palpated. No hepatosplenomegaly. Bowel sounds positive.  Musculoskeletal: no clubbing / cyanosis. No joint deformity upper and lower extremities. Good ROM, no contractures. Normal muscle tone.  Skin: no rashes, lesions, ulcers. No induration Neurologic: CN 2-12 grossly intact. Sensation intact, DTR normal. Strength 5/5 in all 4.  Psychiatric: Normal judgment and insight. Alert and oriented x 3. Normal mood.    Data Reviewed:   CBC: Recent Labs  Lab 05/12/18 2233 05/15/18 0334 05/16/18 0603 05/17/18 0407  WBC 11.0* 24.5* 15.3* 11.8*  HGB 8.8* 9.2* 9.2* 9.0*  HCT 29.6* 30.3* 29.7* 28.1*  MCV 87.8 86.3 83.4 81.4  PLT 334 347 344 307   Basic Metabolic Panel: Recent Labs  Lab 05/12/18 2233 05/15/18 0334 05/16/18 0603 05/17/18 0407  NA 136 137 131* 129*  K 4.7 4.4 4.4 4.1  CL 98* 93* 87* 85*  CO2 28 32 30 31  GLUCOSE 175* 141* 183* 112*  BUN 15 32* 53* 59*  CREATININE 1.26* 1.40* 1.57* 1.52*  CALCIUM 9.3 9.5 9.4 9.4  MG  --  2.0 2.1 2.5*   GFR: Estimated Creatinine Clearance: 26.8 mL/min (A) (by C-G formula  based on SCr of 1.52 mg/dL (H)). Liver Function Tests: Recent Labs  Lab 05/12/18 2233  AST 35  ALT 20  ALKPHOS 65  BILITOT 0.4  PROT 7.1  ALBUMIN 3.8   No results for input(s): LIPASE, AMYLASE in the last 168 hours. No results for input(s): AMMONIA in the last 168 hours. Coagulation Profile: No results for input(s): INR, PROTIME in the last 168 hours. Cardiac Enzymes: No results for input(s): CKTOTAL, CKMB, CKMBINDEX, TROPONINI in the last 168 hours. BNP (last 3 results) No results for input(s): PROBNP in the last 8760 hours. HbA1C: No results for input(s): HGBA1C in the last 72 hours. CBG: Recent Labs  Lab 05/16/18 0727 05/16/18 1145 05/16/18 1733 05/16/18 2119 05/17/18 0815  GLUCAP 161* 118* 114* 113* 98   Lipid Profile: No results for input(s): CHOL, HDL, LDLCALC, TRIG, CHOLHDL, LDLDIRECT in the last 72 hours. Thyroid Function Tests: No results for input(s): TSH, T4TOTAL, FREET4, T3FREE, THYROIDAB in the last 72 hours. Anemia Panel: No results for input(s): VITAMINB12, FOLATE, FERRITIN, TIBC, IRON, RETICCTPCT in the last 72 hours. Sepsis Labs: Recent Labs  Lab 05/13/18 1012 05/17/18 0407  PROCALCITON 0.15 0.10    Recent Results (from the past 240 hour(s))  Respiratory Panel by PCR     Status: Abnormal   Collection Time: 05/13/18 12:46 AM  Result Value Ref Range Status   Adenovirus NOT DETECTED NOT  DETECTED Final   Coronavirus 229E NOT DETECTED NOT DETECTED Final   Coronavirus HKU1 NOT DETECTED NOT DETECTED Final   Coronavirus NL63 NOT DETECTED NOT DETECTED Final   Coronavirus OC43 NOT DETECTED NOT DETECTED Final   Metapneumovirus NOT DETECTED NOT DETECTED Final   Rhinovirus / Enterovirus NOT DETECTED NOT DETECTED Final   Influenza A NOT DETECTED NOT DETECTED Final   Influenza B NOT DETECTED NOT DETECTED Final   Parainfluenza Virus 1 NOT DETECTED NOT DETECTED Final   Parainfluenza Virus 2 NOT DETECTED NOT DETECTED Final   Parainfluenza Virus 3 DETECTED  (A) NOT DETECTED Final   Parainfluenza Virus 4 NOT DETECTED NOT DETECTED Final   Respiratory Syncytial Virus NOT DETECTED NOT DETECTED Final   Bordetella pertussis NOT DETECTED NOT DETECTED Final   Chlamydophila pneumoniae NOT DETECTED NOT DETECTED Final   Mycoplasma pneumoniae NOT DETECTED NOT DETECTED Final    Comment: Performed at [facility], [address] [place], [place] [address]  Group A Strep by PCR     Status: None   Collection Time: 05/13/18 12:46 AM  Result Value Ref Range Status   Group A Strep by PCR NOT DETECTED NOT DETECTED Final    Comment: Performed at [facility], [address] [place], [place] [address]  Culture, blood (routine x 2) Call MD if unable to obtain prior to antibiotics being given     Status: None (Preliminary result)   Collection Time: 05/13/18  2:03 AM  Result Value Ref Range Status   Specimen Description BLOOD LEFT ANTECUBITAL  Final   Special Requests   Final    BOTTLES DRAWN AEROBIC AND ANAEROBIC Blood Culture adequate volume   Culture   Final    NO GROWTH 3 DAYS Performed at [facility], [address], [place], [place] [address]    Report Status PENDING  Incomplete  Culture, blood (routine x 2) Call MD if unable to obtain prior to antibiotics being given     Status: None (Preliminary result)   Collection Time: 05/13/18  2:12 AM  Result Value Ref Range Status   Specimen Description BLOOD LEFT FOREARM  Final   Special Requests   Final    BOTTLES DRAWN AEROBIC AND ANAEROBIC Blood Culture adequate volume   Culture   Final    NO GROWTH 3 DAYS Performed at [facility], [address] [place], [place] [address]    Report Status PENDING  Incomplete  Urine culture     Status: Abnormal   Collection Time: 05/13/18  3:57 AM  Result Value Ref Range Status   Specimen Description URINE, CLEAN CATCH  Final   Special Requests   Final    NONE Performed at [facility], [address] [place], [place] [address]     Culture 20,000 COLONIES/mL STAPHYLOCOCCUS HAEMOLYTICUS (A)  Final   Report Status 05/15/2018 FINAL  Final   Organism ID, Bacteria STAPHYLOCOCCUS HAEMOLYTICUS (A)  Final      Susceptibility   Staphylococcus haemolyticus - MIC*    CIPROFLOXACIN >=8 RESISTANT Resistant     GENTAMICIN <=0.5 SENSITIVE Sensitive     NITROFURANTOIN 32 SENSITIVE Sensitive     OXACILLIN >=4 RESISTANT Resistant     TETRACYCLINE >=16 RESISTANT Resistant     VANCOMYCIN <=0.5 SENSITIVE Sensitive     TRIMETH/SULFA <=10 SENSITIVE Sensitive     CLINDAMYCIN >=8 RESISTANT Resistant     RIFAMPIN <=0.5 SENSITIVE Sensitive     Inducible Clindamycin  NEGATIVE Sensitive     * 20,000 COLONIES/mL STAPHYLOCOCCUS HAEMOLYTICUS  MRSA PCR Screening     Status: None   Collection Time: 05/13/18  9:06 AM  Result Value Ref Range Status   MRSA by PCR NEGATIVE NEGATIVE Final    Comment:        The GeneXpert MRSA Assay (FDA approved for NASAL specimens only), is one component of a comprehensive MRSA colonization surveillance program. It is not intended to diagnose MRSA infection nor to guide or monitor treatment for MRSA infections. Performed at [facility], [address], [place], [place] [address]          Radiology Studies: No results found.      Scheduled Meds: . apixaban  5 mg Oral BID  . atorvastatin  20 mg Oral Daily  . cholecalciferol  1,000 Units Oral Daily  . diltiazem  120 mg Oral Q8H  . insulin aspart  0-5 Units Subcutaneous QHS  . insulin aspart  0-9 Units Subcutaneous TID WC  . ipratropium  0.5 mg Nebulization Q6H  . levalbuterol  1.25 mg Nebulization Q6H  . mouth rinse  15 mL Mouth Rinse BID  . methylPREDNISolone (SOLU-MEDROL) injection  40 mg Intravenous Q8H  . metoprolol succinate  25 mg Oral Daily  . mometasone-formoterol  2 puff Inhalation BID  . pantoprazole  40 mg Oral Daily  . vitamin C  1,000 mg Oral BID   Continuous Infusions: . diltiazem (CARDIZEM) infusion Stopped (05/16/18  1500)     LOS: 4 days    I have spent 30 minutes face to face with the patient and on the ward discussing the patients care, assessment, plan and disposition with other care givers. >50% of the time was devoted counseling the patient about the risks and benefits of treatment and coordinating care.     Ankit Joline Maxcy, MD [facility] Pager [phone]   If 7PM-7AM, please contact night-coverage [email or web address] Password [email or web address] 05/17/2018, 10:59 AM

## 2018-05-18 LAB — CBC
HEMATOCRIT: 28.4 % — AB (ref 36.0–46.0)
Hemoglobin: 9 g/dL — ABNORMAL LOW (ref 12.0–15.0)
MCH: 26.1 pg (ref 26.0–34.0)
MCHC: 31.7 g/dL (ref 30.0–36.0)
MCV: 82.3 fL (ref 78.0–100.0)
Platelets: 302 10*3/uL (ref 150–400)
RBC: 3.45 MIL/uL — ABNORMAL LOW (ref 3.87–5.11)
RDW: 15.3 % (ref 11.5–15.5)
WBC: 14 10*3/uL — ABNORMAL HIGH (ref 4.0–10.5)

## 2018-05-18 LAB — MAGNESIUM: MAGNESIUM: 2.4 mg/dL (ref 1.7–2.4)

## 2018-05-18 LAB — BASIC METABOLIC PANEL
Anion gap: 11 (ref 5–15)
BUN: 53 mg/dL — AB (ref 6–20)
CHLORIDE: 87 mmol/L — AB (ref 101–111)
CO2: 34 mmol/L — ABNORMAL HIGH (ref 22–32)
CREATININE: 1.36 mg/dL — AB (ref 0.44–1.00)
Calcium: 9.4 mg/dL (ref 8.9–10.3)
GFR calc Af Amer: 42 mL/min — ABNORMAL LOW (ref >60)
GFR, EST NON AFRICAN AMERICAN: 36 mL/min — AB (ref >60)
GLUCOSE: 140 mg/dL — AB (ref 65–99)
POTASSIUM: 4.4 mmol/L (ref 3.5–5.1)
Sodium: 132 mmol/L — ABNORMAL LOW (ref 135–145)

## 2018-05-18 LAB — GLUCOSE, CAPILLARY
Glucose-Capillary: 149 mg/dL — ABNORMAL HIGH (ref 65–99)
Glucose-Capillary: 242 mg/dL — ABNORMAL HIGH (ref 65–99)
Glucose-Capillary: 181 mg/dL — ABNORMAL HIGH (ref 65–99)
Glucose-Capillary: 181 mg/dL — ABNORMAL HIGH (ref 65–99)

## 2018-05-18 LAB — CULTURE, BLOOD (ROUTINE X 2)
CULTURE: NO GROWTH
Culture: NO GROWTH
SPECIAL REQUESTS: ADEQUATE
SPECIAL REQUESTS: ADEQUATE

## 2018-05-18 MED ORDER — LACTULOSE 10 GM/15ML PO SOLN
20.0000 g | Freq: Two times a day (BID) | ORAL | Status: DC | PRN
  Administered 2018-05-18: 20 g via ORAL
  Filled 2018-05-18: qty 30

## 2018-05-18 NOTE — Progress Notes (Signed)
MD made aware that pt has not had BM in 6 days, prn medications given this am.

## 2018-05-18 NOTE — Progress Notes (Signed)
Inpatient Rehabilitation  Per PT request, patient was screened by Fae Pippin for appropriateness for an Inpatient Acute Rehab consult.  Given diagnosis of COPD exacerbation with current level of function being Min A it is unlikely that [facility] would approve an IP Rehab stay.  Recommend SNF or home with home health for post acute rehab.  Call if questions.   Charlane Ferretti., CCC/SLP Admission Coordinator  [facility] Inpatient Rehabilitation  Cell [phone]

## 2018-05-18 NOTE — Progress Notes (Signed)
PROGRESS NOTE    Dominique Clark  ZOX:[id number] DOB: 11/03/1941 DOA: 05/12/2018 PCP: [name], MD   Brief Narrative:  77 year old female with history of hyperlipidemia, diet-controlled diabetes mellitus type 2, COPD, asthma, GERD, atrial fibrillation on Eliquis, CKD stage III, TIA came to the hospital with complains of shortness of breath and cough.  Upon admission she was diagnosed with COPD exacerbation.  Patient reported that she had some sore throat and possibly contact with someone with strep throat therefore was checked which was negative.  Her respiratory panel was negative for influenza but did show parainfluenza positive.  Upon admission she was started on IV Solu-Medrol and scheduled bronchodilators.  Her home bronchodilator including Dulera was continued.  During hospital stay she had acutely decompensated and had to be placed on BiPAP.  Currently she has been doing well off of BiPAP.  Her hospital stay was also complicated by atrial fibrillation with RVR therefore I had increased her Cardizem from 30 mg every 8 hours gradually 120 mg every 8 hours, Cardizem drip was weaned off.   Assessment & Plan:   Principal Problem:   COPD exacerbation (HCC) Active Problems:   Essential hypertension   TIA (transient ischemic attack)   Atrial fibrillation with RVR (HCC)   Type II diabetes mellitus with renal manifestations (HCC)   GERD (gastroesophageal reflux disease)   CKD (chronic kidney disease), stage III (HCC)   Acute on chronic respiratory failure with hypoxia (HCC)   Normocytic anemia  Acute respiratory distress with hypoxia, slightly improved Mild to moderate acute exacerbation of mild persistent COPD Upper respiratory infection with parainfluenza, slightly worse today - Encouraged to use incentive spirometry and flutter valve, instructed her how to use it. - This morning she is doing better off BiPAP, no longer using abdominal muscles to breathe.  Still has quite  diminished breath sounds therefore we will continue Solu-Medrol every 8 hours along with scheduled and as needed bronchodilators.  Continue Dulera. - Supplemental oxygen as needed, uses 2 L nasal cannula at home, maintain oxygen saturation greater than 90%. -Procalcitonin is 0.10, will discontinue her Levaquin today. - Eventually she will need another ambulatory pulse ox.  Atrial fibrillation with RVR, greatly improved - Cardizem has been increased 120 mg every 8 hours.  Cardizem drip weaned off.  Heart rate remains in 80s now. -Continue Eliquis  Leukocytosis - Improving, this is combination of her being on steroids and possible underlying infection. -Procalcitonin is improved 0.1.  Levaquin has been discontinued  History of diabetes mellitus type 2 -Accu-Cheks and sliding scale  Hyperlipidemia -Continue atorvastatin 20 mg daily  History of TIA -No residual deficits.  Continue Eliquis and statin  CKD stage III -Renal function appears to be stable  Essential hypertension -Continue Cardizem and metoprolol  DVT prophylaxis: Continue Eliquis Code Status: Full code  Family Communication:  none at bedside Disposition Plan: Due to her slow progression towards improvement, we will keep her in the hospital for another 24-48 hours.  Consultants:   None  Procedures:   None  Antimicrobials:   Levaquin stopped 4/20   Subjective: She tolerated BiPAP overnight.  This morning she is off BiPAP and appears to be more comfortable.  Review of Systems Otherwise negative except as per HPI, including: HEENT/EYES = negative for pain, redness, loss of vision, double vision, blurred vision, loss of hearing, sore throat, hoarseness, dysphagia Cardiovascular= negative for chest pain, palpitation, murmurs, lower extremity swelling Respiratory/lungs= negative for  cough, hemoptysis, wheezing, mucus  production Gastrointestinal= negative for nausea, vomiting,, abdominal pain, melena,  hematemesis Genitourinary= negative for Dysuria, Hematuria, Change in Urinary Frequency MSK = Negative for arthralgia, myalgias, Back Pain, Joint swelling  Neurology= Negative for headache, seizures, numbness, tingling  Psychiatry= Negative for anxiety, depression, suicidal and homocidal ideation Allergy/Immunology= Medication/Food allergy as listed  Skin= Negative for Rash, lesions, ulcers, itching    Objective: Vitals:   05/18/18 0404 05/18/18 0720 05/18/18 0749 05/18/18 1000  BP: (!) 110/49 138/62  (!) 139/49  Pulse: 69 87  82  Resp: 15 19  (!) 21  Temp: 97.8 F (36.6 C) 98.3 F (36.8 C)    TempSrc: Oral     SpO2: 100% 99% 100% 94%  Weight:      Height:        Intake/Output Summary (Last 24 hours) at 05/18/2018 1119 Last data filed at 05/18/2018 0830 Gross per 24 hour  Intake 580 ml  Output 1100 ml  Net -520 ml   Filed Weights   05/13/18 0925  Weight: 63.9 kg (140 lb 14 oz)    Examination: Constitutional: NAD, calm, comfortable, comfortable on 2.5 L nasal cannula. Eyes: PERRL, lids and conjunctivae normal ENMT: Mucous membranes are moist. Posterior pharynx clear of any exudate or lesions.Normal dentition.  Neck: normal, supple, no masses, no thyromegaly Respiratory: Quite diminished breath sounds but improved from yesterday. Cardiovascular: Regular rate and rhythm, no murmurs / rubs / gallops. No extremity edema. 2+ pedal pulses. No carotid bruits.  Abdomen: no tenderness, no masses palpated. No hepatosplenomegaly. Bowel sounds positive.  Musculoskeletal: no clubbing / cyanosis. No joint deformity upper and lower extremities. Good ROM, no contractures. Normal muscle tone.  Skin: no rashes, lesions, ulcers. No induration Neurologic: CN 2-12 grossly intact. Sensation intact, DTR normal. Strength 5/5 in all 4.  Psychiatric: Normal judgment and insight. Alert and oriented x 3. Normal mood.    Data Reviewed:   CBC: Recent Labs  Lab 05/12/18 2233 05/15/18 0334  05/16/18 0603 05/17/18 0407 05/18/18 0215  WBC 11.0* 24.5* 15.3* 11.8* 14.0*  HGB 8.8* 9.2* 9.2* 9.0* 9.0*  HCT 29.6* 30.3* 29.7* 28.1* 28.4*  MCV 87.8 86.3 83.4 81.4 82.3  PLT 334 347 344 307 302   Basic Metabolic Panel: Recent Labs  Lab 05/12/18 2233 05/15/18 0334 05/16/18 0603 05/17/18 0407 05/18/18 0215  NA 136 137 131* 129* 132*  K 4.7 4.4 4.4 4.1 4.4  CL 98* 93* 87* 85* 87*  CO2 28 32 30 31 34*  GLUCOSE 175* 141* 183* 112* 140*  BUN 15 32* 53* 59* 53*  CREATININE 1.26* 1.40* 1.57* 1.52* 1.36*  CALCIUM 9.3 9.5 9.4 9.4 9.4  MG  --  2.0 2.1 2.5* 2.4   GFR: Estimated Creatinine Clearance: 29.9 mL/min (A) (by C-G formula based on SCr of 1.36 mg/dL (H)). Liver Function Tests: Recent Labs  Lab 05/12/18 2233  AST 35  ALT 20  ALKPHOS 65  BILITOT 0.4  PROT 7.1  ALBUMIN 3.8   No results for input(s): LIPASE, AMYLASE in the last 168 hours. No results for input(s): AMMONIA in the last 168 hours. Coagulation Profile: No results for input(s): INR, PROTIME in the last 168 hours. Cardiac Enzymes: No results for input(s): CKTOTAL, CKMB, CKMBINDEX, TROPONINI in the last 168 hours. BNP (last 3 results) No results for input(s): PROBNP in the last 8760 hours. HbA1C: No results for input(s): HGBA1C in the last 72 hours. CBG: Recent Labs  Lab 05/17/18 0815 05/17/18 1137 05/17/18 1558 05/17/18 2107 05/18/18 9604  GLUCAP 98 118* 150* 148* 149*   Lipid Profile: No results for input(s): CHOL, HDL, LDLCALC, TRIG, CHOLHDL, LDLDIRECT in the last 72 hours. Thyroid Function Tests: No results for input(s): TSH, T4TOTAL, FREET4, T3FREE, THYROIDAB in the last 72 hours. Anemia Panel: No results for input(s): VITAMINB12, FOLATE, FERRITIN, TIBC, IRON, RETICCTPCT in the last 72 hours. Sepsis Labs: Recent Labs  Lab 05/13/18 1012 05/17/18 0407  PROCALCITON 0.15 0.10    Recent Results (from the past 240 hour(s))  Respiratory Panel by PCR     Status: Abnormal   Collection Time:  05/13/18 12:46 AM  Result Value Ref Range Status   Adenovirus NOT DETECTED NOT DETECTED Final   Coronavirus 229E NOT DETECTED NOT DETECTED Final   Coronavirus HKU1 NOT DETECTED NOT DETECTED Final   Coronavirus NL63 NOT DETECTED NOT DETECTED Final   Coronavirus OC43 NOT DETECTED NOT DETECTED Final   Metapneumovirus NOT DETECTED NOT DETECTED Final   Rhinovirus / Enterovirus NOT DETECTED NOT DETECTED Final   Influenza A NOT DETECTED NOT DETECTED Final   Influenza B NOT DETECTED NOT DETECTED Final   Parainfluenza Virus 1 NOT DETECTED NOT DETECTED Final   Parainfluenza Virus 2 NOT DETECTED NOT DETECTED Final   Parainfluenza Virus 3 DETECTED (A) NOT DETECTED Final   Parainfluenza Virus 4 NOT DETECTED NOT DETECTED Final   Respiratory Syncytial Virus NOT DETECTED NOT DETECTED Final   Bordetella pertussis NOT DETECTED NOT DETECTED Final   Chlamydophila pneumoniae NOT DETECTED NOT DETECTED Final   Mycoplasma pneumoniae NOT DETECTED NOT DETECTED Final    Comment: Performed at [facility], [address], [place], [place] [address]  Group A Strep by PCR     Status: None   Collection Time: 05/13/18 12:46 AM  Result Value Ref Range Status   Group A Strep by PCR NOT DETECTED NOT DETECTED Final    Comment: Performed at [facility], [address] [place], [place] [address]  Culture, blood (routine x 2) Call MD if unable to obtain prior to antibiotics being given     Status: None (Preliminary result)   Collection Time: 05/13/18  2:03 AM  Result Value Ref Range Status   Specimen Description BLOOD LEFT ANTECUBITAL  Final   Special Requests   Final    BOTTLES DRAWN AEROBIC AND ANAEROBIC Blood Culture adequate volume   Culture   Final    NO GROWTH 4 DAYS Performed at [facility], [address], [place], [place] [address]    Report Status PENDING  Incomplete  Culture, blood (routine x 2) Call MD if unable to obtain prior to antibiotics being given     Status: None (Preliminary  result)   Collection Time: 05/13/18  2:12 AM  Result Value Ref Range Status   Specimen Description BLOOD LEFT FOREARM  Final   Special Requests   Final    BOTTLES DRAWN AEROBIC AND ANAEROBIC Blood Culture adequate volume   Culture   Final    NO GROWTH 4 DAYS Performed at [facility], [address] [place], [place] [address]    Report Status PENDING  Incomplete  Urine culture     Status: Abnormal   Collection Time: 05/13/18  3:57 AM  Result Value Ref Range Status   Specimen Description URINE, CLEAN CATCH  Final   Special Requests   Final    NONE Performed at [facility], [address] [place], [place] [address]    Culture 20,000  COLONIES/mL STAPHYLOCOCCUS HAEMOLYTICUS (A)  Final   Report Status 05/15/2018 FINAL  Final   Organism ID, Bacteria STAPHYLOCOCCUS HAEMOLYTICUS (A)  Final      Susceptibility   Staphylococcus haemolyticus - MIC*    CIPROFLOXACIN >=8 RESISTANT Resistant     GENTAMICIN <=0.5 SENSITIVE Sensitive     NITROFURANTOIN 32 SENSITIVE Sensitive     OXACILLIN >=4 RESISTANT Resistant     TETRACYCLINE >=16 RESISTANT Resistant     VANCOMYCIN <=0.5 SENSITIVE Sensitive     TRIMETH/SULFA <=10 SENSITIVE Sensitive     CLINDAMYCIN >=8 RESISTANT Resistant     RIFAMPIN <=0.5 SENSITIVE Sensitive     Inducible Clindamycin NEGATIVE Sensitive     * 20,000 COLONIES/mL STAPHYLOCOCCUS HAEMOLYTICUS  MRSA PCR Screening     Status: None   Collection Time: 05/13/18  9:06 AM  Result Value Ref Range Status   MRSA by PCR NEGATIVE NEGATIVE Final    Comment:        The GeneXpert MRSA Assay (FDA approved for NASAL specimens only), is one component of a comprehensive MRSA colonization surveillance program. It is not intended to diagnose MRSA infection nor to guide or monitor treatment for MRSA infections. Performed at [facility], [address] [place], [place] [address]          Radiology Studies: No results found.      Scheduled Meds: .  apixaban  5 mg Oral BID  . atorvastatin  20 mg Oral Daily  . cholecalciferol  1,000 Units Oral Daily  . diltiazem  120 mg Oral Q8H  . insulin aspart  0-5 Units Subcutaneous QHS  . insulin aspart  0-9 Units Subcutaneous TID WC  . mouth rinse  15 mL Mouth Rinse BID  . methylPREDNISolone (SOLU-MEDROL) injection  40 mg Intravenous Q8H  . metoprolol succinate  25 mg Oral Daily  . mometasone-formoterol  2 puff Inhalation BID  . pantoprazole  40 mg Oral Daily  . vitamin C  1,000 mg Oral BID   Continuous Infusions:    LOS: 5 days    I have spent 30 minutes face to face with the patient and on the ward discussing the patients care, assessment, plan and disposition with other care givers. >50% of the time was devoted counseling the patient about the risks and benefits of treatment and coordinating care.     Kearie Mennen Joline Maxcy, MD [facility] Pager [phone]   If 7PM-7AM, please contact night-coverage [email or web address] Password [email or web address] 05/18/2018, 11:19 AM

## 2018-05-18 NOTE — Progress Notes (Signed)
CSW received consult regarding PT recommendation of SNF at discharge.  Patient is refusing SNF. She stated that she would like to return home at discharge. She has worked with home health PT before and wants to be with her dog. Her son and his girlfriend live next door and provide her with assistance her as well as her brother. CSW emphasized that SNF would need to begin insurance authorization as soon as possible for SNF if she wanted it, but she continued to request discharge home. CSW offered home health but patient stated that she was not sure if she wanted home health. RNCM alerted.   CSW signing off.   Dominique Clark LCSWA [phone]

## 2018-05-18 NOTE — Discharge Instructions (Addendum)
Shortness of Breath, Adult Shortness of breath means you have trouble breathing. Your lungs are organs for breathing. Follow these instructions at home: Pay attention to any changes in your symptoms. Take these actions to help with your condition:  Do not smoke. Smoking can cause shortness of breath. If you need help to quit smoking, ask your doctor.  Avoid things that can make it harder to breathe, such as: ? Mold. ? Dust. ? Air pollution. ? Chemical smells. ? Things that can cause allergy symptoms (allergens), if you have allergies.  Keep your living space clean and free of mold and dust.  Rest as needed. Slowly return to your usual activities.  Take over-the-counter and prescription medicines, including oxygen and inhaled medicines, only as told by your doctor.  Keep all follow-up visits as told by your doctor. This is important.  Contact a doctor if:  Your condition does not get better as soon as expected.  You have a hard time doing your normal activities, even after you rest.  You have new symptoms. Get help right away if:  You have trouble breathing when you are resting.  You feel light-headed or you faint.  You have a cough that is not helped by medicines.  You cough up blood.  You have pain with breathing.  You have pain in your chest, arms, shoulders, or belly (abdomen).  You have a fever.  You cannot walk up stairs.  You cannot exercise the way you normally do. This information is not intended to replace advice given to you by your health care provider. Make sure you discuss any questions you have with your health care provider. Document Released: 06/02/2008 Document Revised: 01/01/2017 Document Reviewed: 01/01/2017 Elsevier Interactive Patient Education  2017 [employer]. Information on my medicine - ELIQUIS (apixaban)  This medication education was reviewed with me or my healthcare representative as part of my discharge preparation.   Why was  Eliquis prescribed for you? Eliquis was prescribed for you to reduce the risk of a blood clot forming that can cause a stroke if you have a medical condition called atrial fibrillation (a type of irregular heartbeat).  What do You need to know about Eliquis ? Take your Eliquis TWICE DAILY - one tablet in the morning and one tablet in the evening with or without food. If you have difficulty swallowing the tablet whole please discuss with your pharmacist how to take the medication safely.  Take Eliquis exactly as prescribed by your doctor and DO NOT stop taking Eliquis without talking to the doctor who prescribed the medication.  Stopping may increase your risk of developing a stroke.  Refill your prescription before you run out.  After discharge, you should have regular check-up appointments with your healthcare provider that is prescribing your Eliquis.  In the future your dose may need to be changed if your kidney function or weight changes by a significant amount or as you get older.  What do you do if you miss a dose? If you miss a dose, take it as soon as you remember on the same day and resume taking twice daily.  Do not take more than one dose of ELIQUIS at the same time to make up a missed dose.  Important Safety Information A possible side effect of Eliquis is bleeding. You should call your healthcare provider right away if you experience any of the following: ? Bleeding from an injury or your nose that does not stop. ? Unusual colored urine (  red or dark brown) or unusual colored stools (red or black). ? Unusual bruising for unknown reasons. ? A serious fall or if you hit your head (even if there is no bleeding).  Some medicines may interact with Eliquis and might increase your risk of bleeding or clotting while on Eliquis. To help avoid this, consult your healthcare provider or pharmacist prior to using any new prescription or non-prescription medications, including herbals,  vitamins, non-steroidal anti-inflammatory drugs (NSAIDs) and supplements.  This website has more information on Eliquis (apixaban): http://www.eliquis.com/eliquis/home

## 2018-05-18 NOTE — Progress Notes (Addendum)
0715 Bedside shift report, pt resting in bed on [place], tolerating well. Pt with no complaints this am. Updated with POC, fall precautions in place, WCTM.   0730 Pt assessed, see flow sheet. Pt A&Ox4 with some memory problems, confusion with day of week, date. Reoriented. Pt reports feeling, "better". Updated with POC for today, fall precautions in place, no needs at this time, [facility].   0930 Physical therapy here to work with pt. Pt up to chair, too weak to walk at this time. Purewick leaking, peri care performed and pad changed.   1000 Pt medicated per MAR, see flow sheet, pt provided yogurt, graham crackers and juice, ate all of it, tolerated well. Pt sitting up in recliner, NAD, no complaints, WCTM.   1230 Pt refusing to eat lunch, stated she ate, "a snack" and she's not hungry. WCTM.   1400 Pt still sitting up in recliner, at times sleeping. Stated, she's "wore out" today. Pt repositioned in chair. No other complaints, WCTM.   1415 Social worker in to speak to pt about rehab.

## 2018-05-18 NOTE — Evaluation (Signed)
Physical Therapy Evaluation Patient Details Name: Dominique Clark MRN: [id number] DOB: 02-19-1941 Today's Date: 05/18/2018   History of Present Illness  77 year old female with history of hyperlipidemia, diet-controlled diabetes mellitus type 2, COPD, asthma, GERD, atrial fibrillation on Eliquis, CKD stage III, TIA came to the hospital with complains of shortness of breath and cough.  Upon admission she was diagnosed with COPD exacerbation.   Clinical Impression  PTA pt was independent in mobility and most iADLs. Pt reports she has good family support for anything she needs. Pt is currently limited in her safe mobility by decreased safety awareness due to decreased knowledge of her deficits, as well as decreased strength, balance and endurance. Pt is currently min guard for bed mobility, minA for transfers, and stand step transfer to recliner. Pt also has decreased balance as she sat quickly on the EoB while she was standing for pericare when she let go of RW. Given pt's independent PLOF and pt's desire to work to go home pt would make an excellent candidate for CIR level rehab at d/c.      Follow Up Recommendations CIR    Equipment Recommendations  None recommended by PT    Recommendations for Other Services OT consult     Precautions / Restrictions Precautions Precautions: None Restrictions Weight Bearing Restrictions: No      Mobility  Bed Mobility Overal bed mobility: Needs Assistance Bed Mobility: Supine to Sit     Supine to sit: Min guard     General bed mobility comments: min guard for safety, requires increased time and vc for scooting to EoB  Transfers Overall transfer level: Needs assistance Equipment used: Rolling walker (2 wheeled) Transfers: Sit to/from Stand Sit to Stand: Min assist         General transfer comment: min A for power up and steadying in RW,   Ambulation/Gait Ambulation/Gait assistance: Min assist Ambulation Distance (Feet): 3  Feet Assistive device: Rolling walker (2 wheeled) Gait Pattern/deviations: Step-through pattern;Decreased step length - right;Decreased step length - left;Shuffle;Trunk flexed Gait velocity: slowed Gait velocity interpretation: <1.31 ft/sec, indicative of household ambulator General Gait Details: minA for mild instability with step transfer to recliner, vc for upright posture and proximity to RW      Balance Overall balance assessment: Needs assistance Sitting-balance support: No upper extremity supported;Feet supported Sitting balance-Leahy Scale: Fair     Standing balance support: Bilateral upper extremity supported Standing balance-Leahy Scale: Poor Standing balance comment: requires UE support on RW, let go of RW during pericare and sat back on bed due to instability                             Pertinent Vitals/Pain Pain Assessment: No/denies pain    Home Living Family/patient expects to be discharged to:: Private residence Living Arrangements: Alone Available Help at Discharge: Family(brother and son help out ) Type of Home: Mobile home Home Access: Ramped entrance     Home Layout: One level Home Equipment: Grab bars - toilet;Grab bars - tub/shower;Tub bench;Walker - 2 wheels;Cane - single point      Prior Function Level of Independence: Independent with assistive device(s)         Comments: sometimes uses RW for public ambulation     Hand Dominance        Extremity/Trunk Assessment   Upper Extremity Assessment Upper Extremity Assessment: Generalized weakness    Lower Extremity Assessment Lower Extremity Assessment: Generalized weakness  Communication   Communication: No difficulties  Cognition Arousal/Alertness: Awake/alert Behavior During Therapy: Flat affect Overall Cognitive Status: Within Functional Limits for tasks assessed                                        General Comments General comments (skin  integrity, edema, etc.): Pt on 2L O2 via [place] at baseline, currently on 2L O2 via , prior to activity SaO2 100%O2, HR 79, BP 123/52, after transferring to recliner SaO2 90%O2, HR 100bpm, BP 139/49        Assessment/Plan    PT Assessment Patient needs continued PT services  PT Problem List Decreased strength;Decreased activity tolerance;Decreased balance;Decreased mobility;Decreased knowledge of use of DME;Decreased safety awareness;Cardiopulmonary status limiting activity       PT Treatment Interventions Gait training;DME instruction;Functional mobility training;Therapeutic activities;Therapeutic exercise;Balance training;Cognitive remediation;Patient/family education    PT Goals (Current goals can be found in the Care Plan section)  Acute Rehab PT Goals Patient Stated Goal: go home PT Goal Formulation: With patient Time For Goal Achievement: 06/01/18 Potential to Achieve Goals: Fair    Frequency Min 3X/week   Barriers to discharge Decreased caregiver support         AM-PAC PT "6 Clicks" Daily Activity  Outcome Measure Difficulty turning over in bed (including adjusting bedclothes, sheets and blankets)?: A Tetrault Difficulty moving from lying on back to sitting on the side of the bed? : Unable Difficulty sitting down on and standing up from a chair with arms (e.g., wheelchair, bedside commode, etc,.)?: Unable Help needed moving to and from a bed to chair (including a wheelchair)?: A Melucci Help needed walking in hospital room?: A Lot Help needed climbing 3-5 steps with a railing? : Total 6 Click Score: 11    End of Session Equipment Utilized During Treatment: Gait belt;Oxygen Activity Tolerance: Patient limited by fatigue Patient left: in chair;with call bell/phone within reach;with nursing/sitter in room Nurse Communication: Mobility status PT Visit Diagnosis: Unsteadiness on feet (R26.81);Other abnormalities of gait and mobility (R26.89);Muscle weakness (generalized)  (M62.81);Difficulty in walking, not elsewhere classified (R26.2)    Time: 1610-9604 PT Time Calculation (min) (ACUTE ONLY): 20 min   Charges:   PT Evaluation $PT Eval Moderate Complexity: 1 Mod     PT G Codes:        Chelbi Herber B. Beverely Risen PT, DPT Acute Rehabilitation  [phone] Pager [phone]    Elon Alas Fleet 05/18/2018, 10:23 AM

## 2018-05-19 DIAGNOSIS — N179 Acute kidney failure, unspecified: Secondary | ICD-10-CM

## 2018-05-19 DIAGNOSIS — E1122 Type 2 diabetes mellitus with diabetic chronic kidney disease: Secondary | ICD-10-CM | POA: Diagnosis not present

## 2018-05-19 DIAGNOSIS — E1129 Type 2 diabetes mellitus with other diabetic kidney complication: Secondary | ICD-10-CM | POA: Diagnosis not present

## 2018-05-19 DIAGNOSIS — Z794 Long term (current) use of insulin: Secondary | ICD-10-CM | POA: Diagnosis not present

## 2018-05-19 DIAGNOSIS — E1165 Type 2 diabetes mellitus with hyperglycemia: Secondary | ICD-10-CM | POA: Diagnosis not present

## 2018-05-19 LAB — MAGNESIUM: Magnesium: 2.3 mg/dL (ref 1.7–2.4)

## 2018-05-19 LAB — CBC
HCT: 30.9 % — ABNORMAL LOW (ref 36.0–46.0)
Hemoglobin: 9.6 g/dL — ABNORMAL LOW (ref 12.0–15.0)
MCH: 25.7 pg — AB (ref 26.0–34.0)
MCHC: 31.1 g/dL (ref 30.0–36.0)
MCV: 82.8 fL (ref 78.0–100.0)
PLATELETS: 319 10*3/uL (ref 150–400)
RBC: 3.73 MIL/uL — ABNORMAL LOW (ref 3.87–5.11)
RDW: 15.1 % (ref 11.5–15.5)
WBC: 24.2 10*3/uL — ABNORMAL HIGH (ref 4.0–10.5)

## 2018-05-19 LAB — BASIC METABOLIC PANEL
Anion gap: 8 (ref 5–15)
BUN: 46 mg/dL — AB (ref 6–20)
CALCIUM: 9.4 mg/dL (ref 8.9–10.3)
CO2: 36 mmol/L — AB (ref 22–32)
CREATININE: 1.13 mg/dL — AB (ref 0.44–1.00)
Chloride: 89 mmol/L — ABNORMAL LOW (ref 101–111)
GFR calc Af Amer: 53 mL/min — ABNORMAL LOW (ref >60)
GFR calc non Af Amer: 46 mL/min — ABNORMAL LOW (ref >60)
GLUCOSE: 147 mg/dL — AB (ref 65–99)
Potassium: 4.3 mmol/L (ref 3.5–5.1)
Sodium: 133 mmol/L — ABNORMAL LOW (ref 135–145)

## 2018-05-19 LAB — GLUCOSE, CAPILLARY
GLUCOSE-CAPILLARY: 290 mg/dL — AB (ref 65–99)
GLUCOSE-CAPILLARY: 115 mg/dL — AB (ref 65–99)
Glucose-Capillary: 171 mg/dL — ABNORMAL HIGH (ref 65–99)
Glucose-Capillary: 189 mg/dL — ABNORMAL HIGH (ref 65–99)

## 2018-05-19 MED ORDER — SENNA 8.6 MG PO TABS
2.0000 | ORAL_TABLET | Freq: Every day | ORAL | Status: DC
  Administered 2018-05-20: 17.2 mg via ORAL
  Filled 2018-05-19 (×2): qty 2

## 2018-05-19 MED ORDER — METHYLPREDNISOLONE SODIUM SUCC 40 MG IJ SOLR
40.0000 mg | Freq: Two times a day (BID) | INTRAMUSCULAR | Status: DC
  Administered 2018-05-19: 40 mg via INTRAVENOUS
  Filled 2018-05-19: qty 1

## 2018-05-19 MED ORDER — POLYETHYLENE GLYCOL 3350 17 G PO PACK
17.0000 g | PACK | Freq: Every day | ORAL | Status: DC
  Administered 2018-05-20: 17 g via ORAL
  Filled 2018-05-19: qty 1

## 2018-05-19 NOTE — Progress Notes (Signed)
PROGRESS NOTE  Dominique Clark EAV:[id number] DOB: [birth year] DOA: 05/12/2018 PCP: [name], MD  HPI/Recap of past 24 hours: 77 year old female with history of hyperlipidemia, diet-controlled diabetes mellitus type 2, COPD, asthma, GERD, atrial fibrillation on Eliquis, CKD stage III, TIA came to the hospital with complains of shortness of breath and cough.  Upon admission she was diagnosed with COPD exacerbation.  Patient reported that she had some sore throat and possibly contact with someone with strep throat therefore was checked which was negative.  Her respiratory panel was negative for influenza but did show parainfluenza positive.  Upon admission she was started on IV Solu-Medrol and scheduled bronchodilators.  Her home bronchodilator including Dulera was continued.  During hospital stay she had acutely decompensated and had to be placed on BiPAP.  Currently she has been doing well off of BiPAP.  Her hospital stay was also complicated by atrial fibrillation with RVR therefore I had increased her Cardizem from 30 mg every 8 hours gradually 120 mg every 8 hours, Cardizem drip was weaned off.  05/19/2018: Patient seen and examined at her bedside.  She has no new complaints.   Assessment/Plan: Principal Problem:   COPD exacerbation (HCC) Active Problems:   Essential hypertension   TIA (transient ischemic attack)   Atrial fibrillation with RVR (HCC)   Type II diabetes mellitus with renal manifestations (HCC)   GERD (gastroesophageal reflux disease)   CKD (chronic kidney disease), stage III (HCC)   Acute on chronic respiratory failure with hypoxia (HCC)   Normocytic anemia  Acute hypoxic respiratory failure most likely multifactorial secondary to para influenza versus other Respiratory panel by PCR positive for parainfluenza virus 3 on 05/13/2018 Continue neb treatments Continue IV Solu-Medrol Continue Dulera Continue O2 supplementation to maintain O2 saturation 92% or  greater Home O2 evaluation prior to discharge  Acute exacerbation of COPD secondary to prior influenza infection Management as stated above Blood cultures negative to date  A. fib with RVR, improved Rate controlled Continue Cardizem Continue Eliquis for CVA prophylaxis  AKI on CKD 3 Creatinine 1.13 from 1.57 on 05/16/2018 GFR 46 Avoid nephrotoxic agents/hypotension/dehydration  Hyponatremia Sodium 133 Improving  GERD Continue Protonix  Physical debility/ambulatory dysfunction PT recommends SNF or CIR Patient is adamant about going home instead Fall precautions  Hyperglycemia most likely steroid-induced Insulin sliding scale in place Avoid hypoglycemia Last A1c from 11/2017 revealed A1c of 6.  Code Status: Full code  Family Communication: None at bedside  Disposition Plan: Home/SNF when hemodynamically stable   Consultants:  None  Procedures:  None  Antimicrobials:  None  DVT prophylaxis: Eliquis   Objective: Vitals:   05/19/18 0731 05/19/18 0900 05/19/18 1039 05/19/18 1201  BP: (!) 133/50  (!) 133/50 (!) 117/53  Pulse: 72  90 79  Resp: 18   15  Temp: 98.4 F (36.9 C)   97.7 F (36.5 C)  TempSrc: Oral   Oral  SpO2: 99% 100%  100%  Weight:      Height:        Intake/Output Summary (Last 24 hours) at 05/19/2018 1344 Last data filed at 05/19/2018 1039 Gross per 24 hour  Intake 200 ml  Output 800 ml  Net -600 ml   Filed Weights   05/13/18 0925  Weight: 63.9 kg (140 lb 14 oz)    Exam:  . General: 77 y.o. year-old female well developed well nourished in no acute distress.  Alert and oriented x3. . Cardiovascular: Regular rate and rhythm with no rubs  or gallops.  No thyromegaly or JVD noted.   Marland Kitchen Respiratory: Clear to auscultation with no wheezes or rales. Good inspiratory effort. . Abdomen: Soft nontender nondistended with normal bowel sounds x4 quadrants. . Musculoskeletal: No lower extremity edema. 2/4 pulses in all 4  extremities. . Skin: No ulcerative lesions noted or rashes, . Psychiatry: Mood is appropriate for condition and setting   Data Reviewed: CBC: Recent Labs  Lab 05/15/18 0334 05/16/18 0603 05/17/18 0407 05/18/18 0215 05/19/18 0232  WBC 24.5* 15.3* 11.8* 14.0* 24.2*  HGB 9.2* 9.2* 9.0* 9.0* 9.6*  HCT 30.3* 29.7* 28.1* 28.4* 30.9*  MCV 86.3 83.4 81.4 82.3 82.8  PLT 347 344 307 302 319   Basic Metabolic Panel: Recent Labs  Lab 05/15/18 0334 05/16/18 0603 05/17/18 0407 05/18/18 0215 05/19/18 0232  NA 137 131* 129* 132* 133*  K 4.4 4.4 4.1 4.4 4.3  CL 93* 87* 85* 87* 89*  CO2 32 30 31 34* 36*  GLUCOSE 141* 183* 112* 140* 147*  BUN 32* 53* 59* 53* 46*  CREATININE 1.40* 1.57* 1.52* 1.36* 1.13*  CALCIUM 9.5 9.4 9.4 9.4 9.4  MG 2.0 2.1 2.5* 2.4 2.3   GFR: Estimated Creatinine Clearance: 36 mL/min (A) (by C-G formula based on SCr of 1.13 mg/dL (H)). Liver Function Tests: Recent Labs  Lab 05/12/18 2233  AST 35  ALT 20  ALKPHOS 65  BILITOT 0.4  PROT 7.1  ALBUMIN 3.8   No results for input(s): LIPASE, AMYLASE in the last 168 hours. No results for input(s): AMMONIA in the last 168 hours. Coagulation Profile: No results for input(s): INR, PROTIME in the last 168 hours. Cardiac Enzymes: No results for input(s): CKTOTAL, CKMB, CKMBINDEX, TROPONINI in the last 168 hours. BNP (last 3 results) No results for input(s): PROBNP in the last 8760 hours. HbA1C: No results for input(s): HGBA1C in the last 72 hours. CBG: Recent Labs  Lab 05/18/18 1214 05/18/18 1639 05/18/18 2107 05/19/18 0729 05/19/18 1159  GLUCAP 242* 181* 181* 171* 189*   Lipid Profile: No results for input(s): CHOL, HDL, LDLCALC, TRIG, CHOLHDL, LDLDIRECT in the last 72 hours. Thyroid Function Tests: No results for input(s): TSH, T4TOTAL, FREET4, T3FREE, THYROIDAB in the last 72 hours. Anemia Panel: No results for input(s): VITAMINB12, FOLATE, FERRITIN, TIBC, IRON, RETICCTPCT in the last 72  hours. Urine analysis:    Component Value Date/Time   COLORURINE STRAW (A) 05/12/2018 2344   APPEARANCEUR CLEAR 05/12/2018 2344   LABSPEC 1.006 05/12/2018 2344   PHURINE 6.0 05/12/2018 2344   GLUCOSEU 150 (A) 05/12/2018 2344   HGBUR NEGATIVE 05/12/2018 2344   HGBUR negative 10/11/2008 0926   BILIRUBINUR NEGATIVE 05/12/2018 2344   BILIRUBINUR Neg 05/12/2015 1234   KETONESUR NEGATIVE 05/12/2018 2344   PROTEINUR NEGATIVE 05/12/2018 2344   UROBILINOGEN 0.2 05/12/2015 1234   UROBILINOGEN 1.0 08/09/2012 1727   NITRITE NEGATIVE 05/12/2018 2344   LEUKOCYTESUR NEGATIVE 05/12/2018 2344   Sepsis Labs: (procalcitonin:4,lacticidven:4)  ) Recent Results (from the past 240 hour(s))  Respiratory Panel by PCR     Status: Abnormal   Collection Time: 05/13/18 12:46 AM  Result Value Ref Range Status   Adenovirus NOT DETECTED NOT DETECTED Final   Coronavirus 229E NOT DETECTED NOT DETECTED Final   Coronavirus HKU1 NOT DETECTED NOT DETECTED Final   Coronavirus NL63 NOT DETECTED NOT DETECTED Final   Coronavirus OC43 NOT DETECTED NOT DETECTED Final   Metapneumovirus NOT DETECTED NOT DETECTED Final   Rhinovirus / Enterovirus NOT DETECTED NOT DETECTED Final  Influenza A NOT DETECTED NOT DETECTED Final   Influenza B NOT DETECTED NOT DETECTED Final   Parainfluenza Virus 1 NOT DETECTED NOT DETECTED Final   Parainfluenza Virus 2 NOT DETECTED NOT DETECTED Final   Parainfluenza Virus 3 DETECTED (A) NOT DETECTED Final   Parainfluenza Virus 4 NOT DETECTED NOT DETECTED Final   Respiratory Syncytial Virus NOT DETECTED NOT DETECTED Final   Bordetella pertussis NOT DETECTED NOT DETECTED Final   Chlamydophila pneumoniae NOT DETECTED NOT DETECTED Final   Mycoplasma pneumoniae NOT DETECTED NOT DETECTED Final    Comment: Performed at [facility], [address] [place], [place] [address]  Group A Strep by PCR     Status: None   Collection Time: 05/13/18 12:46 AM  Result Value Ref Range Status    Group A Strep by PCR NOT DETECTED NOT DETECTED Final    Comment: Performed at [facility], [address] [place], [place] [address]  Culture, blood (routine x 2) Call MD if unable to obtain prior to antibiotics being given     Status: None   Collection Time: 05/13/18  2:03 AM  Result Value Ref Range Status   Specimen Description BLOOD LEFT ANTECUBITAL  Final   Special Requests   Final    BOTTLES DRAWN AEROBIC AND ANAEROBIC Blood Culture adequate volume   Culture   Final    NO GROWTH 5 DAYS Performed at [facility], [address] [place], [place] [address]    Report Status 05/18/2018 FINAL  Final  Culture, blood (routine x 2) Call MD if unable to obtain prior to antibiotics being given     Status: None   Collection Time: 05/13/18  2:12 AM  Result Value Ref Range Status   Specimen Description BLOOD LEFT FOREARM  Final   Special Requests   Final    BOTTLES DRAWN AEROBIC AND ANAEROBIC Blood Culture adequate volume   Culture   Final    NO GROWTH 5 DAYS Performed at [facility], [address] [place], [place] [address]    Report Status 05/18/2018 FINAL  Final  Urine culture     Status: Abnormal   Collection Time: 05/13/18  3:57 AM  Result Value Ref Range Status   Specimen Description URINE, CLEAN CATCH  Final   Special Requests   Final    NONE Performed at [facility], [address], [place], [place] [address]    Culture 20,000 COLONIES/mL STAPHYLOCOCCUS HAEMOLYTICUS (A)  Final   Report Status 05/15/2018 FINAL  Final   Organism ID, Bacteria STAPHYLOCOCCUS HAEMOLYTICUS (A)  Final      Susceptibility   Staphylococcus haemolyticus - MIC*    CIPROFLOXACIN >=8 RESISTANT Resistant     GENTAMICIN <=0.5 SENSITIVE Sensitive     NITROFURANTOIN 32 SENSITIVE Sensitive     OXACILLIN >=4 RESISTANT Resistant     TETRACYCLINE >=16 RESISTANT Resistant     VANCOMYCIN <=0.5 SENSITIVE Sensitive     TRIMETH/SULFA <=10 SENSITIVE Sensitive     CLINDAMYCIN >=8  RESISTANT Resistant     RIFAMPIN <=0.5 SENSITIVE Sensitive     Inducible Clindamycin NEGATIVE Sensitive     * 20,000 COLONIES/mL STAPHYLOCOCCUS HAEMOLYTICUS  MRSA PCR Screening     Status: None   Collection Time: 05/13/18  9:06 AM  Result Value Ref Range Status   MRSA by PCR NEGATIVE NEGATIVE Final    Comment:        The GeneXpert MRSA Assay (FDA approved  for NASAL specimens only), is one component of a comprehensive MRSA colonization surveillance program. It is not intended to diagnose MRSA infection nor to guide or monitor treatment for MRSA infections. Performed at [facility], [address] [place], [place] [address]       Studies: No results found.  Scheduled Meds: . apixaban  5 mg Oral BID  . atorvastatin  20 mg Oral Daily  . cholecalciferol  1,000 Units Oral Daily  . diltiazem  120 mg Oral Q8H  . insulin aspart  0-5 Units Subcutaneous QHS  . insulin aspart  0-9 Units Subcutaneous TID WC  . mouth rinse  15 mL Mouth Rinse BID  . methylPREDNISolone (SOLU-MEDROL) injection  40 mg Intravenous BID  . metoprolol succinate  25 mg Oral Daily  . mometasone-formoterol  2 puff Inhalation BID  . pantoprazole  40 mg Oral Daily  . polyethylene glycol  17 g Oral Daily  . senna  2 tablet Oral Daily  . vitamin C  1,000 mg Oral BID    Continuous Infusions:   LOS: 6 days     Darlin Drop, MD [facility] Pager [phone]  If 7PM-7AM, please contact night-coverage [email or web address] Password [facility] 05/19/2018, 1:44 PM

## 2018-05-19 NOTE — Progress Notes (Signed)
Pt resting comfortably during the night.  No BIPAP used last pm. Will cont to follow progress and have BIPAP available if pt needs it.  Machine at bedside.

## 2018-05-19 NOTE — Care Management Note (Addendum)
Case Management Note  Patient Details  Name: Dominique Clark MRN: [id number] Date of Birth: 1941/05/16  Subjective/Objective:  From home with son, her brother is there for support as well, she states there is always someone there with her.  CIR rec SNF for patient and patient refused SNF and wants to go home with [facility] services.  She chose [facility] for [facility], PT, OT, aide and SW, referral given to [facility] with [facility] . Soc will begin in 24-48 hrs post dc.                    Action/Plan: DC home with [facility] services when ready.  Expected Discharge Date:                  Expected Discharge Plan:  Home w Home Health Services  In-House Referral:     Discharge planning Services  CM Consult  Post Acute Care Choice:    Choice offered to:  Patient  DME Arranged:   3 n 1 DME Agency:   [facility]  HH Arranged:  RN, PT, OT, Nurse's Aide, Social Work [employer] Agency:  [facility]  Status of Service:  Completed, signed off  If discussed at [employer], dates discussed:    Additional Comments:  Leone Haven, RN 05/19/2018, 3:21 PM

## 2018-05-19 NOTE — Progress Notes (Signed)
Occupational Therapy Evaluation Patient Details Name: Dominique Clark MRN: [id number] DOB: 10/29/1941 Today's Date: 05/19/2018    History of Present Illness 77 year old female with history of hyperlipidemia, diet-controlled diabetes mellitus type 2, COPD, asthma, GERD, atrial fibrillation on Eliquis, CKD stage III, TIA came to the hospital with complains of shortness of breath and cough.  Upon admission she was diagnosed with COPD exacerbation.    Clinical Impression   PTA, pt states she was modified independent with mobility and ADL on 2L O2. Pt currently requires min A with mobility and ADL @ RW level. Pt desats to 89 briefly on 3L but quickly rebounds. Dyspnea 3/4 with RR 25. Pt appropriate to DC home but will need 24/7 S and HHOT. Pt needs a 3in1 for safe DC home. Pt is able to use her rollator to help get into her house. Will follow acutely.     Follow Up Recommendations  Home health OT;Supervision/Assistance - 24 hour    Equipment Recommendations  3 in 1 bedside commode    Recommendations for Other Services       Precautions / Restrictions Precautions Precautions: Fall;Other (comment)(watch O2 Sats) Restrictions Weight Bearing Restrictions: No      Mobility Bed Mobility               General bed mobility comments: OOB in chair  Transfers Overall transfer level: Needs assistance Equipment used: Rolling walker (2 wheeled) Transfers: Sit to/from Stand Sit to Stand: Min assist              Balance Overall balance assessment: Needs assistance Sitting-balance support: No upper extremity supported;Feet supported Sitting balance-Leahy Scale: Fair     Standing balance support: Bilateral upper extremity supported Standing balance-Leahy Scale: Poor Standing balance comment: posterior bias initially                           ADL either performed or assessed with clinical judgement   ADL Overall ADL's : Needs assistance/impaired     Grooming:  Minimal assistance;Sitting   Upper Body Bathing: Minimal assistance;Sitting   Lower Body Bathing: Minimal assistance;Sit to/from stand   Upper Body Dressing : Minimal assistance;Sitting   Lower Body Dressing: Minimal assistance;Sit to/from stand   Toilet Transfer: Minimal assistance;BSC   Toileting- Clothing Manipulation and Hygiene: Minimal assistance;Sit to/from stand       Functional mobility during ADLs: Minimal assistance;Rolling walker;Cueing for safety General ADL Comments: vc for hand placement; pt initially with uncontrolled descent to chair     Vision         Perception     Praxis      Pertinent Vitals/Pain Pain Assessment: No/denies pain     Hand Dominance Right   Extremity/Trunk Assessment Upper Extremity Assessment Upper Extremity Assessment: Generalized weakness;RUE deficits/detail RUE Deficits / Details: Apparent RTC insufficiency; limited use R shoudler   Lower Extremity Assessment Lower Extremity Assessment: Generalized weakness   Cervical / Trunk Assessment Cervical / Trunk Assessment: Kyphotic   Communication Communication Communication: No difficulties   Cognition Arousal/Alertness: Awake/alert Behavior During Therapy: WFL for tasks assessed/performed Overall Cognitive Status: Within Functional Limits for tasks assessed                                     General Comments       Exercises Exercises: Other exercises Other Exercises Other Exercises: encouraged use of IS Other  Exercises: Marching in seated position x 15   Shoulder Instructions      Home Living Family/patient expects to be discharged to:: Private residence Living Arrangements: Alone Available Help at Discharge: Family(brother and son help out ) Type of Home: Mobile home Home Access: Ramped entrance     Home Layout: One level     Bathroom Shower/Tub: Chief Strategy Officer: Handicapped height Bathroom Accessibility: Yes How  Accessible: Accessible via walker Home Equipment: Grab bars - toilet;Grab bars - tub/shower;Walker - 2 wheels;Cane - single point;Shower seat;Walker - 4 wheels          Prior Functioning/Environment Level of Independence: Independent with assistive device(s)        Comments: sometimes uses RW for public ambulation        OT Problem List: Decreased strength;Decreased activity tolerance;Impaired balance (sitting and/or standing);Decreased safety awareness;Decreased knowledge of use of DME or AE;Cardiopulmonary status limiting activity;Impaired UE functional use      OT Treatment/Interventions: Self-care/ADL training;Therapeutic exercise;Energy conservation;DME and/or AE instruction;Therapeutic activities;Patient/family education;Balance training    OT Goals(Current goals can be found in the care plan section) Acute Rehab OT Goals Patient Stated Goal: go home OT Goal Formulation: With patient Time For Goal Achievement: 06/02/18 Potential to Achieve Goals: Good  OT Frequency: Min 2X/week   Barriers to D/C:            Co-evaluation              AM-PAC PT "6 Clicks" Daily Activity     Outcome Measure Help from another person eating meals?: None Help from another person taking care of personal grooming?: A [place] Help from another person toileting, which includes using toliet, bedpan, or urinal?: A [place] Help from another person bathing (including washing, rinsing, drying)?: A Balash Help from another person to put on and taking off regular upper body clothing?: A [place] Help from another person to put on and taking off regular lower body clothing?: A Aylward 6 Click Score: 19   End of Session Equipment Utilized During Treatment: Rolling walker;Oxygen Nurse Communication: Mobility status;Other (comment)(respiratory status; need for 3in1)  Activity Tolerance: Patient tolerated treatment well Patient left: in chair;with call bell/phone within reach  OT Visit Diagnosis:  Unsteadiness on feet (R26.81);Muscle weakness (generalized) (M62.81)                Time: 1610-9604 OT Time Calculation (min): 20 min Charges:  OT General Charges $OT Visit: 1 Visit OT Evaluation $OT Eval Moderate Complexity: 1 Mod G-Codes:     Luisa Dago, OT/L  OT Clinical Specialist [phone]   [facility] 05/19/2018, 4:37 PM

## 2018-05-20 LAB — GLUCOSE, CAPILLARY
GLUCOSE-CAPILLARY: 157 mg/dL — AB (ref 65–99)
GLUCOSE-CAPILLARY: 177 mg/dL — AB (ref 65–99)
Glucose-Capillary: 139 mg/dL — ABNORMAL HIGH (ref 65–99)

## 2018-05-20 MED ORDER — PREDNISONE 20 MG PO TABS: 40.0000 mg | ORAL_TABLET | Freq: Every day | ORAL | 0 refills | Status: DC

## 2018-05-20 MED ORDER — PREDNISONE 20 MG PO TABS
40.0000 mg | ORAL_TABLET | Freq: Every day | ORAL | Status: DC
  Administered 2018-05-20: 40 mg via ORAL
  Filled 2018-05-20: qty 2

## 2018-05-20 NOTE — Discharge Summary (Signed)
Discharge Summary  Dominique Clark ZOX:[id number] DOB: [birth year]  PCP: [name], MD  Admit date: 05/12/2018 Discharge date: 05/20/2018  Time spent: 25 minutes  Recommendations for Outpatient Follow-up:  1. Follow-up with PCP 2. Take medications as prescribed 3. Fall precautions 4. Continue PT at home  Discharge Diagnoses:  Active Hospital Problems   Diagnosis Date Noted  . COPD exacerbation (HCC) 05/13/2018  . Type II diabetes mellitus with renal manifestations (HCC) 05/13/2018  . GERD (gastroesophageal reflux disease) 05/13/2018  . CKD (chronic kidney disease), stage III (HCC) 05/13/2018  . Acute on chronic respiratory failure with hypoxia (HCC) 05/13/2018  . Normocytic anemia 05/13/2018  . Atrial fibrillation with RVR (HCC) 11/27/2017  . TIA (transient ischemic attack) 11/27/2017  . Essential hypertension 10/16/2016    Resolved Hospital Problems  No resolved problems to display.    Discharge Condition: Stable  Diet recommendation: Resume previous diet  Vitals:   05/20/18 1034 05/20/18 1235  BP: (!) 138/48 (!) 133/56  Pulse: 99 87  Resp:  18  Temp:    SpO2:  100%    History of present illness:  77 year old female with history of hyperlipidemia, diet-controlled diabetes mellitus type 2, COPD, asthma, GERD, atrial fibrillation on Eliquis, CKD stage III, TIA came to the hospital with complains of shortness of breath and cough. Upon admission she was diagnosed with COPD exacerbation. Patient reported that she had some sore throat and possibly contact with someone with strep throat therefore was checked which was negative. Her respiratory panel was negative for influenza but did show parainfluenza positive. Upon admission she was started on IV Solu-Medrol and scheduled bronchodilators. Her home bronchodilator including Dulera was continued. During hospital stay she had acutely decompensated and had to be placed on BiPAP. Currently she has been doing well off of  BiPAP. Her hospital stay was also complicated by atrial fibrillation with RVR therefore I had increased her Cardizem from 30 mg every 8 hours gradually 120 mg every 8 hours, Cardizem drip was weaned off.  05/19/2018: Patient seen and examined at bedside.  She has no new complaints.  She is adamant about going home.  She has declined short-term rehab.  The day of discharge patient was hemodynamically stable.  She will need to continue PT at home and to follow-up with her primary care provider post hospitalization.  Hospital Course:  Principal Problem:   COPD exacerbation (HCC) Active Problems:   Essential hypertension   TIA (transient ischemic attack)   Atrial fibrillation with RVR (HCC)   Type II diabetes mellitus with renal manifestations (HCC)   GERD (gastroesophageal reflux disease)   CKD (chronic kidney disease), stage III (HCC)   Acute on chronic respiratory failure with hypoxia (HCC)   Normocytic anemia  Acute hypoxic respiratory failure most likely multifactorial secondary to para influenza versus other Respiratory panel by PCR positive for parainfluenza virus 3 on 05/13/2018 Continue neb treatments Completed IV Solu-Medrol Continue Dulera Continue O2 supplementation to maintain O2 saturation 92% or greater Home O2 evaluation prior to discharge  Acute exacerbation of COPD secondary to prior influenza infection Management as stated above Blood cultures negative to date  A. fib with RVR, improved Rate controlled Continue Cardizem Continue Eliquis for CVA prophylaxis  AKI on CKD 3 Creatinine 1.13 from 1.57 on 05/16/2018 GFR 46 Avoid nephrotoxic agents/hypotension/dehydration  Hyponatremia Sodium 133 Improving  GERD Continue Protonix  Physical debility/ambulatory dysfunction PT recommends SNF or CIR Patient is adamant about going home instead Fall precautions  Hyperglycemia most likely  steroid-induced Insulin sliding scale in place Avoid  hypoglycemia Last A1c from 11/2017 revealed A1c of 6.   Procedures:  None  Consultations:  None  Discharge Exam: BP (!) 133/56 (BP Location: Left Arm)   Pulse 87   Temp 97.9 F (36.6 C) (Oral)   Resp 18   Ht 5\' 4"  (1.626 m)   Wt 63.9 kg (140 lb 14 oz)   SpO2 100%   BMI 24.18 kg/m  . General: 77 y.o. year-old female well developed well nourished in no acute distress.  Alert and oriented x3. . Cardiovascular: Regular rate and rhythm with no rubs or gallops.  No thyromegaly or JVD noted.   Marland Kitchen Respiratory: Clear to auscultation with no wheezes or rales. Good inspiratory effort. . Abdomen: Soft nontender nondistended with normal bowel sounds x4 quadrants. . Musculoskeletal: No lower extremity edema. 2/4 pulses in all 4 extremities. . Skin: No ulcerative lesions noted or rashes, . Psychiatry: Mood is appropriate for condition and setting  Discharge Instructions You were cared for by a hospitalist during your hospital stay. If you have any questions about your discharge medications or the care you received while you were in the hospital after you are discharged, you can call the unit and asked to speak with the hospitalist on call if the hospitalist that took care of you is not available. Once you are discharged, your primary care physician will handle any further medical issues. Please note that NO REFILLS for any discharge medications will be authorized once you are discharged, as it is imperative that you return to your primary care physician (or establish a relationship with a primary care physician if you do not have one) for your aftercare needs so that they can reassess your need for medications and monitor your lab values.  Discharge Instructions    AMB Referral to [facility] Care Management   Complete by:  As directed    Reason for consult:  COPD EMMI   Diagnoses of:  COPD/ Pneumonia   Expected date of contact:  1-3 days (reserved for hospital discharges)     Allergies as of  05/20/2018      Reactions   Doxycycline Nausea And Vomiting      Medication List    STOP taking these medications   azithromycin 250 MG tablet Commonly known as:  ZITHROMAX   meloxicam 15 MG tablet Commonly known as:  MOBIC   predniSONE 10 MG (21) Tbpk tablet Commonly known as:  STERAPRED UNI-PAK 21 TAB Replaced by:  predniSONE 20 MG tablet   traMADol 50 MG tablet Commonly known as:  ULTRAM     TAKE these medications   albuterol 108 (90 Base) MCG/ACT inhaler Commonly known as:  PROVENTIL HFA;VENTOLIN HFA Inhale 1-2 puffs into the lungs every 4 (four) hours as needed for wheezing. Okay to fill with ventolin or proventil if needed.   apixaban 5 MG Tabs tablet Commonly known as:  ELIQUIS Take 1 tablet (5 mg total) by mouth 2 (two) times daily.   atorvastatin 20 MG tablet Commonly known as:  LIPITOR Take 1 tablet (20 mg total) by mouth daily.   budesonide-formoterol 160-4.5 MCG/ACT inhaler Commonly known as:  SYMBICORT INHALE 2 PUFFS EVERY 12 HOURS TO PREVENTCOUGH OR WHEEZING--RINSE, GARGLE, & SPITAFTER EACH USE. What changed:    how much to take  how to take this  when to take this  additional instructions   cholecalciferol 1000 units tablet Commonly known as:  VITAMIN D Take 1,000 Units by mouth  daily.   diltiazem 30 MG tablet Commonly known as:  CARDIZEM Take 1 tablet (30 mg total) by mouth 3 (three) times daily as needed.   esomeprazole 20 MG capsule Commonly known as:  NEXIUM Take 20 mg by mouth daily at 12 noon.   fluticasone 50 MCG/ACT nasal spray Commonly known as:  FLONASE Place 2 sprays into both nostrils daily as needed (seasonal allergies).   ipratropium-albuterol 0.5-2.5 (3) MG/3ML Soln Commonly known as:  DUONEB Take 3 mLs by nebulization every 4 (four) hours as needed. Dx:J44.9   metoprolol succinate 25 MG 24 hr tablet Commonly known as:  TOPROL-XL Take 1 tablet (25 mg total) by mouth daily.   predniSONE 20 MG tablet Commonly known  as:  DELTASONE Take 2 tablets (40 mg total) by mouth daily with breakfast. 40 MG DAILY 05/21/18 30 MG DAILY 05/22/18 20 MG DAILY 05/23/18 10 MG DAILY 05/24/18 Start taking on:  05/21/2018 Replaces:  predniSONE 10 MG (21) Tbpk tablet   vitamin C 500 MG tablet Commonly known as:  ASCORBIC ACID Take 1,000 mg by mouth 2 (two) times daily.            Durable Medical Equipment  (From admission, onward)        Start     Ordered   05/19/18 1759  For home use only DME Bedside commode  Once    Question:  Patient needs a bedside commode to treat with the following condition  Answer:  Weakness   05/19/18 1758     Allergies  Allergen Reactions  . Doxycycline Nausea And Vomiting   Follow-up Information    [name], MD. Call in 1 day(s).   Specialty:  Family Medicine Why:  PLEASE CALL TO MAKE APPOINTMENT. Contact information: [address] [place] [address] [phone]            The results of significant diagnostics from this hospitalization (including imaging, microbiology, ancillary and laboratory) are listed below for reference.    Significant Diagnostic Studies: Dg Chest Port 1 View  Result Date: 05/15/2018 CLINICAL DATA:  Shortness of breath for the past 2 days. EXAM: PORTABLE CHEST 1 VIEW COMPARISON:  Chest x-ray dated May 12, 2018. FINDINGS: Stable borderline cardiomegaly. Normal pulmonary vascularity. The lungs remain hyperinflated with emphysematous changes. Coarsened interstitial markings are unchanged. No focal consolidation, pleural effusion, or pneumothorax. No acute osseous abnormality. Unchanged moderate hiatal hernia. IMPRESSION: COPD.  No active disease. Electronically Signed   By: Obie Dredge M.D.   On: 05/15/2018 08:33   Dg Chest Portable 1 View  Result Date: 05/12/2018 CLINICAL DATA:  Short of breath EXAM: PORTABLE CHEST 1 VIEW COMPARISON:  11/27/2017 FINDINGS: Hyperinflation with mild diffuse coarse chronic interstitial opacity. No  acute airspace disease or effusion. Stable borderline enlarged cardiomediastinal silhouette with aortic atherosclerosis. No pneumothorax. Moderate hiatal hernia. IMPRESSION: Hyperinflation without acute pulmonary opacity. Moderate hiatal hernia Electronically Signed   By: Jasmine Pang M.D.   On: 05/12/2018 23:50    Microbiology: Recent Results (from the past 240 hour(s))  Respiratory Panel by PCR     Status: Abnormal   Collection Time: 05/13/18 12:46 AM  Result Value Ref Range Status   Adenovirus NOT DETECTED NOT DETECTED Final   Coronavirus 229E NOT DETECTED NOT DETECTED Final   Coronavirus HKU1 NOT DETECTED NOT DETECTED Final   Coronavirus NL63 NOT DETECTED NOT DETECTED Final   Coronavirus OC43 NOT DETECTED NOT DETECTED Final   Metapneumovirus NOT DETECTED NOT DETECTED Final  Rhinovirus / Enterovirus NOT DETECTED NOT DETECTED Final   Influenza A NOT DETECTED NOT DETECTED Final   Influenza B NOT DETECTED NOT DETECTED Final   Parainfluenza Virus 1 NOT DETECTED NOT DETECTED Final   Parainfluenza Virus 2 NOT DETECTED NOT DETECTED Final   Parainfluenza Virus 3 DETECTED (A) NOT DETECTED Final   Parainfluenza Virus 4 NOT DETECTED NOT DETECTED Final   Respiratory Syncytial Virus NOT DETECTED NOT DETECTED Final   Bordetella pertussis NOT DETECTED NOT DETECTED Final   Chlamydophila pneumoniae NOT DETECTED NOT DETECTED Final   Mycoplasma pneumoniae NOT DETECTED NOT DETECTED Final    Comment: Performed at [facility], [address] [place], [place] [address]  Group A Strep by PCR     Status: None   Collection Time: 05/13/18 12:46 AM  Result Value Ref Range Status   Group A Strep by PCR NOT DETECTED NOT DETECTED Final    Comment: Performed at [facility], [address], [place], [place] [address]  Culture, blood (routine x 2) Call MD if unable to obtain prior to antibiotics being given     Status: None   Collection Time: 05/13/18  2:03 AM  Result Value Ref Range Status    Specimen Description BLOOD LEFT ANTECUBITAL  Final   Special Requests   Final    BOTTLES DRAWN AEROBIC AND ANAEROBIC Blood Culture adequate volume   Culture   Final    NO GROWTH 5 DAYS Performed at [facility], [address], [place], [place] [address]    Report Status 05/18/2018 FINAL  Final  Culture, blood (routine x 2) Call MD if unable to obtain prior to antibiotics being given     Status: None   Collection Time: 05/13/18  2:12 AM  Result Value Ref Range Status   Specimen Description BLOOD LEFT FOREARM  Final   Special Requests   Final    BOTTLES DRAWN AEROBIC AND ANAEROBIC Blood Culture adequate volume   Culture   Final    NO GROWTH 5 DAYS Performed at [facility], [address], [place], [place] [address]    Report Status 05/18/2018 FINAL  Final  Urine culture     Status: Abnormal   Collection Time: 05/13/18  3:57 AM  Result Value Ref Range Status   Specimen Description URINE, CLEAN CATCH  Final   Special Requests   Final    NONE Performed at [facility], [address], [place], [place] [address]    Culture 20,000 COLONIES/mL STAPHYLOCOCCUS HAEMOLYTICUS (A)  Final   Report Status 05/15/2018 FINAL  Final   Organism ID, Bacteria STAPHYLOCOCCUS HAEMOLYTICUS (A)  Final      Susceptibility   Staphylococcus haemolyticus - MIC*    CIPROFLOXACIN >=8 RESISTANT Resistant     GENTAMICIN <=0.5 SENSITIVE Sensitive     NITROFURANTOIN 32 SENSITIVE Sensitive     OXACILLIN >=4 RESISTANT Resistant     TETRACYCLINE >=16 RESISTANT Resistant     VANCOMYCIN <=0.5 SENSITIVE Sensitive     TRIMETH/SULFA <=10 SENSITIVE Sensitive     CLINDAMYCIN >=8 RESISTANT Resistant     RIFAMPIN <=0.5 SENSITIVE Sensitive     Inducible Clindamycin NEGATIVE Sensitive     * 20,000 COLONIES/mL STAPHYLOCOCCUS HAEMOLYTICUS  MRSA PCR Screening     Status: None   Collection Time: 05/13/18  9:06 AM  Result Value Ref Range Status   MRSA by PCR NEGATIVE NEGATIVE Final    Comment:  The  GeneXpert MRSA Assay (FDA approved for NASAL specimens only), is one component of a comprehensive MRSA colonization surveillance program. It is not intended to diagnose MRSA infection nor to guide or monitor treatment for MRSA infections. Performed at [facility], [address] [place], [place] [address]      Labs: Basic Metabolic Panel: Recent Labs  Lab 05/15/18 0334 05/16/18 0603 05/17/18 0407 05/18/18 0215 05/19/18 0232  NA 137 131* 129* 132* 133*  K 4.4 4.4 4.1 4.4 4.3  CL 93* 87* 85* 87* 89*  CO2 32 30 31 34* 36*  GLUCOSE 141* 183* 112* 140* 147*  BUN 32* 53* 59* 53* 46*  CREATININE 1.40* 1.57* 1.52* 1.36* 1.13*  CALCIUM 9.5 9.4 9.4 9.4 9.4  MG 2.0 2.1 2.5* 2.4 2.3   Liver Function Tests: No results for input(s): AST, ALT, ALKPHOS, BILITOT, PROT, ALBUMIN in the last 168 hours. No results for input(s): LIPASE, AMYLASE in the last 168 hours. No results for input(s): AMMONIA in the last 168 hours. CBC: Recent Labs  Lab 05/15/18 0334 05/16/18 0603 05/17/18 0407 05/18/18 0215 05/19/18 0232  WBC 24.5* 15.3* 11.8* 14.0* 24.2*  HGB 9.2* 9.2* 9.0* 9.0* 9.6*  HCT 30.3* 29.7* 28.1* 28.4* 30.9*  MCV 86.3 83.4 81.4 82.3 82.8  PLT 347 344 307 302 319   Cardiac Enzymes: No results for input(s): CKTOTAL, CKMB, CKMBINDEX, TROPONINI in the last 168 hours. BNP: BNP (last 3 results) Recent Labs    11/27/17 1645 05/13/18 0211 05/15/18 0334  BNP 214.4* 172.2* 225.9*    ProBNP (last 3 results) No results for input(s): PROBNP in the last 8760 hours.  CBG: Recent Labs  Lab 05/19/18 1159 05/19/18 1615 05/19/18 2127 05/20/18 0728 05/20/18 1233  GLUCAP 189* 290* 115* 139* 157*       Signed:  Darlin Drop, MD [facility] 05/20/2018, 1:57 PM

## 2018-05-20 NOTE — Care Management Note (Signed)
Case Management Note  Patient Details  Name: Dominique Clark MRN: [id number] Date of Birth: 07-Oct-1941  Subjective/Objective:  For discharge today, Lupita Leash with [facility] notified, and Fayrene Fearing for 3 n 1 notified.                   Action/Plan: DC home with [facility] services when 3 n 1 delivered to room.  Expected Discharge Date:  05/20/18               Expected Discharge Plan:  Home w Home Health Services  In-House Referral:     Discharge planning Services  CM Consult  Post Acute Care Choice:    Choice offered to:  Patient  DME Arranged:  3-N-1 DME Agency:  [facility]  HH Arranged:  RN, PT, OT, Nurse's Aide, Social Work [employer] Agency:  [facility]  Status of Service:  Completed, signed off  If discussed at [employer], dates discussed:    Additional Comments:  Leone Haven, RN 05/20/2018, 2:04 PM

## 2018-05-20 NOTE — Progress Notes (Signed)
Physical Therapy Treatment Patient Details Name: Dominique Clark MRN: [id number] DOB: 04-04-1941 Today's Date: 05/20/2018    History of Present Illness 77 year old female with history of hyperlipidemia, diet-controlled diabetes mellitus type 2, COPD, asthma, GERD, atrial fibrillation on Eliquis, CKD stage III, TIA came to the hospital with complains of shortness of breath and cough.  Upon admission she was diagnosed with COPD exacerbation.     PT Comments    Pt is making slow progress towards her goals, however pt is limited in her safe mobility by decreased strength and endurance. Pt found to be incontinent of urine on entry. Pt required min guard for bed mobility, minA for powerup to standing for pericare. Pt able to stand for 2x 3 minutes with alternating between B UE support to no support. Pt fatigued with standing and requested to transfer to recline. D/c plan should be changed to SNF level rehab to provide correct level of rehab before returning to her home environment.  PT will continue to follow acutely until d/c.     Follow Up Recommendations  SNF     Equipment Recommendations  None recommended by PT    Recommendations for Other Services OT consult     Precautions / Restrictions Precautions Precautions: None Restrictions Weight Bearing Restrictions: No    Mobility  Bed Mobility Overal bed mobility: Needs Assistance Bed Mobility: Supine to Sit     Supine to sit: Min guard     General bed mobility comments: min guard for safety, requires increased time and vc for scooting to EoB  Transfers Overall transfer level: Needs assistance Equipment used: Rolling walker (2 wheeled) Transfers: Sit to/from Stand Sit to Stand: Min assist         General transfer comment: min A for power up and steadying in RW, able to steady in standing for 3 minutes for performance of pericare   Ambulation/Gait Ambulation/Gait assistance: Min assist Ambulation Distance (Feet): 3  Feet Assistive device: Rolling walker (2 wheeled) Gait Pattern/deviations: Step-through pattern;Decreased step length - right;Decreased step length - left;Shuffle;Trunk flexed Gait velocity: slowed Gait velocity interpretation: <1.8 ft/sec, indicate of risk for recurrent falls General Gait Details: minA for mild instability with step transfer to recliner, vc for upright posture and proximity to RW       Balance Overall balance assessment: Needs assistance Sitting-balance support: No upper extremity supported;Feet supported Sitting balance-Leahy Scale: Fair     Standing balance support: Bilateral upper extremity supported;No upper extremity supported;During functional activity Standing balance-Leahy Scale: Fair Standing balance comment: able to stand for pericare in RW with majority of time utilizing B UE support however able to maintain balance for short duration without UE support                            Cognition Arousal/Alertness: Awake/alert Behavior During Therapy: Flat affect Overall Cognitive Status: Within Functional Limits for tasks assessed                                           General Comments General comments (skin integrity, edema, etc.): VSS       Pertinent Vitals/Pain Pain Assessment: No/denies pain           PT Goals (current goals can now be found in the care plan section) Acute Rehab PT Goals Patient Stated Goal: go home PT Goal  Formulation: With patient Time For Goal Achievement: 06/01/18 Potential to Achieve Goals: Fair Progress towards PT goals: Progressing toward goals    Frequency    Min 2X/week      PT Plan Discharge plan needs to be updated       AM-PAC PT "6 Clicks" Daily Activity  Outcome Measure  Difficulty turning over in bed (including adjusting bedclothes, sheets and blankets)?: A Pagnotta Difficulty moving from lying on back to sitting on the side of the bed? : Unable Difficulty sitting  down on and standing up from a chair with arms (e.g., wheelchair, bedside commode, etc,.)?: Unable Help needed moving to and from a bed to chair (including a wheelchair)?: A Rowser Help needed walking in hospital room?: A Lot Help needed climbing 3-5 steps with a railing? : Total 6 Click Score: 11    End of Session Equipment Utilized During Treatment: Gait belt;Oxygen Activity Tolerance: Patient limited by fatigue Patient left: in chair;with call bell/phone within reach;with nursing/sitter in room Nurse Communication: Mobility status PT Visit Diagnosis: Unsteadiness on feet (R26.81);Other abnormalities of gait and mobility (R26.89);Muscle weakness (generalized) (M62.81);Difficulty in walking, not elsewhere classified (R26.2)     Time: 4098-1191 PT Time Calculation (min) (ACUTE ONLY): 23 min  Charges:  $Therapeutic Activity: 23-37 mins                    G Codes:       Lyn Joens B. Beverely Risen PT, DPT Acute Rehabilitation  [phone] Pager [phone]     Elon Alas Fleet 05/20/2018, 2:12 PM

## 2018-05-22 DIAGNOSIS — J441 Chronic obstructive pulmonary disease with (acute) exacerbation: Secondary | ICD-10-CM | POA: Diagnosis not present

## 2018-05-22 DIAGNOSIS — J9621 Acute and chronic respiratory failure with hypoxia: Secondary | ICD-10-CM | POA: Diagnosis not present

## 2018-05-22 DIAGNOSIS — K219 Gastro-esophageal reflux disease without esophagitis: Secondary | ICD-10-CM | POA: Diagnosis not present

## 2018-05-22 DIAGNOSIS — I4891 Unspecified atrial fibrillation: Secondary | ICD-10-CM | POA: Diagnosis not present

## 2018-05-22 DIAGNOSIS — D649 Anemia, unspecified: Secondary | ICD-10-CM | POA: Diagnosis not present

## 2018-05-22 DIAGNOSIS — E1122 Type 2 diabetes mellitus with diabetic chronic kidney disease: Secondary | ICD-10-CM | POA: Diagnosis not present

## 2018-05-22 DIAGNOSIS — I129 Hypertensive chronic kidney disease with stage 1 through stage 4 chronic kidney disease, or unspecified chronic kidney disease: Secondary | ICD-10-CM | POA: Diagnosis not present

## 2018-05-22 DIAGNOSIS — M8580 Other specified disorders of bone density and structure, unspecified site: Secondary | ICD-10-CM | POA: Diagnosis not present

## 2018-05-22 DIAGNOSIS — N183 Chronic kidney disease, stage 3 (moderate): Secondary | ICD-10-CM | POA: Diagnosis not present

## 2018-05-25 ENCOUNTER — Telehealth: Payer: Self-pay | Admitting: *Deleted

## 2018-05-25 ENCOUNTER — Telehealth: Payer: Self-pay

## 2018-05-25 NOTE — Telephone Encounter (Signed)
Lm requesting return call to complete TCM and confirm hosp f/u appt  

## 2018-05-25 NOTE — Telephone Encounter (Signed)
Form signed.  Please fax back then send for scanning. Thanks.

## 2018-05-25 NOTE — Telephone Encounter (Signed)
Copied from CRM [id number]. Topic: Referral - Request >> May 25, 2018 10:37 AM Oneal Grout wrote: Reason for CRM: Requesting referral to [facility] on [address]. Will need referral through [facility] Member ID # [id number]

## 2018-05-25 NOTE — Telephone Encounter (Signed)
Copied from CRM [id number]. Topic: Quick Communication - See Telephone Encounter >> May 25, 2018  3:11 PM Waymon Amato wrote: Dominique Clark called to say that she is needing  verbal orders for skilled nursing 1time  for 2 weeks and then 1time every other week for 7 weeks  Best number for Dominique Clark is [phone]

## 2018-05-25 NOTE — Telephone Encounter (Signed)
Papers faxed to [facility]. Sent to the front for scanning.

## 2018-05-25 NOTE — Telephone Encounter (Signed)
I spoke with Dominique Clark to see what agency she was calling from and it is [facility]. Please see previous call from [name] in this phone note also.

## 2018-05-25 NOTE — Telephone Encounter (Signed)
I spoke with [name]; pt was d/c from hospital and refused to be admitted at a facility at that time; pt wanted home health to do rehab. Now pt wants to be admitted to [facility] rehab. [facility] has not been out to see pt yet. Windy Fast has been out of work for 5 days taking care of pt at home. Windy Fast has spoken with [facility] and pt can be admitted free to [facility] today if can get referral to [facility]. Ronald request cb; pt needs to be admitted no later than 05/26/18 in AM. Pt cannot come in for appt. Pt is so weak she cannot walk; Windy Fast has to "man handle her" to get her on the commode. Ronald  Request cb ASAP.

## 2018-05-26 ENCOUNTER — Telehealth: Payer: Self-pay

## 2018-05-26 DIAGNOSIS — J9621 Acute and chronic respiratory failure with hypoxia: Secondary | ICD-10-CM | POA: Diagnosis not present

## 2018-05-26 DIAGNOSIS — K219 Gastro-esophageal reflux disease without esophagitis: Secondary | ICD-10-CM | POA: Diagnosis not present

## 2018-05-26 DIAGNOSIS — N183 Chronic kidney disease, stage 3 (moderate): Secondary | ICD-10-CM | POA: Diagnosis not present

## 2018-05-26 DIAGNOSIS — E1122 Type 2 diabetes mellitus with diabetic chronic kidney disease: Secondary | ICD-10-CM | POA: Diagnosis not present

## 2018-05-26 DIAGNOSIS — I129 Hypertensive chronic kidney disease with stage 1 through stage 4 chronic kidney disease, or unspecified chronic kidney disease: Secondary | ICD-10-CM | POA: Diagnosis not present

## 2018-05-26 DIAGNOSIS — I4891 Unspecified atrial fibrillation: Secondary | ICD-10-CM | POA: Diagnosis not present

## 2018-05-26 DIAGNOSIS — M8580 Other specified disorders of bone density and structure, unspecified site: Secondary | ICD-10-CM | POA: Diagnosis not present

## 2018-05-26 DIAGNOSIS — D649 Anemia, unspecified: Secondary | ICD-10-CM | POA: Diagnosis not present

## 2018-05-26 DIAGNOSIS — J441 Chronic obstructive pulmonary disease with (acute) exacerbation: Secondary | ICD-10-CM | POA: Diagnosis not present

## 2018-05-26 NOTE — Telephone Encounter (Signed)
Copied from CRM [id number]. Topic: General - Other >> May 26, 2018  2:14 PM Percival Spanish wrote:  Kennyth Arnold a PT with [facility] would like pt for  2 X 4    [phone]

## 2018-05-26 NOTE — Telephone Encounter (Signed)
Verbal order given to [facility] by telephone.

## 2018-05-26 NOTE — Telephone Encounter (Signed)
Verbal given given to Elease Hashimoto as instructed by telephone and verbalized understanding.

## 2018-05-26 NOTE — Telephone Encounter (Signed)
Please give the order.  Thanks.   

## 2018-05-26 NOTE — Telephone Encounter (Signed)
See Rena's note below about the orders (verbal orders for skilled nursing 1 time  for 2 weeks and then 1 time every other week for 7 weeks).  Please give the order.  Thanks.

## 2018-05-27 ENCOUNTER — Telehealth: Payer: Self-pay | Admitting: Family Medicine

## 2018-05-27 DIAGNOSIS — J9621 Acute and chronic respiratory failure with hypoxia: Secondary | ICD-10-CM | POA: Diagnosis not present

## 2018-05-27 DIAGNOSIS — I4891 Unspecified atrial fibrillation: Secondary | ICD-10-CM | POA: Diagnosis not present

## 2018-05-27 DIAGNOSIS — J441 Chronic obstructive pulmonary disease with (acute) exacerbation: Secondary | ICD-10-CM | POA: Diagnosis not present

## 2018-05-27 DIAGNOSIS — E1122 Type 2 diabetes mellitus with diabetic chronic kidney disease: Secondary | ICD-10-CM | POA: Diagnosis not present

## 2018-05-27 DIAGNOSIS — I129 Hypertensive chronic kidney disease with stage 1 through stage 4 chronic kidney disease, or unspecified chronic kidney disease: Secondary | ICD-10-CM | POA: Diagnosis not present

## 2018-05-27 DIAGNOSIS — K219 Gastro-esophageal reflux disease without esophagitis: Secondary | ICD-10-CM | POA: Diagnosis not present

## 2018-05-27 DIAGNOSIS — M8580 Other specified disorders of bone density and structure, unspecified site: Secondary | ICD-10-CM | POA: Diagnosis not present

## 2018-05-27 DIAGNOSIS — D649 Anemia, unspecified: Secondary | ICD-10-CM | POA: Diagnosis not present

## 2018-05-27 DIAGNOSIS — N183 Chronic kidney disease, stage 3 (moderate): Secondary | ICD-10-CM | POA: Diagnosis not present

## 2018-05-27 NOTE — Telephone Encounter (Signed)
Copied from CRM [id number]. Topic: Quick Communication - See Telephone Encounter >> May 27, 2018 12:17 PM Eston Mould B wrote: CRM for notification. See Telephone encounter for: 05/27/18. Ester OT   for [facility] is requesting verbal orders for OT 1x 1 week and 2x2 week     Her contact number is [phone]

## 2018-05-28 ENCOUNTER — Telehealth: Payer: Self-pay

## 2018-05-28 DIAGNOSIS — E1122 Type 2 diabetes mellitus with diabetic chronic kidney disease: Secondary | ICD-10-CM | POA: Diagnosis not present

## 2018-05-28 DIAGNOSIS — J9621 Acute and chronic respiratory failure with hypoxia: Secondary | ICD-10-CM | POA: Diagnosis not present

## 2018-05-28 DIAGNOSIS — N183 Chronic kidney disease, stage 3 (moderate): Secondary | ICD-10-CM | POA: Diagnosis not present

## 2018-05-28 DIAGNOSIS — M8580 Other specified disorders of bone density and structure, unspecified site: Secondary | ICD-10-CM | POA: Diagnosis not present

## 2018-05-28 DIAGNOSIS — I4891 Unspecified atrial fibrillation: Secondary | ICD-10-CM | POA: Diagnosis not present

## 2018-05-28 DIAGNOSIS — J441 Chronic obstructive pulmonary disease with (acute) exacerbation: Secondary | ICD-10-CM | POA: Diagnosis not present

## 2018-05-28 DIAGNOSIS — D649 Anemia, unspecified: Secondary | ICD-10-CM | POA: Diagnosis not present

## 2018-05-28 DIAGNOSIS — I129 Hypertensive chronic kidney disease with stage 1 through stage 4 chronic kidney disease, or unspecified chronic kidney disease: Secondary | ICD-10-CM | POA: Diagnosis not present

## 2018-05-28 DIAGNOSIS — K219 Gastro-esophageal reflux disease without esophagitis: Secondary | ICD-10-CM | POA: Diagnosis not present

## 2018-05-28 MED ORDER — NYSTATIN 100000 UNIT/ML MT SUSP: 5.0000 mL | Freq: Four times a day (QID) | OROMUCOSAL | 0 refills | Status: DC

## 2018-05-28 NOTE — Telephone Encounter (Signed)
Please give the order.  Thanks.   

## 2018-05-28 NOTE — Telephone Encounter (Signed)
Stacy aware/thx dmf

## 2018-05-28 NOTE — Telephone Encounter (Signed)
Copied from CRM #[id number]. Topic: Inquiry >> May 31, [phone]  3:24 PM Windy KalataMichael, Taylor L, NT wrote: Reason for CRM: Fulton Molelice is calling with [facility] and states the order she faxed this morning needs to be faxed too [phone]

## 2018-05-28 NOTE — Telephone Encounter (Signed)
Would try plain nystatin.  rx sent.  Thanks.  Update us as needed.

## 2018-05-28 NOTE — Telephone Encounter (Signed)
Noted/but I have yet to rec an order/thx dmf

## 2018-05-28 NOTE — Telephone Encounter (Signed)
Gave Ester verbal order per GD/thx dmf

## 2018-05-28 NOTE — Telephone Encounter (Signed)
Copied from CRM [id number]. Topic: General - Other >> May 28, 2018 10:37 AM Marylen PontoMcneil, Ja-Kwan wrote: Reason for CRM: Loura HaltStacy David with [facility] states she saw the pt today 05/28/18 and she is pretty certain pt has Thrush. Kennyth ArnoldStacy stated pt has typical symptom of white tongue and sore mouth and would like a Rx for Magic Mouth wash [facility] [place] [address] [phone] (Phone) [phone] (Fax). Kennyth ArnoldStacy cb# [phone]

## 2018-05-29 DIAGNOSIS — J441 Chronic obstructive pulmonary disease with (acute) exacerbation: Secondary | ICD-10-CM | POA: Diagnosis not present

## 2018-05-31 DIAGNOSIS — J441 Chronic obstructive pulmonary disease with (acute) exacerbation: Secondary | ICD-10-CM | POA: Diagnosis not present

## 2018-05-31 DIAGNOSIS — I4891 Unspecified atrial fibrillation: Secondary | ICD-10-CM | POA: Diagnosis not present

## 2018-05-31 DIAGNOSIS — E1122 Type 2 diabetes mellitus with diabetic chronic kidney disease: Secondary | ICD-10-CM | POA: Diagnosis not present

## 2018-05-31 DIAGNOSIS — I129 Hypertensive chronic kidney disease with stage 1 through stage 4 chronic kidney disease, or unspecified chronic kidney disease: Secondary | ICD-10-CM | POA: Diagnosis not present

## 2018-05-31 DIAGNOSIS — M8580 Other specified disorders of bone density and structure, unspecified site: Secondary | ICD-10-CM | POA: Diagnosis not present

## 2018-05-31 DIAGNOSIS — J9621 Acute and chronic respiratory failure with hypoxia: Secondary | ICD-10-CM | POA: Diagnosis not present

## 2018-05-31 DIAGNOSIS — K219 Gastro-esophageal reflux disease without esophagitis: Secondary | ICD-10-CM | POA: Diagnosis not present

## 2018-05-31 DIAGNOSIS — D649 Anemia, unspecified: Secondary | ICD-10-CM | POA: Diagnosis not present

## 2018-05-31 DIAGNOSIS — N183 Chronic kidney disease, stage 3 (moderate): Secondary | ICD-10-CM | POA: Diagnosis not present

## 2018-05-31 NOTE — Telephone Encounter (Signed)
Form faxed to [facility] at number [phone].

## 2018-05-31 NOTE — Telephone Encounter (Signed)
Fulton Molelice says she faxed at 9:02am on 05/31 and received confirmation and will refax now. Please call her to confirm receipt.

## 2018-06-01 ENCOUNTER — Telehealth: Payer: Self-pay | Admitting: Family Medicine

## 2018-06-01 DIAGNOSIS — I4891 Unspecified atrial fibrillation: Secondary | ICD-10-CM | POA: Diagnosis not present

## 2018-06-01 DIAGNOSIS — K219 Gastro-esophageal reflux disease without esophagitis: Secondary | ICD-10-CM | POA: Diagnosis not present

## 2018-06-01 DIAGNOSIS — J9621 Acute and chronic respiratory failure with hypoxia: Secondary | ICD-10-CM | POA: Diagnosis not present

## 2018-06-01 DIAGNOSIS — M8580 Other specified disorders of bone density and structure, unspecified site: Secondary | ICD-10-CM | POA: Diagnosis not present

## 2018-06-01 DIAGNOSIS — J441 Chronic obstructive pulmonary disease with (acute) exacerbation: Secondary | ICD-10-CM | POA: Diagnosis not present

## 2018-06-01 DIAGNOSIS — N183 Chronic kidney disease, stage 3 (moderate): Secondary | ICD-10-CM | POA: Diagnosis not present

## 2018-06-01 DIAGNOSIS — I129 Hypertensive chronic kidney disease with stage 1 through stage 4 chronic kidney disease, or unspecified chronic kidney disease: Secondary | ICD-10-CM | POA: Diagnosis not present

## 2018-06-01 DIAGNOSIS — E1122 Type 2 diabetes mellitus with diabetic chronic kidney disease: Secondary | ICD-10-CM | POA: Diagnosis not present

## 2018-06-01 DIAGNOSIS — D649 Anemia, unspecified: Secondary | ICD-10-CM | POA: Diagnosis not present

## 2018-06-01 NOTE — Telephone Encounter (Signed)
Order given to  Elease HashimotoPatricia, RN, [facility].

## 2018-06-01 NOTE — Telephone Encounter (Signed)
Please give the order.  Thanks.   

## 2018-06-01 NOTE — Telephone Encounter (Signed)
Copied from CRM [id number]. Topic: Quick Communication - See Telephone Encounter >> Jun 01, 2018 10:21 AM Jolayne Hainesaylor, Brittany L wrote: CRM for notification. See Telephone encounter for: 06/01/18.  Elease HashimotoPatricia, RN from [facility] called and would like to get orders to turn her oxygen up to 3 liters. She has a long tubing and she doesn't think she is getting the whole 2 liters. Call back @ [phone].

## 2018-06-02 DIAGNOSIS — D649 Anemia, unspecified: Secondary | ICD-10-CM | POA: Diagnosis not present

## 2018-06-02 DIAGNOSIS — I4891 Unspecified atrial fibrillation: Secondary | ICD-10-CM | POA: Diagnosis not present

## 2018-06-02 DIAGNOSIS — J9621 Acute and chronic respiratory failure with hypoxia: Secondary | ICD-10-CM | POA: Diagnosis not present

## 2018-06-02 DIAGNOSIS — N183 Chronic kidney disease, stage 3 (moderate): Secondary | ICD-10-CM | POA: Diagnosis not present

## 2018-06-02 DIAGNOSIS — I129 Hypertensive chronic kidney disease with stage 1 through stage 4 chronic kidney disease, or unspecified chronic kidney disease: Secondary | ICD-10-CM | POA: Diagnosis not present

## 2018-06-02 DIAGNOSIS — K219 Gastro-esophageal reflux disease without esophagitis: Secondary | ICD-10-CM | POA: Diagnosis not present

## 2018-06-02 DIAGNOSIS — J441 Chronic obstructive pulmonary disease with (acute) exacerbation: Secondary | ICD-10-CM | POA: Diagnosis not present

## 2018-06-02 DIAGNOSIS — M8580 Other specified disorders of bone density and structure, unspecified site: Secondary | ICD-10-CM | POA: Diagnosis not present

## 2018-06-02 DIAGNOSIS — E1122 Type 2 diabetes mellitus with diabetic chronic kidney disease: Secondary | ICD-10-CM | POA: Diagnosis not present

## 2018-06-03 DIAGNOSIS — E1122 Type 2 diabetes mellitus with diabetic chronic kidney disease: Secondary | ICD-10-CM | POA: Diagnosis not present

## 2018-06-03 DIAGNOSIS — I4891 Unspecified atrial fibrillation: Secondary | ICD-10-CM | POA: Diagnosis not present

## 2018-06-03 DIAGNOSIS — J441 Chronic obstructive pulmonary disease with (acute) exacerbation: Secondary | ICD-10-CM | POA: Diagnosis not present

## 2018-06-03 DIAGNOSIS — J9621 Acute and chronic respiratory failure with hypoxia: Secondary | ICD-10-CM | POA: Diagnosis not present

## 2018-06-03 DIAGNOSIS — D649 Anemia, unspecified: Secondary | ICD-10-CM | POA: Diagnosis not present

## 2018-06-03 DIAGNOSIS — M8580 Other specified disorders of bone density and structure, unspecified site: Secondary | ICD-10-CM | POA: Diagnosis not present

## 2018-06-03 DIAGNOSIS — N183 Chronic kidney disease, stage 3 (moderate): Secondary | ICD-10-CM | POA: Diagnosis not present

## 2018-06-03 DIAGNOSIS — I129 Hypertensive chronic kidney disease with stage 1 through stage 4 chronic kidney disease, or unspecified chronic kidney disease: Secondary | ICD-10-CM | POA: Diagnosis not present

## 2018-06-03 DIAGNOSIS — K219 Gastro-esophageal reflux disease without esophagitis: Secondary | ICD-10-CM | POA: Diagnosis not present

## 2018-06-04 DIAGNOSIS — J449 Chronic obstructive pulmonary disease, unspecified: Secondary | ICD-10-CM | POA: Diagnosis not present

## 2018-06-04 DIAGNOSIS — J441 Chronic obstructive pulmonary disease with (acute) exacerbation: Secondary | ICD-10-CM | POA: Diagnosis not present

## 2018-06-07 ENCOUNTER — Other Ambulatory Visit: Payer: Self-pay

## 2018-06-07 NOTE — Patient Outreach (Signed)
[facility] ([facility] Care Management  06/07/2018  Dominique Clark 08/01/1941 [id number]  EMMI: COPD red alert Referral date: 06/07/18 Referral reason: lost interest in things the use to do: yes [facility]  Telephone call to patient regarding EMMI COPD red alert. HIPAA verified with patient. Explained reason for call. Patient states, " its not that I don't have interest in doing things, I just can't do them right now due to my health. "  Patient reports she has not had a follow up appointment with her primary MD since discharged from the hospital   Patient states she hasn't been able to. Patient states she is receiving therapy services with [facility] and has not been able to get in to see her doctor.  RNCM discussed with patient importance of having follow up with her doctor. Patient states, " I know.  My son or I will call and try to schedule for this week."  Patient states she has transportation to her appointment.   Patient reports having  Her medications and takes them as prescribed. Patient states her granddaughter sets her medications up for her.  Patient denies any new symptoms. RNCM reviewed signs/ symptoms of COPD with patient. Advised to call doctor when needed for mild symptoms.  Call 911 for severe symptoms.   RNCM discussed EMMI COPD program with patient. Patient aware she will continue to get automated calls over 2 weeks. Patient agreed to ongoing automated calls.  Patient requested [facility] contact information and 24 hour nurse line number be mailed to her.   PLAN: RNCM will close patient due to patient refusing services.  RNCM will send closure notification to primary MD.  [facility], will send patient [facility] care [facility] brochure/ magnet to patient.   George InaDavina Laderrick Wilk RN,BSN,CCM [facility] Telephonic  [phone]

## 2018-06-08 DIAGNOSIS — I4891 Unspecified atrial fibrillation: Secondary | ICD-10-CM | POA: Diagnosis not present

## 2018-06-08 DIAGNOSIS — I129 Hypertensive chronic kidney disease with stage 1 through stage 4 chronic kidney disease, or unspecified chronic kidney disease: Secondary | ICD-10-CM | POA: Diagnosis not present

## 2018-06-08 DIAGNOSIS — M8580 Other specified disorders of bone density and structure, unspecified site: Secondary | ICD-10-CM | POA: Diagnosis not present

## 2018-06-08 DIAGNOSIS — K219 Gastro-esophageal reflux disease without esophagitis: Secondary | ICD-10-CM | POA: Diagnosis not present

## 2018-06-08 DIAGNOSIS — D649 Anemia, unspecified: Secondary | ICD-10-CM | POA: Diagnosis not present

## 2018-06-08 DIAGNOSIS — N183 Chronic kidney disease, stage 3 (moderate): Secondary | ICD-10-CM | POA: Diagnosis not present

## 2018-06-08 DIAGNOSIS — E1122 Type 2 diabetes mellitus with diabetic chronic kidney disease: Secondary | ICD-10-CM | POA: Diagnosis not present

## 2018-06-08 DIAGNOSIS — J9621 Acute and chronic respiratory failure with hypoxia: Secondary | ICD-10-CM | POA: Diagnosis not present

## 2018-06-08 DIAGNOSIS — J441 Chronic obstructive pulmonary disease with (acute) exacerbation: Secondary | ICD-10-CM | POA: Diagnosis not present

## 2018-06-09 DIAGNOSIS — M8580 Other specified disorders of bone density and structure, unspecified site: Secondary | ICD-10-CM | POA: Diagnosis not present

## 2018-06-09 DIAGNOSIS — I4891 Unspecified atrial fibrillation: Secondary | ICD-10-CM | POA: Diagnosis not present

## 2018-06-09 DIAGNOSIS — E1122 Type 2 diabetes mellitus with diabetic chronic kidney disease: Secondary | ICD-10-CM | POA: Diagnosis not present

## 2018-06-09 DIAGNOSIS — I129 Hypertensive chronic kidney disease with stage 1 through stage 4 chronic kidney disease, or unspecified chronic kidney disease: Secondary | ICD-10-CM | POA: Diagnosis not present

## 2018-06-09 DIAGNOSIS — D649 Anemia, unspecified: Secondary | ICD-10-CM | POA: Diagnosis not present

## 2018-06-09 DIAGNOSIS — N183 Chronic kidney disease, stage 3 (moderate): Secondary | ICD-10-CM | POA: Diagnosis not present

## 2018-06-09 DIAGNOSIS — J441 Chronic obstructive pulmonary disease with (acute) exacerbation: Secondary | ICD-10-CM | POA: Diagnosis not present

## 2018-06-09 DIAGNOSIS — J9621 Acute and chronic respiratory failure with hypoxia: Secondary | ICD-10-CM | POA: Diagnosis not present

## 2018-06-09 DIAGNOSIS — K219 Gastro-esophageal reflux disease without esophagitis: Secondary | ICD-10-CM | POA: Diagnosis not present

## 2018-06-10 DIAGNOSIS — J9621 Acute and chronic respiratory failure with hypoxia: Secondary | ICD-10-CM | POA: Diagnosis not present

## 2018-06-10 DIAGNOSIS — J441 Chronic obstructive pulmonary disease with (acute) exacerbation: Secondary | ICD-10-CM | POA: Diagnosis not present

## 2018-06-10 DIAGNOSIS — N183 Chronic kidney disease, stage 3 (moderate): Secondary | ICD-10-CM | POA: Diagnosis not present

## 2018-06-10 DIAGNOSIS — E1122 Type 2 diabetes mellitus with diabetic chronic kidney disease: Secondary | ICD-10-CM | POA: Diagnosis not present

## 2018-06-10 DIAGNOSIS — K219 Gastro-esophageal reflux disease without esophagitis: Secondary | ICD-10-CM | POA: Diagnosis not present

## 2018-06-10 DIAGNOSIS — D649 Anemia, unspecified: Secondary | ICD-10-CM | POA: Diagnosis not present

## 2018-06-10 DIAGNOSIS — M8580 Other specified disorders of bone density and structure, unspecified site: Secondary | ICD-10-CM | POA: Diagnosis not present

## 2018-06-10 DIAGNOSIS — I4891 Unspecified atrial fibrillation: Secondary | ICD-10-CM | POA: Diagnosis not present

## 2018-06-10 DIAGNOSIS — I129 Hypertensive chronic kidney disease with stage 1 through stage 4 chronic kidney disease, or unspecified chronic kidney disease: Secondary | ICD-10-CM | POA: Diagnosis not present

## 2018-06-10 NOTE — Patient Outreach (Signed)
Patient triggered Red on Emmi COPD dashboard, notification sent to:  Colleen CanLinda Manning, RN

## 2018-06-11 ENCOUNTER — Other Ambulatory Visit: Payer: Self-pay | Admitting: *Deleted

## 2018-06-11 DIAGNOSIS — D649 Anemia, unspecified: Secondary | ICD-10-CM | POA: Diagnosis not present

## 2018-06-11 DIAGNOSIS — E1122 Type 2 diabetes mellitus with diabetic chronic kidney disease: Secondary | ICD-10-CM | POA: Diagnosis not present

## 2018-06-11 DIAGNOSIS — M8580 Other specified disorders of bone density and structure, unspecified site: Secondary | ICD-10-CM | POA: Diagnosis not present

## 2018-06-11 DIAGNOSIS — N183 Chronic kidney disease, stage 3 (moderate): Secondary | ICD-10-CM | POA: Diagnosis not present

## 2018-06-11 DIAGNOSIS — I4891 Unspecified atrial fibrillation: Secondary | ICD-10-CM | POA: Diagnosis not present

## 2018-06-11 DIAGNOSIS — K219 Gastro-esophageal reflux disease without esophagitis: Secondary | ICD-10-CM | POA: Diagnosis not present

## 2018-06-11 DIAGNOSIS — I129 Hypertensive chronic kidney disease with stage 1 through stage 4 chronic kidney disease, or unspecified chronic kidney disease: Secondary | ICD-10-CM | POA: Diagnosis not present

## 2018-06-11 DIAGNOSIS — J441 Chronic obstructive pulmonary disease with (acute) exacerbation: Secondary | ICD-10-CM | POA: Diagnosis not present

## 2018-06-11 DIAGNOSIS — J9621 Acute and chronic respiratory failure with hypoxia: Secondary | ICD-10-CM | POA: Diagnosis not present

## 2018-06-11 NOTE — Patient Outreach (Signed)
[facility] Care Management  06/11/2018  Dominique Clark 07/21/1941 [id number]  Referral via Red Alert-EMMI COPD-Day#14, 06/09/2018: Reason: Been to hospital follow up-"no" Scheduled follow up appointment-"no"  Per chart review patient had recent hospital stay 5/15-5/23/2019 for COPD exacerbation.   Telephone call to patient who was advised of reason for call.  HIPPA verification received from patient.  Patient voices she is currently at home and has support from family members.  States she had not been to follow up appointment or made appointment when she received EMMI call. States since call she has made follow up appointment  with primary care provider-Dr. Crawford GivensGraham Duncan with appointment set for 06/15/2018. States her son will provide transportation to appointment.   Voices that she is taking medications as prescribed and knows why she is taking each pill. States her grand daughter fixes her medication box weekly, but states she is capable of fixing box herself. States she is using mail order & local pharmacy for medications.   Voices that she is currently using walker and trying to increase strength daily.  Voices that her breathing is okay but knows to use rescue inhaler if needed. States she has nebulizer but has not needed to use it since discharge to home. Voices she knows when to call MD with abnormal symptoms if they occur. States also knows when to call 911 if needed.   Patient [facility] services for COPD. Patient voices she is not interested at this time and declined services.   Patient voicing no further concerns at this time. EMMI call has been addressed.  Plan: Case closure.   Colleen CanLinda Zoa Dowty, RN BSN CCM Care Management Coordinator [facility] Care Management  [phone]

## 2018-06-14 DIAGNOSIS — M8580 Other specified disorders of bone density and structure, unspecified site: Secondary | ICD-10-CM | POA: Diagnosis not present

## 2018-06-14 DIAGNOSIS — E1122 Type 2 diabetes mellitus with diabetic chronic kidney disease: Secondary | ICD-10-CM | POA: Diagnosis not present

## 2018-06-14 DIAGNOSIS — J9621 Acute and chronic respiratory failure with hypoxia: Secondary | ICD-10-CM | POA: Diagnosis not present

## 2018-06-14 DIAGNOSIS — K219 Gastro-esophageal reflux disease without esophagitis: Secondary | ICD-10-CM | POA: Diagnosis not present

## 2018-06-14 DIAGNOSIS — N183 Chronic kidney disease, stage 3 (moderate): Secondary | ICD-10-CM | POA: Diagnosis not present

## 2018-06-14 DIAGNOSIS — I4891 Unspecified atrial fibrillation: Secondary | ICD-10-CM | POA: Diagnosis not present

## 2018-06-14 DIAGNOSIS — I129 Hypertensive chronic kidney disease with stage 1 through stage 4 chronic kidney disease, or unspecified chronic kidney disease: Secondary | ICD-10-CM | POA: Diagnosis not present

## 2018-06-14 DIAGNOSIS — J441 Chronic obstructive pulmonary disease with (acute) exacerbation: Secondary | ICD-10-CM | POA: Diagnosis not present

## 2018-06-14 DIAGNOSIS — D649 Anemia, unspecified: Secondary | ICD-10-CM | POA: Diagnosis not present

## 2018-06-15 ENCOUNTER — Encounter: Payer: Self-pay | Admitting: Family Medicine

## 2018-06-15 ENCOUNTER — Telehealth: Payer: Self-pay | Admitting: Radiology

## 2018-06-15 ENCOUNTER — Inpatient Hospital Stay (HOSPITAL_COMMUNITY)
Admission: EM | Admit: 2018-06-15 | Discharge: 2018-06-17 | DRG: 291 | Disposition: A | Discharge status: Home Health | Payer: Medicare HMO | Attending: Family Medicine | Admitting: Family Medicine

## 2018-06-15 ENCOUNTER — Encounter (HOSPITAL_COMMUNITY): Payer: Self-pay | Admitting: Emergency Medicine

## 2018-06-15 ENCOUNTER — Ambulatory Visit (INDEPENDENT_AMBULATORY_CARE_PROVIDER_SITE_OTHER): Payer: Medicare HMO | Admitting: Family Medicine

## 2018-06-15 ENCOUNTER — Other Ambulatory Visit: Payer: Self-pay

## 2018-06-15 ENCOUNTER — Emergency Department (HOSPITAL_COMMUNITY): Payer: Medicare HMO

## 2018-06-15 VITALS — BP 128/72 | HR 87 | Temp 98.5°F | Ht 62.5 in

## 2018-06-15 DIAGNOSIS — J449 Chronic obstructive pulmonary disease, unspecified: Secondary | ICD-10-CM | POA: Diagnosis present

## 2018-06-15 DIAGNOSIS — Z7951 Long term (current) use of inhaled steroids: Secondary | ICD-10-CM

## 2018-06-15 DIAGNOSIS — R739 Hyperglycemia, unspecified: Secondary | ICD-10-CM | POA: Diagnosis not present

## 2018-06-15 DIAGNOSIS — E1129 Type 2 diabetes mellitus with other diabetic kidney complication: Secondary | ICD-10-CM | POA: Diagnosis not present

## 2018-06-15 DIAGNOSIS — Z87891 Personal history of nicotine dependence: Secondary | ICD-10-CM

## 2018-06-15 DIAGNOSIS — I482 Chronic atrial fibrillation: Secondary | ICD-10-CM | POA: Diagnosis present

## 2018-06-15 DIAGNOSIS — I4891 Unspecified atrial fibrillation: Secondary | ICD-10-CM

## 2018-06-15 DIAGNOSIS — R609 Edema, unspecified: Secondary | ICD-10-CM

## 2018-06-15 DIAGNOSIS — N183 Chronic kidney disease, stage 3 (moderate): Secondary | ICD-10-CM | POA: Diagnosis present

## 2018-06-15 DIAGNOSIS — Z90710 Acquired absence of both cervix and uterus: Secondary | ICD-10-CM | POA: Diagnosis not present

## 2018-06-15 DIAGNOSIS — I4892 Unspecified atrial flutter: Secondary | ICD-10-CM | POA: Diagnosis present

## 2018-06-15 DIAGNOSIS — Z823 Family history of stroke: Secondary | ICD-10-CM

## 2018-06-15 DIAGNOSIS — I5033 Acute on chronic diastolic (congestive) heart failure: Secondary | ICD-10-CM | POA: Diagnosis present

## 2018-06-15 DIAGNOSIS — R0602 Shortness of breath: Secondary | ICD-10-CM

## 2018-06-15 DIAGNOSIS — D649 Anemia, unspecified: Secondary | ICD-10-CM | POA: Diagnosis present

## 2018-06-15 DIAGNOSIS — I130 Hypertensive heart and chronic kidney disease with heart failure and stage 1 through stage 4 chronic kidney disease, or unspecified chronic kidney disease: Secondary | ICD-10-CM | POA: Diagnosis present

## 2018-06-15 DIAGNOSIS — Z8673 Personal history of transient ischemic attack (TIA), and cerebral infarction without residual deficits: Secondary | ICD-10-CM | POA: Diagnosis not present

## 2018-06-15 DIAGNOSIS — K219 Gastro-esophageal reflux disease without esophagitis: Secondary | ICD-10-CM | POA: Diagnosis present

## 2018-06-15 DIAGNOSIS — J9611 Chronic respiratory failure with hypoxia: Secondary | ICD-10-CM | POA: Diagnosis present

## 2018-06-15 DIAGNOSIS — Z7189 Other specified counseling: Secondary | ICD-10-CM

## 2018-06-15 DIAGNOSIS — E1165 Type 2 diabetes mellitus with hyperglycemia: Secondary | ICD-10-CM | POA: Diagnosis present

## 2018-06-15 DIAGNOSIS — E1122 Type 2 diabetes mellitus with diabetic chronic kidney disease: Secondary | ICD-10-CM | POA: Diagnosis present

## 2018-06-15 DIAGNOSIS — Z8639 Personal history of other endocrine, nutritional and metabolic disease: Secondary | ICD-10-CM | POA: Diagnosis present

## 2018-06-15 DIAGNOSIS — Z7901 Long term (current) use of anticoagulants: Secondary | ICD-10-CM

## 2018-06-15 DIAGNOSIS — J9610 Chronic respiratory failure, unspecified whether with hypoxia or hypercapnia: Secondary | ICD-10-CM

## 2018-06-15 DIAGNOSIS — I509 Heart failure, unspecified: Secondary | ICD-10-CM

## 2018-06-15 DIAGNOSIS — I10 Essential (primary) hypertension: Secondary | ICD-10-CM

## 2018-06-15 DIAGNOSIS — Z79899 Other long term (current) drug therapy: Secondary | ICD-10-CM

## 2018-06-15 LAB — COMPREHENSIVE METABOLIC PANEL
ALBUMIN: 3.3 g/dL — AB (ref 3.5–5.2)
ALT: 13 U/L (ref 0–35)
ALT: 16 U/L (ref 14–54)
AST: 19 U/L (ref 0–37)
AST: 26 U/L (ref 15–41)
Albumin: 3 g/dL — ABNORMAL LOW (ref 3.5–5.0)
Alkaline Phosphatase: 64 U/L (ref 39–117)
Alkaline Phosphatase: 64 U/L (ref 38–126)
Anion gap: 10 (ref 5–15)
BILIRUBIN TOTAL: 0.3 mg/dL (ref 0.2–1.2)
BUN: 12 mg/dL (ref 6–23)
BUN: 11 mg/dL (ref 6–20)
CALCIUM: 9 mg/dL (ref 8.4–10.5)
CHLORIDE: 100 mmol/L — AB (ref 101–111)
CO2: 33 meq/L — AB (ref 19–32)
CO2: 28 mmol/L (ref 22–32)
CREATININE: 1 mg/dL (ref 0.44–1.00)
Calcium: 8.9 mg/dL (ref 8.9–10.3)
Chloride: 100 mEq/L (ref 96–112)
Creatinine, Ser: 0.84 mg/dL (ref 0.40–1.20)
GFR calc non Af Amer: 53 mL/min — ABNORMAL LOW (ref >60)
GFR: 69.72 mL/min (ref >60.00)
Glucose, Bld: 102 mg/dL — ABNORMAL HIGH (ref 70–99)
Glucose, Bld: 263 mg/dL — ABNORMAL HIGH (ref 65–99)
POTASSIUM: 4.9 mmol/L (ref 3.5–5.1)
Potassium: 4.2 mEq/L (ref 3.5–5.1)
SODIUM: 138 mmol/L (ref 135–145)
Sodium: 138 mEq/L (ref 135–145)
Total Bilirubin: 0.3 mg/dL (ref 0.3–1.2)
Total Protein: 6.3 g/dL (ref 6.0–8.3)
Total Protein: 6.4 g/dL — ABNORMAL LOW (ref 6.5–8.1)

## 2018-06-15 LAB — CBC WITH DIFFERENTIAL/PLATELET
BASOS PCT: 1.2 % (ref 0.0–3.0)
Basophils Absolute: 0.1 10*3/uL (ref 0.0–0.1)
Eosinophils Absolute: 0.2 10*3/uL (ref 0.0–0.7)
Eosinophils Relative: 1.9 % (ref 0.0–5.0)
LYMPHS PCT: 10.7 % — AB (ref 12.0–46.0)
Lymphs Abs: 1 10*3/uL (ref 0.7–4.0)
MCHC: 31.4 g/dL (ref 30.0–36.0)
MCV: 84.4 fl (ref 78.0–100.0)
MONOS PCT: 12.4 % — AB (ref 3.0–12.0)
Monocytes Absolute: 1.1 10*3/uL — ABNORMAL HIGH (ref 0.1–1.0)
NEUTROS ABS: 6.8 10*3/uL (ref 1.4–7.7)
Neutrophils Relative %: 73.8 % (ref 43.0–77.0)
PLATELETS: 502 10*3/uL — AB (ref 150.0–400.0)
RBC: 2.74 Mil/uL — ABNORMAL LOW (ref 3.87–5.11)
RDW: 17.3 % — AB (ref 11.5–15.5)
WBC: 9.2 10*3/uL (ref 4.0–10.5)

## 2018-06-15 LAB — BRAIN NATRIURETIC PEPTIDE
B NATRIURETIC PEPTIDE 5: 242.3 pg/mL — AB (ref 0.0–100.0)
PRO B NATRI PEPTIDE: 282 pg/mL — AB (ref 0.0–100.0)

## 2018-06-15 LAB — CBC
HEMATOCRIT: 26.2 % — AB (ref 36.0–46.0)
HEMOGLOBIN: 7.3 g/dL — AB (ref 12.0–15.0)
MCH: 25.3 pg — AB (ref 26.0–34.0)
MCHC: 27.9 g/dL — ABNORMAL LOW (ref 30.0–36.0)
MCV: 91 fL (ref 78.0–100.0)
PLATELETS: 492 10*3/uL — AB (ref 150–400)
RBC: 2.88 MIL/uL — AB (ref 3.87–5.11)
RDW: 17.3 % — ABNORMAL HIGH (ref 11.5–15.5)
WBC: 8.1 10*3/uL (ref 4.0–10.5)

## 2018-06-15 LAB — RETICULOCYTES
RBC.: 2.66 MIL/uL — ABNORMAL LOW (ref 3.87–5.11)
RETIC COUNT ABSOLUTE: 55.9 10*3/uL (ref 19.0–186.0)
Retic Ct Pct: 2.1 % (ref 0.4–3.1)

## 2018-06-15 LAB — HEMOGLOBIN A1C: HEMOGLOBIN A1C: 6.1 % (ref 4.6–6.5)

## 2018-06-15 LAB — VITAMIN B12: VITAMIN B 12: 199 pg/mL (ref 180–914)

## 2018-06-15 LAB — FOLATE: Folate: 12.5 ng/mL (ref >5.9)

## 2018-06-15 LAB — IRON AND TIBC
Iron: 10 ug/dL — ABNORMAL LOW (ref 28–170)
Saturation Ratios: 3 % — ABNORMAL LOW (ref 10.4–31.8)
TIBC: 342 ug/dL (ref 250–450)
UIBC: 332 ug/dL

## 2018-06-15 LAB — PREPARE RBC (CROSSMATCH)

## 2018-06-15 LAB — FERRITIN: FERRITIN: 8 ng/mL — AB (ref 11–307)

## 2018-06-15 LAB — POC OCCULT BLOOD, ED: FECAL OCCULT BLD: NEGATIVE

## 2018-06-15 LAB — ABO/RH: ABO/RH(D): A POS

## 2018-06-15 MED ORDER — METOPROLOL SUCCINATE ER 25 MG PO TB24
25.0000 mg | ORAL_TABLET | Freq: Every day | ORAL | Status: DC
  Filled 2018-06-15: qty 1

## 2018-06-15 MED ORDER — INSULIN ASPART 100 UNIT/ML ~~LOC~~ SOLN: 0.0000 [IU] | Freq: Every day | SUBCUTANEOUS | Status: DC

## 2018-06-15 MED ORDER — SODIUM CHLORIDE 0.9 % IV SOLN
10.0000 mL/h | Freq: Once | INTRAVENOUS | Status: AC
  Administered 2018-06-15: 10 mL/h via INTRAVENOUS

## 2018-06-15 MED ORDER — DILTIAZEM HCL 30 MG PO TABS
30.0000 mg | ORAL_TABLET | Freq: Three times a day (TID) | ORAL | Status: DC
  Administered 2018-06-16: 30 mg via ORAL
  Filled 2018-06-15 (×2): qty 1

## 2018-06-15 MED ORDER — DILTIAZEM HCL 25 MG/5ML IV SOLN
10.0000 mg | Freq: Once | INTRAVENOUS | Status: AC
  Administered 2018-06-16: 10 mg via INTRAVENOUS
  Filled 2018-06-15: qty 5

## 2018-06-15 MED ORDER — SODIUM CHLORIDE 0.9 % IV SOLN: 250.0000 mL | INTRAVENOUS | Status: DC | PRN

## 2018-06-15 MED ORDER — FUROSEMIDE 10 MG/ML IJ SOLN
40.0000 mg | Freq: Once | INTRAMUSCULAR | Status: AC
  Administered 2018-06-16: 40 mg via INTRAVENOUS
  Filled 2018-06-15: qty 4

## 2018-06-15 MED ORDER — FUROSEMIDE 10 MG/ML IJ SOLN
40.0000 mg | Freq: Once | INTRAMUSCULAR | Status: AC
  Administered 2018-06-15: 40 mg via INTRAVENOUS
  Filled 2018-06-15: qty 4

## 2018-06-15 MED ORDER — PREDNISONE 10 MG PO TABS
ORAL_TABLET | ORAL | 0 refills | Status: DC
Start: 2018-06-15 — End: 2018-06-17

## 2018-06-15 MED ORDER — TRAZODONE HCL 50 MG PO TABS: 25.0000 mg | ORAL_TABLET | Freq: Every evening | ORAL | 3 refills | Status: DC | PRN

## 2018-06-15 MED ORDER — ACETAMINOPHEN 650 MG RE SUPP: 650.0000 mg | Freq: Four times a day (QID) | RECTAL | Status: DC | PRN

## 2018-06-15 MED ORDER — PANTOPRAZOLE SODIUM 40 MG PO TBEC
40.0000 mg | DELAYED_RELEASE_TABLET | Freq: Every day | ORAL | Status: DC
  Administered 2018-06-16 – 2018-06-17 (×2): 40 mg via ORAL
  Filled 2018-06-15 (×2): qty 1

## 2018-06-15 MED ORDER — HYDROCODONE-ACETAMINOPHEN 5-325 MG PO TABS: 1.0000 | ORAL_TABLET | ORAL | Status: DC | PRN

## 2018-06-15 MED ORDER — APIXABAN 5 MG PO TABS
5.0000 mg | ORAL_TABLET | Freq: Two times a day (BID) | ORAL | Status: DC
  Administered 2018-06-16 – 2018-06-17 (×3): 5 mg via ORAL
  Filled 2018-06-15 (×3): qty 1

## 2018-06-15 MED ORDER — IPRATROPIUM-ALBUTEROL 0.5-2.5 (3) MG/3ML IN SOLN: 3.0000 mL | RESPIRATORY_TRACT | Status: DC | PRN

## 2018-06-15 MED ORDER — MOMETASONE FURO-FORMOTEROL FUM 200-5 MCG/ACT IN AERO
2.0000 | INHALATION_SPRAY | Freq: Two times a day (BID) | RESPIRATORY_TRACT | Status: DC
  Administered 2018-06-16 – 2018-06-17 (×3): 2 via RESPIRATORY_TRACT
  Filled 2018-06-15: qty 8.8

## 2018-06-15 MED ORDER — ONDANSETRON HCL 4 MG/2ML IJ SOLN: 4.0000 mg | Freq: Four times a day (QID) | INTRAMUSCULAR | Status: DC | PRN

## 2018-06-15 MED ORDER — TRAZODONE HCL 50 MG PO TABS: 25.0000 mg | ORAL_TABLET | Freq: Every evening | ORAL | Status: DC | PRN

## 2018-06-15 MED ORDER — INSULIN ASPART 100 UNIT/ML ~~LOC~~ SOLN
0.0000 [IU] | Freq: Three times a day (TID) | SUBCUTANEOUS | Status: DC
  Administered 2018-06-17: 1 [IU] via SUBCUTANEOUS

## 2018-06-15 MED ORDER — ONDANSETRON HCL 4 MG PO TABS: 4.0000 mg | ORAL_TABLET | Freq: Four times a day (QID) | ORAL | Status: DC | PRN

## 2018-06-15 MED ORDER — ACETAMINOPHEN 325 MG PO TABS: 650.0000 mg | ORAL_TABLET | Freq: Four times a day (QID) | ORAL | Status: DC | PRN

## 2018-06-15 MED ORDER — SODIUM CHLORIDE 0.9% FLUSH
3.0000 mL | Freq: Two times a day (BID) | INTRAVENOUS | Status: DC
  Administered 2018-06-16 (×3): 3 mL via INTRAVENOUS

## 2018-06-15 MED ORDER — ATORVASTATIN CALCIUM 20 MG PO TABS
20.0000 mg | ORAL_TABLET | Freq: Every day | ORAL | Status: DC
  Administered 2018-06-16: 20 mg via ORAL
  Filled 2018-06-15 (×2): qty 1

## 2018-06-15 MED ORDER — SODIUM CHLORIDE 0.9% FLUSH
3.0000 mL | Freq: Two times a day (BID) | INTRAVENOUS | Status: DC
  Administered 2018-06-16 – 2018-06-17 (×4): 3 mL via INTRAVENOUS

## 2018-06-15 MED ORDER — VITAMIN D 1000 UNITS PO TABS
1000.0000 [IU] | ORAL_TABLET | Freq: Every day | ORAL | Status: DC
  Administered 2018-06-16 – 2018-06-17 (×2): 1000 [IU] via ORAL
  Filled 2018-06-15 (×2): qty 1

## 2018-06-15 MED ORDER — SODIUM CHLORIDE 0.9 % IV BOLUS
500.0000 mL | Freq: Once | INTRAVENOUS | Status: DC
Start: 2018-06-15 — End: 2018-06-15

## 2018-06-15 MED ORDER — SODIUM CHLORIDE 0.9% FLUSH: 3.0000 mL | INTRAVENOUS | Status: DC | PRN

## 2018-06-15 NOTE — Telephone Encounter (Signed)
PEC reports that patient phoned back saying she was going to the ER, son was taking her.

## 2018-06-15 NOTE — Telephone Encounter (Signed)
Call pt.  She has a sig drop in HGB- advise ER.  I wasn't expecting this.   Thanks.

## 2018-06-15 NOTE — Progress Notes (Signed)
Hospital follow up. 93% on 3L her at OV.  Hospital course discussed with patient.  She improved with BiPAP, steroids, supportive care.Still on baseline inhalers and nebs.  Still on anticoagulation at baseline. Not currently on prednisone.  She was at home with family, at her house, she didn't require placement.    Fall alert button handout given.  Discussed routine fall cautions.  No fevers some some wheeze. Some SOB with inc in BLE edema.  Insomnia noted, unclear source, possibly from upheaval from the sickness. Minimal sputum, white, clearly better than prev.    We talked about her code status.  She was okay with intubation if needed but only if she had a decent chance of recovery; she didn't want long term ventilation in the future.    PMH and SH reviewed  ROS: Per HPI unless specifically indicated in ROS section   Meds, vitals, and allergies reviewed.   nad Chronically but not acutely ill, in wheelchair.  This appears to be her baseline. nad ncat Normal speech MMM Neck supple, no LA Scant ext wheeze.   ctab o/w. No inc wob.  RRR abd soft 1+ BLE edema.

## 2018-06-15 NOTE — Telephone Encounter (Signed)
[facility] called critical labs, HGB - 7.3, HCT  23.1, results given to Dr Para Marchuncan

## 2018-06-15 NOTE — Patient Instructions (Addendum)
Go to the lab on the way out.  We'll contact you with your lab report. Start prednisone in the meantime.  See if that helps and update me as needed.  Take it with food.   Try trazodone if needed for sleep if you don't do better with the prednisone.  Update me as needed.   Take care.  Glad to see you.

## 2018-06-15 NOTE — ED Notes (Signed)
ED Provider at bedside. 

## 2018-06-15 NOTE — Telephone Encounter (Signed)
I don't want her to need to go in the first place, but I think she should get this checked out at the ER.  If she drops more, then she could decompensate quickly.  I would go to the ER.

## 2018-06-15 NOTE — ED Notes (Signed)
Pt in room and alert [place] placed Call light within reach no family @ bedside currently

## 2018-06-15 NOTE — Telephone Encounter (Signed)
Patient called back and says she will go to the ED and her son will take her. She asks will they know she needs a blood transfusion, I advised to let them know her doctor sent her for a low hemoglobin, she verbalized understanding.

## 2018-06-15 NOTE — ED Provider Notes (Addendum)
[facility] EMERGENCY DEPARTMENT Provider Note   CSN: [id number] Arrival date & time: 06/15/18  1837     History   Chief Complaint Chief Complaint  Patient presents with  . Anemia    HPI Dominique Clark is a 77 y.o. female with PMH of A fib, chronic respiratory failure on 3 L [place], COPD, HFpEF, T2DM, TIA, CKD stage 3, normocytic anemia, GERD, normocytic anemia here for anemia  Patient was at her doctor today for a hospital follow up and she was noted to have a hemoglobin of 7.  She has endorsed feeling weak with shortness of breath since she was discharged from the hospital in late May.  At times when she ambulates she will feel lightheaded as well.  She feels generally weaker than normal.  She has had a normal appetite.  Denies any nausea, vomiting, abdominal pain, diarrhea, constipation, hematochezia.  Denies any dark tarry stools.  She notes that she has a history of anemia and has been told before that she has a vitamin deficiency. Does admit to lower extremity edema for the last week associated with leg pain. No fevers at home.   HPI  Past Medical History:  Diagnosis Date  . AF (atrial fibrillation) (HCC)   . Asthma   . COPD (chronic obstructive pulmonary disease) (HCC)   . Diabetes mellitus    type 2  . Diverticulosis    s/p colonoscopy  . Gastritis    via EGD  . Hiatal hernia    s/p dilation 2004  . Osteopenia   . Stress incontinence, female   . TIA (transient ischemic attack)     Patient Active Problem List   Diagnosis Date Noted  . Type II diabetes mellitus with renal manifestations (HCC) 05/13/2018  . GERD (gastroesophageal reflux disease) 05/13/2018  . Acute on chronic respiratory failure with hypoxia (HCC) 05/13/2018  . Normocytic anemia 05/13/2018  . COPD exacerbation (HCC) 05/13/2018  . AF (atrial fibrillation) (HCC)   . TIA (transient ischemic attack) 11/27/2017  . Atrial fibrillation with RVR (HCC) 11/27/2017  . Elevated serum  creatinine 10/16/2016  . Healthcare maintenance 10/16/2016  . Essential hypertension 10/16/2016  . Acute non-recurrent maxillary sinusitis 04/30/2016  . Medicare annual wellness visit, initial 10/08/2015  . Advance care planning 10/08/2015  . Knee pain 02/23/2015  . Stricture and stenosis of esophagus 02/20/2014  . Special screening for malignant neoplasms, colon 01/30/2014  . Hiatal hernia 04/20/2012  . Dysphagia 03/09/2012  . Vertigo, benign paroxysmal 05/02/2011  . Symptomatic anemia 04/17/2011  . Hyperglycemia 02/14/2011  . INCONTINENCE, FEMALE STRESS 10/11/2008  . GASTRITIS 05/30/2008  . ESOPHAGEAL REFLUX 02/24/2008  . Osteopenia 09/29/2007  . NECK AND BACK PAIN 07/26/2007  . COPD (chronic obstructive pulmonary disease) (HCC) 03/29/2007  . DIVERTICULOSIS, COLON W/O HEM 03/29/2007    Past Surgical History:  Procedure Laterality Date  . BLADDER REPAIR  9/04   bladder tack   . BREAST CYST EXCISION Right 1972  . TONSILLECTOMY     age 41  . TOTAL ABDOMINAL HYSTERECTOMY       OB History   None      Home Medications    Prior to Admission medications   Medication Sig Start Date End Date Taking? Authorizing Provider  albuterol (PROVENTIL HFA;VENTOLIN HFA) 108 (90 Base) MCG/ACT inhaler Inhale 1-2 puffs into the lungs every 4 (four) hours as needed for wheezing. Okay to fill with ventolin or proventil if needed. 10/16/16 05/12/26 Yes [name], MD  apixaban (ELIQUIS) 5 MG TABS tablet Take 1 tablet (5 mg total) by mouth 2 (two) times daily. 02/16/18  Yes Gollan, Tollie Pizzaimothy J, MD  atorvastatin (LIPITOR) 20 MG tablet Take 1 tablet (20 mg total) by mouth daily. 02/16/18  Yes Gollan, Tollie Pizzaimothy J, MD  budesonide-formoterol (SYMBICORT) 160-4.5 MCG/ACT inhaler INHALE 2 PUFFS EVERY 12 HOURS TO PREVENTCOUGH OR WHEEZING--RINSE, GARGLE, & SPITAFTER EACH USE. Patient taking differently: Inhale 2 puffs into the lungs every 12 (twelve) hours. TO PREVENT COUGH OR WHEEZING--RINSE, GARGLE, &  SPIT AFTER EACH USE. 10/16/16  Yes Joaquim Namuncan, Graham S, MD  cholecalciferol (VITAMIN D) 1000 units tablet Take 1,000 Units by mouth daily.   Yes [provider]  diltiazem (CARDIZEM) 30 MG tablet Take 1 tablet (30 mg total) by mouth 3 (three) times daily as needed. Patient taking differently: Take 30 mg by mouth 3 (three) times daily.  02/16/18  Yes Antonieta IbaGollan, Timothy J, MD  esomeprazole (NEXIUM) 20 MG capsule Take 20 mg by mouth daily at 12 noon.   Yes [provider]  fluticasone (FLONASE) 50 MCG/ACT nasal spray Place 2 sprays into both nostrils daily as needed (seasonal allergies). 01/01/18  Yes Joaquim Namuncan, Graham S, MD  ipratropium-albuterol (DUONEB) 0.5-2.5 (3) MG/3ML SOLN Take 3 mLs by nebulization every 4 (four) hours as needed. Dx:J44.9 03/08/18  Yes Shane Crutchamachandran, Pradeep, MD  metoprolol succinate (TOPROL-XL) 25 MG 24 hr tablet Take 1 tablet (25 mg total) by mouth daily. 04/20/18  Yes Joaquim Namuncan, Graham S, MD  predniSONE (DELTASONE) 10 MG tablet Take 2 a day for 5 days, then 1 a day for 5 days, with food. Don't take with aleve/ibuprofen. Patient taking differently: Take 10-20 mg by mouth See admin instructions. Take 2 a day for 5 days, then 1 a day for 5 days, with food. Don't take with aleve/ibuprofen. 06/15/18  Yes Joaquim Namuncan, Graham S, MD  NON FORMULARY Oxygen 3 liters 24/7    [provider]  traZODone (DESYREL) 50 MG tablet Take 0.5-1 tablets (25-50 mg total) by mouth at bedtime as needed for sleep. 06/15/18   Joaquim Namuncan, Graham S, MD    Family History Family History  Problem Relation Age of Onset  . Stroke Mother   . Breast cancer Neg Hx   . Colon cancer Neg Hx     Social History Social History   Tobacco Use  . Smoking status: Former Smoker    Types: Cigarettes    Last attempt to quit: 12/29/1988    Years since quitting: 29.4  . Smokeless tobacco: Never Used  . Tobacco comment: 20 or more years   Substance Use Topics  . Alcohol use: No    Alcohol/week: 0.0 oz  . Drug use:  No     Allergies   Doxycycline   Review of Systems Review of Systems: per HPI. Otherwise negative.    Physical Exam Updated Vital Signs BP (!) 114/53   Pulse (!) 129   Temp 98 F (36.7 C) (Oral)   Resp (!) 22   Ht 5\' 4"  (1.626 m)   Wt 65.8 kg (145 lb)   SpO2 100%   BMI 24.89 kg/m   Physical Exam  Constitutional: She is oriented to person, place, and time. She appears well-developed.  Pale appearing  HENT:  Head: Normocephalic.  Right Ear: External ear normal.  Left Ear: External ear normal.  Nose: Nose normal.  Mouth/Throat: Oropharynx is clear and moist.  Eyes: Pupils are equal, round, and reactive to light. Conjunctivae are normal.  Neck: Normal  range of motion. Neck supple.  Cardiovascular: Normal pulses. An irregularly irregular rhythm present. Tachycardia present.  Pulmonary/Chest: Effort normal and breath sounds normal. No respiratory distress. She has no wheezes.  Abdominal: Soft. Bowel sounds are normal.  Musculoskeletal: Normal range of motion. She exhibits edema (3+ pitting edema from midshin down laterally, skin is weeping).  Neurological: She is alert and oriented to person, place, and time. No cranial nerve deficit or sensory deficit. She exhibits normal muscle tone. Coordination normal.  Skin: Skin is warm and dry. Capillary refill takes less than 2 seconds. There is erythema (Erythema over bilateral ankles, more so on the left). There is pallor.  Psychiatric: She has a normal mood and affect. Her behavior is normal. Judgment and thought content normal.     ED Treatments / Results  Labs (all labs ordered are listed, but only abnormal results are displayed) Labs Reviewed  COMPREHENSIVE METABOLIC PANEL - Abnormal; Notable for the following components:      Result Value   Chloride 100 (*)    Glucose, Bld 263 (*)    Total Protein 6.4 (*)    Albumin 3.0 (*)    GFR calc non Af Amer 53 (*)    All other components within normal limits  CBC - Abnormal;  Notable for the following components:   RBC 2.88 (*)    Hemoglobin 7.3 (*)    HCT 26.2 (*)    MCH 25.3 (*)    MCHC 27.9 (*)    RDW 17.3 (*)    Platelets 492 (*)    All other components within normal limits  RETICULOCYTES - Abnormal; Notable for the following components:   RBC. 2.66 (*)    All other components within normal limits  VITAMIN B12  FOLATE  IRON AND TIBC  FERRITIN  BRAIN NATRIURETIC PEPTIDE  POC OCCULT BLOOD, ED  TYPE AND SCREEN  ABO/RH  PREPARE RBC (CROSSMATCH)    EKG EKG Interpretation  Date/Time:  Tuesday June 15 2018 18:46:45 EDT Ventricular Rate:  137 PR Interval:    QRS Duration: 74 QT Interval:  262 QTC Calculation: 395 R Axis:   72 Text Interpretation:  Atrial flutter with variable A-V block Nonspecific ST abnormality Abnormal ECG aflutter new since previous Confirmed by Richardean Canal [phone]) on 06/15/2018 9:32:39 PM   Radiology No results found.  Procedures Procedures (including critical care time)  Medications Ordered in ED Medications  0.9 %  sodium chloride infusion (10 mL/hr Intravenous New Bag/Given 06/15/18 2217)  furosemide (LASIX) injection 40 mg (40 mg Intravenous Given 06/15/18 2248)     Initial Impression / Assessment and Plan / ED Course  I have reviewed the triage vital signs and the nursing notes.  Pertinent labs & imaging results that were available during my care of the patient were reviewed by me and considered in my medical decision making (see chart for details).    77 year old female sent here from PCP office for hemoglobin of 7.  Here her hemoglobin is 7.3, MCV of 91.  No obvious source of bleeding.  FOBT collected, no blood or dark stool seen.  Anemia panel drawn. Due to patient's cardiac history and symptomatic anemia will transfuse 2 units.  She has known A. fib and is in A. fib with RVR with a rate up to 137 here. She is already anticoagulated with home Eliquis. Will hold off on adding an additional rate controlling  medication as she could be tachycardic from anemia. Will reassess rate after transfusion.  She does have significant lower extremity edema with signs of skin weeping. Does have HFpEF per Dec 2018 echo, ?Fluid overload. Lungs do have faint wheezing bilaterally but no crackles appreciated. CXR and BNP ordered. IV Lasix 40 mg ordered.   Discussed patient with [facility] hospitalist for admission for symptomatic anemia. Appreciate his assistance.   Final Clinical Impressions(s) / ED Diagnoses   Final diagnoses:  Chronic respiratory failure (HCC)  Symptomatic anemia  Edema, unspecified type    ED Discharge Orders    None       Beaulah Dinning, MD 06/15/18 2302    Beaulah Dinning, MD 06/15/18 2303    Charlynne Pander, MD 06/16/18 1350

## 2018-06-15 NOTE — Telephone Encounter (Signed)
Patient advised. Patient does not want to go to the ER.  Is there anything else she can do?

## 2018-06-15 NOTE — H&P (Signed)
History and Physical    Dominique Clark VWU:[id number] DOB: [birth year] DOA: 06/15/2018  PCP: [name], MD   Patient coming from: Home   Chief Complaint: SOB, fatigue, low Hgb   HPI: Dominique Clark is a 77 y.o. female with medical history significant for hypertension, COPD, chronic diastolic CHF, atrial fibrillation on Eliquis, and history of diabetes now diet controlled, presenting to the emergency department for evaluation of shortness of breath, fatigue, and low hemoglobin on outpatient blood work.  Patient reports the recent insidious development of progressive shortness of breath and fatigue, but denies any significant cough, chest pain, palpitations, fevers, or chills.  She was evaluated for these complaints by her PCP and outpatient blood work was obtained.  Patient was called, notified of a low hemoglobin, and directed to the ED for further evaluation and management.  The patient denies any melena, hematochezia, nominal pain, use of alcohol or NSAIDs.  ED Course: Upon arrival to the ED, patient is found to be afebrile, saturating low 90s on 2 L/min supplemental oxygen, tachycardic in the 130s, and with stable blood pressure.  EKG features atrial flutter.  Chest x-ray is negative for acute cardiopulmonary disease.  Mr. panel is notable for glucose of 263.  CBC features a hemoglobin of 7.3, down from the 9 range last month.  Fecal occult blood testing is negative.  Patient was given 40 mg of IV Lasix, anemia panel was sent, and 2 units of packed red blood cells were ordered from the emergency department for immediate transfusion.  Patient remains tachycardic with stable blood pressure and will be admitted for ongoing evaluation and management of symptomatic anemia.  Review of Systems:  All other systems reviewed and apart from HPI, are negative.  Past Medical History:  Diagnosis Date  . AF (atrial fibrillation) (HCC)   . Asthma   . COPD (chronic obstructive pulmonary disease) (HCC)     . Diabetes mellitus    type 2  . Diverticulosis    s/p colonoscopy  . Gastritis    via EGD  . Hiatal hernia    s/p dilation 2004  . Osteopenia   . Stress incontinence, female   . TIA (transient ischemic attack)     Past Surgical History:  Procedure Laterality Date  . BLADDER REPAIR  9/04   bladder tack   . BREAST CYST EXCISION Right 1972  . TONSILLECTOMY     age 37  . TOTAL ABDOMINAL HYSTERECTOMY       reports that she quit smoking about 29 years ago. Her smoking use included cigarettes. She has never used smokeless tobacco. She reports that she does not drink alcohol or use drugs.  Allergies  Allergen Reactions  . Doxycycline Nausea And Vomiting    Family History  Problem Relation Age of Onset  . Stroke Mother   . Breast cancer Neg Hx   . Colon cancer Neg Hx      Prior to Admission medications   Medication Sig Start Date End Date Taking? Authorizing Provider  albuterol (PROVENTIL HFA;VENTOLIN HFA) 108 (90 Base) MCG/ACT inhaler Inhale 1-2 puffs into the lungs every 4 (four) hours as needed for wheezing. Okay to fill with ventolin or proventil if needed. 10/16/16 05/12/26 Yes [name], MD  apixaban (ELIQUIS) 5 MG TABS tablet Take 1 tablet (5 mg total) by mouth 2 (two) times daily. 02/16/18  Yes Gollan, Tollie Pizza, MD  atorvastatin (LIPITOR) 20 MG tablet Take 1 tablet (20 mg total) by mouth daily.  02/16/18  Yes Gollan, Tollie Pizza, MD  budesonide-formoterol (SYMBICORT) 160-4.5 MCG/ACT inhaler INHALE 2 PUFFS EVERY 12 HOURS TO PREVENTCOUGH OR WHEEZING--RINSE, GARGLE, & SPITAFTER EACH USE. Patient taking differently: Inhale 2 puffs into the lungs every 12 (twelve) hours. TO PREVENT COUGH OR WHEEZING--RINSE, GARGLE, & SPIT AFTER EACH USE. 10/16/16  Yes [name], MD  cholecalciferol (VITAMIN D) 1000 units tablet Take 1,000 Units by mouth daily.   Yes [provider]  diltiazem (CARDIZEM) 30 MG tablet Take 1 tablet (30 mg total) by mouth 3 (three) times  daily as needed. Patient taking differently: Take 30 mg by mouth 3 (three) times daily.  02/16/18  Yes Antonieta Iba, MD  esomeprazole (NEXIUM) 20 MG capsule Take 20 mg by mouth daily at 12 noon.   Yes [provider]  fluticasone (FLONASE) 50 MCG/ACT nasal spray Place 2 sprays into both nostrils daily as needed (seasonal allergies). 01/01/18  Yes [name], MD  ipratropium-albuterol (DUONEB) 0.5-2.5 (3) MG/3ML SOLN Take 3 mLs by nebulization every 4 (four) hours as needed. Dx:J44.9 03/08/18  Yes Shane Crutch, MD  metoprolol succinate (TOPROL-XL) 25 MG 24 hr tablet Take 1 tablet (25 mg total) by mouth daily. 04/20/18  Yes [name], MD  predniSONE (DELTASONE) 10 MG tablet Take 2 a day for 5 days, then 1 a day for 5 days, with food. Don't take with aleve/ibuprofen. Patient taking differently: Take 10-20 mg by mouth See admin instructions. Take 2 a day for 5 days, then 1 a day for 5 days, with food. Don't take with aleve/ibuprofen. 06/15/18  Yes [name], MD  NON FORMULARY Oxygen 3 liters 24/7    [provider]  traZODone (DESYREL) 50 MG tablet Take 0.5-1 tablets (25-50 mg total) by mouth at bedtime as needed for sleep. 06/15/18   [name], MD    Physical Exam: Vitals:   06/15/18 2100 06/15/18 2115 06/15/18 2207 06/15/18 2227  BP: 119/85 (!) 111/54 (!) 118/57 (!) 114/53  Pulse: (!) 114  (!) 121 (!) 129  Resp: 20  (!) 22 (!) 22  Temp:   97.6 F (36.4 C) 98 F (36.7 C)  TempSrc:    Oral  SpO2: 100% 99%  100%  Weight:      Height:          Constitutional: NAD, calm  Eyes: PERTLA, lids and conjunctivae normal ENMT: Mucous membranes are moist. Posterior pharynx clear of any exudate or lesions.   Neck: normal, supple, no masses, no thyromegaly Respiratory: clear to auscultation bilaterally, no wheezing, no crackles. Normal respiratory effort.   Cardiovascular: Rate ~120 and irregular. 2+ pretibial edema bilaterally. No significant  JVD. Abdomen: No distension, no tenderness, soft. Bowel sounds active.  Musculoskeletal: no clubbing / cyanosis. No joint deformity upper and lower extremities.   Skin: no significant rashes, lesions, ulcers. Warm, dry, well-perfused. Neurologic: CN 2-12 grossly intact. Sensation intact. Strength 5/5 in all 4 limbs.  Psychiatric: Alert and oriented x 3. Pleasant and cooperative.     Labs on Admission: I have personally reviewed following labs and imaging studies  CBC: Recent Labs  Lab 06/15/18 1144 06/15/18 1851  WBC 9.2 8.1  NEUTROABS 6.8  --   HGB 7.3 Repeated and verified X2.* 7.3*  HCT 23.1 Repeated and verified X2.* 26.2*  MCV 84.4 91.0  PLT 502.0* 492*   Basic Metabolic Panel: Recent Labs  Lab 06/15/18 1144 06/15/18 1851  NA 138 138  K 4.2 4.9  CL  100 100*  CO2 33* 28  GLUCOSE 102* 263*  BUN 12 11  CREATININE 0.84 1.00  CALCIUM 9.0 8.9   GFR: Estimated Creatinine Clearance: 44 mL/min (by C-G formula based on SCr of 1 mg/dL). Liver Function Tests: Recent Labs  Lab 06/15/18 1144 06/15/18 1851  AST 19 26  ALT 13 16  ALKPHOS 64 64  BILITOT 0.3 0.3  PROT 6.3 6.4*  ALBUMIN 3.3* 3.0*   No results for input(s): LIPASE, AMYLASE in the last 168 hours. No results for input(s): AMMONIA in the last 168 hours. Coagulation Profile: No results for input(s): INR, PROTIME in the last 168 hours. Cardiac Enzymes: No results for input(s): CKTOTAL, CKMB, CKMBINDEX, TROPONINI in the last 168 hours. BNP (last 3 results) Recent Labs    06/15/18 1144  PROBNP 282.0*   HbA1C: Recent Labs    06/15/18 1144  HGBA1C 6.1   CBG: No results for input(s): GLUCAP in the last 168 hours. Lipid Profile: No results for input(s): CHOL, HDL, LDLCALC, TRIG, CHOLHDL, LDLDIRECT in the last 72 hours. Thyroid Function Tests: No results for input(s): TSH, T4TOTAL, FREET4, T3FREE, THYROIDAB in the last 72 hours. Anemia Panel: Recent Labs    06/15/18 2142  VITAMINB12 199  FOLATE  12.5  FERRITIN 8*  TIBC 342  IRON 10*  RETICCTPCT 2.1   Urine analysis:    Component Value Date/Time   COLORURINE STRAW (A) 05/12/2018 2344   APPEARANCEUR CLEAR 05/12/2018 2344   LABSPEC 1.006 05/12/2018 2344   PHURINE 6.0 05/12/2018 2344   GLUCOSEU 150 (A) 05/12/2018 2344   HGBUR NEGATIVE 05/12/2018 2344   HGBUR negative 10/11/2008 0926   BILIRUBINUR NEGATIVE 05/12/2018 2344   BILIRUBINUR Neg 05/12/2015 1234   KETONESUR NEGATIVE 05/12/2018 2344   PROTEINUR NEGATIVE 05/12/2018 2344   UROBILINOGEN 0.2 05/12/2015 1234   UROBILINOGEN 1.0 08/09/2012 1727   NITRITE NEGATIVE 05/12/2018 2344   LEUKOCYTESUR NEGATIVE 05/12/2018 2344   Sepsis Labs: @LABRCNTIP (procalcitonin:4,lacticidven:4) )No results found for this or any previous visit (from the past 240 hour(s)).   Radiological Exams on Admission: Dg Chest Port 1 View  Result Date: 06/15/2018 CLINICAL DATA:  77 y/o  F; shortness of breath and weakness. EXAM: PORTABLE CHEST 1 VIEW COMPARISON:  05/15/2018 chest radiograph FINDINGS: Stable mild cardiomegaly given projection and technique. Aortic atherosclerosis with calcification. Pleuroparenchymal scarring and emphysema in the lung apices. No focal consolidation. Hiatal hernia. No pleural effusion or pneumothorax. No acute osseous abnormality is evident. IMPRESSION: 1. No acute pulmonary process identified. 2. Stable cardiomegaly, aortic atherosclerosis, and findings of COPD. Electronically Signed   By: Mitzi HansenLance  Furusawa-Stratton M.D.   On: 06/15/2018 23:15    EKG: Independently reviewed. Atrial flutter.   Assessment/Plan  1. Symptomatic anemia  - Presents at direction of PCP for DOE and fatigue with Hgb 7.3, down from 9-range last month  - She denies melena or hematochezia and FOBT is negative  - BP has remained stable  - Anemia panel sent from ED and 2 units pRBCs ordered  - Follow-up anemia panel, check post-transfusion CBC    2. Atrial fibrillation with RVR  - Patient has  chronic atrial fibrillation with CHADS-VASc at least 4 (age x2, CHF, HTN)  - HR is 115-140 in ED  - Give 10 mg IV diltiazem, continue cardiac monitoring, continue oral diltiazem and Toprol, continue Eliquis   3. Acute on chronic diastolic CHF  - Presents with DOE, most of which is suspected secondary to #1, but she is also noted to have peripheral  edema and 8-lb wt gain over the past couple months  - She was given 40 mg IV Lasix in ED  - Repeat a dose of Lasix after transfusions, follow I/O and daily wts, continue beta-blocker    4. Hyperglycemia  - Serum glucose is 263 on admission  - A1c was 6.1% this month   - Follow CBG's and use a low-intensity SSI as needed    5. Hypertension  - BP at goal  - Continue Toprol as tolerated    6. COPD  - No cough or wheezing on admission  - Continue ICS/LABA and prn nebs     DVT prophylaxis: Eliquis  Code Status: Full  Family Communication: Discussed with patient Consults called: None Admission status: Observation     Briscoe Deutscher, MD [facility] Pager [phone]  If 7PM-7AM, please contact night-coverage [email or web address] Password [facility]  06/15/2018, 11:47 PM

## 2018-06-15 NOTE — ED Triage Notes (Signed)
Pt sent here by her MD for low hgb of 7. Pt reports feeling SOB, and weakness.

## 2018-06-16 ENCOUNTER — Other Ambulatory Visit: Payer: Self-pay

## 2018-06-16 DIAGNOSIS — Z823 Family history of stroke: Secondary | ICD-10-CM | POA: Diagnosis not present

## 2018-06-16 DIAGNOSIS — I4892 Unspecified atrial flutter: Secondary | ICD-10-CM | POA: Diagnosis present

## 2018-06-16 DIAGNOSIS — E1122 Type 2 diabetes mellitus with diabetic chronic kidney disease: Secondary | ICD-10-CM | POA: Diagnosis present

## 2018-06-16 DIAGNOSIS — J9611 Chronic respiratory failure with hypoxia: Secondary | ICD-10-CM | POA: Diagnosis present

## 2018-06-16 DIAGNOSIS — E1165 Type 2 diabetes mellitus with hyperglycemia: Secondary | ICD-10-CM | POA: Diagnosis present

## 2018-06-16 DIAGNOSIS — R0602 Shortness of breath: Secondary | ICD-10-CM | POA: Insufficient documentation

## 2018-06-16 DIAGNOSIS — D649 Anemia, unspecified: Secondary | ICD-10-CM | POA: Diagnosis present

## 2018-06-16 DIAGNOSIS — Z87891 Personal history of nicotine dependence: Secondary | ICD-10-CM | POA: Diagnosis not present

## 2018-06-16 DIAGNOSIS — K219 Gastro-esophageal reflux disease without esophagitis: Secondary | ICD-10-CM | POA: Diagnosis present

## 2018-06-16 DIAGNOSIS — J449 Chronic obstructive pulmonary disease, unspecified: Secondary | ICD-10-CM | POA: Diagnosis present

## 2018-06-16 DIAGNOSIS — I130 Hypertensive heart and chronic kidney disease with heart failure and stage 1 through stage 4 chronic kidney disease, or unspecified chronic kidney disease: Secondary | ICD-10-CM | POA: Diagnosis present

## 2018-06-16 DIAGNOSIS — Z8673 Personal history of transient ischemic attack (TIA), and cerebral infarction without residual deficits: Secondary | ICD-10-CM | POA: Diagnosis not present

## 2018-06-16 DIAGNOSIS — Z90710 Acquired absence of both cervix and uterus: Secondary | ICD-10-CM | POA: Diagnosis not present

## 2018-06-16 DIAGNOSIS — E1129 Type 2 diabetes mellitus with other diabetic kidney complication: Secondary | ICD-10-CM | POA: Diagnosis not present

## 2018-06-16 DIAGNOSIS — I509 Heart failure, unspecified: Secondary | ICD-10-CM

## 2018-06-16 DIAGNOSIS — I4891 Unspecified atrial fibrillation: Secondary | ICD-10-CM | POA: Diagnosis not present

## 2018-06-16 DIAGNOSIS — N183 Chronic kidney disease, stage 3 (moderate): Secondary | ICD-10-CM | POA: Diagnosis present

## 2018-06-16 DIAGNOSIS — I5033 Acute on chronic diastolic (congestive) heart failure: Secondary | ICD-10-CM | POA: Diagnosis present

## 2018-06-16 DIAGNOSIS — Z7951 Long term (current) use of inhaled steroids: Secondary | ICD-10-CM | POA: Diagnosis not present

## 2018-06-16 DIAGNOSIS — Z7901 Long term (current) use of anticoagulants: Secondary | ICD-10-CM | POA: Diagnosis not present

## 2018-06-16 DIAGNOSIS — I482 Chronic atrial fibrillation: Secondary | ICD-10-CM | POA: Diagnosis present

## 2018-06-16 DIAGNOSIS — Z79899 Other long term (current) drug therapy: Secondary | ICD-10-CM | POA: Diagnosis not present

## 2018-06-16 LAB — BASIC METABOLIC PANEL
Anion gap: 7 (ref 5–15)
BUN: 11 mg/dL (ref 6–20)
CALCIUM: 9.1 mg/dL (ref 8.9–10.3)
CO2: 33 mmol/L — AB (ref 22–32)
CREATININE: 0.92 mg/dL (ref 0.44–1.00)
Chloride: 99 mmol/L — ABNORMAL LOW (ref 101–111)
GFR calc Af Amer: >60 mL/min (ref >60)
GFR calc non Af Amer: 59 mL/min — ABNORMAL LOW (ref >60)
GLUCOSE: 117 mg/dL — AB (ref 65–99)
Potassium: 4.5 mmol/L (ref 3.5–5.1)
Sodium: 139 mmol/L (ref 135–145)

## 2018-06-16 LAB — GLUCOSE, CAPILLARY
GLUCOSE-CAPILLARY: 114 mg/dL — AB (ref 65–99)
Glucose-Capillary: 115 mg/dL — ABNORMAL HIGH (ref 65–99)
Glucose-Capillary: 97 mg/dL (ref 65–99)
Glucose-Capillary: 99 mg/dL (ref 65–99)
Glucose-Capillary: 103 mg/dL — ABNORMAL HIGH (ref 65–99)

## 2018-06-16 LAB — CBC
HEMATOCRIT: 32.6 % — AB (ref 36.0–46.0)
Hemoglobin: 10.1 g/dL — ABNORMAL LOW (ref 12.0–15.0)
MCH: 27.2 pg (ref 26.0–34.0)
MCHC: 31 g/dL (ref 30.0–36.0)
MCV: 87.6 fL (ref 78.0–100.0)
PLATELETS: 438 10*3/uL — AB (ref 150–400)
RBC: 3.72 MIL/uL — AB (ref 3.87–5.11)
RDW: 15.9 % — AB (ref 11.5–15.5)
WBC: 10.3 10*3/uL (ref 4.0–10.5)

## 2018-06-16 MED ORDER — DILTIAZEM HCL 30 MG PO TABS: 30.0000 mg | ORAL_TABLET | Freq: Three times a day (TID) | ORAL | Status: DC | PRN

## 2018-06-16 MED ORDER — METOPROLOL SUCCINATE ER 50 MG PO TB24
50.0000 mg | ORAL_TABLET | Freq: Every day | ORAL | Status: DC
  Administered 2018-06-16 – 2018-06-17 (×2): 50 mg via ORAL
  Filled 2018-06-16 (×2): qty 1

## 2018-06-16 MED ORDER — METOPROLOL TARTRATE 5 MG/5ML IV SOLN
2.5000 mg | Freq: Once | INTRAVENOUS | Status: AC
Start: 2018-06-16 — End: 2018-06-16
  Administered 2018-06-16: 2.5 mg via INTRAVENOUS

## 2018-06-16 MED ORDER — DOCUSATE SODIUM 100 MG PO CAPS
100.0000 mg | ORAL_CAPSULE | Freq: Two times a day (BID) | ORAL | Status: DC
  Administered 2018-06-16 – 2018-06-17 (×2): 100 mg via ORAL
  Filled 2018-06-16 (×2): qty 1

## 2018-06-16 MED ORDER — FERROUS SULFATE 325 (65 FE) MG PO TABS
325.0000 mg | ORAL_TABLET | Freq: Two times a day (BID) | ORAL | Status: DC
  Administered 2018-06-17: 325 mg via ORAL
  Filled 2018-06-16: qty 1

## 2018-06-16 MED ORDER — DILTIAZEM HCL ER COATED BEADS 120 MG PO CP24
120.0000 mg | ORAL_CAPSULE | Freq: Every day | ORAL | Status: DC
  Administered 2018-06-16 – 2018-06-17 (×2): 120 mg via ORAL
  Filled 2018-06-16 (×2): qty 1

## 2018-06-16 MED ORDER — FUROSEMIDE 10 MG/ML IJ SOLN
40.0000 mg | Freq: Once | INTRAMUSCULAR | Status: AC
  Administered 2018-06-16: 40 mg via INTRAVENOUS
  Filled 2018-06-16: qty 4

## 2018-06-16 MED ORDER — METOPROLOL TARTRATE 5 MG/5ML IV SOLN
5.0000 mg | Freq: Once | INTRAVENOUS | Status: DC
  Filled 2018-06-16: qty 5

## 2018-06-16 NOTE — Progress Notes (Signed)
Nsg attempted to ambulate pt without 02 and  HR went to 170's. Pt then sat on side of bed with 02 at 2/L and HR decreased to 140's. Dr. Maryfrances Bunnellanford made aware of numbers.  Patient states that she goes thru this scene rio at home everyday with elevated HR, shortness of breath. Then she will sit and rest and regain her composure and HR will decrease as her breathing eases.  Patient states she does wsear her 02 at home all the time.  Will continue to monitor..Dominique Clark

## 2018-06-16 NOTE — Assessment & Plan Note (Signed)
She had significant illness requiring hospitalization previously and it is reasonable to recheck routine labs today given her situation.  She appeared to be still okay for outpatient follow-up at time of exam.  We talked about adding on prednisone for a short period of time to see if it would help with her pulmonary symptoms.  I got a call back about her labs with a critical drop in her hemoglobin.  The patient was contacted and advised to go to the emergency room.  She will likely need transfusion.  This clearly could contribute to her shortness of breath.  I appreciate the help of all involved. >25 minutes spent in face to face time with patient, >50% spent in counselling or coordination of care.

## 2018-06-16 NOTE — Consult Note (Signed)
   [facility] CM Inpatient Consult   06/16/2018  Dominique Clark 1941-08-29 [id number]  Patient recently out reached from [facility] calls.  Chart review reveals from MD notes:  Dominique Clark is a 77 y.o. female with medical history significant for hypertension, COPD, chronic diastolic CHF, atrial fibrillation on Eliquis, and history of diabetes now diet controlled, presenting to the emergency department for evaluation of shortness of breath, fatigue, and low hemoglobin on outpatient blood work.  Patient reports the recent insidious development of progressive shortness of breath and fatigue, but denies any significant cough, chest pain, palpitations, fevers, or chills.  She was evaluated for these complaints by her PCP and outpatient blood work was obtained.  Patient was called, notified of a low hemoglobin, and directed to the ED for further evaluation and management.  The patient denies any melena, hematochezia, nominal pain, use of alcohol or NSAIDs.  Patient had declined [place], will follow up.  06/17/18 1045 am:  Met with the patient at the bedside regarding [facility] post hospital follow up.  Patient endorses [place] for following.  She states, "I really dislike the automated calls.  I am not a person who talks on the phone and I don't even talk to my friends because they understand that I don't talk due to being short of breath and if I am feeling good I still don't get on the phone."  Patient has declined health coach and EMMI calls.  She does accept the brochure and 24 hour nurse line with contact information.  She states, "Oh, this is a good deal so if I need to talk to a nurse I can call them?"  Expressed yes and this is a live nurse line call.   For questions, please contact:  Natividad Brood, RN BSN [facility] Liaison  [phone] business mobile phone Toll free office [phone]

## 2018-06-16 NOTE — Discharge Instructions (Signed)

## 2018-06-16 NOTE — Assessment & Plan Note (Signed)
We talked about her code status 06/15/18.  She was okay with intubation if needed but only if she had a decent chance of recovery; she didn't want long term ventilation in the future.

## 2018-06-16 NOTE — Care Management Note (Signed)
Case Management Note  Patient Details  Name: Dominique Clark MRN: [id number] Date of Birth: 03/20/1941  Subjective/Objective:                 [facility] from home alone [facility], has home O2 that she states she does not always wear. CM will continue to follow.    Action/Plan:   Expected Discharge Date:                  Expected Discharge Plan:  Home w Home Health Services  In-House Referral:     Discharge planning Services  CM Consult  Post Acute Care Choice:  Home Health, Resumption of Svcs/PTA Provider Choice offered to:     DME Arranged:    DME Agency:     HH Arranged:    HH Agency:  [facility]  Status of Service:  In process, will continue to follow  If discussed at Long Length of Stay Meetings, dates discussed:    Additional Comments:  Lawerance SabalDebbie Nykerria Macconnell, RN 06/16/2018, 4:54 PM

## 2018-06-16 NOTE — Telephone Encounter (Signed)
Noted. Thanks.  Was admitted.  Await inpatient reports.

## 2018-06-16 NOTE — Progress Notes (Signed)
PROGRESS NOTE    Dominique Clark  NFA:[id number] DOB: 07/28/1941 DOA: 06/15/2018 PCP: Joaquim Namuncan, Graham S, MD      Brief Narrative:  [name] is a 77 y.o. F with hypertension, COPD, chronic diastolic CHF, atrial fibrillation on Eliquis, and history of diabetes now diet controlled, presenting to the emergency department for evaluation of shortness of breath, fatigue, and low hemoglobin on outpatient blood work.  Patient reports the recent insidious development of progressive shortness of breath and fatigue, but denies any significant cough, chest pain, palpitations, fevers, or chills.  She was evaluated for these complaints by her PCP and outpatient blood work was obtained.  Patient was called, notified of a low hemoglobin, and directed to the ED for further evaluation and management.  The patient denies any melena, hematochezia, nominal pain, use of alcohol or NSAIDs.     Assessment & Plan:  Symptomatic anemia Transfused 2 units overnight.  Feels less dyspneic. FOBT negative.  Iron deficient based on ferritin, iron studies.   Appropriate bump post-transfusion. -Start oral iron and docusate -Outpatient referral for endoscopy  Atrial fibrillation with RVR CHADS2Vasc >2, on Eliquis. No active bleeding, no melena, no hematochezia. -Continue Eliquis -Start diltiazem CD -Repeat IV metoprolol -Increase oral metoprolol  Acute on chronic diastolic CHF -Furosemide 40 mg IV   -K supplement -Strict I/Os, daily weights, telemetry  -Daily monitoring renal function  Hypertension BP elevated -Continue statin -Increase metop, diltiazem  COPD  No wheezing  Diabetes -Continue SSI     DVT prophylaxis: Eliquis Code Status: FULL Family Communication: None present MDM and disposition Plan: The below labs and imaging reports were reviewed and summarized above.    The patient was admitted with symptomatic anemia, Afib with RVR and hypoxia requiring O2, from acute diastolic CHF flare.  Will  continue IV diuresis and IV rate control.  Likely home in 1-2 days.   Consultants:   None  Procedures:   None  Antimicrobials:   None    Subjective: Feels better after transfusion.  No chest pain, orthopnea.  No fever, cough.   NO confusion.  Objective: Vitals:   06/16/18 0454 06/16/18 0921 06/16/18 1159 06/16/18 1200  BP: (!) 125/92 (!) 97/45 (!) 123/97 (!) 123/97  Pulse: (!) 119 97 (!) 103 94  Resp: 18  16 16   Temp: 98.1 F (36.7 C) 97.9 F (36.6 C) (!) 97.5 F (36.4 C) (!) 97.5 F (36.4 C)  TempSrc: Oral Oral Oral Oral  SpO2: 98% 100%  100%  Weight:      Height:        Intake/Output Summary (Last 24 hours) at 06/16/2018 1744 Last data filed at 06/16/2018 1300 Gross per 24 hour  Intake 1549 ml  Output 4250 ml  Net -2701 ml   Filed Weights   06/15/18 1847  Weight: 65.8 kg (145 lb)    Examination: General appearance: thin adult female, alert and in mild respiratory distress.   HEENT: Anicteric, conjunctiva pink, lids and lashes normal. No nasal deformity, discharge, epistaxis.  Lips moist, OP moist, no oral lesions, hearing normal.   Skin: Warm and dry.  no jaundice.  No suspicious rashes or lesions. Cardiac: RRR, nl S1-S2, no murmurs appreciated.  Capillary refill is brisk.  JVP elevated.  No LE edema.  Radia  pulses 2+ and symmetric. Respiratory: Normal respiratory rate and rhythm.  Rales at bases. Abdomen: Abdomen soft.  No TTP. No ascites, distension, hepatosplenomegaly.   MSK: No deformities or effusions. Neuro: Awake and alert.  EOMI, moves all extremities. Speech fluent.    Psych: Sensorium intact and responding to questions, attention normal. Affect normal.  Judgment and insight appear normal.    Data Reviewed: I have personally reviewed following labs and imaging studies:  CBC: Recent Labs  Lab 06/15/18 1144 06/15/18 1851 06/16/18 1057  WBC 9.2 8.1 10.3  NEUTROABS 6.8  --   --   HGB 7.3 Repeated and verified X2.* 7.3* 10.1*  HCT 23.1  Repeated and verified X2.* 26.2* 32.6*  MCV 84.4 91.0 87.6  PLT 502.0* 492* 438*   Basic Metabolic Panel: Recent Labs  Lab 06/15/18 1144 06/15/18 1851 06/16/18 0102  NA 138 138 139  K 4.2 4.9 4.5  CL 100 100* 99*  CO2 33* 28 33*  GLUCOSE 102* 263* 117*  BUN 12 11 11   CREATININE 0.84 1.00 0.92  CALCIUM 9.0 8.9 9.1   GFR: Estimated Creatinine Clearance: 47.8 mL/min (by C-G formula based on SCr of 0.92 mg/dL). Liver Function Tests: Recent Labs  Lab 06/15/18 1144 06/15/18 1851  AST 19 26  ALT 13 16  ALKPHOS 64 64  BILITOT 0.3 0.3  PROT 6.3 6.4*  ALBUMIN 3.3* 3.0*   No results for input(s): LIPASE, AMYLASE in the last 168 hours. No results for input(s): AMMONIA in the last 168 hours. Coagulation Profile: No results for input(s): INR, PROTIME in the last 168 hours. Cardiac Enzymes: No results for input(s): CKTOTAL, CKMB, CKMBINDEX, TROPONINI in the last 168 hours. BNP (last 3 results) Recent Labs    06/15/18 1144  PROBNP 282.0*   HbA1C: Recent Labs    06/15/18 1144  HGBA1C 6.1   CBG: Recent Labs  Lab 06/16/18 0102 06/16/18 0803 06/16/18 1146 06/16/18 1638  GLUCAP 115* 97 99 103*   Lipid Profile: No results for input(s): CHOL, HDL, LDLCALC, TRIG, CHOLHDL, LDLDIRECT in the last 72 hours. Thyroid Function Tests: No results for input(s): TSH, T4TOTAL, FREET4, T3FREE, THYROIDAB in the last 72 hours. Anemia Panel: Recent Labs    06/15/18 2142  VITAMINB12 199  FOLATE 12.5  FERRITIN 8*  TIBC 342  IRON 10*  RETICCTPCT 2.1   Urine analysis:    Component Value Date/Time   COLORURINE STRAW (A) 05/12/2018 2344   APPEARANCEUR CLEAR 05/12/2018 2344   LABSPEC 1.006 05/12/2018 2344   PHURINE 6.0 05/12/2018 2344   GLUCOSEU 150 (A) 05/12/2018 2344   HGBUR NEGATIVE 05/12/2018 2344   HGBUR negative 10/11/2008 0926   BILIRUBINUR NEGATIVE 05/12/2018 2344   BILIRUBINUR Neg 05/12/2015 1234   KETONESUR NEGATIVE 05/12/2018 2344   PROTEINUR NEGATIVE 05/12/2018  2344   UROBILINOGEN 0.2 05/12/2015 1234   UROBILINOGEN 1.0 08/09/2012 1727   NITRITE NEGATIVE 05/12/2018 2344   LEUKOCYTESUR NEGATIVE 05/12/2018 2344   Sepsis Labs: @LABRCNTIP (procalcitonin:4,lacticacidven:4)  )No results found for this or any previous visit (from the past 240 hour(s)).       Radiology Studies: Dg Chest Port 1 View  Result Date: 06/15/2018 CLINICAL DATA:  77 y/o  F; shortness of breath and weakness. EXAM: PORTABLE CHEST 1 VIEW COMPARISON:  05/15/2018 chest radiograph FINDINGS: Stable mild cardiomegaly given projection and technique. Aortic atherosclerosis with calcification. Pleuroparenchymal scarring and emphysema in the lung apices. No focal consolidation. Hiatal hernia. No pleural effusion or pneumothorax. No acute osseous abnormality is evident. IMPRESSION: 1. No acute pulmonary process identified. 2. Stable cardiomegaly, aortic atherosclerosis, and findings of COPD. Electronically Signed   By: Mitzi Hansen M.D.   On: 06/15/2018 23:15        Scheduled  Meds: . apixaban  5 mg Oral BID  . atorvastatin  20 mg Oral q1800  . cholecalciferol  1,000 Units Oral Daily  . diltiazem  120 mg Oral Daily  . insulin aspart  0-5 Units Subcutaneous QHS  . insulin aspart  0-9 Units Subcutaneous TID WC  . metoprolol succinate  50 mg Oral Daily  . metoprolol tartrate  5 mg Intravenous Once  . mometasone-formoterol  2 puff Inhalation BID  . pantoprazole  40 mg Oral Daily  . sodium chloride flush  3 mL Intravenous Q12H  . sodium chloride flush  3 mL Intravenous Q12H   Continuous Infusions: . sodium chloride       LOS: 0 days    Time spent: 25 minutes    Alberteen Sam, MD [facility] 06/16/2018, 5:44 PM     Pager [phone] --- please page though AMION:  [email or web address] If 7PM-7AM, please contact night-coverage

## 2018-06-17 DIAGNOSIS — E1129 Type 2 diabetes mellitus with other diabetic kidney complication: Secondary | ICD-10-CM

## 2018-06-17 LAB — TYPE AND SCREEN
ABO/RH(D): A POS
Antibody Screen: NEGATIVE
UNIT DIVISION: 0
Unit division: 0

## 2018-06-17 LAB — CBC
HCT: 33.4 % — ABNORMAL LOW (ref 36.0–46.0)
HEMOGLOBIN: 10.3 g/dL — AB (ref 12.0–15.0)
MCH: 27.1 pg (ref 26.0–34.0)
MCHC: 30.8 g/dL (ref 30.0–36.0)
MCV: 87.9 fL (ref 78.0–100.0)
PLATELETS: 425 10*3/uL — AB (ref 150–400)
RBC: 3.8 MIL/uL — ABNORMAL LOW (ref 3.87–5.11)
RDW: 16.3 % — ABNORMAL HIGH (ref 11.5–15.5)
WBC: 9.1 10*3/uL (ref 4.0–10.5)

## 2018-06-17 LAB — BASIC METABOLIC PANEL
Anion gap: 9 (ref 5–15)
BUN: 9 mg/dL (ref 6–20)
CHLORIDE: 94 mmol/L — AB (ref 101–111)
CO2: 37 mmol/L — ABNORMAL HIGH (ref 22–32)
Calcium: 9.1 mg/dL (ref 8.9–10.3)
Creatinine, Ser: 1.07 mg/dL — ABNORMAL HIGH (ref 0.44–1.00)
GFR calc Af Amer: 57 mL/min — ABNORMAL LOW (ref >60)
GFR calc non Af Amer: 49 mL/min — ABNORMAL LOW (ref >60)
Glucose, Bld: 90 mg/dL (ref 65–99)
Potassium: 3.4 mmol/L — ABNORMAL LOW (ref 3.5–5.1)
SODIUM: 140 mmol/L (ref 135–145)

## 2018-06-17 LAB — BPAM RBC
Blood Product Expiration Date: 201907092359
Blood Product Expiration Date: 201907092359
ISSUE DATE / TIME: 201906190144
ISSUE DATE / TIME: 201906182151
UNIT TYPE AND RH: 6200
Unit Type and Rh: 6200

## 2018-06-17 LAB — GLUCOSE, CAPILLARY
GLUCOSE-CAPILLARY: 96 mg/dL (ref 65–99)
Glucose-Capillary: 129 mg/dL — ABNORMAL HIGH (ref 65–99)

## 2018-06-17 MED ORDER — METOPROLOL SUCCINATE ER 50 MG PO TB24: 50.0000 mg | ORAL_TABLET | Freq: Every day | ORAL | 3 refills | Status: DC

## 2018-06-17 MED ORDER — DILTIAZEM HCL ER COATED BEADS 120 MG PO CP24: 120.0000 mg | ORAL_CAPSULE | Freq: Every day | ORAL | 3 refills | Status: DC

## 2018-06-17 MED ORDER — DOCUSATE SODIUM 100 MG PO CAPS: 100.0000 mg | ORAL_CAPSULE | Freq: Two times a day (BID) | ORAL | 0 refills | Status: DC

## 2018-06-17 MED ORDER — FERROUS SULFATE 325 (65 FE) MG PO TABS: 325.0000 mg | ORAL_TABLET | Freq: Two times a day (BID) | ORAL | 3 refills | Status: DC

## 2018-06-17 NOTE — Progress Notes (Signed)
Patient discharged from unit to pvt auto to home with family. All personal belongings with patient, All discharge information, meds and follow up appts reviewed with pt.  Stressed importance of daily weights and recording weights to take to MD appts. . Pt voiced she understood. Also stressed importance of keeping all MD appts for close follow up. Pt escorted out of hospital with 02 and resumed 02 with her pvt tank in auto.  Pt demonstrated no distress during discharge process.  Family providing transport.

## 2018-06-17 NOTE — Care Management Note (Signed)
Case Management Note  Patient Details  Name: Dominique Clark MRN: [id number] Date of Birth: 07/21/1941  Subjective/Objective:                    Action/Plan:  [facility]. Paged MD for resumption of care orders.  Patient has home oxygen already and states her son will bring her portable tank when he comes to pick her up. Expected Discharge Date:  06/17/18               Expected Discharge Plan:  Home w Home Health Services  In-House Referral:     Discharge planning Services  CM Consult  Post Acute Care Choice:  Home Health, Resumption of Svcs/PTA Provider Choice offered to:  Patient  DME Arranged:    DME Agency:     HH Arranged:  RN, PT HH Agency:  [facility]  Status of Service:  In process, will continue to follow  If discussed at Long Length of Stay Meetings, dates discussed:    Additional Comments:  Kingsley PlanWile, Alexxander Kurt Marie, RN 06/17/2018, 11:15 AM

## 2018-06-18 ENCOUNTER — Telehealth: Payer: Self-pay | Admitting: *Deleted

## 2018-06-18 DIAGNOSIS — D649 Anemia, unspecified: Secondary | ICD-10-CM | POA: Diagnosis not present

## 2018-06-18 DIAGNOSIS — N183 Chronic kidney disease, stage 3 (moderate): Secondary | ICD-10-CM | POA: Diagnosis not present

## 2018-06-18 DIAGNOSIS — I4891 Unspecified atrial fibrillation: Secondary | ICD-10-CM | POA: Diagnosis not present

## 2018-06-18 DIAGNOSIS — I129 Hypertensive chronic kidney disease with stage 1 through stage 4 chronic kidney disease, or unspecified chronic kidney disease: Secondary | ICD-10-CM | POA: Diagnosis not present

## 2018-06-18 DIAGNOSIS — J441 Chronic obstructive pulmonary disease with (acute) exacerbation: Secondary | ICD-10-CM | POA: Diagnosis not present

## 2018-06-18 DIAGNOSIS — M8580 Other specified disorders of bone density and structure, unspecified site: Secondary | ICD-10-CM | POA: Diagnosis not present

## 2018-06-18 DIAGNOSIS — E1122 Type 2 diabetes mellitus with diabetic chronic kidney disease: Secondary | ICD-10-CM | POA: Diagnosis not present

## 2018-06-18 DIAGNOSIS — K219 Gastro-esophageal reflux disease without esophagitis: Secondary | ICD-10-CM | POA: Diagnosis not present

## 2018-06-18 DIAGNOSIS — J9621 Acute and chronic respiratory failure with hypoxia: Secondary | ICD-10-CM | POA: Diagnosis not present

## 2018-06-18 NOTE — Telephone Encounter (Signed)
Transition Care Management Follow-up Telephone Call   Date discharged? 06/17/2018   How have you been since you were released from the hospital? "much better and getting a lot stronger   Do you understand why you were in the hospital? yes   Do you understand the discharge instructions? yes   Where were you discharged to? home   Items Reviewed:  Medications reviewed: yes; advised to stop prednisone prescribed by Dr Para Marchuncan  Allergies reviewed: yes  Dietary changes reviewed: yes  Referrals reviewed: yes   Functional Questionnaire:   Activities of Daily Living (ADLs):   She states they are independent in the following: independent in all areas  Any transportation issues/concerns?: no; brought to all appts by her son  Any patient concerns? no   Confirmed importance and date/time of follow-up visits scheduled yes  Provider Appointment booked with Dr Para Marchuncan 6/27 @ 1130; changed from 7/1 due to son working   Confirmed with patient if condition begins to worsen call PCP or go to the ER.  Patient was given the office number and encouraged to call back with question or concerns.  : yes

## 2018-06-19 NOTE — Discharge Summary (Signed)
Physician Discharge Summary  Dominique Clark:[id number] DOB: Mar 20, 1941 DOA: 06/15/2018  PCP: [name], MD  Admit date: 06/15/2018 Discharge date: 06/19/2018  Admitted From: Home  Disposition:  Home   Recommendations for Outpatient Follow-up:  1. Follow up with PCP in 1-2 weeks 2. Please obtain CBC and iron studies in 1-2 months 3. Please refer to Gastroenterology re: follow up colonoscopy 4. Please re-evaluate heart rate and titrate metoprolol and new diltiazem CD as needed  Home Health: None  Equipment/Devices: None  Discharge Condition: Good  CODE STATUS: FULL Diet recommendation: Cardiac, diabetic  Brief/Interim Summary: Dominique Clark is a 77 y.o. F with hypertension, COPD, chronic diastolic CHF, atrial fibrillation on Eliquis, and history of diabetes now diet controlled, presenting to the emergency department for evaluation of shortness of breath, fatigue, and low hemoglobin on outpatient blood work. Patient reports the recent insidious development of progressive shortness of breath and fatigue, but denies any significant cough, chest pain, palpitations, fevers, or chills. She was evaluated for these complaints by her PCP and outpatient blood work was obtained. Patient was called,notified of a low hemoglobin, and directed to the ED for further evaluation and management. The patient denies any melena, hematochezia, nominal pain, use of alcohol or NSAIDs.       Symptomatic anemia Slowly progressive, no clinical signs of bleeding.  Transfused 2 units.  Symptoms improved. FOBT negative.  Iron deficient based on ferritin, iron studies.   Appropriate bump post-transfusion.  Started on oral iron.  Needs outpatient referral for endoscopy, discussed with patient.     Atrial fibrillation with RVR CHADS2Vasc >2, on Eliquis. No active bleeding, no melena, no hematochezia.  Heart rate difficult to control.  PRN home diltiazem converted to diltiazem CD 10 daily.  Metoprolol dose  increased and HR normalized.  Asymptomatic.     Acute on chronic diastolic CHF Presented with elevated BNP, dyspnea.  Diuresed with IV Lasix, symptoms resolved.  No orthopnea, dyspnea on exertion more than normal.     Hypertension  COPD  No active disease.  Diabetes Well controlled, HgbA1c 6.1%.     Discharge Diagnoses:  Principal Problem:   Symptomatic anemia Active Problems:   COPD (chronic obstructive pulmonary disease) (HCC)   Essential hypertension   Atrial fibrillation with RVR (HCC)   Type II diabetes mellitus with renal manifestations (HCC)   Normocytic anemia   Acute on chronic diastolic CHF (congestive heart failure) (HCC)   Acute CHF (congestive heart failure) (HCC)    Discharge Instructions  Discharge Instructions    Diet - low sodium heart healthy   Complete by:  As directed    Discharge instructions   Complete by:  As directed    From Dr. Maryfrances Bunnell: You were admitted with trouble breathing and feeling tired which turned out to be from anemia.  We also noticed that your heart rate was very high, which is probably related (sometimes low blood levels can work the heart up to beat extra fast).  For the anemia: You were given 2 units blood transfusion. You should start iron (ferrous sulfate 325 mg) twice daily Take this with reduced sugar orange juice if you can. Also, important, take this with docusate/Colace a stool softener. Have Dr. Para March repeat your blood levels in a few months. Have Dr. Para March refer you to a GI doctor to discuss colonoscopy if you feel necessary.   For your heart rate: We increased your metoprolol. Take metoprolol 50 mg once daily in the morning Take the new  diltiazem CD 120 mg once daily in the evening If you can't get the new diltiazem from the pharmacy immediately, take your old diltiazem three times daily on schedule until you do. Follow up with Dr. Para Marchuncan in 1 week for a heart rate check.   Increase activity slowly    Complete by:  As directed      Allergies as of 06/17/2018      Reactions   Doxycycline Nausea And Vomiting      Medication List    STOP taking these medications   diltiazem 30 MG tablet Commonly known as:  CARDIZEM   predniSONE 10 MG tablet Commonly known as:  DELTASONE     TAKE these medications   albuterol 108 (90 Base) MCG/ACT inhaler Commonly known as:  PROVENTIL HFA;VENTOLIN HFA Inhale 1-2 puffs into the lungs every 4 (four) hours as needed for wheezing. Okay to fill with ventolin or proventil if needed.   apixaban 5 MG Tabs tablet Commonly known as:  ELIQUIS Take 1 tablet (5 mg total) by mouth 2 (two) times daily.   atorvastatin 20 MG tablet Commonly known as:  LIPITOR Take 1 tablet (20 mg total) by mouth daily.   budesonide-formoterol 160-4.5 MCG/ACT inhaler Commonly known as:  SYMBICORT INHALE 2 PUFFS EVERY 12 HOURS TO PREVENTCOUGH OR WHEEZING--RINSE, GARGLE, & SPITAFTER EACH USE. What changed:    how much to take  how to take this  when to take this  additional instructions   cholecalciferol 1000 units tablet Commonly known as:  VITAMIN D Take 1,000 Units by mouth daily.   diltiazem 120 MG 24 hr capsule Commonly known as:  CARDIZEM CD Take 1 capsule (120 mg total) by mouth at bedtime.   docusate sodium 100 MG capsule Commonly known as:  COLACE Take 1 capsule (100 mg total) by mouth 2 (two) times daily.   esomeprazole 20 MG capsule Commonly known as:  NEXIUM Take 20 mg by mouth daily at 12 noon.   ferrous sulfate 325 (65 FE) MG tablet Take 1 tablet (325 mg total) by mouth 2 (two) times daily with a meal.   fluticasone 50 MCG/ACT nasal spray Commonly known as:  FLONASE Place 2 sprays into both nostrils daily as needed (seasonal allergies).   ipratropium-albuterol 0.5-2.5 (3) MG/3ML Soln Commonly known as:  DUONEB Take 3 mLs by nebulization every 4 (four) hours as needed. Dx:J44.9   metoprolol succinate 50 MG 24 hr tablet Commonly known  as:  TOPROL-XL Take 1 tablet (50 mg total) by mouth daily. Take with or immediately following a meal. What changed:    medication strength  how much to take  additional instructions   NON FORMULARY Oxygen 3 liters 24/7   traZODone 50 MG tablet Commonly known as:  DESYREL Take 0.5-1 tablets (25-50 mg total) by mouth at bedtime as needed for sleep.      Follow-up Information    Joaquim Namuncan, Graham S, MD On 06/17/2018.   Specialty:  Family Medicine Contact information: [address] [place] [address] [phone]          Allergies  Allergen Reactions  . Doxycycline Nausea And Vomiting    Consultations:  None   Procedures/Studies: Dg Chest Port 1 View  Result Date: 06/15/2018 CLINICAL DATA:  77 y/o  F; shortness of breath and weakness. EXAM: PORTABLE CHEST 1 VIEW COMPARISON:  05/15/2018 chest radiograph FINDINGS: Stable mild cardiomegaly given projection and technique. Aortic atherosclerosis with calcification. Pleuroparenchymal scarring and emphysema in the lung apices.  No focal consolidation. Hiatal hernia. No pleural effusion or pneumothorax. No acute osseous abnormality is evident. IMPRESSION: 1. No acute pulmonary process identified. 2. Stable cardiomegaly, aortic atherosclerosis, and findings of COPD. Electronically Signed   By: Mitzi Hansen M.D.   On: 06/15/2018 23:15       Subjective: Feels well.  No more dyspnea on exertion than usual.  No orthopnea.  No palpitations.  No chest pain.  No fever, cough, sputum  Discharge Exam: Vitals:   06/17/18 1002 06/17/18 1117  BP:  (!) 92/53  Pulse:  62  Resp:  20  Temp:  97.9 F (36.6 C)  SpO2: 94% 96%   Vitals:   06/17/18 0811 06/17/18 0829 06/17/18 1002 06/17/18 1117  BP: 103/62 114/61  (!) 92/53  Pulse: (!) 108 76  62  Resp: 18   20  Temp: 97.9 F (36.6 C)   97.9 F (36.6 C)  TempSrc: Oral   Oral  SpO2: 96% 90% 94% 96%  Weight:      Height:        General: Pt is alert, awake,  not in acute distress Cardiovascular: Irregular rhythm, regular rate, S1/S2 +, no rubs, no gallops Respiratory: CTA bilaterally, no wheezing, no rhonchi Abdominal: Soft, NT, ND, bowel sounds + Extremities: no edema, no cyanosis    The results of significant diagnostics from this hospitalization (including imaging, microbiology, ancillary and laboratory) are listed below for reference.     Microbiology: No results found for this or any previous visit (from the past 240 hour(s)).   Labs: BNP (last 3 results) Recent Labs    05/13/18 0211 05/15/18 0334 06/15/18 2214  BNP 172.2* 225.9* 242.3*   Basic Metabolic Panel: Recent Labs  Lab 06/15/18 1144 06/15/18 1851 06/16/18 0102 06/17/18 0439  NA 138 138 139 140  K 4.2 4.9 4.5 3.4*  CL 100 100* 99* 94*  CO2 33* 28 33* 37*  GLUCOSE 102* 263* 117* 90  BUN 12 11 11 9   CREATININE 0.84 1.00 0.92 1.07*  CALCIUM 9.0 8.9 9.1 9.1   Liver Function Tests: Recent Labs  Lab 06/15/18 1144 06/15/18 1851  AST 19 26  ALT 13 16  ALKPHOS 64 64  BILITOT 0.3 0.3  PROT 6.3 6.4*  ALBUMIN 3.3* 3.0*   No results for input(s): LIPASE, AMYLASE in the last 168 hours. No results for input(s): AMMONIA in the last 168 hours. CBC: Recent Labs  Lab 06/15/18 1144 06/15/18 1851 06/16/18 1057 06/17/18 0439  WBC 9.2 8.1 10.3 9.1  NEUTROABS 6.8  --   --   --   HGB 7.3 Repeated and verified X2.* 7.3* 10.1* 10.3*  HCT 23.1 Repeated and verified X2.* 26.2* 32.6* 33.4*  MCV 84.4 91.0 87.6 87.9  PLT 502.0* 492* 438* 425*   Cardiac Enzymes: No results for input(s): CKTOTAL, CKMB, CKMBINDEX, TROPONINI in the last 168 hours. BNP: Invalid input(s): POCBNP CBG: Recent Labs  Lab 06/16/18 1146 06/16/18 1638 06/16/18 2154 06/17/18 0808 06/17/18 1105  GLUCAP 99 103* 114* 96 129*   D-Dimer No results for input(s): DDIMER in the last 72 hours. Hgb A1c No results for input(s): HGBA1C in the last 72 hours. Lipid Profile No results for input(s):  CHOL, HDL, LDLCALC, TRIG, CHOLHDL, LDLDIRECT in the last 72 hours. Thyroid function studies No results for input(s): TSH, T4TOTAL, T3FREE, THYROIDAB in the last 72 hours.  Invalid input(s): FREET3 Anemia work up No results for input(s): VITAMINB12, FOLATE, FERRITIN, TIBC, IRON, RETICCTPCT in the last 72 hours.  Urinalysis    Component Value Date/Time   COLORURINE STRAW (A) 05/12/2018 2344   APPEARANCEUR CLEAR 05/12/2018 2344   LABSPEC 1.006 05/12/2018 2344   PHURINE 6.0 05/12/2018 2344   GLUCOSEU 150 (A) 05/12/2018 2344   HGBUR NEGATIVE 05/12/2018 2344   HGBUR negative 10/11/2008 0926   BILIRUBINUR NEGATIVE 05/12/2018 2344   BILIRUBINUR Neg 05/12/2015 1234   KETONESUR NEGATIVE 05/12/2018 2344   PROTEINUR NEGATIVE 05/12/2018 2344   UROBILINOGEN 0.2 05/12/2015 1234   UROBILINOGEN 1.0 08/09/2012 1727   NITRITE NEGATIVE 05/12/2018 2344   LEUKOCYTESUR NEGATIVE 05/12/2018 2344   Sepsis Labs Invalid input(s): PROCALCITONIN,  WBC,  LACTICIDVEN Microbiology No results found for this or any previous visit (from the past 240 hour(s)).   Time coordinating discharge:  40 minutes       SIGNED:   Alberteen Sam, MD  [facility] 06/17/2018, 10:42 AM

## 2018-06-21 ENCOUNTER — Telehealth: Payer: Self-pay | Admitting: Family Medicine

## 2018-06-21 NOTE — Telephone Encounter (Signed)
Copied from CRM [phone]. Topic: Quick Communication - See Telephone Encounter >> Jun 21, 2018  9:30 AM Burchel, Abbi R wrote: See Telephone encounter for: 06/21/18.   Lupita LeashDonna from [facility] [phone]  V/O to resume care as follows: PT & OT and Nursing for 1*1wk 2*2wk  2*3wk and  2*PRN

## 2018-06-21 NOTE — Telephone Encounter (Signed)
Agree with this. Thanks.  

## 2018-06-21 NOTE — Telephone Encounter (Signed)
[facility].

## 2018-06-24 ENCOUNTER — Encounter: Payer: Self-pay | Admitting: Family Medicine

## 2018-06-24 ENCOUNTER — Telehealth: Payer: Self-pay | Admitting: Family Medicine

## 2018-06-24 ENCOUNTER — Ambulatory Visit (INDEPENDENT_AMBULATORY_CARE_PROVIDER_SITE_OTHER): Payer: Medicare HMO | Admitting: Family Medicine

## 2018-06-24 VITALS — BP 114/70 | HR 72 | Temp 98.2°F | Ht 62.5 in

## 2018-06-24 DIAGNOSIS — R0602 Shortness of breath: Secondary | ICD-10-CM | POA: Diagnosis not present

## 2018-06-24 DIAGNOSIS — E1129 Type 2 diabetes mellitus with other diabetic kidney complication: Secondary | ICD-10-CM

## 2018-06-24 DIAGNOSIS — I4891 Unspecified atrial fibrillation: Secondary | ICD-10-CM | POA: Diagnosis not present

## 2018-06-24 DIAGNOSIS — D509 Iron deficiency anemia, unspecified: Secondary | ICD-10-CM

## 2018-06-24 LAB — CBC WITH DIFFERENTIAL/PLATELET
BASOS ABS: 0.1 10*3/uL (ref 0.0–0.1)
Basophils Relative: 0.8 % (ref 0.0–3.0)
EOS ABS: 0 10*3/uL (ref 0.0–0.7)
Eosinophils Relative: 0.3 % (ref 0.0–5.0)
HCT: 35.4 % — ABNORMAL LOW (ref 36.0–46.0)
Hemoglobin: 11.2 g/dL — ABNORMAL LOW (ref 12.0–15.0)
LYMPHS ABS: 1.2 10*3/uL (ref 0.7–4.0)
LYMPHS PCT: 8.1 % — AB (ref 12.0–46.0)
MCHC: 31.7 g/dL (ref 30.0–36.0)
MCV: 87.8 fl (ref 78.0–100.0)
MONO ABS: 0.9 10*3/uL (ref 0.1–1.0)
Monocytes Relative: 6.5 % (ref 3.0–12.0)
NEUTROS ABS: 12.1 10*3/uL — AB (ref 1.4–7.7)
NEUTROS PCT: 84.3 % — AB (ref 43.0–77.0)
PLATELETS: 306 10*3/uL (ref 150.0–400.0)
RBC: 4.03 Mil/uL (ref 3.87–5.11)
RDW: 19.2 % — ABNORMAL HIGH (ref 11.5–15.5)
WBC: 14.3 10*3/uL — ABNORMAL HIGH (ref 4.0–10.5)

## 2018-06-24 LAB — BASIC METABOLIC PANEL
BUN: 13 mg/dL (ref 6–23)
CO2: 32 meq/L (ref 19–32)
CREATININE: 1.07 mg/dL (ref 0.40–1.20)
Calcium: 9.6 mg/dL (ref 8.4–10.5)
Chloride: 101 mEq/L (ref 96–112)
GFR: 52.73 mL/min — ABNORMAL LOW (ref >60.00)
GLUCOSE: 122 mg/dL — AB (ref 70–99)
Potassium: 4.9 mEq/L (ref 3.5–5.1)
Sodium: 138 mEq/L (ref 135–145)

## 2018-06-24 MED ORDER — METOPROLOL SUCCINATE ER 50 MG PO TB24: 50.0000 mg | ORAL_TABLET | Freq: Every day | ORAL | 3 refills | Status: DC

## 2018-06-24 MED ORDER — DILTIAZEM HCL ER COATED BEADS 120 MG PO CP24: 120.0000 mg | ORAL_CAPSULE | Freq: Every day | ORAL | 3 refills | Status: DC

## 2018-06-24 MED ORDER — DOCUSATE SODIUM 100 MG PO CAPS: 100.0000 mg | ORAL_CAPSULE | Freq: Two times a day (BID) | ORAL | 3 refills | Status: DC

## 2018-06-24 MED ORDER — FERROUS SULFATE 325 (65 FE) MG PO TABS: 325.0000 mg | ORAL_TABLET | Freq: Two times a day (BID) | ORAL | 3 refills | Status: DC

## 2018-06-24 NOTE — Progress Notes (Signed)
Admit date: 06/15/2018 Discharge date: 06/19/2018  Admitted From: Home  Disposition:  Home   Recommendations for Outpatient Follow-up:  1. Follow up with PCP in 1-2 weeks 2. Please obtain CBC and iron studies in 1-2 months 3. Please refer to Gastroenterology re: follow up colonoscopy 4. Please re-evaluate heart rate and titrate metoprolol and new diltiazem CD as needed  Home Health: None  Equipment/Devices: None  Discharge Condition: Good  CODE STATUS: FULL Diet recommendation: Cardiac, diabetic  Brief/Interim Summary: Dominique Clark a 77 y.o.Fwith hypertension, COPD, chronic diastolic CHF, atrial fibrillation on Eliquis, and history of diabetes now diet controlled, presenting to the emergency department for evaluation of shortness of breath, fatigue, and low hemoglobin on outpatient blood work. Patient reports the recent insidious development of progressive shortness of breath and fatigue, but denies any significant cough, chest pain, palpitations, fevers, or chills. She was evaluated for these complaints by her PCP and outpatient blood work was obtained. Patient was called,notified of a low hemoglobin, and directed to the ED for further evaluation and management. The patient denies any melena, hematochezia, nominal pain, use of alcohol or NSAIDs.       Symptomatic anemia Slowly progressive, no clinical signs of bleeding.  Transfused 2 units.Symptoms improved. FOBT negative. Iron deficient based on ferritin, iron studies. Appropriate bump post-transfusion.  Started on oral iron.  Needs outpatient referral for endoscopy, discussed with patient.     Atrial fibrillation with RVR CHADS2Vasc >2, on Eliquis. No active bleeding, no melena, no hematochezia.  Heart rate difficult to control.  PRN home diltiazem converted to diltiazem CD 10 daily.  Metoprolol dose increased and HR normalized.  Asymptomatic.     Acute on chronic diastolic CHF Presented with elevated  BNP, dyspnea.  Diuresed with IV Lasix, symptoms resolved.  No orthopnea, dyspnea on exertion more than normal.     Hypertension  COPD  No active disease.  Diabetes Well controlled, HgbA1c 6.1%.     Discharge Diagnoses:  Principal Problem:   Symptomatic anemia Active Problems:   COPD (chronic obstructive pulmonary disease) (HCC)   Essential hypertension   Atrial fibrillation with RVR (HCC)   Type II diabetes mellitus with renal manifestations (HCC)   Normocytic anemia   Acute on chronic diastolic CHF (congestive heart failure) (HCC)   Acute CHF (congestive heart failure) (HCC)    Discharge Instructions      Discharge Instructions    Diet - low sodium heart healthy   Complete by:  As directed    Discharge instructions   Complete by:  As directed    From Dr. Maryfrances Bunnell: You were admitted with trouble breathing and feeling tired which turned out to be from anemia.  We also noticed that your heart rate was very high, which is probably related (sometimes low blood levels can work the heart up to beat extra fast).  For the anemia: You were given 2 units blood transfusion. You should start iron (ferrous sulfate 325 mg) twice daily Take this with reduced sugar orange juice if you can. Also, important, take this with docusate/Colace a stool softener. Have Dr. Para March repeat your blood levels in a few months. Have Dr. Para March refer you to a GI doctor to discuss colonoscopy if you feel necessary.   For your heart rate: We increased your metoprolol. Take metoprolol 50 mg once daily in the morning Take the new diltiazem CD 120 mg once daily in the evening If you can't get the new diltiazem from the pharmacy immediately, take your old  diltiazem three times daily on schedule until you do. Follow up with Dr. Para March in 1 week for a heart rate check.   Increase activity slowly   Complete by:  As directed           Allergies as of 06/17/2018      Reactions    Doxycycline Nausea And Vomiting         Medication List    STOP taking these medications   diltiazem 30 MG tablet Commonly known as:  CARDIZEM   predniSONE 10 MG tablet Commonly known as:  DELTASONE     TAKE these medications   albuterol 108 (90 Base) MCG/ACT inhaler Commonly known as:  PROVENTIL HFA;VENTOLIN HFA Inhale 1-2 puffs into the lungs every 4 (four) hours as needed for wheezing. Okay to fill with ventolin or proventil if needed.   apixaban 5 MG Tabs tablet Commonly known as:  ELIQUIS Take 1 tablet (5 mg total) by mouth 2 (two) times daily.   atorvastatin 20 MG tablet Commonly known as:  LIPITOR Take 1 tablet (20 mg total) by mouth daily.   budesonide-formoterol 160-4.5 MCG/ACT inhaler Commonly known as:  SYMBICORT INHALE 2 PUFFS EVERY 12 HOURS TO PREVENTCOUGH OR WHEEZING--RINSE, GARGLE, & SPITAFTER EACH USE. What changed:    how much to take  how to take this  when to take this  additional instructions   cholecalciferol 1000 units tablet Commonly known as:  VITAMIN D Take 1,000 Units by mouth daily.   diltiazem 120 MG 24 hr capsule Commonly known as:  CARDIZEM CD Take 1 capsule (120 mg total) by mouth at bedtime.   docusate sodium 100 MG capsule Commonly known as:  COLACE Take 1 capsule (100 mg total) by mouth 2 (two) times daily.   esomeprazole 20 MG capsule Commonly known as:  NEXIUM Take 20 mg by mouth daily at 12 noon.   ferrous sulfate 325 (65 FE) MG tablet Take 1 tablet (325 mg total) by mouth 2 (two) times daily with a meal.   fluticasone 50 MCG/ACT nasal spray Commonly known as:  FLONASE Place 2 sprays into both nostrils daily as needed (seasonal allergies).   ipratropium-albuterol 0.5-2.5 (3) MG/3ML Soln Commonly known as:  DUONEB Take 3 mLs by nebulization every 4 (four) hours as needed. Dx:J44.9   metoprolol succinate 50 MG 24 hr tablet Commonly known as:  TOPROL-XL Take 1 tablet (50 mg total) by mouth daily.  Take with or immediately following a meal. What changed:    medication strength  how much to take  additional instructions   NON FORMULARY Oxygen 3 liters 24/7   traZODone 50 MG tablet Commonly known as:  DESYREL Take 0.5-1 tablets (25-50 mg total) by mouth at bedtime as needed for sleep.         Follow-up Information    [name], MD On 06/17/2018.   Specialty:  Family Medicine Contact information: [address] [place] [address] [phone]              Allergies  Allergen Reactions  . Doxycycline Nausea And Vomiting   ============================== Inpatient f/u.  Above d/w pt.  Admitted with anemia.  Transfused.  No ADE with transfusion, she felt better.  No known bleeding source.  No bleeding in the interval.  Still on anticoagulation.  Still on iron.    She is on 2L at OV, using 2-3 L at home.  Her SOB is better now.  White sputum at baseline.  No fevers.    AF hx noted, still on rate control meds with dose change noted.  No ADE on med.  Compliant.   PMH and SH reviewed  ROS: Per HPI unless specifically indicated in ROS section   Meds, vitals, and allergies reviewed.   GEN: nad, alert and oriented HEENT: mucous membranes moist NECK: supple w/o LA CV: IRR, not tachy PULM: occ scattered rhonchi but o/w ctab, no inc wob ABD: soft, +bs EXT: trace BLE edema SKIN: no acute rash

## 2018-06-24 NOTE — Telephone Encounter (Signed)
Please give the order, I'll defer if patient declines the order but at least offer.  Thanks.

## 2018-06-24 NOTE — Telephone Encounter (Signed)
Copied from CRM [id number]. Topic: Quick Communication - See Telephone Encounter >> Jun 21, 2018  9:30 AM Burchel, Abbi R wrote: See Telephone encounter for: 06/21/18.   Lupita LeashDonna from [facility] [phone])  V/O to resume care as follows: PT & OT and Nursing for 1*1wk 2*2wk  2*3wk and  2*PRN >> Jun 24, 2018  3:03 PM Beryle Beamsawoud, Joyce CopaJessica L wrote: [facility] and stated that pt declined OT evaluation.

## 2018-06-24 NOTE — Patient Instructions (Addendum)
We will call about your referral.  Shirlee LimerickMarion or Alvina Chounastasiya will call you if you don't see one of them on the way out.  Go to the lab on the way out.  We'll contact you with your lab report. If your breathing gets worse, if you have discolored sputum, if the swelling gets worse, then let me know.  Update me as needed.  Take care.  Glad to see you.

## 2018-06-25 ENCOUNTER — Encounter: Payer: Self-pay | Admitting: Family Medicine

## 2018-06-25 DIAGNOSIS — K219 Gastro-esophageal reflux disease without esophagitis: Secondary | ICD-10-CM | POA: Diagnosis not present

## 2018-06-25 DIAGNOSIS — M8580 Other specified disorders of bone density and structure, unspecified site: Secondary | ICD-10-CM | POA: Diagnosis not present

## 2018-06-25 DIAGNOSIS — J441 Chronic obstructive pulmonary disease with (acute) exacerbation: Secondary | ICD-10-CM | POA: Diagnosis not present

## 2018-06-25 DIAGNOSIS — E1122 Type 2 diabetes mellitus with diabetic chronic kidney disease: Secondary | ICD-10-CM | POA: Diagnosis not present

## 2018-06-25 DIAGNOSIS — I129 Hypertensive chronic kidney disease with stage 1 through stage 4 chronic kidney disease, or unspecified chronic kidney disease: Secondary | ICD-10-CM | POA: Diagnosis not present

## 2018-06-25 DIAGNOSIS — D509 Iron deficiency anemia, unspecified: Secondary | ICD-10-CM | POA: Insufficient documentation

## 2018-06-25 DIAGNOSIS — I4891 Unspecified atrial fibrillation: Secondary | ICD-10-CM | POA: Diagnosis not present

## 2018-06-25 DIAGNOSIS — D649 Anemia, unspecified: Secondary | ICD-10-CM | POA: Diagnosis not present

## 2018-06-25 DIAGNOSIS — J9621 Acute and chronic respiratory failure with hypoxia: Secondary | ICD-10-CM | POA: Diagnosis not present

## 2018-06-25 DIAGNOSIS — N183 Chronic kidney disease, stage 3 (moderate): Secondary | ICD-10-CM | POA: Diagnosis not present

## 2018-06-25 NOTE — Telephone Encounter (Signed)
This was done by Rene Kocheregina last week from Dr. Sharen HonesGutierrez.

## 2018-06-25 NOTE — Assessment & Plan Note (Signed)
Refer to GI. Would continue current meds as is.  She agrees.  See notes on f/u labs.  Okay for outpatient f/u.

## 2018-06-25 NOTE — Assessment & Plan Note (Signed)
Would continue current meds as is.  She agrees.  See notes on f/u labs.

## 2018-06-25 NOTE — Telephone Encounter (Signed)
Thanks

## 2018-06-25 NOTE — Assessment & Plan Note (Signed)
Improved, likely multifactorial, would continue current meds as is.  She agrees.  See notes on f/u labs.

## 2018-06-25 NOTE — Assessment & Plan Note (Signed)
Controlled by A1c, no change in meds.

## 2018-06-26 DIAGNOSIS — D649 Anemia, unspecified: Secondary | ICD-10-CM | POA: Diagnosis not present

## 2018-06-26 DIAGNOSIS — E1122 Type 2 diabetes mellitus with diabetic chronic kidney disease: Secondary | ICD-10-CM | POA: Diagnosis not present

## 2018-06-26 DIAGNOSIS — J441 Chronic obstructive pulmonary disease with (acute) exacerbation: Secondary | ICD-10-CM | POA: Diagnosis not present

## 2018-06-26 DIAGNOSIS — I4891 Unspecified atrial fibrillation: Secondary | ICD-10-CM | POA: Diagnosis not present

## 2018-06-26 DIAGNOSIS — I129 Hypertensive chronic kidney disease with stage 1 through stage 4 chronic kidney disease, or unspecified chronic kidney disease: Secondary | ICD-10-CM

## 2018-06-28 ENCOUNTER — Ambulatory Visit: Payer: Medicare HMO | Admitting: Gastroenterology

## 2018-06-28 ENCOUNTER — Encounter: Payer: Self-pay | Admitting: Gastroenterology

## 2018-06-28 ENCOUNTER — Inpatient Hospital Stay: Payer: Medicare HMO | Admitting: Family Medicine

## 2018-06-28 ENCOUNTER — Telehealth: Payer: Self-pay | Admitting: Family Medicine

## 2018-06-28 VITALS — BP 100/60 | HR 64 | Ht 62.5 in | Wt 135.8 lb

## 2018-06-28 DIAGNOSIS — N183 Chronic kidney disease, stage 3 (moderate): Secondary | ICD-10-CM | POA: Diagnosis not present

## 2018-06-28 DIAGNOSIS — Z7901 Long term (current) use of anticoagulants: Secondary | ICD-10-CM

## 2018-06-28 DIAGNOSIS — E1122 Type 2 diabetes mellitus with diabetic chronic kidney disease: Secondary | ICD-10-CM | POA: Diagnosis not present

## 2018-06-28 DIAGNOSIS — D649 Anemia, unspecified: Secondary | ICD-10-CM | POA: Diagnosis not present

## 2018-06-28 DIAGNOSIS — J9621 Acute and chronic respiratory failure with hypoxia: Secondary | ICD-10-CM | POA: Diagnosis not present

## 2018-06-28 DIAGNOSIS — K219 Gastro-esophageal reflux disease without esophagitis: Secondary | ICD-10-CM | POA: Diagnosis not present

## 2018-06-28 DIAGNOSIS — D509 Iron deficiency anemia, unspecified: Secondary | ICD-10-CM

## 2018-06-28 DIAGNOSIS — I129 Hypertensive chronic kidney disease with stage 1 through stage 4 chronic kidney disease, or unspecified chronic kidney disease: Secondary | ICD-10-CM | POA: Diagnosis not present

## 2018-06-28 DIAGNOSIS — J441 Chronic obstructive pulmonary disease with (acute) exacerbation: Secondary | ICD-10-CM | POA: Diagnosis not present

## 2018-06-28 DIAGNOSIS — I4891 Unspecified atrial fibrillation: Secondary | ICD-10-CM | POA: Diagnosis not present

## 2018-06-28 DIAGNOSIS — J449 Chronic obstructive pulmonary disease, unspecified: Secondary | ICD-10-CM

## 2018-06-28 DIAGNOSIS — M8580 Other specified disorders of bone density and structure, unspecified site: Secondary | ICD-10-CM | POA: Diagnosis not present

## 2018-06-28 NOTE — Telephone Encounter (Signed)
Please give the order.  Thanks.   

## 2018-06-28 NOTE — Telephone Encounter (Signed)
Copied from CRM [id number]. Topic: General - Other >> Jun 28, 2018  4:13 PM Mcneil, Ja-Kwan wrote: Reason for CRM: Loura HaltStacy David with [facility] request verbal orders for physical therapy 2 times a week 2 weeks and 1 time a week for 2 weeks. Cb# [phone]

## 2018-06-28 NOTE — Patient Instructions (Signed)
If you are age 78 or older, your body mass index should be between 23-30. Your Body mass index is 24.44 kg/m. If this is out of the aforementioned range listed, please consider follow up with your Primary Care Provider.  If you are age 78 or younger, your body mass index should be between 19-25. Your Body mass index is 24.44 kg/m. If this is out of the aformentioned range listed, please consider follow up with your Primary Care Provider.   It has been recommended to you by your physician that you have a(n) Colonoscopy completed. Per your request, we did not schedule the procedure(s) today. Please contact our office at [phone] should you decide to have the procedure completed.   It was a pleasure to meet you today!  Dr. Myrtie Neitheranis

## 2018-06-28 NOTE — Progress Notes (Signed)
[facility] Consult Note:  History: Dominique Clark 06/28/2018  Referring physician: [name], MD  Reason for consult/chief complaint: Anemia (Iron def., RUQ pain, hiatal hernia, ) and Colonoscopy (Patient  request ENDO also)   Subjective  HPI:  This is a very pleasant 77 year old woman referred by Dr. Para March primary care for new onset iron deficiency anemia.  It seems to have been discovered about 2 months ago when she was admitted for COPD exacerbation.  Her hemoglobin was mildly decreased late last year, around the time she started oral anticoagulation for A. fib.  Her anemia was more severe with a hemoglobin of 7.3 on an admission about 2 weeks ago where she was also in rapid A. fib.  GI was not consulted during that admission, stool was heme negative, and she was referred for outpatient management.  She received PRBC transfusion and has been on oral iron since then. Tom underwent upper endoscopy with Dr. Arlyce Dice in February 2015, which discovered 9 cm hiatal hernia and a stricture treated with bougie dilation.  She went directly for an upper GI series after that procedure because a mucosal tear was seen from the dilation, but there was no perforation. Her last colonoscopy was in 2004. Her bowel movements are regular, she has seen no black or bloody stool, denies nausea, vomiting, dysphagia or weight loss.  She is on continuous oxygen for her COPD.  ROS:  Review of Systems  Constitutional: Positive for fatigue. Negative for appetite change and unexpected weight change.  HENT: Negative for mouth sores and voice change.   Eyes: Negative for pain and redness.  Respiratory: Positive for cough and shortness of breath.   Cardiovascular: Negative for chest pain and palpitations.  Genitourinary: Negative for dysuria and hematuria.  Musculoskeletal: Negative for arthralgias and myalgias.  Skin: Negative for pallor and rash.  Neurological: Negative for weakness  and headaches.  Hematological: Negative for adenopathy.     Past Medical History: Past Medical History:  Diagnosis Date  . AF (atrial fibrillation) (HCC)   . Asthma   . COPD (chronic obstructive pulmonary disease) (HCC)   . Diabetes mellitus    type 2  . Diverticulosis    s/p colonoscopy  . Gastritis    via EGD  . Hiatal hernia    s/p dilation 2004  . Osteopenia   . Stress incontinence, female   . TIA (transient ischemic attack)      Past Surgical History: Past Surgical History:  Procedure Laterality Date  . BLADDER REPAIR  9/04   bladder tack   . BREAST CYST EXCISION Right 1972  . TONSILLECTOMY     age 72  . TOTAL ABDOMINAL HYSTERECTOMY       Family History: Family History  Problem Relation Age of Onset  . Stroke Mother   . Breast cancer Neg Hx   . Colon cancer Neg Hx     Social History: Social History   Socioeconomic History  . Marital status: Single    Spouse name: Not on file  . Number of children: 2  . Years of education: Not on file  . Highest education level: Not on file  Occupational History  . Occupation: [employer]     Employer: RETIRED    Comment: [employer]  Social Needs  . Financial resource strain: Not on file  . Food insecurity:    Worry: Not on file    Inability: Not on file  . Transportation needs:    Medical: Not  on file    Non-medical: Not on file  Tobacco Use  . Smoking status: Former Smoker    Types: Cigarettes    Last attempt to quit: 12/29/1988    Years since quitting: 29.5  . Smokeless tobacco: Never Used  . Tobacco comment: 20 or more years   Substance and Sexual Activity  . Alcohol use: No    Alcohol/week: 0.0 oz  . Drug use: No  . Sexual activity: Never  Lifestyle  . Physical activity:    Days per week: Not on file    Minutes per session: Not on file  . Stress: Not on file  Relationships  . Social connections:    Talks on phone: Not on file    Gets together: Not on file    Attends religious service: Not on  file    Active member of club or organization: Not on file    Attends meetings of clubs or organizations: Not on file    Relationship status: Not on file  Other Topics Concern  . Not on file  Social History Narrative   Marital Status: divorced   Children: 2 children, 1 grandchild   Occupation: retired from [employer] and [employer]    Allergies: Allergies  Allergen Reactions  . Doxycycline Nausea And Vomiting    Outpatient Meds: Current Outpatient Medications  Medication Sig Dispense Refill  . albuterol (PROVENTIL HFA;VENTOLIN HFA) 108 (90 Base) MCG/ACT inhaler Inhale 1-2 puffs into the lungs every 4 (four) hours as needed for wheezing. Okay to fill with ventolin or proventil if needed. 3 Inhaler 3  . apixaban (ELIQUIS) 5 MG TABS tablet Take 1 tablet (5 mg total) by mouth 2 (two) times daily. 180 tablet 3  . atorvastatin (LIPITOR) 20 MG tablet Take 1 tablet (20 mg total) by mouth daily. 90 tablet 3  . budesonide-formoterol (SYMBICORT) 160-4.5 MCG/ACT inhaler INHALE 2 PUFFS EVERY 12 HOURS TO PREVENTCOUGH OR WHEEZING--RINSE, GARGLE, & SPITAFTER EACH USE. (Patient taking differently: Inhale 2 puffs into the lungs every 12 (twelve) hours. TO PREVENT COUGH OR WHEEZING--RINSE, GARGLE, & SPIT AFTER EACH USE.) 3 Inhaler 3  . cholecalciferol (VITAMIN D) 1000 units tablet Take 1,000 Units by mouth daily.    Marland Kitchen diltiazem (CARDIZEM CD) 120 MG 24 hr capsule Take 1 capsule (120 mg total) by mouth at bedtime. 90 capsule 3  . docusate sodium (COLACE) 100 MG capsule Take 1 capsule (100 mg total) by mouth 2 (two) times daily. 100 capsule 3  . esomeprazole (NEXIUM) 20 MG capsule Take 20 mg by mouth daily at 12 noon.    . ferrous sulfate 325 (65 FE) MG tablet Take 1 tablet (325 mg total) by mouth 2 (two) times daily with a meal. 180 tablet 3  . fluticasone (FLONASE) 50 MCG/ACT nasal spray Place 2 sprays into both nostrils daily as needed (seasonal allergies).    Marland Kitchen  ipratropium-albuterol (DUONEB) 0.5-2.5 (3) MG/3ML SOLN Take 3 mLs by nebulization every 4 (four) hours as needed. Dx:J44.9 360 mL 1  . metoprolol succinate (TOPROL-XL) 50 MG 24 hr tablet Take 1 tablet (50 mg total) by mouth daily. Take with or immediately following a meal. 90 tablet 3  . NON FORMULARY Oxygen 3 liters 24/7    . traZODone (DESYREL) 50 MG tablet Take 0.5-1 tablets (25-50 mg total) by mouth at bedtime as needed for sleep. 30 tablet 3   No current facility-administered medications for this visit.       ___________________________________________________________________  Objective   Exam:  BP 100/60   Pulse 64   Ht 5' 2.5" (1.588 m)   Wt 135 lb 12.8 oz (61.6 kg)   BMI 24.44 kg/m    General: this is a(n) chronically ill-appearing woman, wearing oxygen, able to get on exam table from wheelchair with assistance.  She is breathing comfortably and able to speak full sentences.  Eyes: sclera anicteric, no redness  ENT: oral mucosa moist without lesions, no cervical or supraclavicular lymphadenopathy, good dentition  CV: RRR without murmur, S1/S2, no JVD, edema to mid thigh bilaterally  Resp: clear to auscultation bilaterally, normal RR and effort noted  GI: soft, no tenderness, with active bowel sounds. No guarding or palpable organomegaly noted.  Skin; warm and dry, no rash or jaundice noted  Neuro: awake, alert and oriented x 3. Normal gross motor function and fluent speech  Labs:  CBC Latest Ref Rng & Units 06/24/2018 06/17/2018 06/16/2018  WBC 4.0 - 10.5 K/uL 14.3(H) 9.1 10.3  Hemoglobin 12.0 - 15.0 g/dL 11.2(L) 10.3(L) 10.1(L)  Hematocrit 36.0 - 46.0 % 35.4(L) 33.4(L) 32.6(L)  Platelets 150.0 - 400.0 K/uL 306.0 425(H) 438(H)   05/19/18:  Hgb 9.6 06/15/18  WBC 8.1,  Hgb 7.3  MCV 91  RDW 17,  plt 492 FOBT neg  06/15/18:  Iron 10, TIBC 332 3% saturation, ferritin 8 Radiologic Studies:  Echo 11/2017:  LVEF 55%, diastolic dysfunction, PA pressure  38  Assessment: Encounter Diagnoses  Name Primary?  . Iron deficiency anemia, unspecified iron deficiency anemia type Yes  . Chronic obstructive pulmonary disease, unspecified COPD type (HCC)   . Atrial fibrillation, unspecified type (HCC)   . Current use of long term anticoagulation     Although she was reportedly heme negative, the degree of iron deficiency anemia suggest there may be a source of occult GI blood loss.  As such, I advised her to have upper endoscopy and colonoscopy.  It must be done in the hospital endoscopy lab due to to the severity of her COPD.  She was offered my next available outpatient hospital endoscopy slot in early August, but declined since it was the day after her birthday.  She requested we contact her when we have the future hospital outpatient endoscopy date set.  In the meantime, she will remain on oral iron, and I will ask her primary care provider to follow her blood counts and iron levels.  The benefits and risks of the planned procedure were described in detail with the patient or (when appropriate) their health care proxy.  Risks were outlined as including, but not limited to, bleeding, infection, perforation, adverse medication reaction leading to cardiac or pulmonary decompensation, or pancreatitis (if ERCP).  The limitation of incomplete mucosal visualization was also discussed.  No guarantees or warranties were given.  Patient at increased risk for cardiopulmonary complications of procedure due to medical comorbidities.    Thank you for the courtesy of this consult.  Please call me with any questions or concerns.  Charlie PitterHenry L Danis III  CC: Joaquim Namuncan, Graham S, MD

## 2018-06-28 NOTE — Telephone Encounter (Signed)
[facility] Kennyth ArnoldStacy stated that she wanted to let Dr.Duncan know that she noticed lower extremely swelling left more than right with a Villalva redness on left.Kennyth Arnold. Stacy stated that she will continue to monitor it. Kennyth ArnoldStacy stated that when patient is sitting she is not elevating her legs. Kennyth ArnoldStacy stated that she educated her on this and told her to elevate her legs when sitting.

## 2018-06-29 ENCOUNTER — Encounter: Payer: Self-pay | Admitting: *Deleted

## 2018-06-29 NOTE — Telephone Encounter (Signed)
Agreed, elevate legs and update me as needed.  Thanks.

## 2018-06-29 NOTE — Telephone Encounter (Signed)
Patient advised.

## 2018-06-30 ENCOUNTER — Telehealth: Payer: Self-pay | Admitting: Family Medicine

## 2018-06-30 DIAGNOSIS — D509 Iron deficiency anemia, unspecified: Secondary | ICD-10-CM

## 2018-06-30 NOTE — Telephone Encounter (Signed)
Call pt.  GI wants us to f/u her anemia.  Reasonable.  Recheck cbc and iron level in 2 weeks.  I put in the orders.  If home health can draw it at home, then I'm okay with that too.  Thanks.

## 2018-06-30 NOTE — Telephone Encounter (Signed)
Patient notified as instructed by telephone and verbalized understanding. Patient stated that the home health nurse is on vacation and will be back next week. Patient went ahead and scheduled lab appointment at the office in two weeks. Patient stated that if the home health nurse wants to do it she will get her to call back to the office and let Dr. Para Marchuncan know.

## 2018-07-01 DIAGNOSIS — M8580 Other specified disorders of bone density and structure, unspecified site: Secondary | ICD-10-CM | POA: Diagnosis not present

## 2018-07-01 DIAGNOSIS — N183 Chronic kidney disease, stage 3 (moderate): Secondary | ICD-10-CM | POA: Diagnosis not present

## 2018-07-01 DIAGNOSIS — K219 Gastro-esophageal reflux disease without esophagitis: Secondary | ICD-10-CM | POA: Diagnosis not present

## 2018-07-01 DIAGNOSIS — D649 Anemia, unspecified: Secondary | ICD-10-CM | POA: Diagnosis not present

## 2018-07-01 DIAGNOSIS — J9621 Acute and chronic respiratory failure with hypoxia: Secondary | ICD-10-CM | POA: Diagnosis not present

## 2018-07-01 DIAGNOSIS — J441 Chronic obstructive pulmonary disease with (acute) exacerbation: Secondary | ICD-10-CM | POA: Diagnosis not present

## 2018-07-01 DIAGNOSIS — I4891 Unspecified atrial fibrillation: Secondary | ICD-10-CM | POA: Diagnosis not present

## 2018-07-01 DIAGNOSIS — E1122 Type 2 diabetes mellitus with diabetic chronic kidney disease: Secondary | ICD-10-CM | POA: Diagnosis not present

## 2018-07-01 DIAGNOSIS — I129 Hypertensive chronic kidney disease with stage 1 through stage 4 chronic kidney disease, or unspecified chronic kidney disease: Secondary | ICD-10-CM | POA: Diagnosis not present

## 2018-07-02 DIAGNOSIS — I129 Hypertensive chronic kidney disease with stage 1 through stage 4 chronic kidney disease, or unspecified chronic kidney disease: Secondary | ICD-10-CM | POA: Diagnosis not present

## 2018-07-02 DIAGNOSIS — J441 Chronic obstructive pulmonary disease with (acute) exacerbation: Secondary | ICD-10-CM | POA: Diagnosis not present

## 2018-07-02 DIAGNOSIS — K219 Gastro-esophageal reflux disease without esophagitis: Secondary | ICD-10-CM | POA: Diagnosis not present

## 2018-07-02 DIAGNOSIS — M8580 Other specified disorders of bone density and structure, unspecified site: Secondary | ICD-10-CM | POA: Diagnosis not present

## 2018-07-02 DIAGNOSIS — I4891 Unspecified atrial fibrillation: Secondary | ICD-10-CM | POA: Diagnosis not present

## 2018-07-02 DIAGNOSIS — E1122 Type 2 diabetes mellitus with diabetic chronic kidney disease: Secondary | ICD-10-CM | POA: Diagnosis not present

## 2018-07-02 DIAGNOSIS — D649 Anemia, unspecified: Secondary | ICD-10-CM | POA: Diagnosis not present

## 2018-07-02 DIAGNOSIS — J9621 Acute and chronic respiratory failure with hypoxia: Secondary | ICD-10-CM | POA: Diagnosis not present

## 2018-07-02 DIAGNOSIS — N183 Chronic kidney disease, stage 3 (moderate): Secondary | ICD-10-CM | POA: Diagnosis not present

## 2018-07-04 DIAGNOSIS — J441 Chronic obstructive pulmonary disease with (acute) exacerbation: Secondary | ICD-10-CM | POA: Diagnosis not present

## 2018-07-04 DIAGNOSIS — J449 Chronic obstructive pulmonary disease, unspecified: Secondary | ICD-10-CM | POA: Diagnosis not present

## 2018-07-07 ENCOUNTER — Encounter: Payer: Self-pay | Admitting: Family Medicine

## 2018-07-07 ENCOUNTER — Telehealth: Payer: Self-pay | Admitting: *Deleted

## 2018-07-07 DIAGNOSIS — I129 Hypertensive chronic kidney disease with stage 1 through stage 4 chronic kidney disease, or unspecified chronic kidney disease: Secondary | ICD-10-CM | POA: Diagnosis not present

## 2018-07-07 DIAGNOSIS — K219 Gastro-esophageal reflux disease without esophagitis: Secondary | ICD-10-CM | POA: Diagnosis not present

## 2018-07-07 DIAGNOSIS — M8580 Other specified disorders of bone density and structure, unspecified site: Secondary | ICD-10-CM | POA: Diagnosis not present

## 2018-07-07 DIAGNOSIS — I4891 Unspecified atrial fibrillation: Secondary | ICD-10-CM | POA: Diagnosis not present

## 2018-07-07 DIAGNOSIS — E1122 Type 2 diabetes mellitus with diabetic chronic kidney disease: Secondary | ICD-10-CM | POA: Diagnosis not present

## 2018-07-07 DIAGNOSIS — J9621 Acute and chronic respiratory failure with hypoxia: Secondary | ICD-10-CM | POA: Diagnosis not present

## 2018-07-07 DIAGNOSIS — J441 Chronic obstructive pulmonary disease with (acute) exacerbation: Secondary | ICD-10-CM | POA: Diagnosis not present

## 2018-07-07 DIAGNOSIS — D649 Anemia, unspecified: Secondary | ICD-10-CM | POA: Diagnosis not present

## 2018-07-07 DIAGNOSIS — N183 Chronic kidney disease, stage 3 (moderate): Secondary | ICD-10-CM | POA: Diagnosis not present

## 2018-07-07 NOTE — Telephone Encounter (Signed)
Please give order to check CBC and iron panel.  Dx anemia, D64.9. Thanks.

## 2018-07-07 NOTE — Telephone Encounter (Signed)
Verbal order given to Elease Hashimotoatricia as instructed by telephone and verbalized understanding.

## 2018-07-07 NOTE — Telephone Encounter (Signed)
Copied from CRM [phone]#[id number]. Topic: Quick Communication - Other Results >> Jul 07, 2018  9:59 AM Waymon AmatoBurton, Donna F wrote: Elease Hashimotoatricia is [facility] and is needing to get an order to check her hemaglobin    Best number [phone]

## 2018-07-08 DIAGNOSIS — J441 Chronic obstructive pulmonary disease with (acute) exacerbation: Secondary | ICD-10-CM | POA: Diagnosis not present

## 2018-07-08 DIAGNOSIS — M8580 Other specified disorders of bone density and structure, unspecified site: Secondary | ICD-10-CM | POA: Diagnosis not present

## 2018-07-08 DIAGNOSIS — I129 Hypertensive chronic kidney disease with stage 1 through stage 4 chronic kidney disease, or unspecified chronic kidney disease: Secondary | ICD-10-CM | POA: Diagnosis not present

## 2018-07-08 DIAGNOSIS — I4891 Unspecified atrial fibrillation: Secondary | ICD-10-CM | POA: Diagnosis not present

## 2018-07-08 DIAGNOSIS — J9621 Acute and chronic respiratory failure with hypoxia: Secondary | ICD-10-CM | POA: Diagnosis not present

## 2018-07-08 DIAGNOSIS — N183 Chronic kidney disease, stage 3 (moderate): Secondary | ICD-10-CM | POA: Diagnosis not present

## 2018-07-08 DIAGNOSIS — D649 Anemia, unspecified: Secondary | ICD-10-CM | POA: Diagnosis not present

## 2018-07-08 DIAGNOSIS — K219 Gastro-esophageal reflux disease without esophagitis: Secondary | ICD-10-CM | POA: Diagnosis not present

## 2018-07-08 DIAGNOSIS — E1122 Type 2 diabetes mellitus with diabetic chronic kidney disease: Secondary | ICD-10-CM | POA: Diagnosis not present

## 2018-07-09 ENCOUNTER — Telehealth: Payer: Self-pay | Admitting: Family Medicine

## 2018-07-09 NOTE — Telephone Encounter (Signed)
Samantha advised 

## 2018-07-09 NOTE — Telephone Encounter (Signed)
Copied from CRM [phone]#[id number]. Topic: Quick Communication - See Telephone Encounter >> Jul 09, 2018  1:26 PM Oneal GroutSebastian, Jennifer S wrote: CRM for notification. See Telephone encounter for: 07/09/18. [facility] calling. Missed Home Health visit due to illness. Physical Therapy would like another visit added on to care. Verbal order ok.

## 2018-07-09 NOTE — Telephone Encounter (Signed)
Please give the order.  Thanks.   

## 2018-07-12 DIAGNOSIS — K219 Gastro-esophageal reflux disease without esophagitis: Secondary | ICD-10-CM | POA: Diagnosis not present

## 2018-07-12 DIAGNOSIS — I129 Hypertensive chronic kidney disease with stage 1 through stage 4 chronic kidney disease, or unspecified chronic kidney disease: Secondary | ICD-10-CM | POA: Diagnosis not present

## 2018-07-12 DIAGNOSIS — N183 Chronic kidney disease, stage 3 (moderate): Secondary | ICD-10-CM | POA: Diagnosis not present

## 2018-07-12 DIAGNOSIS — I4891 Unspecified atrial fibrillation: Secondary | ICD-10-CM | POA: Diagnosis not present

## 2018-07-12 DIAGNOSIS — M8580 Other specified disorders of bone density and structure, unspecified site: Secondary | ICD-10-CM | POA: Diagnosis not present

## 2018-07-12 DIAGNOSIS — E1122 Type 2 diabetes mellitus with diabetic chronic kidney disease: Secondary | ICD-10-CM | POA: Diagnosis not present

## 2018-07-12 DIAGNOSIS — D649 Anemia, unspecified: Secondary | ICD-10-CM | POA: Diagnosis not present

## 2018-07-12 DIAGNOSIS — J9621 Acute and chronic respiratory failure with hypoxia: Secondary | ICD-10-CM | POA: Diagnosis not present

## 2018-07-12 DIAGNOSIS — J441 Chronic obstructive pulmonary disease with (acute) exacerbation: Secondary | ICD-10-CM | POA: Diagnosis not present

## 2018-07-12 LAB — LAB REPORT - SCANNED
Ferritin: 84
Hemoglobin: 10.5
Iron: 87
Platelets: 238
WBC: 11.3

## 2018-07-14 ENCOUNTER — Other Ambulatory Visit: Payer: Medicare HMO

## 2018-07-15 DIAGNOSIS — N183 Chronic kidney disease, stage 3 (moderate): Secondary | ICD-10-CM | POA: Diagnosis not present

## 2018-07-15 DIAGNOSIS — M8580 Other specified disorders of bone density and structure, unspecified site: Secondary | ICD-10-CM | POA: Diagnosis not present

## 2018-07-15 DIAGNOSIS — I4891 Unspecified atrial fibrillation: Secondary | ICD-10-CM | POA: Diagnosis not present

## 2018-07-15 DIAGNOSIS — D649 Anemia, unspecified: Secondary | ICD-10-CM | POA: Diagnosis not present

## 2018-07-15 DIAGNOSIS — J441 Chronic obstructive pulmonary disease with (acute) exacerbation: Secondary | ICD-10-CM | POA: Diagnosis not present

## 2018-07-15 DIAGNOSIS — E1122 Type 2 diabetes mellitus with diabetic chronic kidney disease: Secondary | ICD-10-CM | POA: Diagnosis not present

## 2018-07-15 DIAGNOSIS — I129 Hypertensive chronic kidney disease with stage 1 through stage 4 chronic kidney disease, or unspecified chronic kidney disease: Secondary | ICD-10-CM | POA: Diagnosis not present

## 2018-07-15 DIAGNOSIS — K219 Gastro-esophageal reflux disease without esophagitis: Secondary | ICD-10-CM | POA: Diagnosis not present

## 2018-07-15 DIAGNOSIS — J9621 Acute and chronic respiratory failure with hypoxia: Secondary | ICD-10-CM | POA: Diagnosis not present

## 2018-07-19 ENCOUNTER — Ambulatory Visit: Payer: Self-pay

## 2018-07-19 ENCOUNTER — Telehealth: Payer: Self-pay | Admitting: Family Medicine

## 2018-07-19 DIAGNOSIS — K219 Gastro-esophageal reflux disease without esophagitis: Secondary | ICD-10-CM | POA: Diagnosis not present

## 2018-07-19 DIAGNOSIS — D649 Anemia, unspecified: Secondary | ICD-10-CM | POA: Diagnosis not present

## 2018-07-19 DIAGNOSIS — E1122 Type 2 diabetes mellitus with diabetic chronic kidney disease: Secondary | ICD-10-CM | POA: Diagnosis not present

## 2018-07-19 DIAGNOSIS — N183 Chronic kidney disease, stage 3 (moderate): Secondary | ICD-10-CM | POA: Diagnosis not present

## 2018-07-19 DIAGNOSIS — J9621 Acute and chronic respiratory failure with hypoxia: Secondary | ICD-10-CM | POA: Diagnosis not present

## 2018-07-19 DIAGNOSIS — I129 Hypertensive chronic kidney disease with stage 1 through stage 4 chronic kidney disease, or unspecified chronic kidney disease: Secondary | ICD-10-CM | POA: Diagnosis not present

## 2018-07-19 DIAGNOSIS — J441 Chronic obstructive pulmonary disease with (acute) exacerbation: Secondary | ICD-10-CM | POA: Diagnosis not present

## 2018-07-19 DIAGNOSIS — M8580 Other specified disorders of bone density and structure, unspecified site: Secondary | ICD-10-CM | POA: Diagnosis not present

## 2018-07-19 DIAGNOSIS — I4891 Unspecified atrial fibrillation: Secondary | ICD-10-CM | POA: Diagnosis not present

## 2018-07-19 NOTE — Telephone Encounter (Addendum)
Meaning a form signed or an OV? If it's a form, then please send in.  If OV, then schedule.   Thanks.

## 2018-07-19 NOTE — Telephone Encounter (Signed)
Dominique HashimotoPatricia, RN with [facility] called in with patient c/o "SOB." She says "she has taken 2 breathing treatments and is still having wheezing, rhonchi, very tight. She's talking to me in complete sentences and I don't think she's critical enough for the hospital, but she does need to be evaluated. She has COPD, so she's SOB all the time, but it has gotten worse in the past couple of days. Her vitals are stable, BP 115/58 and oxygen sat is 97% on 3 L." I asked about other symptoms, she says "swelling and warmth to her left lower leg." According to protocol, see PCP within 3 days, no availability with PCP, appointment scheduled for tomorrow at 1030 with Nicki Reaperegina Baity, FNP, care advice given to patient who verbalized understanding.  Reason for Disposition . [1] MODERATE longstanding difficulty breathing (e.g., speaks in phrases, SOB even at rest, pulse 100-120) AND [2] SAME as normal  Answer Assessment - Initial Assessment Questions 1. RESPIRATORY STATUS: "Describe your breathing?" (e.g., wheezing, shortness of breath, unable to speak, severe coughing)      Wheezing, shortness of breath 2. ONSET: "When did this breathing problem begin?"      Worse in the last couple of days 3. PATTERN "Does the difficult breathing come and go, or has it been constant since it started?"      Constant 4. SEVERITY: "How bad is your breathing?" (e.g., mild, moderate, severe)    - MILD: No SOB at rest, mild SOB with walking, speaks normally in sentences, can lay down, no retractions, pulse < 100.    - MODERATE: SOB at rest, SOB with minimal exertion and prefers to sit, cannot lie down flat, speaks in phrases, mild retractions, audible wheezing, pulse 100-120.    - SEVERE: Very SOB at rest, speaks in single words, struggling to breathe, sitting hunched forward, retractions, pulse > 120      Moderate 5. RECURRENT SYMPTOM: "Have you had difficulty breathing before?" If so, ask: "When was the last time?" and "What happened  that time?"      Yes, COPD 6. CARDIAC HISTORY: "Do you have any history of heart disease?" (e.g., heart attack, angina, bypass surgery, angioplasty)      Yes 7. LUNG HISTORY: "Do you have any history of lung disease?"  (e.g., pulmonary embolus, asthma, emphysema)     COPD 8. CAUSE: "What do you think is causing the breathing problem?"      COPD 9. OTHER SYMPTOMS: "Do you have any other symptoms? (e.g., dizziness, runny nose, cough, chest pain, fever)     Swelling left lower leg, warmth to leg 10. PREGNANCY: "Is there any chance you are pregnant?" "When was your last menstrual period?"       No 11. TRAVEL: "Have you traveled out of the country in the last month?" (e.g., travel history, exposures)       No  Protocols used: BREATHING DIFFICULTY-A-AH

## 2018-07-19 NOTE — Telephone Encounter (Signed)
Patrician, RN with [facility] says she will need an a re-certification for Home Health-RN visits.

## 2018-07-20 ENCOUNTER — Ambulatory Visit (INDEPENDENT_AMBULATORY_CARE_PROVIDER_SITE_OTHER): Payer: Medicare HMO | Admitting: Internal Medicine

## 2018-07-20 ENCOUNTER — Encounter: Payer: Self-pay | Admitting: Internal Medicine

## 2018-07-20 VITALS — BP 130/78 | HR 133 | Temp 97.6°F

## 2018-07-20 DIAGNOSIS — J9621 Acute and chronic respiratory failure with hypoxia: Secondary | ICD-10-CM | POA: Diagnosis not present

## 2018-07-20 DIAGNOSIS — D649 Anemia, unspecified: Secondary | ICD-10-CM | POA: Diagnosis not present

## 2018-07-20 DIAGNOSIS — R059 Cough, unspecified: Secondary | ICD-10-CM

## 2018-07-20 DIAGNOSIS — R0602 Shortness of breath: Secondary | ICD-10-CM | POA: Diagnosis not present

## 2018-07-20 DIAGNOSIS — J441 Chronic obstructive pulmonary disease with (acute) exacerbation: Secondary | ICD-10-CM | POA: Diagnosis not present

## 2018-07-20 DIAGNOSIS — E1122 Type 2 diabetes mellitus with diabetic chronic kidney disease: Secondary | ICD-10-CM | POA: Diagnosis not present

## 2018-07-20 DIAGNOSIS — R05 Cough: Secondary | ICD-10-CM | POA: Diagnosis not present

## 2018-07-20 DIAGNOSIS — K219 Gastro-esophageal reflux disease without esophagitis: Secondary | ICD-10-CM | POA: Diagnosis not present

## 2018-07-20 DIAGNOSIS — I129 Hypertensive chronic kidney disease with stage 1 through stage 4 chronic kidney disease, or unspecified chronic kidney disease: Secondary | ICD-10-CM | POA: Diagnosis not present

## 2018-07-20 DIAGNOSIS — I4891 Unspecified atrial fibrillation: Secondary | ICD-10-CM | POA: Diagnosis not present

## 2018-07-20 DIAGNOSIS — I5033 Acute on chronic diastolic (congestive) heart failure: Secondary | ICD-10-CM | POA: Diagnosis not present

## 2018-07-20 DIAGNOSIS — M8580 Other specified disorders of bone density and structure, unspecified site: Secondary | ICD-10-CM | POA: Diagnosis not present

## 2018-07-20 DIAGNOSIS — N183 Chronic kidney disease, stage 3 (moderate): Secondary | ICD-10-CM | POA: Diagnosis not present

## 2018-07-20 MED ORDER — AMOXICILLIN-POT CLAVULANATE 875-125 MG PO TABS: 1.0000 | ORAL_TABLET | Freq: Two times a day (BID) | ORAL | 0 refills | Status: DC

## 2018-07-20 MED ORDER — POTASSIUM CHLORIDE ER 10 MEQ PO TBCR: 10.0000 meq | EXTENDED_RELEASE_TABLET | Freq: Every day | ORAL | 0 refills | Status: DC

## 2018-07-20 MED ORDER — FUROSEMIDE 20 MG PO TABS: 20.0000 mg | ORAL_TABLET | Freq: Every day | ORAL | 0 refills | Status: DC

## 2018-07-20 NOTE — Patient Instructions (Signed)
Chronic Obstructive Pulmonary Disease Exacerbation  Chronic obstructive pulmonary disease (COPD) is a common lung problem. In COPD, the flow of air from the lungs is limited. COPD exacerbations are times that breathing gets worse and you need extra treatment. Without treatment they can be life threatening. If they happen often, your lungs can become more damaged. If your COPD gets worse, your doctor may treat you with:  ? Medicines.  ? Oxygen.  ? Different ways to clear your airway, such as using a mask.    Follow these instructions at home:  ? Do not smoke.  ? Avoid tobacco smoke and other things that bother your lungs.  ? If given, take your antibiotic medicine as told. Finish the medicine even if you start to feel better.  ? Only take medicines as told by your doctor.  ? Drink enough fluids to keep your pee (urine) clear or pale yellow (unless your doctor has told you not to).  ? Use a cool mist machine (vaporizer).  ? If you use oxygen or a machine that turns liquid medicine into a mist (nebulizer), continue to use them as told.  ? Keep up with shots (vaccinations) as told by your doctor.  ? Exercise regularly.  ? Eat healthy foods.  ? Keep all doctor visits as told.  Get help right away if:  ? You are very short of breath and it gets worse.  ? You have trouble talking.  ? You have bad chest pain.  ? You have blood in your spit (sputum).  ? You have a fever.  ? You keep throwing up (vomiting).  ? You feel weak, or you pass out (faint).  ? You feel confused.  ? You keep getting worse.  This information is not intended to replace advice given to you by your health care provider. Make sure you discuss any questions you have with your health care provider.  Document Released: 12/04/2011 Document Revised: 05/22/2016 Document Reviewed: 08/19/2013  Elsevier Interactive Patient Education ? 2017 [employer]

## 2018-07-20 NOTE — Telephone Encounter (Signed)
[facility]Patricia with [facility] says she just needs a verbal order for more visits.  Order given per Dr. Para Marchuncan.

## 2018-07-20 NOTE — Progress Notes (Signed)
Subjective:    Patient ID: Dominique Clark, female    DOB: 1941/12/24, 77 y.o.   MRN: [id number]  HPI  Pt presents to the clinic today with c/o cough, wheezing and shortness of breath. This started about 3 days ago. The cough is nonproductive. She is short of breath all the time but seems worse in the last 3 days. She denies fever, chills or body aches. She does have a history of COPD on 3L Center, DM 2 and CHF. She is taking her Symbicort, Albuterol and Duonebs as prescribed. She denies sick contacts but was recently hospitalized for IDA. She has not taken anything OTC for her symptoms.  Review of Systems  Past Medical History:  Diagnosis Date  . AF (atrial fibrillation) (HCC)   . Asthma   . COPD (chronic obstructive pulmonary disease) (HCC)   . Diabetes mellitus    type 2  . Diverticulosis    s/p colonoscopy  . Gastritis    via EGD  . Hiatal hernia    s/p dilation 2004  . Osteopenia   . Stress incontinence, female   . TIA (transient ischemic attack)     Current Outpatient Medications  Medication Sig Dispense Refill  . albuterol (PROVENTIL HFA;VENTOLIN HFA) 108 (90 Base) MCG/ACT inhaler Inhale 1-2 puffs into the lungs every 4 (four) hours as needed for wheezing. Okay to fill with ventolin or proventil if needed. 3 Inhaler 3  . apixaban (ELIQUIS) 5 MG TABS tablet Take 1 tablet (5 mg total) by mouth 2 (two) times daily. 180 tablet 3  . atorvastatin (LIPITOR) 20 MG tablet Take 1 tablet (20 mg total) by mouth daily. 90 tablet 3  . budesonide-formoterol (SYMBICORT) 160-4.5 MCG/ACT inhaler INHALE 2 PUFFS EVERY 12 HOURS TO PREVENTCOUGH OR WHEEZING--RINSE, GARGLE, & SPITAFTER EACH USE. (Patient taking differently: Inhale 2 puffs into the lungs every 12 (twelve) hours. TO PREVENT COUGH OR WHEEZING--RINSE, GARGLE, & SPIT AFTER EACH USE.) 3 Inhaler 3  . cholecalciferol (VITAMIN D) 1000 units tablet Take 1,000 Units by mouth daily.    Marland Kitchen diltiazem (CARDIZEM CD) 120 MG 24 hr capsule Take 1 capsule  (120 mg total) by mouth at bedtime. 90 capsule 3  . docusate sodium (COLACE) 100 MG capsule Take 1 capsule (100 mg total) by mouth 2 (two) times daily. 100 capsule 3  . esomeprazole (NEXIUM) 20 MG capsule Take 20 mg by mouth daily at 12 noon.    . ferrous sulfate 325 (65 FE) MG tablet Take 1 tablet (325 mg total) by mouth 2 (two) times daily with a meal. 180 tablet 3  . fluticasone (FLONASE) 50 MCG/ACT nasal spray Place 2 sprays into both nostrils daily as needed (seasonal allergies).    Marland Kitchen ipratropium-albuterol (DUONEB) 0.5-2.5 (3) MG/3ML SOLN Take 3 mLs by nebulization every 4 (four) hours as needed. Dx:J44.9 360 mL 1  . metoprolol succinate (TOPROL-XL) 50 MG 24 hr tablet Take 1 tablet (50 mg total) by mouth daily. Take with or immediately following a meal. 90 tablet 3  . NON FORMULARY Oxygen 3 liters 24/7    . traZODone (DESYREL) 50 MG tablet Take 0.5-1 tablets (25-50 mg total) by mouth at bedtime as needed for sleep. 30 tablet 3   No current facility-administered medications for this visit.     Allergies  Allergen Reactions  . Doxycycline Nausea And Vomiting    Family History  Problem Relation Age of Onset  . Stroke Mother   . Breast cancer Neg Hx   .  Colon cancer Neg Hx     Social History   Socioeconomic History  . Marital status: Single    Spouse name: Not on file  . Number of children: 2  . Years of education: Not on file  . Highest education level: Not on file  Occupational History  . Occupation: [employer]     Employer: RETIRED    Comment: [employer]  Social Needs  . Financial resource strain: Not on file  . Food insecurity:    Worry: Not on file    Inability: Not on file  . Transportation needs:    Medical: Not on file    Non-medical: Not on file  Tobacco Use  . Smoking status: Former Smoker    Types: Cigarettes    Last attempt to quit: 12/29/1988    Years since quitting: 29.5  . Smokeless tobacco: Never Used  . Tobacco comment: 20 or more years   Substance and  Sexual Activity  . Alcohol use: No    Alcohol/week: 0.0 oz  . Drug use: No  . Sexual activity: Never  Lifestyle  . Physical activity:    Days per week: Not on file    Minutes per session: Not on file  . Stress: Not on file  Relationships  . Social connections:    Talks on phone: Not on file    Gets together: Not on file    Attends religious service: Not on file    Active member of club or organization: Not on file    Attends meetings of clubs or organizations: Not on file    Relationship status: Not on file  . Intimate partner violence:    Fear of current or ex partner: Not on file    Emotionally abused: Not on file    Physically abused: Not on file    Forced sexual activity: Not on file  Other Topics Concern  . Not on file  Social History Narrative   Marital Status: divorced   Children: 2 children, 1 grandchild   Occupation: retired from [employer] and [employer]     Constitutional: Denies fever, malaise, fatigue, headache or abrupt weight changes.  HEENT: Denies eye pain, eye redness, ear pain, ringing in the ears, wax buildup, runny nose, nasal congestion, bloody nose, or sore throat. Respiratory: Pt reports cough, wheezing and SOB. Denies difficulty breathing, or sputum production.   Cardiovascular: Denies chest pain, chest tightness, palpitations or swelling in the hands or feet.   No other specific complaints in a complete review of systems (except as listed in HPI above).     Objective:   Physical Exam   BP 130/78   Pulse (!) 133   Temp 97.6 F (36.4 C) (Oral)   SpO2 100%   Wt Readings from Last 3 Encounters:  06/28/18 135 lb 12.8 oz (61.6 kg)  06/17/18 124 lb 6.4 oz (56.4 kg)  05/13/18 140 lb 14 oz (63.9 kg)    General: Appears her stated age, chronically ill appearing, in NAD. HEENT: Ears: Tm's gray and intact, normal light reflex; Nose: mucosa pink and moist, septum midline; Throat/Mouth: Teeth present, mucosa pink and moist,  no exudate, lesions or ulcerations noted.  Neck:  No adenopathy noted. Cardiovascular: Irregular, tachycardic. Pulmonary/Chest: Normal effort and diminished breath sounds. No respiratory distress.    BMET    Component Value Date/Time   NA 138 06/24/2018 1223   K 4.9 06/24/2018 1223   CL 101 06/24/2018 1223  CO2 32 06/24/2018 1223   GLUCOSE 122 (H) 06/24/2018 1223   BUN 13 06/24/2018 1223   CREATININE 1.07 06/24/2018 1223   CALCIUM 9.6 06/24/2018 1223   GFRNONAA 49 (L) 06/17/2018 0439   GFRAA 57 (L) 06/17/2018 0439    Lipid Panel     Component Value Date/Time   CHOL 144 11/28/2017 0326   TRIG 75 11/28/2017 0326   HDL 46 11/28/2017 0326   CHOLHDL 3.1 11/28/2017 0326   VLDL 15 11/28/2017 0326   LDLCALC 83 11/28/2017 0326    CBC    Component Value Date/Time   WBC 14.3 (H) 06/24/2018 1223   RBC 4.03 06/24/2018 1223   HGB 11.2 (L) 06/24/2018 1223   HCT 35.4 (L) 06/24/2018 1223   PLT 306.0 06/24/2018 1223   MCV 87.8 06/24/2018 1223   MCH 27.1 06/17/2018 0439   MCHC 31.7 06/24/2018 1223   RDW 19.2 (H) 06/24/2018 1223   LYMPHSABS 1.2 06/24/2018 1223   MONOABS 0.9 06/24/2018 1223   EOSABS 0.0 06/24/2018 1223   BASOSABS 0.1 06/24/2018 1223    Hgb A1C Lab Results  Component Value Date   HGBA1C 6.1 06/15/2018           Assessment & Plan:   Cough, SOB:  ? CHF flare vs COPD exacerbation eRx for Lasix 20 mg daily x 5 days, along with Potassium supplement eRx for Augmentin BID x 7 days Discussed with Dr. Para Marchuncan- recommends chest xray, CBC, CMET and BNP- pt refused, reports she just had labs done by home health- will request records  Advised follow up with PCP in 1 week Nicki Reaperegina Baity, NP

## 2018-07-23 ENCOUNTER — Encounter: Payer: Self-pay | Admitting: Family Medicine

## 2018-07-23 ENCOUNTER — Encounter: Payer: Self-pay | Admitting: Internal Medicine

## 2018-07-27 DIAGNOSIS — D649 Anemia, unspecified: Secondary | ICD-10-CM | POA: Diagnosis not present

## 2018-07-27 DIAGNOSIS — M8580 Other specified disorders of bone density and structure, unspecified site: Secondary | ICD-10-CM | POA: Diagnosis not present

## 2018-07-27 DIAGNOSIS — I129 Hypertensive chronic kidney disease with stage 1 through stage 4 chronic kidney disease, or unspecified chronic kidney disease: Secondary | ICD-10-CM | POA: Diagnosis not present

## 2018-07-27 DIAGNOSIS — K219 Gastro-esophageal reflux disease without esophagitis: Secondary | ICD-10-CM | POA: Diagnosis not present

## 2018-07-27 DIAGNOSIS — J441 Chronic obstructive pulmonary disease with (acute) exacerbation: Secondary | ICD-10-CM | POA: Diagnosis not present

## 2018-07-27 DIAGNOSIS — I4891 Unspecified atrial fibrillation: Secondary | ICD-10-CM | POA: Diagnosis not present

## 2018-07-27 DIAGNOSIS — E1122 Type 2 diabetes mellitus with diabetic chronic kidney disease: Secondary | ICD-10-CM | POA: Diagnosis not present

## 2018-07-27 DIAGNOSIS — J9621 Acute and chronic respiratory failure with hypoxia: Secondary | ICD-10-CM | POA: Diagnosis not present

## 2018-07-27 DIAGNOSIS — N183 Chronic kidney disease, stage 3 (moderate): Secondary | ICD-10-CM | POA: Diagnosis not present

## 2018-07-29 ENCOUNTER — Ambulatory Visit (INDEPENDENT_AMBULATORY_CARE_PROVIDER_SITE_OTHER): Payer: Medicare HMO | Admitting: Family Medicine

## 2018-07-29 ENCOUNTER — Encounter: Payer: Self-pay | Admitting: Family Medicine

## 2018-07-29 DIAGNOSIS — I5033 Acute on chronic diastolic (congestive) heart failure: Secondary | ICD-10-CM | POA: Diagnosis not present

## 2018-07-29 DIAGNOSIS — J441 Chronic obstructive pulmonary disease with (acute) exacerbation: Secondary | ICD-10-CM | POA: Diagnosis not present

## 2018-07-29 DIAGNOSIS — R0602 Shortness of breath: Secondary | ICD-10-CM

## 2018-07-29 DIAGNOSIS — D649 Anemia, unspecified: Secondary | ICD-10-CM

## 2018-07-29 DIAGNOSIS — G8929 Other chronic pain: Secondary | ICD-10-CM | POA: Diagnosis not present

## 2018-07-29 DIAGNOSIS — M25561 Pain in right knee: Secondary | ICD-10-CM

## 2018-07-29 DIAGNOSIS — M25562 Pain in left knee: Secondary | ICD-10-CM

## 2018-07-29 LAB — BASIC METABOLIC PANEL
BUN: 11 mg/dL (ref 6–23)
CALCIUM: 9.7 mg/dL (ref 8.4–10.5)
CO2: 31 meq/L (ref 19–32)
CREATININE: 0.99 mg/dL (ref 0.40–1.20)
Chloride: 102 mEq/L (ref 96–112)
GFR: 57.66 mL/min — ABNORMAL LOW (ref >60.00)
GLUCOSE: 120 mg/dL — AB (ref 70–99)
Potassium: 4.6 mEq/L (ref 3.5–5.1)
Sodium: 139 mEq/L (ref 135–145)

## 2018-07-29 LAB — BRAIN NATRIURETIC PEPTIDE: PRO B NATRI PEPTIDE: 230 pg/mL — AB (ref 0.0–100.0)

## 2018-07-29 MED ORDER — POTASSIUM CHLORIDE ER 10 MEQ PO TBCR: 10.0000 meq | EXTENDED_RELEASE_TABLET | Freq: Every day | ORAL | 1 refills | Status: DC | PRN

## 2018-07-29 MED ORDER — TRAMADOL HCL 50 MG PO TABS: 50.0000 mg | ORAL_TABLET | Freq: Two times a day (BID) | ORAL | 1 refills | Status: DC | PRN

## 2018-07-29 MED ORDER — FUROSEMIDE 20 MG PO TABS: 20.0000 mg | ORAL_TABLET | Freq: Every day | ORAL | 1 refills | Status: DC | PRN

## 2018-07-29 NOTE — Patient Instructions (Signed)
Go to the lab on the way out.  We'll contact you with your lab report. Restart lasix and take it daily if needed for swelling.  If you take lasix, then take potassium.   Update me in a few days, sooner if needed.  Take care.  Glad to see you.

## 2018-07-29 NOTE — Progress Notes (Signed)
She has 2 doses of augmentin left.  She is still on baseline inhalers.   She is done with 5 days of lasix with potassium.  Off both meds for a few days now. Still with some SOB but better than last week.    Still on iron at baseline.  Still on anticoagulation.    She feels some better from last week.  Still with some wheeze but clearly better, "not like I was last week."   She had some weight loss with lasix use, ie loss of fluid.  BLE slightly worse off lasix in the last few days.    No fevers.  Sputum is white now, improved from prev. Still with cough.  Still on O2.    H/o anemia with recent HGB 10.5.  She has GI f/u pending, with colonoscopy at the hospital pending.    She is having more leg pain, esp with arthritis and worse with more BLE edema.  She had more pain with walking.  No ADE on tramadol prev.  D/w pt about restart PRN use.    Meds, vitals, and allergies reviewed.   ROS: Per HPI unless specifically indicated in ROS section   nad ncat On O2 Speaking in complete sentences. Mucous membranes moist Neck supple, no lymphadenopathy IRR.   Clear to auscultation except for occasional cough noted.  No wheeze.  No focal decrease in breath sounds. abd soft, not ttp 1-2+ BLE edema.

## 2018-07-30 NOTE — Assessment & Plan Note (Signed)
H/o anemia with recent HGB 10.5.  She has GI f/u pending, with colonoscopy at the hospital pending.  Continue iron for now.

## 2018-07-30 NOTE — Assessment & Plan Note (Signed)
Discussed with patient about restart of tramadol as needed.  No sedation on med.  It did help the pain previously.  Prescription sent.  She will update me as needed.  Routine cautions given.

## 2018-07-30 NOTE — Assessment & Plan Note (Addendum)
She likely has a combination of CHF and COPD.  Restart Lasix with potassium.  When she takes Lasix, then she will take potassium.  If her swelling is controlled, it would be okay to skip a day with both.  Discussed with patient.  She understood.  She will update me about her symptoms and swelling as she goes along.  At this point still okay for outpatient follow-up.  See notes on labs.    Regarding COPD, continue baseline inhalers and oxygen.  She is going to finish her current dose of antibiotics.  She does look improved.  She will update me as needed.  >25 minutes spent in face to face time with patient, >50% spent in counselling or coordination of care.

## 2018-08-04 DIAGNOSIS — J449 Chronic obstructive pulmonary disease, unspecified: Secondary | ICD-10-CM | POA: Diagnosis not present

## 2018-08-04 DIAGNOSIS — J441 Chronic obstructive pulmonary disease with (acute) exacerbation: Secondary | ICD-10-CM | POA: Diagnosis not present

## 2018-08-05 DIAGNOSIS — I4891 Unspecified atrial fibrillation: Secondary | ICD-10-CM | POA: Diagnosis not present

## 2018-08-05 DIAGNOSIS — J441 Chronic obstructive pulmonary disease with (acute) exacerbation: Secondary | ICD-10-CM | POA: Diagnosis not present

## 2018-08-05 DIAGNOSIS — K219 Gastro-esophageal reflux disease without esophagitis: Secondary | ICD-10-CM | POA: Diagnosis not present

## 2018-08-05 DIAGNOSIS — N183 Chronic kidney disease, stage 3 (moderate): Secondary | ICD-10-CM | POA: Diagnosis not present

## 2018-08-05 DIAGNOSIS — I129 Hypertensive chronic kidney disease with stage 1 through stage 4 chronic kidney disease, or unspecified chronic kidney disease: Secondary | ICD-10-CM | POA: Diagnosis not present

## 2018-08-05 DIAGNOSIS — D649 Anemia, unspecified: Secondary | ICD-10-CM | POA: Diagnosis not present

## 2018-08-05 DIAGNOSIS — J9621 Acute and chronic respiratory failure with hypoxia: Secondary | ICD-10-CM | POA: Diagnosis not present

## 2018-08-05 DIAGNOSIS — E1122 Type 2 diabetes mellitus with diabetic chronic kidney disease: Secondary | ICD-10-CM | POA: Diagnosis not present

## 2018-08-05 DIAGNOSIS — M8580 Other specified disorders of bone density and structure, unspecified site: Secondary | ICD-10-CM | POA: Diagnosis not present

## 2018-08-16 DIAGNOSIS — M8580 Other specified disorders of bone density and structure, unspecified site: Secondary | ICD-10-CM | POA: Diagnosis not present

## 2018-08-16 DIAGNOSIS — J9621 Acute and chronic respiratory failure with hypoxia: Secondary | ICD-10-CM | POA: Diagnosis not present

## 2018-08-16 DIAGNOSIS — D649 Anemia, unspecified: Secondary | ICD-10-CM | POA: Diagnosis not present

## 2018-08-16 DIAGNOSIS — I4891 Unspecified atrial fibrillation: Secondary | ICD-10-CM | POA: Diagnosis not present

## 2018-08-16 DIAGNOSIS — J441 Chronic obstructive pulmonary disease with (acute) exacerbation: Secondary | ICD-10-CM | POA: Diagnosis not present

## 2018-08-16 DIAGNOSIS — K219 Gastro-esophageal reflux disease without esophagitis: Secondary | ICD-10-CM | POA: Diagnosis not present

## 2018-08-16 DIAGNOSIS — I129 Hypertensive chronic kidney disease with stage 1 through stage 4 chronic kidney disease, or unspecified chronic kidney disease: Secondary | ICD-10-CM | POA: Diagnosis not present

## 2018-08-16 DIAGNOSIS — E1122 Type 2 diabetes mellitus with diabetic chronic kidney disease: Secondary | ICD-10-CM | POA: Diagnosis not present

## 2018-08-16 DIAGNOSIS — N183 Chronic kidney disease, stage 3 (moderate): Secondary | ICD-10-CM | POA: Diagnosis not present

## 2018-08-29 DIAGNOSIS — J441 Chronic obstructive pulmonary disease with (acute) exacerbation: Secondary | ICD-10-CM | POA: Diagnosis not present

## 2018-09-02 ENCOUNTER — Telehealth: Payer: Self-pay

## 2018-09-02 DIAGNOSIS — I129 Hypertensive chronic kidney disease with stage 1 through stage 4 chronic kidney disease, or unspecified chronic kidney disease: Secondary | ICD-10-CM | POA: Diagnosis not present

## 2018-09-02 DIAGNOSIS — M8580 Other specified disorders of bone density and structure, unspecified site: Secondary | ICD-10-CM | POA: Diagnosis not present

## 2018-09-02 DIAGNOSIS — D649 Anemia, unspecified: Secondary | ICD-10-CM | POA: Diagnosis not present

## 2018-09-02 DIAGNOSIS — E1122 Type 2 diabetes mellitus with diabetic chronic kidney disease: Secondary | ICD-10-CM | POA: Diagnosis not present

## 2018-09-02 DIAGNOSIS — J441 Chronic obstructive pulmonary disease with (acute) exacerbation: Secondary | ICD-10-CM | POA: Diagnosis not present

## 2018-09-02 DIAGNOSIS — N183 Chronic kidney disease, stage 3 (moderate): Secondary | ICD-10-CM | POA: Diagnosis not present

## 2018-09-02 DIAGNOSIS — K219 Gastro-esophageal reflux disease without esophagitis: Secondary | ICD-10-CM | POA: Diagnosis not present

## 2018-09-02 DIAGNOSIS — J9621 Acute and chronic respiratory failure with hypoxia: Secondary | ICD-10-CM | POA: Diagnosis not present

## 2018-09-02 DIAGNOSIS — I4891 Unspecified atrial fibrillation: Secondary | ICD-10-CM | POA: Diagnosis not present

## 2018-09-02 NOTE — Telephone Encounter (Signed)
Plz schedule office visit for today or tomorrow with available provider, ok to schedule with me.

## 2018-09-02 NOTE — Telephone Encounter (Signed)
Copied from CRM [id number]. Topic: General - Other >> Sep 02, 2018 12:32 PM Gerrianne Scale wrote: Reason for CRM: [facility] [phone]  calling stating that she is at pt home and pt was complaining of coughing up green mucus and that the Nurse was listening to pt lungs and that they sound full pt has no fever and vitals are normal pt noticed the color change in mucus yesterday she wants to know if pt need an appt or something could be called into pharmacy [facility] - [place], [place] - [address] [phone] (Phone) [phone] (Fax)

## 2018-09-02 NOTE — Telephone Encounter (Signed)
Tried to phone the patient on 2 different occasions with no answer.  Left message on patient's voicemail to return call. Will continue to try as the afternoon progresses.

## 2018-09-03 ENCOUNTER — Ambulatory Visit (INDEPENDENT_AMBULATORY_CARE_PROVIDER_SITE_OTHER): Payer: Medicare HMO | Admitting: Family Medicine

## 2018-09-03 ENCOUNTER — Encounter: Payer: Self-pay | Admitting: Family Medicine

## 2018-09-03 VITALS — BP 120/80 | HR 116 | Temp 97.8°F | Ht 62.5 in | Wt 134.8 lb

## 2018-09-03 DIAGNOSIS — E1129 Type 2 diabetes mellitus with other diabetic kidney complication: Secondary | ICD-10-CM

## 2018-09-03 DIAGNOSIS — J441 Chronic obstructive pulmonary disease with (acute) exacerbation: Secondary | ICD-10-CM | POA: Diagnosis not present

## 2018-09-03 DIAGNOSIS — I4891 Unspecified atrial fibrillation: Secondary | ICD-10-CM

## 2018-09-03 DIAGNOSIS — J449 Chronic obstructive pulmonary disease, unspecified: Secondary | ICD-10-CM | POA: Diagnosis not present

## 2018-09-03 MED ORDER — APIXABAN 5 MG PO TABS: 5.0000 mg | ORAL_TABLET | Freq: Two times a day (BID) | ORAL | 3 refills | Status: DC

## 2018-09-03 MED ORDER — AMOXICILLIN-POT CLAVULANATE 875-125 MG PO TABS: 1.0000 | ORAL_TABLET | Freq: Two times a day (BID) | ORAL | 0 refills | Status: AC

## 2018-09-03 MED ORDER — PREDNISONE 20 MG PO TABS: 40.0000 mg | ORAL_TABLET | Freq: Every day | ORAL | 0 refills | Status: DC

## 2018-09-03 NOTE — Assessment & Plan Note (Addendum)
COPD exacerbation - treat with agumentin 10d course + prednisone 40mg  5d course. She will continue PRN albuterol nebulizer treatment.s Push fluids and rest (caution with overhydration in CHF history). Update if not improving with treatment. Pt and caregiver agree.

## 2018-09-03 NOTE — Patient Instructions (Addendum)
I do think you have COPD flare - treat with augmentin for 10 days, prednisone course for 5 days, rest. Increase water to stay well hydrated May use albuterol breathing treatment as needed for wheezing.

## 2018-09-03 NOTE — Assessment & Plan Note (Signed)
Well controlled - she states she has tolerated prednisone well in the past.

## 2018-09-03 NOTE — Telephone Encounter (Signed)
Phoned patient and scheduled her for an appointment with Dr. Reece Agar on Friday, Sept 6th.

## 2018-09-03 NOTE — Progress Notes (Signed)
BP 120/80 (BP Location: Left Arm, Patient Position: Sitting, Cuff Size: Normal)   Pulse (!) 116   Temp 97.8 F (36.6 C) (Oral)   Ht 5' 2.5" (1.588 m)   Wt 134 lb 12 oz (61.1 kg)   SpO2 96% Comment: 2 L, continuous  BMI 24.25 kg/m    CC: cough Subjective:    Patient ID: Dominique Clark, female    DOB: 1941-02-04, 77 y.o.   MRN: [id number]  HPI: Dominique Clark is a 77 y.o. female presenting on 09/03/2018 for Cough (C/o productive cough with green mucous. Started about 2 days ago and mucous was thick and white. Tried Nyquil, not helpful. H/o COPD. Pt accompanied by caregiver, Maybelle. )   Known COPD, afib ?CHF on eliquis (refilled today locally 30 d supply as she's currently in donut hole).   2d h/o increased cough productive of thick green and white sputum associated with increased dyspnea. Some wheezing. Chest > nasal congestion.   Denies fevers/chills.  No sick contacts at home Remote smoker - remains abstinent. Caregiver smokes but outdoors.   Relevant past medical, surgical, family and social history reviewed and updated as indicated. Interim medical history since our last visit reviewed. Allergies and medications reviewed and updated. Outpatient Medications Prior to Visit  Medication Sig Dispense Refill  . albuterol (PROVENTIL HFA;VENTOLIN HFA) 108 (90 Base) MCG/ACT inhaler Inhale 1-2 puffs into the lungs every 4 (four) hours as needed for wheezing. Okay to fill with ventolin or proventil if needed. 3 Inhaler 3  . atorvastatin (LIPITOR) 20 MG tablet Take 1 tablet (20 mg total) by mouth daily. 90 tablet 3  . budesonide-formoterol (SYMBICORT) 160-4.5 MCG/ACT inhaler INHALE 2 PUFFS EVERY 12 HOURS TO PREVENTCOUGH OR WHEEZING--RINSE, GARGLE, & SPITAFTER EACH USE. (Patient taking differently: Inhale 2 puffs into the lungs every 12 (twelve) hours. TO PREVENT COUGH OR WHEEZING--RINSE, GARGLE, & SPIT AFTER EACH USE.) 3 Inhaler 3  . cholecalciferol (VITAMIN D) 1000 units tablet Take 1,000  Units by mouth daily.    Marland Kitchen diltiazem (CARDIZEM CD) 120 MG 24 hr capsule Take 1 capsule (120 mg total) by mouth at bedtime. 90 capsule 3  . docusate sodium (COLACE) 100 MG capsule Take 1 capsule (100 mg total) by mouth 2 (two) times daily. 100 capsule 3  . esomeprazole (NEXIUM) 20 MG capsule Take 20 mg by mouth daily at 12 noon.    . ferrous sulfate 325 (65 FE) MG tablet Take 1 tablet (325 mg total) by mouth 2 (two) times daily with a meal. 180 tablet 3  . fluticasone (FLONASE) 50 MCG/ACT nasal spray Place 2 sprays into both nostrils daily as needed (seasonal allergies).    . furosemide (LASIX) 20 MG tablet Take 1 tablet (20 mg total) by mouth daily as needed for fluid (take with potassium). 30 tablet 1  . ipratropium-albuterol (DUONEB) 0.5-2.5 (3) MG/3ML SOLN Take 3 mLs by nebulization every 4 (four) hours as needed. Dx:J44.9 360 mL 1  . metoprolol succinate (TOPROL-XL) 50 MG 24 hr tablet Take 1 tablet (50 mg total) by mouth daily. Take with or immediately following a meal. 90 tablet 3  . NON FORMULARY Oxygen 3 liters 24/7    . potassium chloride (K-DUR) 10 MEQ tablet Take 1 tablet (10 mEq total) by mouth daily as needed (take with lasix). 30 tablet 1  . traMADol (ULTRAM) 50 MG tablet Take 1-2 tablets (50-100 mg total) by mouth every 12 (twelve) hours as needed. 180 tablet 1  . traZODone (DESYREL)  50 MG tablet Take 0.5-1 tablets (25-50 mg total) by mouth at bedtime as needed for sleep. 30 tablet 3  . apixaban (ELIQUIS) 5 MG TABS tablet Take 1 tablet (5 mg total) by mouth 2 (two) times daily. 180 tablet 3  . amoxicillin-clavulanate (AUGMENTIN) 875-125 MG tablet Take 1 tablet by mouth 2 (two) times daily. 20 tablet 0   No facility-administered medications prior to visit.      Per HPI unless specifically indicated in ROS section below Review of Systems     Objective:    BP 120/80 (BP Location: Left Arm, Patient Position: Sitting, Cuff Size: Normal)   Pulse (!) 116   Temp 97.8 F (36.6 C)  (Oral)   Ht 5' 2.5" (1.588 m)   Wt 134 lb 12 oz (61.1 kg)   SpO2 96% Comment: 2 L, continuous  BMI 24.25 kg/m   Wt Readings from Last 3 Encounters:  09/03/18 134 lb 12 oz (61.1 kg)  06/28/18 135 lb 12.8 oz (61.6 kg)  06/17/18 124 lb 6.4 oz (56.4 kg)    Physical Exam  Constitutional: She appears well-developed and well-nourished. No distress.  HENT:  Head: Normocephalic and atraumatic.  Right Ear: Hearing, tympanic membrane, external ear and ear canal normal.  Left Ear: Hearing, tympanic membrane, external ear and ear canal normal.  Nose: No mucosal edema or rhinorrhea. Right sinus exhibits no maxillary sinus tenderness and no frontal sinus tenderness. Left sinus exhibits maxillary sinus tenderness (mild). Left sinus exhibits no frontal sinus tenderness.  Mouth/Throat: Uvula is midline and mucous membranes are normal. No oropharyngeal exudate, posterior oropharyngeal edema, posterior oropharyngeal erythema or tonsillar abscesses.  Somewhat dry mm  Eyes: Pupils are equal, round, and reactive to light. Conjunctivae and EOM are normal. No scleral icterus.  Neck: Normal range of motion. Neck supple.  Cardiovascular: Normal rate, regular rhythm, normal heart sounds and intact distal pulses.  No murmur heard. Pulmonary/Chest: Effort normal. No respiratory distress. She has decreased breath sounds. She has no wheezes. She has rhonchi. She has no rales.  Coarse rhonchi that clear with cough  Lymphadenopathy:    She has no cervical adenopathy.  Skin: Skin is warm and dry. No rash noted.  Nursing note and vitals reviewed.  Results for orders placed or performed in visit on 07/29/18  Brain natriuretic peptide  Result Value Ref Range   Pro B Natriuretic peptide (BNP) 230.0 (H) 0.0 - 100.0 pg/mL  Basic metabolic panel  Result Value Ref Range   Sodium 139 135 - 145 mEq/L   Potassium 4.6 3.5 - 5.1 mEq/L   Chloride 102 96 - 112 mEq/L   CO2 31 19 - 32 mEq/L   Glucose, Bld 120 (H) 70 - 99 mg/dL    BUN 11 6 - 23 mg/dL   Creatinine, Ser 1.85 0.40 - 1.20 mg/dL   Calcium 9.7 8.4 - 63.1 mg/dL   GFR 49.70 (L) >26.37 mL/min   Lab Results  Component Value Date   HGBA1C 6.1 06/15/2018       Assessment & Plan:   Problem List Items Addressed This Visit    Type II diabetes mellitus with renal manifestations (HCC)    Well controlled - she states she has tolerated prednisone well in the past.       COPD exacerbation (HCC) - Primary    COPD exacerbation - treat with agumentin 10d course + prednisone 40mg  5d course. She will continue PRN albuterol nebulizer treatment.s Push fluids and rest (caution with overhydration in CHF  history). Update if not improving with treatment. Pt and caregiver agree.       Relevant Medications   predniSONE (DELTASONE) 20 MG tablet   COPD (chronic obstructive pulmonary disease) (HCC)   Relevant Medications   predniSONE (DELTASONE) 20 MG tablet   Atrial fibrillation with RVR (HCC)    HR elevated - anticipate related to mild dehydration today. Encouraged increased water intake.  Eliquis refilled 30d local supply as she is currently in donut hole       Relevant Medications   apixaban (ELIQUIS) 5 MG TABS tablet       Meds ordered this encounter  Medications  . apixaban (ELIQUIS) 5 MG TABS tablet    Sig: Take 1 tablet (5 mg total) by mouth 2 (two) times daily.    Dispense:  30 tablet    Refill:  3  . amoxicillin-clavulanate (AUGMENTIN) 875-125 MG tablet    Sig: Take 1 tablet by mouth 2 (two) times daily for 10 days.    Dispense:  20 tablet    Refill:  0  . predniSONE (DELTASONE) 20 MG tablet    Sig: Take 2 tablets (40 mg total) by mouth daily with breakfast.    Dispense:  10 tablet    Refill:  0   No orders of the defined types were placed in this encounter.   Follow up plan: Return if symptoms worsen or fail to improve.  Eustaquio Boyden, MD

## 2018-09-03 NOTE — Assessment & Plan Note (Signed)
HR elevated - anticipate related to mild dehydration today. Encouraged increased water intake.  Eliquis refilled 30d local supply as she is currently in donut hole

## 2018-09-03 NOTE — Telephone Encounter (Signed)
Noted  

## 2018-09-04 DIAGNOSIS — J449 Chronic obstructive pulmonary disease, unspecified: Secondary | ICD-10-CM | POA: Diagnosis not present

## 2018-09-04 DIAGNOSIS — J441 Chronic obstructive pulmonary disease with (acute) exacerbation: Secondary | ICD-10-CM | POA: Diagnosis not present

## 2018-09-09 ENCOUNTER — Encounter: Payer: Self-pay | Admitting: Family Medicine

## 2018-09-09 ENCOUNTER — Ambulatory Visit (INDEPENDENT_AMBULATORY_CARE_PROVIDER_SITE_OTHER): Payer: Medicare HMO | Admitting: Family Medicine

## 2018-09-09 VITALS — BP 124/70 | HR 100 | Temp 98.2°F | Ht 62.5 in | Wt 136.8 lb

## 2018-09-09 DIAGNOSIS — J449 Chronic obstructive pulmonary disease, unspecified: Secondary | ICD-10-CM

## 2018-09-09 DIAGNOSIS — I4891 Unspecified atrial fibrillation: Secondary | ICD-10-CM | POA: Diagnosis not present

## 2018-09-09 DIAGNOSIS — J441 Chronic obstructive pulmonary disease with (acute) exacerbation: Secondary | ICD-10-CM | POA: Diagnosis not present

## 2018-09-09 MED ORDER — PREDNISONE 20 MG PO TABS: ORAL_TABLET | ORAL | 0 refills | Status: DC

## 2018-09-09 NOTE — Progress Notes (Signed)
BP 124/70 (BP Location: Right Arm, Patient Position: Sitting, Cuff Size: Normal)   Pulse 100   Temp 98.2 F (36.8 C) (Oral)   Ht 5' 2.5" (1.588 m)   Wt 136 lb 12 oz (62 kg)   SpO2 98% Comment: 2 L, continuous  BMI 24.61 kg/m    CC: f/u visit Subjective:    Patient ID: Dominique Clark, female    DOB: 04/15/1941, 77 y.o.   MRN: [id number]  HPI: Dominique Clark is a 77 y.o. female presenting on 09/09/2018 for Wheezing (States wheezing had worsened about 2 days but now has improved. Pt accompanied by her caregiver, Dominique Clark.)   Known COPD on 3L at home, afib ?CHF on eliquis   See prior note for details.  Seen here last Friday with COPD exacerbation, treated with augmentin 10d course and prednisone 40mg  5d taper. Doxycycline allergy.   She felt well while on prednisone, but over last 2 days noticing increasing wheezing. Persistent productive cough. Cough worse at night.   No fevers. Dyspnea stable.   Last saw pulm Dr Dominique Clark 11/2017.  Continues symbicort and duoneb nebulizer 3-4 times a day.  Was unable to afford trelegy inhaler.   Relevant past medical, surgical, family and social history reviewed and updated as indicated. Interim medical history since our last visit reviewed. Allergies and medications reviewed and updated. Outpatient Medications Prior to Visit  Medication Sig Dispense Refill  . albuterol (PROVENTIL HFA;VENTOLIN HFA) 108 (90 Base) MCG/ACT inhaler Inhale 1-2 puffs into the lungs every 4 (four) hours as needed for wheezing. Okay to fill with ventolin or proventil if needed. 3 Inhaler 3  . amoxicillin-clavulanate (AUGMENTIN) 875-125 MG tablet Take 1 tablet by mouth 2 (two) times daily for 10 days. 20 tablet 0  . apixaban (ELIQUIS) 5 MG TABS tablet Take 1 tablet (5 mg total) by mouth 2 (two) times daily. 30 tablet 3  . atorvastatin (LIPITOR) 20 MG tablet Take 1 tablet (20 mg total) by mouth daily. 90 tablet 3  . budesonide-formoterol (SYMBICORT) 160-4.5 MCG/ACT  inhaler INHALE 2 PUFFS EVERY 12 HOURS TO PREVENTCOUGH OR WHEEZING--RINSE, GARGLE, & SPITAFTER EACH USE. (Patient taking differently: Inhale 2 puffs into the lungs every 12 (twelve) hours. TO PREVENT COUGH OR WHEEZING--RINSE, GARGLE, & SPIT AFTER EACH USE.) 3 Inhaler 3  . cholecalciferol (VITAMIN D) 1000 units tablet Take 1,000 Units by mouth daily.    Marland Kitchen diltiazem (CARDIZEM CD) 120 MG 24 hr capsule Take 1 capsule (120 mg total) by mouth at bedtime. 90 capsule 3  . docusate sodium (COLACE) 100 MG capsule Take 1 capsule (100 mg total) by mouth 2 (two) times daily. 100 capsule 3  . esomeprazole (NEXIUM) 20 MG capsule Take 20 mg by mouth daily at 12 noon.    . ferrous sulfate 325 (65 FE) MG tablet Take 1 tablet (325 mg total) by mouth 2 (two) times daily with a meal. 180 tablet 3  . fluticasone (FLONASE) 50 MCG/ACT nasal spray Place 2 sprays into both nostrils daily as needed (seasonal allergies).    . furosemide (LASIX) 20 MG tablet Take 1 tablet (20 mg total) by mouth daily as needed for fluid (take with potassium). 30 tablet 1  . ipratropium-albuterol (DUONEB) 0.5-2.5 (3) MG/3ML SOLN Take 3 mLs by nebulization every 4 (four) hours as needed. Dx:J44.9 360 mL 1  . metoprolol succinate (TOPROL-XL) 50 MG 24 hr tablet Take 1 tablet (50 mg total) by mouth daily. Take with or immediately following a meal. 90 tablet  3  . NON FORMULARY Oxygen 3 liters 24/7    . potassium chloride (K-DUR) 10 MEQ tablet Take 1 tablet (10 mEq total) by mouth daily as needed (take with lasix). 30 tablet 1  . traMADol (ULTRAM) 50 MG tablet Take 1-2 tablets (50-100 mg total) by mouth every 12 (twelve) hours as needed. 180 tablet 1  . traZODone (DESYREL) 50 MG tablet Take 0.5-1 tablets (25-50 mg total) by mouth at bedtime as needed for sleep. 30 tablet 3  . predniSONE (DELTASONE) 20 MG tablet Take 2 tablets (40 mg total) by mouth daily with breakfast. 10 tablet 0   No facility-administered medications prior to visit.      Per HPI  unless specifically indicated in ROS section below Review of Systems     Objective:    BP 124/70 (BP Location: Right Arm, Patient Position: Sitting, Cuff Size: Normal)   Pulse 100   Temp 98.2 F (36.8 C) (Oral)   Ht 5' 2.5" (1.588 m)   Wt 136 lb 12 oz (62 kg)   SpO2 98% Comment: 2 L, continuous  BMI 24.61 kg/m   Wt Readings from Last 3 Encounters:  09/09/18 136 lb 12 oz (62 kg)  09/03/18 134 lb 12 oz (61.1 kg)  06/28/18 135 lb 12.8 oz (61.6 kg)    Physical Exam  Constitutional: She appears well-developed and well-nourished. No distress.  Supplemental Ox in place via [place]  HENT:  Mouth/Throat: Oropharynx is clear and moist. No oropharyngeal exudate.  Cardiovascular: Normal heart sounds. An irregularly irregular rhythm present. Tachycardia present.  No murmur heard. Pulmonary/Chest: Effort normal. No respiratory distress. She has decreased breath sounds (mild). She has wheezes (coarse throughout). She has no rhonchi. She has no rales.  Musculoskeletal: She exhibits edema (chronic, woody).  Psychiatric: She has a normal mood and affect.  Nursing note and vitals reviewed.   Lab Results  Component Value Date   HGBA1C 6.1 06/15/2018       Assessment & Plan:   Problem List Items Addressed This Visit    COPD exacerbation (HCC) - Primary    COPD exacerbation, slow to recover. Anticipate augmentin working well, will not change abx. Will extend prednisone taper for another 6 days - sent to pharmacy. Pt agrees with plan. Update if not improving with treatment.       Relevant Medications   predniSONE (DELTASONE) 20 MG tablet   COPD (chronic obstructive pulmonary disease) (HCC)    Encouraged pulm f/u as overdue.       Relevant Medications   predniSONE (DELTASONE) 20 MG tablet   Atrial fibrillation with RVR (HCC)    Encouraged cards f/u - she is overdue.           Meds ordered this encounter  Medications  . predniSONE (DELTASONE) 20 MG tablet    Sig: Take two tablets  daily for 2 days followed by one tablet daily for 4 days    Dispense:  8 tablet    Refill:  0   No orders of the defined types were placed in this encounter.   Follow up plan: No follow-ups on file.  Dominique BoydenJavier Tosha Belgarde, MD

## 2018-09-09 NOTE — Assessment & Plan Note (Signed)
Encouraged pulm f/u as overdue.

## 2018-09-09 NOTE — Patient Instructions (Signed)
We can do another short course of prednisone. Finish antibiotics.  Continue scheduled duonebs every 4-6 hours for next few days. Continue symbicort.  Follow up with lung doctor and heart doctor - as you're overdue.

## 2018-09-09 NOTE — Assessment & Plan Note (Signed)
Encouraged cards f/u - she is overdue.

## 2018-09-09 NOTE — Assessment & Plan Note (Signed)
COPD exacerbation, slow to recover. Anticipate augmentin working well, will not change abx. Will extend prednisone taper for another 6 days - sent to pharmacy. Pt agrees with plan. Update if not improving with treatment.

## 2018-09-11 ENCOUNTER — Other Ambulatory Visit: Payer: Self-pay | Admitting: Internal Medicine

## 2018-09-15 DIAGNOSIS — N183 Chronic kidney disease, stage 3 (moderate): Secondary | ICD-10-CM | POA: Diagnosis not present

## 2018-09-15 DIAGNOSIS — I129 Hypertensive chronic kidney disease with stage 1 through stage 4 chronic kidney disease, or unspecified chronic kidney disease: Secondary | ICD-10-CM | POA: Diagnosis not present

## 2018-09-15 DIAGNOSIS — E1122 Type 2 diabetes mellitus with diabetic chronic kidney disease: Secondary | ICD-10-CM | POA: Diagnosis not present

## 2018-09-15 DIAGNOSIS — M8580 Other specified disorders of bone density and structure, unspecified site: Secondary | ICD-10-CM | POA: Diagnosis not present

## 2018-09-15 DIAGNOSIS — J441 Chronic obstructive pulmonary disease with (acute) exacerbation: Secondary | ICD-10-CM | POA: Diagnosis not present

## 2018-09-15 DIAGNOSIS — I4891 Unspecified atrial fibrillation: Secondary | ICD-10-CM | POA: Diagnosis not present

## 2018-09-15 DIAGNOSIS — K219 Gastro-esophageal reflux disease without esophagitis: Secondary | ICD-10-CM | POA: Diagnosis not present

## 2018-09-15 DIAGNOSIS — J9621 Acute and chronic respiratory failure with hypoxia: Secondary | ICD-10-CM | POA: Diagnosis not present

## 2018-09-15 DIAGNOSIS — D649 Anemia, unspecified: Secondary | ICD-10-CM | POA: Diagnosis not present

## 2018-09-27 DIAGNOSIS — M9903 Segmental and somatic dysfunction of lumbar region: Secondary | ICD-10-CM | POA: Diagnosis not present

## 2018-09-27 DIAGNOSIS — S335XXA Sprain of ligaments of lumbar spine, initial encounter: Secondary | ICD-10-CM | POA: Diagnosis not present

## 2018-09-28 DIAGNOSIS — J441 Chronic obstructive pulmonary disease with (acute) exacerbation: Secondary | ICD-10-CM | POA: Diagnosis not present

## 2018-09-29 DIAGNOSIS — S335XXA Sprain of ligaments of lumbar spine, initial encounter: Secondary | ICD-10-CM | POA: Diagnosis not present

## 2018-09-29 DIAGNOSIS — M9903 Segmental and somatic dysfunction of lumbar region: Secondary | ICD-10-CM | POA: Diagnosis not present

## 2018-10-01 ENCOUNTER — Ambulatory Visit: Payer: Medicare HMO | Admitting: Family Medicine

## 2018-10-04 DIAGNOSIS — J441 Chronic obstructive pulmonary disease with (acute) exacerbation: Secondary | ICD-10-CM | POA: Diagnosis not present

## 2018-10-04 DIAGNOSIS — J449 Chronic obstructive pulmonary disease, unspecified: Secondary | ICD-10-CM | POA: Diagnosis not present

## 2018-10-18 ENCOUNTER — Ambulatory Visit (INDEPENDENT_AMBULATORY_CARE_PROVIDER_SITE_OTHER): Payer: Medicare HMO | Admitting: Family Medicine

## 2018-10-18 ENCOUNTER — Encounter: Payer: Self-pay | Admitting: Family Medicine

## 2018-10-18 VITALS — BP 122/80 | HR 142 | Temp 97.9°F | Ht 62.5 in

## 2018-10-18 DIAGNOSIS — E1129 Type 2 diabetes mellitus with other diabetic kidney complication: Secondary | ICD-10-CM

## 2018-10-18 DIAGNOSIS — J441 Chronic obstructive pulmonary disease with (acute) exacerbation: Secondary | ICD-10-CM | POA: Diagnosis not present

## 2018-10-18 LAB — CBC WITH DIFFERENTIAL/PLATELET
BASOS PCT: 0.9 % (ref 0.0–3.0)
Basophils Absolute: 0.1 10*3/uL (ref 0.0–0.1)
Eosinophils Absolute: 0.1 10*3/uL (ref 0.0–0.7)
Eosinophils Relative: 1 % (ref 0.0–5.0)
HEMATOCRIT: 37.5 % (ref 36.0–46.0)
Hemoglobin: 12.2 g/dL (ref 12.0–15.0)
LYMPHS PCT: 15.3 % (ref 12.0–46.0)
Lymphs Abs: 1.4 10*3/uL (ref 0.7–4.0)
MCHC: 32.5 g/dL (ref 30.0–36.0)
MCV: 94.5 fl (ref 78.0–100.0)
Monocytes Absolute: 0.8 10*3/uL (ref 0.1–1.0)
Monocytes Relative: 9.1 % (ref 3.0–12.0)
NEUTROS ABS: 6.7 10*3/uL (ref 1.4–7.7)
Neutrophils Relative %: 73.7 % (ref 43.0–77.0)
PLATELETS: 240 10*3/uL (ref 150.0–400.0)
RBC: 3.97 Mil/uL (ref 3.87–5.11)
RDW: 14 % (ref 11.5–15.5)
WBC: 9.1 10*3/uL (ref 4.0–10.5)

## 2018-10-18 LAB — BASIC METABOLIC PANEL
BUN: 13 mg/dL (ref 6–23)
CHLORIDE: 102 meq/L (ref 96–112)
CO2: 31 meq/L (ref 19–32)
Calcium: 9.5 mg/dL (ref 8.4–10.5)
Creatinine, Ser: 0.97 mg/dL (ref 0.40–1.20)
GFR: 59 mL/min — ABNORMAL LOW (ref >60.00)
Glucose, Bld: 119 mg/dL — ABNORMAL HIGH (ref 70–99)
POTASSIUM: 3.8 meq/L (ref 3.5–5.1)
SODIUM: 141 meq/L (ref 135–145)

## 2018-10-18 LAB — HEMOGLOBIN A1C: Hgb A1c MFr Bld: 6.2 % (ref 4.6–6.5)

## 2018-10-18 MED ORDER — AMOXICILLIN-POT CLAVULANATE 875-125 MG PO TABS: 1.0000 | ORAL_TABLET | Freq: Two times a day (BID) | ORAL | 0 refills | Status: DC

## 2018-10-18 MED ORDER — PREDNISONE 20 MG PO TABS: ORAL_TABLET | ORAL | 0 refills | Status: DC

## 2018-10-18 NOTE — Patient Instructions (Signed)
If your pulse stays above 120 after the albuterol wears off, or if you feel worse, then go to the ER.  Go to the lab on the way out.  We'll contact you with your lab report. Start prednisone.  Start augmentin.  Update me as needed.   Take care.  Glad to see you.

## 2018-10-18 NOTE — Progress Notes (Signed)
Sick for about 2 days.  Cough, wheeze, SOB, discolored sputum.  Greenish sputum. No fevers.  No vomiting.  Using O2 at baseline.   Still on eliquis.  Still taking diltiazem 120mg  a day along with metoprolol 50mg  a day.    She has had some back pain since doing some lifting at home.  Some better in the meantime. Taking tramadol for pain.  Some relief from med.   Not checking sugar at home.  Last A1c was controlled.   Meds, vitals, and allergies reviewed.   ROS: Per HPI unless specifically indicated in ROS section   nad On O2.  Speaking in complete sentences. Mucous membranes moist Neck supple, no lymphadenopathy, Skin well perfused. IRR, tachy.   No wheeze noted.  She has equal breath sounds in the lungs bilaterally.  No focal decrease in breath sounds, but her overall bilateral breath sounds are slightly decreased from normal. Soft nontender, normal bowel sounds No edema.

## 2018-10-20 NOTE — Assessment & Plan Note (Signed)
Discussed with patient about options.  She does not appear to need hospitalization at this point.  Routine cautions given. Start Augmentin.  Continue to use albuterol as needed. No wheeze currently She had used SABA just prior to OV.  That may have elevated her pulse.  Her pulse has usually been lower, even recently per patient report. Her last A1c was controlled.  Start prednisone with expectation that it will cause some sugar elevation.  This should be manageable. Check routine labs.  Routine emergency room cautions discussed with patient.  See after visit summary.

## 2018-10-29 DIAGNOSIS — J441 Chronic obstructive pulmonary disease with (acute) exacerbation: Secondary | ICD-10-CM | POA: Diagnosis not present

## 2018-11-02 ENCOUNTER — Telehealth: Payer: Self-pay | Admitting: *Deleted

## 2018-11-02 MED ORDER — PREDNISONE 20 MG PO TABS: ORAL_TABLET | ORAL | 0 refills | Status: DC

## 2018-11-02 MED ORDER — AMOXICILLIN-POT CLAVULANATE 875-125 MG PO TABS: 1.0000 | ORAL_TABLET | Freq: Two times a day (BID) | ORAL | 0 refills | Status: DC

## 2018-11-02 NOTE — Telephone Encounter (Signed)
Spoke to pt who states she was seen on 10/21 and given an abx and prednisone, that she finished yesterday. Pt states that she is not feeling any better and is wanting to know how to proceed. Advised pt the previous meds are still in her system and she may feel better in the days to come. Pt is wanting to know if there is anything she can do in the meantime. pls advise

## 2018-11-02 NOTE — Telephone Encounter (Signed)
Patient advised.

## 2018-11-02 NOTE — Telephone Encounter (Signed)
If she is getting worse again, then restart the antibiotics and prednisone and please set up OV.   I sent the rxs.  If she is stable and/or getting some better in the next few days, then don't restart the meds.  Update me as needed.  Thanks.

## 2018-11-04 DIAGNOSIS — J441 Chronic obstructive pulmonary disease with (acute) exacerbation: Secondary | ICD-10-CM | POA: Diagnosis not present

## 2018-11-04 DIAGNOSIS — J449 Chronic obstructive pulmonary disease, unspecified: Secondary | ICD-10-CM | POA: Diagnosis not present

## 2018-11-09 DIAGNOSIS — M5414 Radiculopathy, thoracic region: Secondary | ICD-10-CM | POA: Diagnosis not present

## 2018-11-09 DIAGNOSIS — S335XXA Sprain of ligaments of lumbar spine, initial encounter: Secondary | ICD-10-CM | POA: Diagnosis not present

## 2018-11-09 DIAGNOSIS — M9903 Segmental and somatic dysfunction of lumbar region: Secondary | ICD-10-CM | POA: Diagnosis not present

## 2018-11-09 DIAGNOSIS — M9902 Segmental and somatic dysfunction of thoracic region: Secondary | ICD-10-CM | POA: Diagnosis not present

## 2018-11-28 DIAGNOSIS — J441 Chronic obstructive pulmonary disease with (acute) exacerbation: Secondary | ICD-10-CM | POA: Diagnosis not present

## 2018-12-04 DIAGNOSIS — J449 Chronic obstructive pulmonary disease, unspecified: Secondary | ICD-10-CM | POA: Diagnosis not present

## 2018-12-04 DIAGNOSIS — J441 Chronic obstructive pulmonary disease with (acute) exacerbation: Secondary | ICD-10-CM | POA: Diagnosis not present

## 2018-12-13 ENCOUNTER — Telehealth: Payer: Self-pay

## 2018-12-13 NOTE — Telephone Encounter (Signed)
Pt is having some back problems at the moment and wants to hold off on scheduling for now. Asks for us to contact her later in Jan to reconsider.

## 2018-12-13 NOTE — Telephone Encounter (Signed)
-----   Message from Alexis Frockoni Kemani Heidel V, [place] sent at 11/10/2018  1:29 PM EST ----- Regarding: EGD/colon @hospital  Needs EGD/Colon [facility].

## 2018-12-14 NOTE — Telephone Encounter (Signed)
Noted. Thanks.

## 2018-12-14 NOTE — Telephone Encounter (Signed)
Understood.  Anemia improved on recent labs, so iron appears to be working for her.  I still feel the scopes would be helpful to see if there is an identifiable source of GI blood loss.  We will wait to hear from her.  Best wishes for a happy and healthy new year, hope the back feels better.

## 2018-12-29 DIAGNOSIS — J441 Chronic obstructive pulmonary disease with (acute) exacerbation: Secondary | ICD-10-CM | POA: Diagnosis not present

## 2019-01-04 DIAGNOSIS — J441 Chronic obstructive pulmonary disease with (acute) exacerbation: Secondary | ICD-10-CM | POA: Diagnosis not present

## 2019-01-04 DIAGNOSIS — J449 Chronic obstructive pulmonary disease, unspecified: Secondary | ICD-10-CM | POA: Diagnosis not present

## 2019-01-29 DIAGNOSIS — J441 Chronic obstructive pulmonary disease with (acute) exacerbation: Secondary | ICD-10-CM | POA: Diagnosis not present

## 2019-01-31 ENCOUNTER — Telehealth: Payer: Self-pay | Admitting: Family Medicine

## 2019-01-31 DIAGNOSIS — D649 Anemia, unspecified: Secondary | ICD-10-CM

## 2019-01-31 DIAGNOSIS — E1129 Type 2 diabetes mellitus with other diabetic kidney complication: Secondary | ICD-10-CM

## 2019-01-31 NOTE — Telephone Encounter (Signed)
Appointment scheduled.

## 2019-01-31 NOTE — Telephone Encounter (Signed)
Needs office visit with labs.  Labs ordered.  Thanks.

## 2019-02-01 ENCOUNTER — Ambulatory Visit: Payer: Medicare HMO | Admitting: Family Medicine

## 2019-02-02 ENCOUNTER — Other Ambulatory Visit: Payer: Self-pay | Admitting: Internal Medicine

## 2019-02-03 ENCOUNTER — Encounter: Payer: Self-pay | Admitting: Family Medicine

## 2019-02-03 ENCOUNTER — Ambulatory Visit (INDEPENDENT_AMBULATORY_CARE_PROVIDER_SITE_OTHER): Payer: Medicare HMO | Admitting: Family Medicine

## 2019-02-03 VITALS — BP 114/78 | HR 52 | Temp 97.8°F | Ht 62.5 in

## 2019-02-03 DIAGNOSIS — Z8639 Personal history of other endocrine, nutritional and metabolic disease: Secondary | ICD-10-CM

## 2019-02-03 DIAGNOSIS — D649 Anemia, unspecified: Secondary | ICD-10-CM | POA: Diagnosis not present

## 2019-02-03 DIAGNOSIS — E785 Hyperlipidemia, unspecified: Secondary | ICD-10-CM

## 2019-02-03 DIAGNOSIS — D509 Iron deficiency anemia, unspecified: Secondary | ICD-10-CM | POA: Diagnosis not present

## 2019-02-03 DIAGNOSIS — M545 Low back pain, unspecified: Secondary | ICD-10-CM

## 2019-02-03 DIAGNOSIS — I5033 Acute on chronic diastolic (congestive) heart failure: Secondary | ICD-10-CM

## 2019-02-03 DIAGNOSIS — G4700 Insomnia, unspecified: Secondary | ICD-10-CM

## 2019-02-03 DIAGNOSIS — E1129 Type 2 diabetes mellitus with other diabetic kidney complication: Secondary | ICD-10-CM | POA: Diagnosis not present

## 2019-02-03 DIAGNOSIS — J9621 Acute and chronic respiratory failure with hypoxia: Secondary | ICD-10-CM | POA: Diagnosis not present

## 2019-02-03 MED ORDER — PREDNISONE 20 MG PO TABS: ORAL_TABLET | ORAL | 0 refills | Status: DC

## 2019-02-03 MED ORDER — AMOXICILLIN-POT CLAVULANATE 875-125 MG PO TABS: 1.0000 | ORAL_TABLET | Freq: Two times a day (BID) | ORAL | 0 refills | Status: DC

## 2019-02-03 MED ORDER — APIXABAN 5 MG PO TABS: 5.0000 mg | ORAL_TABLET | Freq: Two times a day (BID) | ORAL | 3 refills | Status: DC

## 2019-02-03 MED ORDER — TRAMADOL HCL 50 MG PO TABS: 50.0000 mg | ORAL_TABLET | Freq: Two times a day (BID) | ORAL | 2 refills | Status: DC | PRN

## 2019-02-03 MED ORDER — ATORVASTATIN CALCIUM 20 MG PO TABS: 20.0000 mg | ORAL_TABLET | Freq: Every day | ORAL | 3 refills | Status: DC

## 2019-02-03 NOTE — Patient Instructions (Signed)
Add on 1-2 extra strength tylenol when you take tramadol.  Go to the lab on the way out.  We'll contact you with your lab report. Restart prednisone and augmentin.  Take care.  Glad to see you.

## 2019-02-03 NOTE — Progress Notes (Signed)
She is not used Lasix or potassium recently.  When she takes 1 she takes the other.  Prev used them only PRN.   Trazodone used prn for sleep.  Usually sleeping okay o/w, usually 1/2 tab prn.    Tramadol prn for back pain, with some relief.  Lower midline back pain.  No radicular pain.  Walking limited from back pain.  Not yet taking Tylenol for pain.  She is due for cards f/u, needs eye clinic f/u.  She had to put off due to back pain and occ diarrhea.   H/o hyperglycemia.  No DM2 meds.  Due for f/u labs.    Still on O2 at baseline.  Some occ wheeze.  Green sputum in the last few weeks.  Overall isn't getting better.  Still on baseline meds with inc sputum clearance. No fevers.    History of iron deficiency anemia.  Due for follow-up labs.  Still on iron at baseline.  Still on PPI.  Still anticoagulated.  No gross blood in stool.  PMH and SH reviewed  ROS: Per HPI unless specifically indicated in ROS section   Meds, vitals, and allergies reviewed.   GEN: nad, alert and oriented, on O2 at baseline HEENT: mucous membranes moist NECK: supple w/o LA CV: rrr.  no murmur PULM: ctab, no inc wob ABD: soft, +bs EXT: no edema SKIN: no acute rash

## 2019-02-04 DIAGNOSIS — J449 Chronic obstructive pulmonary disease, unspecified: Secondary | ICD-10-CM | POA: Diagnosis not present

## 2019-02-04 DIAGNOSIS — J441 Chronic obstructive pulmonary disease with (acute) exacerbation: Secondary | ICD-10-CM | POA: Diagnosis not present

## 2019-02-04 LAB — CBC WITH DIFFERENTIAL/PLATELET
Basophils Absolute: 0.1 10*3/uL (ref 0.0–0.1)
Basophils Relative: 1.2 % (ref 0.0–3.0)
Eosinophils Absolute: 0.4 10*3/uL (ref 0.0–0.7)
Eosinophils Relative: 4.5 % (ref 0.0–5.0)
HCT: 34.6 % — ABNORMAL LOW (ref 36.0–46.0)
Hemoglobin: 11.1 g/dL — ABNORMAL LOW (ref 12.0–15.0)
Lymphocytes Relative: 16.1 % (ref 12.0–46.0)
Lymphs Abs: 1.6 10*3/uL (ref 0.7–4.0)
MCHC: 32.2 g/dL (ref 30.0–36.0)
MCV: 95.2 fl (ref 78.0–100.0)
Monocytes Absolute: 0.8 10*3/uL (ref 0.1–1.0)
Monocytes Relative: 8.2 % (ref 3.0–12.0)
Neutro Abs: 7.1 10*3/uL (ref 1.4–7.7)
Neutrophils Relative %: 70 % (ref 43.0–77.0)
Platelets: 279 10*3/uL (ref 150.0–400.0)
RBC: 3.63 Mil/uL — AB (ref 3.87–5.11)
RDW: 13.7 % (ref 11.5–15.5)
WBC: 10.1 10*3/uL (ref 4.0–10.5)

## 2019-02-04 LAB — LIPID PANEL
Cholesterol: 102 mg/dL (ref 0–200)
HDL: 47.3 mg/dL (ref >39.00)
LDL Cholesterol: 42 mg/dL (ref 0–99)
NonHDL: 54.64
Total CHOL/HDL Ratio: 2
Triglycerides: 61 mg/dL (ref 0.0–149.0)
VLDL: 12.2 mg/dL (ref 0.0–40.0)

## 2019-02-04 LAB — IBC PANEL
Iron: 48 ug/dL (ref 42–145)
Saturation Ratios: 14.3 % — ABNORMAL LOW (ref 20.0–50.0)
Transferrin: 239 mg/dL (ref 212.0–360.0)

## 2019-02-04 LAB — HEMOGLOBIN A1C: Hgb A1c MFr Bld: 6 % (ref 4.6–6.5)

## 2019-02-06 DIAGNOSIS — M545 Low back pain, unspecified: Secondary | ICD-10-CM | POA: Insufficient documentation

## 2019-02-06 DIAGNOSIS — G4700 Insomnia, unspecified: Secondary | ICD-10-CM | POA: Insufficient documentation

## 2019-02-06 NOTE — Assessment & Plan Note (Signed)
Trazodone used prn for sleep.  Usually sleeping okay o/w, usually 1/2 tab prn.

## 2019-02-06 NOTE — Assessment & Plan Note (Addendum)
No acute changes.  No new weakness.  Given her baseline situation is likely reasonable to continue tramadol and add on Tylenol as needed and update me as needed.  She agrees. >25 minutes spent in face to face time with patient, >50% spent in counselling or coordination of care.

## 2019-02-06 NOTE — Assessment & Plan Note (Signed)
No medications currently.  Recheck A1c today.

## 2019-02-06 NOTE — Assessment & Plan Note (Signed)
History of.  Still anticoagulated and on PPI and iron.  Reasonable to continue for now.  See notes on labs.  Okay for outpatient follow-up.

## 2019-02-06 NOTE — Assessment & Plan Note (Signed)
She has chronic hypoxia and continues on O2 at baseline.  Reasonable to continue for now.  Given the recent increase in sputum that is discolored along with wheeze then reasonable to restart Augmentin and prednisone with routine cautions.  She agrees.  Still okay for outpatient follow-up.

## 2019-02-06 NOTE — Assessment & Plan Note (Signed)
History of CHF.  Appears euvolemic. She is not used Lasix or potassium recently.  When she takes 1 she takes the other.  Prev used them only PRN.

## 2019-02-24 ENCOUNTER — Telehealth: Payer: Self-pay

## 2019-02-24 MED ORDER — AMOXICILLIN-POT CLAVULANATE 875-125 MG PO TABS: 1.0000 | ORAL_TABLET | Freq: Two times a day (BID) | ORAL | 0 refills | Status: DC

## 2019-02-24 MED ORDER — PREDNISONE 20 MG PO TABS: ORAL_TABLET | ORAL | 0 refills | Status: DC

## 2019-02-24 NOTE — Telephone Encounter (Signed)
I sent the meds.  If not better then needs recheck.  Thanks.

## 2019-02-24 NOTE — Telephone Encounter (Signed)
Spoke with patient, she expressed understanding. Nothing further needed.

## 2019-02-24 NOTE — Telephone Encounter (Signed)
Patient calls to report ongoing symptoms and request a refill of prednisone and abx  She finished meds approximately 1.5 weeks ago.  States she usually requires 2 rounds before she gets better.    Current Continuing Sx:  Cough prod with green sputum Wheezing/ongoing SOB  Oxygen level 97% on 3L of oxygen  No Palpitations  Afebrile Chest soreness from frequent coughing   Does not note any increase in swelling/edema to her extremities.  She is using her nebulizer which helps her expectorate mucous.    She has not made f/u appointment with cardiologist per recommendation at last office visit.  And does not have any follow up planned with her pulmonologist.   Pharmacy to send in meds:  [facility].

## 2019-02-27 DIAGNOSIS — J441 Chronic obstructive pulmonary disease with (acute) exacerbation: Secondary | ICD-10-CM | POA: Diagnosis not present

## 2019-03-05 DIAGNOSIS — J441 Chronic obstructive pulmonary disease with (acute) exacerbation: Secondary | ICD-10-CM | POA: Diagnosis not present

## 2019-03-05 DIAGNOSIS — J449 Chronic obstructive pulmonary disease, unspecified: Secondary | ICD-10-CM | POA: Diagnosis not present

## 2019-03-30 DIAGNOSIS — J441 Chronic obstructive pulmonary disease with (acute) exacerbation: Secondary | ICD-10-CM | POA: Diagnosis not present

## 2019-04-11 ENCOUNTER — Telehealth: Payer: Self-pay | Admitting: Family Medicine

## 2019-04-11 NOTE — Telephone Encounter (Signed)
Elease Hashimoto called back and asked to use [facility] for oxygen due to [facility]- they are in contract.

## 2019-04-11 NOTE — Telephone Encounter (Signed)
Best number [phone] Arie Sabina (niece) called stating dr Para March needs to do prior autho for [facility] and April for pt oxygen  [facility] is out of network.   And please order  oxygen to an in network provider.

## 2019-04-11 NOTE — Telephone Encounter (Signed)
Please make the change and start the PA if needed.  Thanks.

## 2019-04-14 ENCOUNTER — Telehealth: Payer: Self-pay

## 2019-04-14 NOTE — Telephone Encounter (Signed)
Inbound call to triage - patient reports urinary symptoms of burning and inability to urinate.   Patient is going to attempt to provide a urine speciment in early morning on 04/15/19. Daughter Lupita Leash will bring over the sample.   Phone visit scheduled 04/15/19 @ 1415. Unable to complete virtual video appointment.

## 2019-04-15 ENCOUNTER — Ambulatory Visit (INDEPENDENT_AMBULATORY_CARE_PROVIDER_SITE_OTHER): Payer: Medicare HMO | Admitting: Family Medicine

## 2019-04-15 ENCOUNTER — Telehealth: Payer: Self-pay | Admitting: Family Medicine

## 2019-04-15 ENCOUNTER — Telehealth: Payer: Self-pay

## 2019-04-15 DIAGNOSIS — J449 Chronic obstructive pulmonary disease, unspecified: Secondary | ICD-10-CM

## 2019-04-15 DIAGNOSIS — R300 Dysuria: Secondary | ICD-10-CM

## 2019-04-15 LAB — POC URINALSYSI DIPSTICK (AUTOMATED)
Bilirubin, UA: NEGATIVE
Glucose, UA: NEGATIVE
Ketones, UA: NEGATIVE
Nitrite, UA: POSITIVE
Protein, UA: POSITIVE — AB
Spec Grav, UA: 1.02 (ref 1.010–1.025)
Urobilinogen, UA: 0.2 E.U./dL
pH, UA: 6 (ref 5.0–8.0)

## 2019-04-15 MED ORDER — SULFAMETHOXAZOLE-TRIMETHOPRIM 400-80 MG PO TABS: 1.0000 | ORAL_TABLET | Freq: Two times a day (BID) | ORAL | 0 refills | Status: DC

## 2019-04-15 MED ORDER — PREDNISONE 20 MG PO TABS: ORAL_TABLET | ORAL | 0 refills | Status: DC

## 2019-04-15 MED ORDER — AMOXICILLIN-POT CLAVULANATE 875-125 MG PO TABS: 1.0000 | ORAL_TABLET | Freq: Two times a day (BID) | ORAL | 0 refills | Status: DC

## 2019-04-15 NOTE — Telephone Encounter (Signed)
Please see what details you can get.  As best I can tell, this letter doesn't list what was denied or why.  The insurance company was unhelpful in this situation.  Thanks.

## 2019-04-15 NOTE — Telephone Encounter (Signed)
Pt's son dropped off letter regarding patient's oxygen. Placed in RX tower.

## 2019-04-15 NOTE — Telephone Encounter (Signed)
Late entry.  Patient had urinary symptoms but not urinary retention and she is still okay for outpatient follow-up.  See follow-up notes.

## 2019-04-15 NOTE — Telephone Encounter (Signed)
This has been completed.

## 2019-04-15 NOTE — Telephone Encounter (Signed)
[facility] Night - Client Nonclinical Telephone Record [facility] Call Center Client [facility] Night - Client Client Site [facility] - Night Physician Raechel Ache - MD Contact Type Call Who Is Calling Patient / Member / Family / Caregiver Caller Name Kaimi Chrestman Caller Phone Number [phone] Patient Name Dominique Clark Patient DOB 1941/10/07 Call Type Message Only Information Provided Reason for Call Request for General Office Information Initial Comment Caller states she is supposed to give specimen to the office, sending brother to deliver it. Additional Comment Provided information for the office. Call Closed By: Kendall Flack Transaction Date/Time: 04/15/2019 7:49:53 AM (ET)

## 2019-04-15 NOTE — Progress Notes (Signed)
Interactive audio and video telecommunications were attempted between this provider and patient, however failed, due to patient having technical difficulties OR patient did not have access to video capability.  We continued and completed visit with audio only.   Virtual Visit via Telephone Note  I connected with patient on 04/15/19 at 2:17 PM by telephone and verified that I am speaking with the correct person using two identifiers.  Location of patient: home   [facility] Name of referring provider (if blank then none associated): Names per persons and role in encounter:  MD: Ferd Hibbs, Patient: name listed above.    I discussed the limitations, risks, security and privacy concerns of performing an evaluation and management service by telephone and the availability of in person appointments. I also discussed with the patient that there may be a patient responsible charge related to this service. The patient expressed understanding and agreed to proceed.  History of Present Illness:  Dysuria.  Not burning but still has pain with urination. No discharge.  No bleeding.  No fevers.  No vomiting.  No abd pain. U/a d/w pt.  Sx started about 1 week ago.    She is on O2 at baseline.  She got a letter from [employer] and we are checking on that.  She has some wheeze and some occ discolored sputum.  D/w pt about options, ie hold pred and augmentin in the meantime, to start if worse.    We talked about pandemic consideration.  She has help with groceries, etc.    No recent need or use of lasix.     Observations/Objective:nad Speech wnl  Assessment and Plan:  COPD. Hold augmentin and prednisone for now, use if progressive sx.  She agrees.   Routine cautions d/w pt.    Urinary sx. ucx pending start septra for likely UTI.  She agrees.    She has f/u pending for 05/2019.  Keep as scheduled for now.   Follow Up Instructions: see above.     I discussed the  assessment and treatment plan with the patient. The patient was provided an opportunity to ask questions and all were answered. The patient agreed with the plan and demonstrated an understanding of the instructions.   The patient was advised to call back or seek an in-person evaluation if the symptoms worsen or if the condition fails to improve as anticipated.  I provided 18 minutes of non-face-to-face time during this encounter.  Crawford Givens, MD

## 2019-04-15 NOTE — Telephone Encounter (Signed)
Placed in Dr Lianne Bushy inbox to review.

## 2019-04-17 DIAGNOSIS — R300 Dysuria: Secondary | ICD-10-CM | POA: Insufficient documentation

## 2019-04-17 LAB — URINE CULTURE
MICRO NUMBER:: 403569
SPECIMEN QUALITY:: ADEQUATE

## 2019-04-17 NOTE — Assessment & Plan Note (Signed)
Hold augmentin and prednisone for now, use if progressive sx.  She agrees.   Routine cautions d/w pt.

## 2019-04-17 NOTE — Assessment & Plan Note (Signed)
ucx pending start septra for likely UTI.  She agrees.  Routine cautions given to patient.  See notes on labs.

## 2019-04-19 ENCOUNTER — Telehealth: Payer: Self-pay | Admitting: Family Medicine

## 2019-04-19 NOTE — Telephone Encounter (Signed)
See result note.  

## 2019-04-19 NOTE — Telephone Encounter (Signed)
LMTCB regarding   results/recommendations. Tried calling twice,still no answer.

## 2019-04-19 NOTE — Telephone Encounter (Signed)
Patient returned call about her lab results. °

## 2019-04-20 NOTE — Telephone Encounter (Signed)
Please talk to me about this at clinic tomorrow.  I appreciate you checking on this in the meantime.

## 2019-04-20 NOTE — Telephone Encounter (Signed)
I have called [employer] about the claims summary.   I was told that the claim on 02/27/2019, 03/05/2019, and 03/30/2019 are out of network. Also need referral place for these claims with the listed ICD 10 codes.  However, I asked for details for these claims. I did not get a clear answer. It did not seem the rep knew anything or could look up anything for me. The ICD 10 codes did not come up when I tried looking them up myself.  Referral for the call is [id number]

## 2019-04-22 NOTE — Telephone Encounter (Signed)
Spoke with [name] and she thought Dominique Clark maybe had this taking care of already. Please review Dominique Clark. This was from 04/11/2019.

## 2019-04-25 NOTE — Telephone Encounter (Signed)
I have not done anything with this. I do not know what needs to be done.

## 2019-04-29 DIAGNOSIS — J441 Chronic obstructive pulmonary disease with (acute) exacerbation: Secondary | ICD-10-CM | POA: Diagnosis not present

## 2019-04-29 NOTE — Telephone Encounter (Signed)
Dominique Clark is taking care of this. Routing to [place] to follow up when and as she can

## 2019-05-06 ENCOUNTER — Telehealth: Payer: Self-pay

## 2019-05-06 NOTE — Telephone Encounter (Signed)
Virtual Visit Pre-Appointment Phone Call  "Dominique Clark, I am calling you today to discuss your upcoming appointment. We are currently trying to limit exposure to the virus that causes COVID-19 by seeing patients at home rather than in the office."  1. "What is the BEST phone number to call the day of the visit?" - include this in appointment notes  2. Do you have or have access to (through a family member/friend) a smartphone with video capability that we can use for your visit?" a. If yes - list this number in appt notes as cell (if different from BEST phone #) and list the appointment type as a VIDEO visit in appointment notes b. If no - list the appointment type as a PHONE visit in appointment notes  3. Confirm consent - "In the setting of the current Covid19 crisis, you are scheduled for a phone visit with your provider on May 12, 2019 at 10:40AM.  Just as we do with many in-office visits, in order for you to participate in this visit, we must obtain consent.  If you'd like, I can send this to your mychart (if signed up) or email for you to review.  Otherwise, I can obtain your verbal consent now.  All virtual visits are billed to your insurance company just like a normal visit would be.  By agreeing to a virtual visit, we'd like you to understand that the technology does not allow for your provider to perform an examination, and thus may limit your provider's ability to fully assess your condition. If your provider identifies any concerns that need to be evaluated in person, we will make arrangements to do so.  Finally, though the technology is pretty good, we cannot assure that it will always work on either your or our end, and in the setting of a video visit, we may have to convert it to a phone-only visit.  In either situation, we cannot ensure that we have a secure connection.  Are you willing to proceed?" STAFF: Did the patient verbally acknowledge consent to telehealth visit? Document YES/NO  here: YES  4. Advise patient to be prepared - "Two hours prior to your appointment, go ahead and check your blood pressure, pulse, oxygen saturation, and your weight (if you have the equipment to check those) and write them all down. When your visit starts, your provider will ask you for this information. If you have an Apple Watch or Kardia device, please plan to have heart rate information ready on the day of your appointment. Please have a pen and paper handy nearby the day of the visit as well."  5. Give patient instructions for MyChart download to smartphone OR [email or web address] as below if video visit (depending on what platform provider is using)  6. Inform patient they will receive a phone call 15 minutes prior to their appointment time (may be from unknown caller ID) so they should be prepared to answer    TELEPHONE CALL NOTE  Dominique Clark has been deemed a candidate for a follow-up tele-health visit to limit community exposure during the Covid-19 pandemic. I spoke with the patient via phone to ensure availability of phone/video source, confirm preferred email & phone number, and discuss instructions and expectations.  I reminded Dominique Clark to be prepared with any vital sign and/or heart rhythm information that could potentially be obtained via home monitoring, at the time of her visit. I reminded Dominique Clark to expect a phone call prior to  her visit.  Tommie Sams McClain 05/06/2019 1:52 PM    FULL LENGTH CONSENT FOR TELE-HEALTH VISIT   I hereby voluntarily request, consent and authorize [facility] and its employed or contracted physicians, physician assistants, nurse practitioners or other licensed health care professionals (the Practitioner), to provide me with telemedicine health care services (the Services") as deemed necessary by the treating Practitioner. I acknowledge and consent to receive the Services by the Practitioner via telemedicine. I understand that the  telemedicine visit will involve communicating with the Practitioner through live audiovisual communication technology and the disclosure of certain medical information by electronic transmission. I acknowledge that I have been given the opportunity to request an in-person assessment or other available alternative prior to the telemedicine visit and am voluntarily participating in the telemedicine visit.  I understand that I have the right to withhold or withdraw my consent to the use of telemedicine in the course of my care at any time, without affecting my right to future care or treatment, and that the Practitioner or I may terminate the telemedicine visit at any time. I understand that I have the right to inspect all information obtained and/or recorded in the course of the telemedicine visit and may receive copies of available information for a reasonable fee.  I understand that some of the potential risks of receiving the Services via telemedicine include:   Delay or interruption in medical evaluation due to technological equipment failure or disruption;  Information transmitted may not be sufficient (e.g. poor resolution of images) to allow for appropriate medical decision making by the Practitioner; and/or   In rare instances, security protocols could fail, causing a breach of personal health information.  Furthermore, I acknowledge that it is my responsibility to provide information about my medical history, conditions and care that is complete and accurate to the best of my ability. I acknowledge that Practitioner's advice, recommendations, and/or decision may be based on factors not within their control, such as incomplete or inaccurate data provided by me or distortions of diagnostic images or specimens that may result from electronic transmissions. I understand that the practice of medicine is not an exact science and that Practitioner makes no warranties or guarantees regarding treatment  outcomes. I acknowledge that I will receive a copy of this consent concurrently upon execution via email to the email address I last provided but may also request a printed copy by calling the office of [facility].    I understand that my insurance will be billed for this visit.   I have read or had this consent read to me.  I understand the contents of this consent, which adequately explains the benefits and risks of the Services being provided via telemedicine.   I have been provided ample opportunity to ask questions regarding this consent and the Services and have had my questions answered to my satisfaction.  I give my informed consent for the services to be provided through the use of telemedicine in my medical care  By participating in this telemedicine visit I agree to the above.

## 2019-05-07 ENCOUNTER — Other Ambulatory Visit: Payer: Self-pay | Admitting: Family Medicine

## 2019-05-12 ENCOUNTER — Telehealth: Payer: Medicare HMO | Admitting: Cardiovascular Disease

## 2019-05-15 NOTE — Progress Notes (Signed)
Virtual Visit via Telephone Note   This visit type was conducted due to national recommendations for restrictions regarding the COVID-19 Pandemic (e.g. social distancing) in an effort to limit this patient's exposure and mitigate transmission in our community.  Due to her co-morbid illnesses, this patient is at least at moderate risk for complications without adequate follow up.  This format is felt to be most appropriate for this patient at this time.  The patient did not have access to video technology/had technical difficulties with video requiring transitioning to audio format only (telephone).  All issues noted in this document were discussed and addressed.  No physical exam could be performed with this format.  Please refer to the patient's chart for her  consent to telehealth for [facility].   I connected with  Dominique Clark on 05/16/19 by a video enabled telemedicine application and verified that I am speaking with the correct person using two identifiers. I discussed the limitations of evaluation and management by telemedicine. The patient expressed understanding and agreed to proceed.   Evaluation Performed:  Follow-up visit  Date:  05/16/2019   ID:  OTA EBERSOLE, DOB [birth year], MRN [id number]  Patient Location:  [address] [place] [address]   Provider location:   [facility], [place] office  PCP:  [name], MD  Cardiologist:  Fonnie Mu   Chief Complaint:  Sob, wheezing, cough    History of Present Illness:    Dominique Clark is a 78 y.o. female who presents via audio/video conferencing for a telehealth visit today.   The patient does not symptoms concerning for COVID-19 infection (fever, chills, cough, or new SHORTNESS OF BREATH).   Patient has a past medical history of COPD, on oxygen Atrial fib TIA CKD In the hospital 11/28/2017  left arm numbness along with slurred speech.   EKG shows atrial fibrillation with RVR.,   She converted on her own to normal sinus rhythm Eliquis 5 mg twice a day She presents for follow-up of her paroxysmal atrial fibrillation, TIA  eliquis expensive Chest hurts when she coughs a lot  Denies episodes of tachycardia at home, measures this using her pulse oximeter When she has atrial fibrillation she has worsening shortness of breath  Prednisone and ABX in 01/2019 Did not take them  Biggest problem is COPD, walking with 3 L nasal cannula Uses oxygen tank Followed by pulmonary  Hurt her back Pulling something out of the freezer  Labs reviewed HBA1C 6.2 Total chol 102 CR 0.97  Previous records reviewed COPD exacrebation, TIA 11/27/2017 Carotid <39% b/l  symptoms resolved completely.  MRI brain did not show any acute infarct.  She converted on her own to sinus rhythm.  Chads2vasc: at least 5. her sats to 87%with ambulation    Prior CV studies:   The following studies were reviewed today:  Echo EF 55 to 65, gd II diastolic dysfunction Mild to mod MR, MOD TR, LA normal size right  Past Medical History:  Diagnosis Date  . AF (atrial fibrillation) (HCC)   . Asthma   . COPD (chronic obstructive pulmonary disease) (HCC)   . Diabetes mellitus    type 2  . Diverticulosis    s/p colonoscopy  . Gastritis    via EGD  . Hiatal hernia    s/p dilation 2004  . Osteopenia   . Stress incontinence, female   . TIA (transient ischemic attack)    Past Surgical History:  Procedure Laterality Date  . BLADDER REPAIR  9/04   bladder tack   . BREAST CYST EXCISION Right 1972  . TONSILLECTOMY     age 42  . TOTAL ABDOMINAL HYSTERECTOMY       Current Meds  Medication Sig  . albuterol (PROVENTIL HFA;VENTOLIN HFA) 108 (90 Base) MCG/ACT inhaler Inhale 1-2 puffs into the lungs every 4 (four) hours as needed for wheezing. Okay to fill with ventolin or proventil if needed.  Marland Kitchen amoxicillin-clavulanate (AUGMENTIN) 875-125 MG tablet Take 1 tablet by mouth 2 (two) times  daily.  Marland Kitchen apixaban (ELIQUIS) 5 MG TABS tablet Take 1 tablet (5 mg total) by mouth 2 (two) times daily.  Marland Kitchen atorvastatin (LIPITOR) 20 MG tablet Take 1 tablet (20 mg total) by mouth daily.  . budesonide-formoterol (SYMBICORT) 160-4.5 MCG/ACT inhaler INHALE 2 PUFFS EVERY 12 HOURS TO PREVENTCOUGH OR WHEEZING--RINSE, GARGLE, & SPITAFTER EACH USE. (Patient taking differently: Inhale 2 puffs into the lungs every 12 (twelve) hours. TO PREVENT COUGH OR WHEEZING--RINSE, GARGLE, & SPIT AFTER EACH USE.)  . cholecalciferol (VITAMIN D) 1000 units tablet Take 1,000 Units by mouth daily.  Marland Kitchen diltiazem (CARDIZEM CD) 120 MG 24 hr capsule Take 1 capsule (120 mg total) by mouth at bedtime.  . docusate sodium (COLACE) 100 MG capsule Take 1 capsule (100 mg total) by mouth 2 (two) times daily.  Marland Kitchen esomeprazole (NEXIUM) 20 MG capsule Take 20 mg by mouth daily at 12 noon.  . ferrous sulfate 325 (65 FE) MG tablet Take 1 tablet (325 mg total) by mouth 2 (two) times daily with a meal.  . fluticasone (FLONASE) 50 MCG/ACT nasal spray Place 2 sprays into both nostrils daily as needed (seasonal allergies).  . furosemide (LASIX) 20 MG tablet Take 1 tablet (20 mg total) by mouth daily as needed for fluid (take with potassium).  Marland Kitchen ipratropium-albuterol (DUONEB) 0.5-2.5 (3) MG/3ML SOLN INHALE THE CONTENTS OF 1 VIAL VIA NEBULIZER EVERY 4 HOURS AS NEEDED  . metoprolol succinate (TOPROL-XL) 50 MG 24 hr tablet TAKE 1 TABLET (50 MG TOTAL) BY MOUTH DAILY. TAKE WITH OR IMMEDIATELY FOLLOWING A MEAL.  . NON FORMULARY Oxygen 3 liters 24/7  . potassium chloride (K-DUR) 10 MEQ tablet Take 1 tablet (10 mEq total) by mouth daily as needed (take with lasix).  . predniSONE (DELTASONE) 20 MG tablet Take 2 a day for 5 days, then 1 a day for 5 days, with food. Don't take with aleve/ibuprofen.  . sulfamethoxazole-trimethoprim (BACTRIM) 400-80 MG tablet Take 1 tablet by mouth 2 (two) times daily.  . traMADol (ULTRAM) 50 MG tablet Take 1-2 tablets (50-100 mg  total) by mouth every 12 (twelve) hours as needed.  . traZODone (DESYREL) 50 MG tablet Take 0.5-1 tablets (25-50 mg total) by mouth at bedtime as needed for sleep.     Allergies:   Doxycycline   Social History   Tobacco Use  . Smoking status: Former Smoker    Types: Cigarettes    Last attempt to quit: 12/29/1988    Years since quitting: 30.3  . Smokeless tobacco: Never Used  . Tobacco comment: 20 or more years   Substance Use Topics  . Alcohol use: No    Alcohol/week: 0.0 standard drinks  . Drug use: No     Current Outpatient Medications on File Prior to Visit  Medication Sig Dispense Refill  . albuterol (PROVENTIL HFA;VENTOLIN HFA) 108 (90 Base) MCG/ACT inhaler Inhale 1-2 puffs into the lungs every 4 (four) hours as needed for wheezing. Okay to  fill with ventolin or proventil if needed. 3 Inhaler 3  . amoxicillin-clavulanate (AUGMENTIN) 875-125 MG tablet Take 1 tablet by mouth 2 (two) times daily. 20 tablet 0  . apixaban (ELIQUIS) 5 MG TABS tablet Take 1 tablet (5 mg total) by mouth 2 (two) times daily. 180 tablet 3  . atorvastatin (LIPITOR) 20 MG tablet Take 1 tablet (20 mg total) by mouth daily. 90 tablet 3  . budesonide-formoterol (SYMBICORT) 160-4.5 MCG/ACT inhaler INHALE 2 PUFFS EVERY 12 HOURS TO PREVENTCOUGH OR WHEEZING--RINSE, GARGLE, & SPITAFTER EACH USE. (Patient taking differently: Inhale 2 puffs into the lungs every 12 (twelve) hours. TO PREVENT COUGH OR WHEEZING--RINSE, GARGLE, & SPIT AFTER EACH USE.) 3 Inhaler 3  . cholecalciferol (VITAMIN D) 1000 units tablet Take 1,000 Units by mouth daily.    Marland Kitchen diltiazem (CARDIZEM CD) 120 MG 24 hr capsule Take 1 capsule (120 mg total) by mouth at bedtime. 90 capsule 3  . docusate sodium (COLACE) 100 MG capsule Take 1 capsule (100 mg total) by mouth 2 (two) times daily. 100 capsule 3  . esomeprazole (NEXIUM) 20 MG capsule Take 20 mg by mouth daily at 12 noon.    . ferrous sulfate 325 (65 FE) MG tablet Take 1 tablet (325 mg total) by  mouth 2 (two) times daily with a meal. 180 tablet 3  . fluticasone (FLONASE) 50 MCG/ACT nasal spray Place 2 sprays into both nostrils daily as needed (seasonal allergies).    . furosemide (LASIX) 20 MG tablet Take 1 tablet (20 mg total) by mouth daily as needed for fluid (take with potassium). 30 tablet 1  . ipratropium-albuterol (DUONEB) 0.5-2.5 (3) MG/3ML SOLN INHALE THE CONTENTS OF 1 VIAL VIA NEBULIZER EVERY 4 HOURS AS NEEDED 360 mL 1  . metoprolol succinate (TOPROL-XL) 50 MG 24 hr tablet TAKE 1 TABLET (50 MG TOTAL) BY MOUTH DAILY. TAKE WITH OR IMMEDIATELY FOLLOWING A MEAL. 90 tablet 0  . NON FORMULARY Oxygen 3 liters 24/7    . potassium chloride (K-DUR) 10 MEQ tablet Take 1 tablet (10 mEq total) by mouth daily as needed (take with lasix). 30 tablet 1  . predniSONE (DELTASONE) 20 MG tablet Take 2 a day for 5 days, then 1 a day for 5 days, with food. Don't take with aleve/ibuprofen. 15 tablet 0  . sulfamethoxazole-trimethoprim (BACTRIM) 400-80 MG tablet Take 1 tablet by mouth 2 (two) times daily. 6 tablet 0  . traMADol (ULTRAM) 50 MG tablet Take 1-2 tablets (50-100 mg total) by mouth every 12 (twelve) hours as needed. 120 tablet 2  . traZODone (DESYREL) 50 MG tablet Take 0.5-1 tablets (25-50 mg total) by mouth at bedtime as needed for sleep. 30 tablet 3   No current facility-administered medications on file prior to visit.      Family Hx: The patient's family history includes Stroke in her mother. There is no history of Breast cancer or Colon cancer.  ROS:   Please see the history of present illness.    Review of Systems  Constitutional: Negative.   Respiratory: Negative.   Cardiovascular: Negative.   Gastrointestinal: Negative.   Musculoskeletal: Positive for back pain.  Neurological: Negative.   Psychiatric/Behavioral: Negative.   All other systems reviewed and are negative.     Labs/Other Tests and Data Reviewed:    Recent Labs: 05/19/2018: Magnesium 2.3 06/15/2018: ALT 16; B  Natriuretic Peptide 242.3 07/29/2018: Pro B Natriuretic peptide (BNP) 230.0 10/18/2018: BUN 13; Creatinine, Ser 0.97; Potassium 3.8; Sodium 141 02/03/2019: Hemoglobin 11.1; Platelets 279.0  Recent Lipid Panel Lab Results  Component Value Date/Time   CHOL 102 02/03/2019 03:18 PM   TRIG 61.0 02/03/2019 03:18 PM   HDL 47.30 02/03/2019 03:18 PM   CHOLHDL 2 02/03/2019 03:18 PM   LDLCALC 42 02/03/2019 03:18 PM   LDLDIRECT 87.9 07/24/2014 08:28 AM    Wt Readings from Last 3 Encounters:  09/09/18 136 lb 12 oz (62 kg)  09/03/18 134 lb 12 oz (61.1 kg)  06/28/18 135 lb 12.8 oz (61.6 kg)     Exam:    Vital Signs: Vital signs may also be detailed in the HPI There were no vitals taken for this visit.  Wt Readings from Last 3 Encounters:  09/09/18 136 lb 12 oz (62 kg)  09/03/18 134 lb 12 oz (61.1 kg)  06/28/18 135 lb 12.8 oz (61.6 kg)   Temp Readings from Last 3 Encounters:  02/03/19 97.8 F (36.6 C) (Oral)  10/18/18 97.9 F (36.6 C) (Oral)  09/09/18 98.2 F (36.8 C) (Oral)   BP Readings from Last 3 Encounters:  02/03/19 114/78  10/18/18 122/80  09/09/18 124/70   Pulse Readings from Last 3 Encounters:  02/03/19 (!) 52  10/18/18 (!) 142  09/09/18 100    120/80, pulse 60 Resp: 16  Well nourished, well developed female in no acute distress. Constitutional:  oriented to person, place, and time. No distress.     ASSESSMENT & PLAN:    Atrial fibrillation with RVR (HCC) On eliquis 5 BID, Denies  episodes of tachycardia,   COPD with acute exacerbation (HCC) Declined medication from dr. Para Marchuncan, but still with sx, Wheezing not bad, just a [place]  Essential hypertension Blood pressure is well controlled on today's visit. No changes made to the medications.  TIA (transient ischemic attack) No further sx, on elqiuis   Acute diastolic congestive heart failure (HCC) euvolemic, continues to take Lasix 20 mg as needed for leg swelling abdominal bloating  COVID-19 Education:  The signs and symptoms of COVID-19 were discussed with the patient and how to seek care for testing (follow up with PCP or arrange E-visit).  The importance of social distancing was discussed today.  Patient Risk:   After full review of this patients clinical status, I feel that they are at least moderate risk at this time.  Time:   Today, I have spent 25 minutes with the patient with telehealth technology discussing the cardiac and medical problems/diagnoses detailed above   10 min spent reviewing the chart prior to patient visit today   Medication Adjustments/Labs and Tests Ordered: Current medicines are reviewed at length with the patient today.  Concerns regarding medicines are outlined above.   Tests Ordered: No tests ordered   Medication Changes: No changes made   Disposition: Follow-up in 12 months   Signed, Julien Nordmannimothy Davieon Stockham, MD  05/16/2019 1:52 PM    [facility] [place] Office [address], [place], [place] [id number]

## 2019-05-16 ENCOUNTER — Telehealth (INDEPENDENT_AMBULATORY_CARE_PROVIDER_SITE_OTHER): Payer: Medicare HMO | Admitting: Cardiovascular Disease

## 2019-05-16 ENCOUNTER — Other Ambulatory Visit: Payer: Self-pay

## 2019-05-16 DIAGNOSIS — I10 Essential (primary) hypertension: Secondary | ICD-10-CM

## 2019-05-16 DIAGNOSIS — I4891 Unspecified atrial fibrillation: Secondary | ICD-10-CM | POA: Diagnosis not present

## 2019-05-16 DIAGNOSIS — G459 Transient cerebral ischemic attack, unspecified: Secondary | ICD-10-CM | POA: Diagnosis not present

## 2019-05-16 DIAGNOSIS — J441 Chronic obstructive pulmonary disease with (acute) exacerbation: Secondary | ICD-10-CM

## 2019-05-16 DIAGNOSIS — I5031 Acute diastolic (congestive) heart failure: Secondary | ICD-10-CM

## 2019-05-16 MED ORDER — METOPROLOL SUCCINATE ER 50 MG PO TB24: 50.0000 mg | ORAL_TABLET | Freq: Every day | ORAL | 3 refills | Status: DC

## 2019-05-16 NOTE — Patient Instructions (Addendum)
Medication Instructions:  Continue current medications. Refill sent in for your Metoprolol.  If you need a refill on your cardiac medications before your next appointment, please call your pharmacy.    Lab work: No new labs needed   If you have labs (blood work) drawn today and your tests are completely normal, you will receive your results only by: Marland Kitchen MyChart Message (if you have MyChart) OR . A paper copy in the mail If you have any lab test that is abnormal or we need to change your treatment, we will call you to review the results.   Testing/Procedures: No new testing needed   Follow-Up: At [facility], you and your health needs are our priority.  As part of our continuing mission to provide you with exceptional heart care, we have created designated Provider Care Teams.  These Care Teams include your primary Cardiologist (physician) and Advanced Practice Providers (APPs -  Physician Assistants and Nurse Practitioners) who all work together to provide you with the care you need, when you need it.  . You will need a follow up appointment in 12 months .   Please call our office 2 months in advance to schedule this appointment.    . Providers on your designated Care Team:   . Nicolasa Ducking, NP . Eula Listen, PA-C . Marisue Ivan, PA-C  Any Other Special Instructions Will Be Listed Below (If Applicable).  For educational health videos Log in to : [email or web address] Or : [email or web address], password : [email or web address]

## 2019-05-17 DIAGNOSIS — H524 Presbyopia: Secondary | ICD-10-CM | POA: Diagnosis not present

## 2019-05-17 DIAGNOSIS — E113393 Type 2 diabetes mellitus with moderate nonproliferative diabetic retinopathy without macular edema, bilateral: Secondary | ICD-10-CM | POA: Diagnosis not present

## 2019-05-20 ENCOUNTER — Other Ambulatory Visit: Payer: Self-pay | Admitting: Family Medicine

## 2019-05-25 ENCOUNTER — Other Ambulatory Visit: Payer: Self-pay | Admitting: Family Medicine

## 2019-05-25 ENCOUNTER — Telehealth: Payer: Self-pay | Admitting: Family Medicine

## 2019-05-25 DIAGNOSIS — R739 Hyperglycemia, unspecified: Secondary | ICD-10-CM

## 2019-05-25 DIAGNOSIS — D649 Anemia, unspecified: Secondary | ICD-10-CM

## 2019-05-25 NOTE — Telephone Encounter (Signed)
Pt wants to know if she should get labs since she is just getting off antibiotics? I scheduled her for Monday 05/30/19 and her appt is on Tuesday 05/31/19. Please advise.

## 2019-05-25 NOTE — Telephone Encounter (Signed)
Dominique Clark notified as instructed by telephone.

## 2019-05-25 NOTE — Telephone Encounter (Signed)
I would still get the labs done.  I put in the follow-up orders.  Thanks.

## 2019-05-30 ENCOUNTER — Other Ambulatory Visit (INDEPENDENT_AMBULATORY_CARE_PROVIDER_SITE_OTHER): Payer: Medicare HMO

## 2019-05-30 DIAGNOSIS — D649 Anemia, unspecified: Secondary | ICD-10-CM

## 2019-05-30 DIAGNOSIS — J441 Chronic obstructive pulmonary disease with (acute) exacerbation: Secondary | ICD-10-CM | POA: Diagnosis not present

## 2019-05-30 DIAGNOSIS — R739 Hyperglycemia, unspecified: Secondary | ICD-10-CM

## 2019-05-30 LAB — CBC WITH DIFFERENTIAL/PLATELET
Basophils Absolute: 0.1 10*3/uL (ref 0.0–0.1)
Basophils Relative: 0.7 % (ref 0.0–3.0)
Eosinophils Absolute: 0.3 10*3/uL (ref 0.0–0.7)
Eosinophils Relative: 2.6 % (ref 0.0–5.0)
HCT: 37.5 % (ref 36.0–46.0)
Hemoglobin: 12.3 g/dL (ref 12.0–15.0)
Lymphocytes Relative: 20.5 % (ref 12.0–46.0)
Lymphs Abs: 2.2 10*3/uL (ref 0.7–4.0)
MCHC: 32.8 g/dL (ref 30.0–36.0)
MCV: 95.2 fl (ref 78.0–100.0)
Monocytes Absolute: 1 10*3/uL (ref 0.1–1.0)
Monocytes Relative: 9.5 % (ref 3.0–12.0)
Neutro Abs: 7 10*3/uL (ref 1.4–7.7)
Neutrophils Relative %: 66.7 % (ref 43.0–77.0)
Platelets: 267 10*3/uL (ref 150.0–400.0)
RBC: 3.93 Mil/uL (ref 3.87–5.11)
RDW: 13.9 % (ref 11.5–15.5)
WBC: 10.5 10*3/uL (ref 4.0–10.5)

## 2019-05-30 LAB — HEMOGLOBIN A1C: Hgb A1c MFr Bld: 6.4 % (ref 4.6–6.5)

## 2019-05-30 LAB — IRON: Iron: 72 ug/dL (ref 42–145)

## 2019-05-31 ENCOUNTER — Ambulatory Visit (INDEPENDENT_AMBULATORY_CARE_PROVIDER_SITE_OTHER): Payer: Medicare HMO | Admitting: Family Medicine

## 2019-05-31 DIAGNOSIS — Z8639 Personal history of other endocrine, nutritional and metabolic disease: Secondary | ICD-10-CM

## 2019-05-31 DIAGNOSIS — D649 Anemia, unspecified: Secondary | ICD-10-CM

## 2019-05-31 DIAGNOSIS — M545 Low back pain, unspecified: Secondary | ICD-10-CM

## 2019-05-31 DIAGNOSIS — I4891 Unspecified atrial fibrillation: Secondary | ICD-10-CM | POA: Diagnosis not present

## 2019-05-31 DIAGNOSIS — J441 Chronic obstructive pulmonary disease with (acute) exacerbation: Secondary | ICD-10-CM

## 2019-05-31 MED ORDER — TRAMADOL HCL 50 MG PO TABS: 50.0000 mg | ORAL_TABLET | Freq: Two times a day (BID) | ORAL | 2 refills | Status: DC | PRN

## 2019-05-31 MED ORDER — PREDNISONE 20 MG PO TABS: ORAL_TABLET | ORAL | 0 refills | Status: DC

## 2019-05-31 MED ORDER — AMOXICILLIN-POT CLAVULANATE 875-125 MG PO TABS: 1.0000 | ORAL_TABLET | Freq: Two times a day (BID) | ORAL | 0 refills | Status: DC

## 2019-05-31 NOTE — Progress Notes (Signed)
Interactive audio and video telecommunications were attempted between this provider and patient, however failed, due to patient having technical difficulties OR patient did not have access to video capability.  We continued and completed visit with audio only.   Virtual Visit via Telephone Note  I connected with patient on 05/31/19 at 2:07 PM by telephone and verified that I am speaking with the correct person using two identifiers.  Location of patient: home  Location of MD: [facility] Name of referring provider (if blank then none associated): Names per persons and role in encounter:  MD: Ferd Hibbs, Patient: name listed above.    I discussed the limitations, risks, security and privacy concerns of performing an evaluation and management service by telephone and the availability of in person appointments. I also discussed with the patient that there may be a patient responsible charge related to this service. The patient expressed understanding and agreed to proceed.  CC: f/u.   Pandemic consideration dw pt.   History of Present Illness:  H/o DM2.  No meds.  Hypoglycemic episodes: no sx Hyperglycemic episodes: no sx Feet problems: no Blood Sugars averaging: not checked.  eye exam within last year: pending.   She had a cough and had to take augmentin and prednisone at the end of last month.  She is some better but still with cough and sputum that is discolored some of the time.  She is using combivent TID when well and QID when she is having an exacerbation.  She is off symbicort, she couldn't afford it.  She is still on O2 and we talked about prev billing inquiry in April.  I am checking on this with staff here at the clinic.  She needed refill on tramadol and that helped with pain.  Taking with tylenol for back pain.  No ADE on med.  She is trying to exercise to get her back stronger.   She is having a lot of trouble paying for eliquis.  98% RA and pulse 72 today.     Anemia.  Still on anticoagulation.  Iron level improved.  "I've been anemic all my life."  No bleeding, not passing blood.    Observations/Objective:nad Speech wnl  Assessment and Plan:  H/o DM2.  Not on medication currently. A1c not elevated.  Discussed with patient.  No change in meds.  COPD.   She is some better but still with cough and sputum that is discolored some of the time.  Restart prednisone taper along with Augmentin.  She is using combivent TID when well and QID when she is having an exacerbation.  She is off symbicort, she couldn't afford it.  She is still on O2 and we talked about prev billing inquiry in April.  I am checking on this with staff here at the clinic.  I will see about Symbicort options.  She needed refill on tramadol and that helped with pain.  Taking with tylenol for back pain.  No ADE on med.  She is trying to exercise to get her back stronger.   She is having a lot of trouble paying for eliquis.  I will check on this.  Anemia.  Still on anticoagulation.  Iron level improved.  "I've been anemic all my life."  No bleeding, not passing blood.   Would continue as is.  She agrees.  Follow Up Instructions: see above.   I discussed the assessment and treatment plan with the patient. The patient was provided an opportunity to  ask questions and all were answered. The patient agreed with the plan and demonstrated an understanding of the instructions.   The patient was advised to call back or seek an in-person evaluation if the symptoms worsen or if the condition fails to improve as anticipated.  I provided 21 minutes of non-face-to-face time during this encounter.  Crawford GivensGraham , MD

## 2019-06-01 NOTE — Assessment & Plan Note (Signed)
H/o DM2.  Not on medication currently. A1c not elevated.  Discussed with patient.  No change in meds.

## 2019-06-01 NOTE — Assessment & Plan Note (Signed)
Anemia.  Still on anticoagulation.  Iron level improved.  "I've been anemic all my life."  No bleeding, not passing blood.   Would continue as is.  She agrees.

## 2019-06-01 NOTE — Assessment & Plan Note (Signed)
She is having a lot of trouble paying for eliquis.  I will check on this.

## 2019-06-01 NOTE — Assessment & Plan Note (Signed)
She is some better but still with cough and sputum that is discolored some of the time.  Restart prednisone taper along with Augmentin.  She is using combivent TID when well and QID when she is having an exacerbation.  She is off symbicort, she couldn't afford it.  She is still on O2 and we talked about prev billing inquiry in April.  I am checking on this with staff here at the clinic.  I will see about Symbicort options.

## 2019-06-01 NOTE — Assessment & Plan Note (Signed)
She needed refill on tramadol and that helped with pain.  Taking with tylenol for back pain.  No ADE on med.  She is trying to exercise to get her back stronger.

## 2019-06-02 ENCOUNTER — Encounter: Payer: Self-pay | Admitting: *Deleted

## 2019-06-03 NOTE — Telephone Encounter (Addendum)
Dr. Para March and I discussed this at length.  At this time patient is receiving oxygen and supplies appropriately.  We were unable to get any clear resolution regarding pre auth request from back in March/April as we received invalid code coverage information from the insurance company.    Despite speaking with them on numerous attempts they were unable to give [place] any direction as to how to resolve this for the patient.  At this time we are at a standstill and not sure how to proceed.    As patient is receiving the necessary services and care at this time, we will wait until clearer information and direction can be provided but are happy to send in whatever is needed to resolve this matter.   Letter from insurance company was given back to [employer], CMA on 06/02/19 to file for the time being.

## 2019-06-03 NOTE — Telephone Encounter (Signed)
Noted.  Agreed.  Thanks. 

## 2019-06-20 ENCOUNTER — Telehealth: Payer: Self-pay | Admitting: Family Medicine

## 2019-06-20 ENCOUNTER — Telehealth: Payer: Self-pay | Admitting: Cardiovascular Disease

## 2019-06-20 DIAGNOSIS — J441 Chronic obstructive pulmonary disease with (acute) exacerbation: Secondary | ICD-10-CM

## 2019-06-20 NOTE — Telephone Encounter (Signed)
   [facility] Pre-operative Risk Assessment    Request for surgical clearance:  1. What type of surgery is being performed? Cataract extraction w/intraocular lens implantation fo the right eye/left eye 2.  3. When is this surgery scheduled? TBD   4. What type of clearance is required (medical clearance vs. Pharmacy clearance to hold med vs. Both)? Not listed  5. Are there any medications that need to be held prior to surgery and how long? Not listed  6. Practice name and name of physician performing surgery? Dr. Darleen Crocker [facility]  7. What is your office phone number [phone]   7.   What is your office fax number [phone]   8.   Anesthesia type (None, local, MAC, general) ? Topical with IV medication   Lucienne Minks 06/20/2019, 7:53 AM  _________________________________________________________________   (provider comments below)

## 2019-06-20 NOTE — Telephone Encounter (Signed)
   Primary Cardiologist: Ida Rogue, MD  Chart reviewed as part of pre-operative protocol coverage. Cataract extractions are recognized in guidelines as low risk surgeries that do not typically require specific preoperative testing or holding of blood thinner therapy. Therefore, given past medical history and time since last visit, based on ACC/AHA guidelines, Dominique Clark would be at acceptable risk for the planned procedure without further cardiovascular testing. She has hx of afib on Eliquis with no recent episodes of tachycardia. Her biggest issue is COPD.   I will route this recommendation to the requesting party via Epic fax function and remove from pre-op pool.  Please call with questions.  Daune Perch, NP 06/20/2019, 11:59 AM

## 2019-06-20 NOTE — Telephone Encounter (Signed)
Referral placed for [place] for medication assistance.

## 2019-06-21 ENCOUNTER — Other Ambulatory Visit: Payer: Self-pay

## 2019-06-21 NOTE — Patient Outreach (Signed)
[facility]) Care Management  06/21/2019  Dominique Clark February 15, 1941 [id number]   Referral Date: 06/21/2019 Referral Source: MD referral Referral Reason: Medication assistance   Outreach Attempt: Spoke with patient. She is able to verify HIPAA.  Discussed reason for referral.  She states that she has problems paying for Eliquis, Symbicort, and albuterol.  Patient could not give amount on the call.  Discussed [facility] services and how we could support patient.  Patient prefers pharmacy only assistance at this time.     Social: Patient lives alone and is able to perform ADL's independently.  She states she has the support of family and church members.     Conditions: Patient admits to COPD, HTN, A. Fib. And GERD.  Patient states she wears oxygen at 2 liters 24/7 and feels she manages her health issues good.     Medications: Patient states she takes her medications as prescribed except for the Symbicort as she cannot afford to and she has to make a decision on what she will buy when it comes to her medications.     Appointment: Patient recently saw PCP and states that her brother or children take her to appointments.  Advanced Directives: Patient does not have an advanced directive and declines needing assistance executing one.     Plan: RN CM will refer patient to pharmacy for medication assistance.     Jone Baseman, RN, MSN [facility] Care Management Care Management Coordinator Direct Line [phone] Toll Free: [phone]  Fax: [phone]

## 2019-06-22 ENCOUNTER — Other Ambulatory Visit: Payer: Self-pay | Admitting: Pharmacist

## 2019-06-22 NOTE — Patient Outreach (Signed)
[place] [facility]) Care Management  [place]   06/22/2019  TAILYNN ARMETTA 10-18-1941 [id number]  Reason for referral: Medication Assistance with Symbicort, Albuterol, and Eliquis  Referral source: Dr. Keani Stain Current insurance: [facility]  PMHx includes but not limited to: T2DM not on medications, HTN, COPD, atrial fibrillation on anticoagulation, hx TIA, GERD, anemia, hx hiatal hernia, diverticulosis, female stress incontinence, CHF  Outreach:  Unsuccessful telephone call attempt #1 to patient. HIPAA compliant voicemail left requesting a return call  Plan:  -I will mail patient an unsuccessful outreach letter.  -I will make another outreach attempt to patient within 3-4 business days.    Ralene Bathe, PharmD, [place] [phone]

## 2019-06-23 DIAGNOSIS — H25013 Cortical age-related cataract, bilateral: Secondary | ICD-10-CM | POA: Diagnosis not present

## 2019-06-23 DIAGNOSIS — H2513 Age-related nuclear cataract, bilateral: Secondary | ICD-10-CM | POA: Diagnosis not present

## 2019-06-23 DIAGNOSIS — H25043 Posterior subcapsular polar age-related cataract, bilateral: Secondary | ICD-10-CM | POA: Diagnosis not present

## 2019-06-23 DIAGNOSIS — H2512 Age-related nuclear cataract, left eye: Secondary | ICD-10-CM | POA: Diagnosis not present

## 2019-06-23 DIAGNOSIS — H18413 Arcus senilis, bilateral: Secondary | ICD-10-CM | POA: Diagnosis not present

## 2019-06-28 ENCOUNTER — Other Ambulatory Visit: Payer: Self-pay | Admitting: Pharmacist

## 2019-06-28 ENCOUNTER — Ambulatory Visit: Payer: Self-pay | Admitting: Pharmacist

## 2019-06-28 NOTE — Patient Outreach (Addendum)
[facility]) [place]   06/28/2019  CHERRON BLITZER 1941-10-05 [id number]  Reason for referral: Medication Assistance  Referral source: Dr. Ashrita Stain Current insurance: [facility]  PMHx includes but not limited to:  COPD, T2DM not on medications, low back pain, atrial fibrillation on chronic anticoagulation, anemia.  Per notes, Stopped taking Symbicort due to cost per PCP notes, having difficulty affording Eliquis.   Outreach:  Successful telephone call with Ms. Brisky.  HIPAA identifiers verified.   Subjective:  Patient reports her brother lives next door and checks up on her every 2 hours.  She states she cannot read due to vision difficulty. She is planning on having eye surgery on Aug 12 and Sept 7th.   She reports her daughter organizes her medications into pillboxes for her.  She is unable to confirm current medications as she cannot read the pill labels.  She also is unable to read EOB statement to find out how much she has spent in 2020 on copays for medications.    Objective: The ASCVD Risk score Mikey Bussing DC Jr., et al., 2013) failed to calculate for the following reasons:   The valid total cholesterol range is 130 to 320 mg/dL  Lab Results  Component Value Date   CREATININE 0.97 10/18/2018   CREATININE 0.99 07/29/2018   CREATININE 1.07 06/24/2018    Lab Results  Component Value Date   HGBA1C 6.4 05/30/2019    Lipid Panel     Component Value Date/Time   CHOL 102 02/03/2019 1518   TRIG 61.0 02/03/2019 1518   HDL 47.30 02/03/2019 1518   CHOLHDL 2 02/03/2019 1518   VLDL 12.2 02/03/2019 1518   LDLCALC 42 02/03/2019 1518   LDLDIRECT 87.9 07/24/2014 0828    BP Readings from Last 3 Encounters:  02/03/19 114/78  10/18/18 122/80  09/09/18 124/70    Allergies  Allergen Reactions  . Doxycycline Nausea And Vomiting    Medications Reviewed Today    Reviewed by Tonia Ghent, MD (Physician) on 05/31/19 at 1414  Med List  Status: <None>  Medication Order Taking? Sig Documenting Provider Last Dose Status Informant  albuterol (PROVENTIL HFA;VENTOLIN HFA) 108 (90 Base) MCG/ACT inhaler [id number] Yes Inhale 1-2 puffs into the lungs every 4 (four) hours as needed for wheezing. Okay to fill with ventolin or proventil if needed. Tonia Ghent, MD Taking Active Self        Discontinued 05/31/19 1408 (Error)   apixaban (ELIQUIS) 5 MG TABS tablet [id number] Yes Take 1 tablet (5 mg total) by mouth 2 (two) times daily. Tonia Ghent, MD Taking Active   atorvastatin (LIPITOR) 20 MG tablet [id number] Yes Take 1 tablet (20 mg total) by mouth daily. Tonia Ghent, MD Taking Active        Patient not taking:      Discontinued 05/31/19 1411 (Error)   cholecalciferol (VITAMIN D) 1000 units tablet [id number] Yes Take 1,000 Units by mouth daily. [provider] Taking Active Self  diltiazem (CARDIZEM CD) 120 MG 24 hr capsule [id number] Yes Take 1 capsule (120 mg total) by mouth at bedtime. Tonia Ghent, MD Taking Active   DOK 100 MG capsule [id number] Yes TAKE 1 CAPSULE (100 MG TOTAL) BY MOUTH 2 (TWO) TIMES DAILY. Tonia Ghent, MD Taking Active   esomeprazole (NEXIUM) 20 MG capsule [id number] Yes Take 20 mg by mouth daily at 12 noon. [provider] Taking Active Self  ferrous sulfate 325 (65 FE) MG  tablet [id number] Yes Take 1 tablet (325 mg total) by mouth 2 (two) times daily with a meal. Tonia Ghent, MD Taking Active   fluticasone [facility]) 50 MCG/ACT nasal spray [id number] Yes Place 2 sprays into both nostrils daily as needed (seasonal allergies). Tonia Ghent, MD Taking Active Self  furosemide (LASIX) 20 MG tablet [id number] Yes Take 1 tablet (20 mg total) by mouth daily as needed for fluid (take with potassium). Tonia Ghent, MD Taking Active   ipratropium-albuterol (DUONEB) 0.5-2.5 (3) MG/3ML Bailey Mech [id number] Yes INHALE THE CONTENTS OF 1 VIAL VIA NEBULIZER EVERY 4 HOURS AS NEEDED Laverle Hobby, MD Taking Active   metoprolol succinate (TOPROL-XL) 50 MG 24 hr tablet [id number] Yes Take 1 tablet (50 mg total) by mouth daily. Take with or immediately following a meal. Minna Merritts, MD Taking Active   NON FORMULARY [id number] Yes Oxygen 3 liters 24/7 [provider] Taking Active Self  potassium chloride (K-DUR) 10 MEQ tablet [id number] Yes Take 1 tablet (10 mEq total) by mouth daily as needed (take with lasix). Tonia Ghent, MD Taking Active         Discontinued 05/31/19 1414 (Error)         Discontinued 05/31/19 1413 (Error)   traMADol (ULTRAM) 50 MG tablet [id number] Yes Take 1-2 tablets (50-100 mg total) by mouth every 12 (twelve) hours as needed. Tonia Ghent, MD Taking Active   traZODone (DESYREL) 50 MG tablet [id number] Yes Take 0.5-1 tablets (25-50 mg total) by mouth at bedtime as needed for sleep. Tonia Ghent, MD Taking Active Self           Med Note Earna Coder Oct 18, 2018 11:04 AM)            Assessment: Unable to review medications.  Patient reports she has "a list" in the house but cannot read it.   Medication Assistance Findings:  Medication assistance needs identified: Symbicort, Albuterol, Eliquis   Estimated annual income ~~$22,620 / year.    Extra Help:  Not eligible for Extra Help Low Income Subsidy based on reported income and assets  Patient Assistance Programs: Symbicort made by [employer] o Income requirement met: Yes o Out-of-pocket prescription expenditure met:   Unknown - Patient unable to confirm TROOP via [employer] EOB during telephone call - Reviewed program requirements with patient.    Eliquis made by [employer] o Income requirement met: Yes o Out-of-pocket prescription expenditure met:   Unknown - Patient unable to confirm TROOP via [employer] EOB during telephone call - Reviewed program requirements with patient.    Albuterol inhaler made by [employer] o Income requirement met: Yes o Out-of-pocket prescription  expenditure met:   Not Applicable - Patient has met application requirements to apply for this patient assistance program.   - Reviewed program requirements with patient.    Call placed to patient's daughter, Butch Penny.  Butch Penny will be at patient's home tomorrow at 12 noon and will be able to assist with medication review and reading TROOP information from EOB.    Plan: -Will f/u with patient and daughter tomorrow at 12:00 PM  Ralene Bathe, PharmD, [place] [phone]   Addendum: Patient requests that we wait to talk until next Tuesday, July 7th.  Will reschedule appt.   Ralene Bathe, PharmD, [place] [phone]

## 2019-06-29 ENCOUNTER — Ambulatory Visit: Payer: Self-pay | Admitting: Pharmacist

## 2019-06-29 DIAGNOSIS — J441 Chronic obstructive pulmonary disease with (acute) exacerbation: Secondary | ICD-10-CM | POA: Diagnosis not present

## 2019-07-05 ENCOUNTER — Ambulatory Visit: Payer: Self-pay | Admitting: Pharmacist

## 2019-07-05 ENCOUNTER — Other Ambulatory Visit: Payer: Self-pay | Admitting: Pharmacist

## 2019-07-05 NOTE — Patient Outreach (Signed)
[place] [facility]) Care Management  [place] 07/05/2019  PAULETT KAUFHOLD 05/18/1941 [id number]  Call placed to Ms. Boorman to review medications and patient assistance programs.  Unfortunately her daughter is sick and has not been able to come over to help patient read her insurance EOB.  Patient requests that I call back again early next week to give her more time to find out what she has spent so far on medication co-pays and for her daughter to be present to review medications.   Plan: Call patient next Monday at 12:00PM  Ralene Bathe, PharmD, [place] [phone]

## 2019-07-08 NOTE — Telephone Encounter (Signed)
Dominique Clark pts niece (do not see signed DPR) left v/m  Requesting cb about oxygen co and supplies that is taking care of Oxygen needs. Pt wants to know the status of the oxygen carrier. Pt wants emergency # for the oxygen company and a number to contact if needs oxygen supplies or problem with oxygen equipment. Please advise.

## 2019-07-12 NOTE — Telephone Encounter (Signed)
Left detailed message on voicemail of niece, Theodoro Parma after speaking with the patient and receiving verbal ok from her to speak with her niece.  Patient says that she has the bill for the oxygen and she thinks it is all worked out right now and the bill has all of the information that her niece is asking to get.

## 2019-07-13 ENCOUNTER — Telehealth: Payer: Self-pay

## 2019-07-13 NOTE — Telephone Encounter (Signed)
Theodoro Parma said that she was calling for Dominique Clark responding to a missed call by Terri Skains Mardene Celeste was not positive about the name but thought it was [place]). Mardene Celeste said pt got a bill and they think they have a contact # for the oxygen co. In case of emergent need for oxygen supplies or if something goes wrong with the equipment but Mardene Celeste said if Lugene had time she could call Mardene Celeste back.

## 2019-07-13 NOTE — Telephone Encounter (Signed)
[place] Night - Client Nonclinical Telephone Record AccessNurse Client [place] Night - Client Client Site [facility] Physician Renford Dills - MD Contact Type Call Who Is Calling Patient / Member / Family / Caregiver Caller Name Theodoro Parma Caller Phone Number [phone] Call Type Message Only Information Provided Reason for Call Returning a Call from the Office Initial [place] states she missed a call regarding her aunt. Additional Comment They got the bill after they called. Call Closed By: Mable Paris Transaction Date/Time: 07/12/2019 5:27:53 PM (ET)

## 2019-07-16 ENCOUNTER — Other Ambulatory Visit: Payer: Self-pay | Admitting: Family Medicine

## 2019-07-21 ENCOUNTER — Other Ambulatory Visit: Payer: Self-pay | Admitting: Family Medicine

## 2019-07-21 ENCOUNTER — Other Ambulatory Visit: Payer: Self-pay | Admitting: Pharmacist

## 2019-07-21 NOTE — Patient Outreach (Signed)
[facility]) Care Management  [place]  07/21/2019  Dominique Clark 1941/05/07 [id number]  Reason for call: f/u on patient assistance program options  Unsuccessful telephone call attempt #1 to patient.   HIPAA compliant voicemail left requesting a return call  Plan:  -I will make another outreach attempt to patient within 3-4 business days.    Ralene Bathe, PharmD, [place] [phone]

## 2019-07-25 ENCOUNTER — Other Ambulatory Visit: Payer: Self-pay | Admitting: Pharmacist

## 2019-07-25 ENCOUNTER — Ambulatory Visit: Payer: Self-pay | Admitting: Pharmacist

## 2019-07-25 NOTE — Patient Outreach (Signed)
[place] [facility]) Care Management  [place] 07/25/2019  Dominique Clark 05-18-1941 [id number]  Reason for call: f/u on patient assistance program options    Outreach:  Unsuccessful telephone call attempt #2 to patient. HIPAA compliant voicemail left requesting a return call  Plan:  -I will make another outreach attempt to patient within 3-4 business days.    Ralene Bathe, PharmD, [place] [phone]

## 2019-07-29 ENCOUNTER — Ambulatory Visit: Payer: Self-pay | Admitting: Pharmacist

## 2019-07-29 ENCOUNTER — Other Ambulatory Visit: Payer: Self-pay | Admitting: Pharmacist

## 2019-07-29 NOTE — Patient Outreach (Signed)
[place] [facility]) Care Management  [name]  07/29/2019  Dominique Clark 07/17/1941 [id number]   Reason for referral: f/u on patient assistance program options  Outreach:  Unsuccessful telephone call attempt #3 to patient.   HIPAA compliant voicemail left requesting a return call  Plan:  -I will close [place] case at this time as I have been unable to establish and/or maintain contact with patient.  -I am happy to assist in the future as needed.    Ralene Bathe, PharmD, [place] [phone]    s

## 2019-07-30 DIAGNOSIS — J441 Chronic obstructive pulmonary disease with (acute) exacerbation: Secondary | ICD-10-CM | POA: Diagnosis not present

## 2019-08-10 DIAGNOSIS — H2512 Age-related nuclear cataract, left eye: Secondary | ICD-10-CM | POA: Diagnosis not present

## 2019-08-11 DIAGNOSIS — H2511 Age-related nuclear cataract, right eye: Secondary | ICD-10-CM | POA: Diagnosis not present

## 2019-08-24 DIAGNOSIS — H2511 Age-related nuclear cataract, right eye: Secondary | ICD-10-CM | POA: Diagnosis not present

## 2019-08-30 DIAGNOSIS — J441 Chronic obstructive pulmonary disease with (acute) exacerbation: Secondary | ICD-10-CM | POA: Diagnosis not present

## 2019-09-29 DIAGNOSIS — J441 Chronic obstructive pulmonary disease with (acute) exacerbation: Secondary | ICD-10-CM | POA: Diagnosis not present

## 2019-09-30 ENCOUNTER — Telehealth: Payer: Self-pay

## 2019-09-30 MED ORDER — AMOXICILLIN-POT CLAVULANATE 875-125 MG PO TABS: 1.0000 | ORAL_TABLET | Freq: Two times a day (BID) | ORAL | 0 refills | Status: DC

## 2019-09-30 MED ORDER — PREDNISONE 20 MG PO TABS: ORAL_TABLET | ORAL | 0 refills | Status: DC

## 2019-09-30 NOTE — Telephone Encounter (Addendum)
I would treat her presumptively for COPD exacerbation given her hx and proceed with covid testing when possible. Thanks.  rxs sent.

## 2019-09-30 NOTE — Telephone Encounter (Signed)
On 09/28/19 and 09/29/19 pt felt so bad she did not get out of bed; chills, prod cough with yellow phlegm,wheezing at night but neb treatment helps that. Pt had diarrhea on 09/28/19; SOB and pt is on O2 @3L  all the time. No travel and no known exposure to covid. Pt cannot go to [facility] main visitor entrance today because no one will be home until after 4 PM but pt will go to [facility] main visitor entrance on 10/03/19  At 8 AM for covid testing. ED precautions given and pt voiced understanding. Pt is keeping appt on 10/03/19 at 12 noon with Dr Damita Dunnings but will be phone visit for FU on cough and wheezing.pt will self quarantine until gets test results back. Pt will ck with [place] later this afternoon to get abx and prednisone. FYI to DR Damita Dunnings for abx and prednisone to [place],.

## 2019-10-03 ENCOUNTER — Ambulatory Visit (INDEPENDENT_AMBULATORY_CARE_PROVIDER_SITE_OTHER): Payer: Medicare HMO | Admitting: Family Medicine

## 2019-10-03 DIAGNOSIS — G8929 Other chronic pain: Secondary | ICD-10-CM | POA: Diagnosis not present

## 2019-10-03 DIAGNOSIS — Z20828 Contact with and (suspected) exposure to other viral communicable diseases: Secondary | ICD-10-CM | POA: Diagnosis not present

## 2019-10-03 DIAGNOSIS — J441 Chronic obstructive pulmonary disease with (acute) exacerbation: Secondary | ICD-10-CM | POA: Diagnosis not present

## 2019-10-03 DIAGNOSIS — I5033 Acute on chronic diastolic (congestive) heart failure: Secondary | ICD-10-CM

## 2019-10-03 DIAGNOSIS — M25561 Pain in right knee: Secondary | ICD-10-CM

## 2019-10-03 DIAGNOSIS — M25562 Pain in left knee: Secondary | ICD-10-CM

## 2019-10-03 MED ORDER — TRAMADOL HCL 50 MG PO TABS: 50.0000 mg | ORAL_TABLET | Freq: Two times a day (BID) | ORAL | 2 refills | Status: DC | PRN

## 2019-10-03 MED ORDER — FUROSEMIDE 20 MG PO TABS: 20.0000 mg | ORAL_TABLET | Freq: Every day | ORAL | 1 refills | Status: DC | PRN

## 2019-10-03 MED ORDER — POTASSIUM CHLORIDE ER 10 MEQ PO TBCR: 10.0000 meq | EXTENDED_RELEASE_TABLET | Freq: Every day | ORAL | 1 refills | Status: DC | PRN

## 2019-10-03 NOTE — Progress Notes (Signed)
Interactive audio and video telecommunications were attempted between this provider and patient, however failed, due to patient having technical difficulties OR patient did not have access to video capability.  We continued and completed visit with audio only.   Virtual Visit via Telephone Note  I connected with patient on 10/03/19  at 12:25 PM  by telephone and verified that I am speaking with the correct person using two identifiers.  Location of patient: home  Location of MD: [place] of referring provider (if blank then none associated): Names per persons and role in encounter:  MD: Earlyne Iba, Patient: Dominique Clark.    I discussed the limitations, risks, security and privacy concerns of performing an evaluation and management service by telephone and the availability of in person appointments. I also discussed with the patient that there may be a patient responsible charge related to this service. The patient expressed understanding and agreed to proceed.  CC: cough  History of Present Illness:   She has used K and lasix prn, needed refill.  Prescription sent.  Tramadol used prn for knee and back pain.  Some relief on med.  "I couldn't make it without the medicine."   She is off symbicort due to cost. Patient is going to follow up [facility] staff about getting some help with that.    She called in with cough, yellow sputum.  Some diarrhea.  Started on prednisone and augmentin.  She had covid test this AM, results pending.  She doesn't have any known exposures to covid.    This episode is similar to prev episodes with COPD exacerbations.  She is some better with abx and prednisone, w/o ADE.  She reports her breathing is better.    98% on 3L at baseline, she uses 3L on long cord set up, 2L on short cord, per patient report.  Observations/Objective: No apparent distress Speech normal.  Assessment and Plan: COPD exacerbation. She has had episodes when she required  extension of pred and abx, d/w pt.  She'll update me early next week, sooner if needed.  She is some better in the meantime.  I'll await her COVID test.  Steroid and covid cautions d/w pt.    She'll get her flu shot later on.    She will continue potassium and Lasix at baseline.  Continue tramadol.  No adverse effect on medication.  She does get relief from medication.  Follow Up Instructions: see Clark.    I discussed the assessment and treatment plan with the patient. The patient was provided an opportunity to ask questions and all were answered. The patient agreed with the plan and demonstrated an understanding of the instructions.   The patient was advised to call back or seek an in-person evaluation if the symptoms worsen or if the condition fails to improve as anticipated.  I provided 21 minutes of non-face-to-face time during this encounter.  Don Stain, MD

## 2019-10-05 NOTE — Assessment & Plan Note (Signed)
She has had episodes when she required extension of pred and abx, d/w pt.  She'll update me early next week, sooner if needed.  She is some better in the meantime.  I'll await her COVID test.  Steroid and covid cautions d/w pt.    She'll get her flu shot later on.

## 2019-10-05 NOTE — Assessment & Plan Note (Signed)
Continue Lasix and potassium at baseline.

## 2019-10-05 NOTE — Assessment & Plan Note (Signed)
Continue tramadol as needed.  No adverse effect on medication.  She does get relief from medication.

## 2019-10-10 ENCOUNTER — Other Ambulatory Visit: Payer: Self-pay | Admitting: Family Medicine

## 2019-10-10 ENCOUNTER — Telehealth: Payer: Self-pay

## 2019-10-10 MED ORDER — PREDNISONE 20 MG PO TABS: ORAL_TABLET | ORAL | 0 refills | Status: DC

## 2019-10-10 NOTE — Telephone Encounter (Signed)
Patient called stating that the only number that she can find on the form is #[phone].

## 2019-10-10 NOTE — Telephone Encounter (Signed)
Patient notified results were negative & that prednisone had been sent to pharmacy.

## 2019-10-10 NOTE — Telephone Encounter (Signed)
Pt left v/m that 1 wk ago pt had covid testing and has not gotten lab result for covid yet. Pt wants to know if Dr Damita Dunnings has gotten test yet. I spoke with pt and pt went to [facility] on 10/03/19 for covid testing. Pt is also requesting refill on prednisone; pt has finished abx and prod cough is still thick and yellow and pt thinks needs another refill of prednisone to get rid of phlegm. [place]. Pt is on oxygen all the time. Pt is feeling better but wants to get rid of cough and colored phlegm. Pt also request cb about med refill and covid lab results. I spoke with Levada Dy at [facility] lab and she could not find results of covid testing. I spoke with pt and she is sure she had testing at [place] entrance on 10/03/19. I spoke with VeVe at [place] [phone] and pt was found as Johny Shock instead of Brewster, same DOB and address. Levie Heritage will fax covid results to [phone] (fax near Lugene's desk). CMA for Dr Damita Dunnings aware to look for test results and give to Dr Damita Dunnings and then send for scanning. Pt request cb after reviews.

## 2019-10-10 NOTE — Telephone Encounter (Signed)
Please check on the incoming fax re: covid test.    I would extend  the prednisone.  Rx sent.  If not improving then let me know.  Thanks.

## 2019-10-26 ENCOUNTER — Other Ambulatory Visit: Payer: Self-pay | Admitting: Family Medicine

## 2019-10-26 NOTE — Telephone Encounter (Signed)
Sent. Thanks.   

## 2019-10-26 NOTE — Telephone Encounter (Signed)
Eliquis Last filled:  01/17/19, #60 Last OV:  10/03/19, COPD Next OV:  none

## 2019-10-30 DIAGNOSIS — J441 Chronic obstructive pulmonary disease with (acute) exacerbation: Secondary | ICD-10-CM | POA: Diagnosis not present

## 2019-11-21 ENCOUNTER — Other Ambulatory Visit: Payer: Self-pay | Admitting: Family Medicine

## 2019-11-21 DIAGNOSIS — J441 Chronic obstructive pulmonary disease with (acute) exacerbation: Secondary | ICD-10-CM

## 2019-11-21 DIAGNOSIS — I5033 Acute on chronic diastolic (congestive) heart failure: Secondary | ICD-10-CM

## 2019-11-29 DIAGNOSIS — J441 Chronic obstructive pulmonary disease with (acute) exacerbation: Secondary | ICD-10-CM | POA: Diagnosis not present

## 2019-11-30 ENCOUNTER — Other Ambulatory Visit: Payer: Self-pay | Admitting: Family Medicine

## 2019-11-30 DIAGNOSIS — I5033 Acute on chronic diastolic (congestive) heart failure: Secondary | ICD-10-CM

## 2019-11-30 DIAGNOSIS — J441 Chronic obstructive pulmonary disease with (acute) exacerbation: Secondary | ICD-10-CM

## 2019-12-09 ENCOUNTER — Other Ambulatory Visit: Payer: Self-pay | Admitting: Family Medicine

## 2019-12-26 ENCOUNTER — Other Ambulatory Visit: Payer: Self-pay | Admitting: *Deleted

## 2019-12-26 DIAGNOSIS — J441 Chronic obstructive pulmonary disease with (acute) exacerbation: Secondary | ICD-10-CM

## 2019-12-26 DIAGNOSIS — I5033 Acute on chronic diastolic (congestive) heart failure: Secondary | ICD-10-CM

## 2019-12-26 MED ORDER — FUROSEMIDE 20 MG PO TABS: 20.0000 mg | ORAL_TABLET | Freq: Every day | ORAL | 1 refills | Status: DC | PRN

## 2019-12-30 DIAGNOSIS — J441 Chronic obstructive pulmonary disease with (acute) exacerbation: Secondary | ICD-10-CM | POA: Diagnosis not present

## 2020-01-03 ENCOUNTER — Other Ambulatory Visit: Payer: Self-pay | Admitting: Internal Medicine

## 2020-01-04 ENCOUNTER — Other Ambulatory Visit: Payer: Self-pay | Admitting: Family Medicine

## 2020-01-04 DIAGNOSIS — J441 Chronic obstructive pulmonary disease with (acute) exacerbation: Secondary | ICD-10-CM

## 2020-01-04 DIAGNOSIS — I5033 Acute on chronic diastolic (congestive) heart failure: Secondary | ICD-10-CM

## 2020-01-10 ENCOUNTER — Telehealth: Payer: Self-pay

## 2020-01-10 NOTE — Telephone Encounter (Signed)
Received fax from [facility] for diabetic testing supplies. Spoke with patient and she did not request this. Sent fax back and denied order.

## 2020-01-19 ENCOUNTER — Telehealth: Payer: Self-pay | Admitting: Family Medicine

## 2020-01-19 MED ORDER — IPRATROPIUM-ALBUTEROL 0.5-2.5 (3) MG/3ML IN SOLN: RESPIRATORY_TRACT | 1 refills | Status: DC

## 2020-01-19 NOTE — Telephone Encounter (Signed)
Done.  Thanks.  Needs yearly visit with labs ahead of time when possible.  Please schedule.

## 2020-01-19 NOTE — Telephone Encounter (Signed)
Pt is asking that Dr Para March take over prescribing her Duoneb nebulizer medication.  Pt states that she no longer sees the Pulmonologist that was prescribing this.   Requesting this be sent to [facility] pharmacy.   Please advise, thanks.

## 2020-01-20 NOTE — Telephone Encounter (Signed)
Left message for patient to call the office. Need to schedule awv & labs 

## 2020-01-20 NOTE — Telephone Encounter (Signed)
Spoke with pt she will call back next week

## 2020-01-30 DIAGNOSIS — J441 Chronic obstructive pulmonary disease with (acute) exacerbation: Secondary | ICD-10-CM | POA: Diagnosis not present

## 2020-02-02 ENCOUNTER — Other Ambulatory Visit: Payer: Self-pay

## 2020-02-02 ENCOUNTER — Telehealth: Payer: Self-pay

## 2020-02-02 ENCOUNTER — Ambulatory Visit: Payer: Medicare HMO

## 2020-02-02 ENCOUNTER — Other Ambulatory Visit: Payer: Self-pay | Admitting: Family Medicine

## 2020-02-02 ENCOUNTER — Other Ambulatory Visit (INDEPENDENT_AMBULATORY_CARE_PROVIDER_SITE_OTHER): Payer: Medicare HMO

## 2020-02-02 DIAGNOSIS — I4891 Unspecified atrial fibrillation: Secondary | ICD-10-CM

## 2020-02-02 DIAGNOSIS — E785 Hyperlipidemia, unspecified: Secondary | ICD-10-CM

## 2020-02-02 DIAGNOSIS — M899 Disorder of bone, unspecified: Secondary | ICD-10-CM

## 2020-02-02 DIAGNOSIS — D649 Anemia, unspecified: Secondary | ICD-10-CM

## 2020-02-02 DIAGNOSIS — R739 Hyperglycemia, unspecified: Secondary | ICD-10-CM | POA: Diagnosis not present

## 2020-02-02 DIAGNOSIS — M8580 Other specified disorders of bone density and structure, unspecified site: Secondary | ICD-10-CM | POA: Diagnosis not present

## 2020-02-02 LAB — CBC WITH DIFFERENTIAL/PLATELET
Basophils Absolute: 0 10*3/uL (ref 0.0–0.1)
Basophils Relative: 0.5 % (ref 0.0–3.0)
Eosinophils Absolute: 0.4 10*3/uL (ref 0.0–0.7)
Eosinophils Relative: 5.8 % — ABNORMAL HIGH (ref 0.0–5.0)
HCT: 37.5 % (ref 36.0–46.0)
Hemoglobin: 12.2 g/dL (ref 12.0–15.0)
Lymphocytes Relative: 21.4 % (ref 12.0–46.0)
Lymphs Abs: 1.5 10*3/uL (ref 0.7–4.0)
MCHC: 32.4 g/dL (ref 30.0–36.0)
MCV: 97.4 fl (ref 78.0–100.0)
Monocytes Absolute: 0.8 10*3/uL (ref 0.1–1.0)
Monocytes Relative: 10.6 % (ref 3.0–12.0)
Neutro Abs: 4.4 10*3/uL (ref 1.4–7.7)
Neutrophils Relative %: 61.7 % (ref 43.0–77.0)
Platelets: 220 10*3/uL (ref 150.0–400.0)
RBC: 3.85 Mil/uL — ABNORMAL LOW (ref 3.87–5.11)
RDW: 13.6 % (ref 11.5–15.5)
WBC: 7.2 10*3/uL (ref 4.0–10.5)

## 2020-02-02 LAB — COMPREHENSIVE METABOLIC PANEL
ALT: 8 U/L (ref 0–35)
AST: 19 U/L (ref 0–37)
Albumin: 3.8 g/dL (ref 3.5–5.2)
Alkaline Phosphatase: 64 U/L (ref 39–117)
BUN: 11 mg/dL (ref 6–23)
CO2: 35 mEq/L — ABNORMAL HIGH (ref 19–32)
Calcium: 9.7 mg/dL (ref 8.4–10.5)
Chloride: 100 mEq/L (ref 96–112)
Creatinine, Ser: 0.91 mg/dL (ref 0.40–1.20)
GFR: 59.56 mL/min — ABNORMAL LOW (ref >60.00)
Glucose, Bld: 107 mg/dL — ABNORMAL HIGH (ref 70–99)
Potassium: 4.5 mEq/L (ref 3.5–5.1)
Sodium: 140 mEq/L (ref 135–145)
Total Bilirubin: 0.5 mg/dL (ref 0.2–1.2)
Total Protein: 6.8 g/dL (ref 6.0–8.3)

## 2020-02-02 LAB — TSH: TSH: 0.92 u[IU]/mL (ref 0.35–4.50)

## 2020-02-02 LAB — LIPID PANEL
Cholesterol: 116 mg/dL (ref 0–200)
HDL: 50 mg/dL (ref >39.00)
LDL Cholesterol: 48 mg/dL (ref 0–99)
NonHDL: 65.71
Total CHOL/HDL Ratio: 2
Triglycerides: 89 mg/dL (ref 0.0–149.0)
VLDL: 17.8 mg/dL (ref 0.0–40.0)

## 2020-02-02 LAB — HEMOGLOBIN A1C: Hgb A1c MFr Bld: 6 % (ref 4.6–6.5)

## 2020-02-02 LAB — VITAMIN D 25 HYDROXY (VIT D DEFICIENCY, FRACTURES): VITD: 36.85 ng/mL (ref 30.00–100.00)

## 2020-02-02 NOTE — Telephone Encounter (Signed)
Called patient to complete her Medicare Wellness visit. Patient stated that she had to come to the office to get her labs done and since then it has taken a lot out of her. She is now short of breath and feeling tired. States she has COPD. She wanted to cancel the appointment because she can't do a lot of talking right now. Appointment was cancelled and I advised her that the provider may complete this visit at her upcoming appointment on 02/07/2020.

## 2020-02-03 NOTE — Telephone Encounter (Signed)
Noted. Thanks.

## 2020-02-07 ENCOUNTER — Other Ambulatory Visit: Payer: Self-pay

## 2020-02-07 ENCOUNTER — Encounter: Payer: Self-pay | Admitting: Family Medicine

## 2020-02-07 ENCOUNTER — Ambulatory Visit (INDEPENDENT_AMBULATORY_CARE_PROVIDER_SITE_OTHER): Payer: Medicare HMO | Admitting: Family Medicine

## 2020-02-07 ENCOUNTER — Other Ambulatory Visit: Payer: Self-pay | Admitting: Family Medicine

## 2020-02-07 VITALS — Ht 62.0 in | Wt 116.0 lb

## 2020-02-07 DIAGNOSIS — G4700 Insomnia, unspecified: Secondary | ICD-10-CM

## 2020-02-07 DIAGNOSIS — M545 Low back pain, unspecified: Secondary | ICD-10-CM

## 2020-02-07 DIAGNOSIS — E785 Hyperlipidemia, unspecified: Secondary | ICD-10-CM

## 2020-02-07 DIAGNOSIS — M25561 Pain in right knee: Secondary | ICD-10-CM | POA: Diagnosis not present

## 2020-02-07 DIAGNOSIS — M25562 Pain in left knee: Secondary | ICD-10-CM | POA: Diagnosis not present

## 2020-02-07 DIAGNOSIS — J441 Chronic obstructive pulmonary disease with (acute) exacerbation: Secondary | ICD-10-CM

## 2020-02-07 DIAGNOSIS — G8929 Other chronic pain: Secondary | ICD-10-CM

## 2020-02-07 DIAGNOSIS — I4891 Unspecified atrial fibrillation: Secondary | ICD-10-CM

## 2020-02-07 DIAGNOSIS — Z0000 Encounter for general adult medical examination without abnormal findings: Secondary | ICD-10-CM

## 2020-02-07 DIAGNOSIS — K219 Gastro-esophageal reflux disease without esophagitis: Secondary | ICD-10-CM | POA: Diagnosis not present

## 2020-02-07 MED ORDER — PREDNISONE 20 MG PO TABS: ORAL_TABLET | ORAL | 0 refills | Status: DC

## 2020-02-07 MED ORDER — TRAZODONE HCL 50 MG PO TABS: 25.0000 mg | ORAL_TABLET | Freq: Every evening | ORAL | 3 refills | Status: DC | PRN

## 2020-02-07 MED ORDER — TRAMADOL HCL 50 MG PO TABS: 50.0000 mg | ORAL_TABLET | Freq: Two times a day (BID) | ORAL | 2 refills | Status: DC | PRN

## 2020-02-07 MED ORDER — METOPROLOL SUCCINATE ER 50 MG PO TB24: 50.0000 mg | ORAL_TABLET | Freq: Every day | ORAL | 3 refills | Status: DC

## 2020-02-07 MED ORDER — AMOXICILLIN-POT CLAVULANATE 875-125 MG PO TABS: 1.0000 | ORAL_TABLET | Freq: Two times a day (BID) | ORAL | 0 refills | Status: DC

## 2020-02-07 MED ORDER — APIXABAN 5 MG PO TABS: 5.0000 mg | ORAL_TABLET | Freq: Two times a day (BID) | ORAL | 3 refills | Status: DC

## 2020-02-07 MED ORDER — ATORVASTATIN CALCIUM 20 MG PO TABS: 20.0000 mg | ORAL_TABLET | Freq: Every day | ORAL | 3 refills | Status: DC

## 2020-02-07 MED ORDER — DILTIAZEM HCL ER COATED BEADS 120 MG PO CP24: 120.0000 mg | ORAL_CAPSULE | Freq: Every day | ORAL | 3 refills | Status: DC

## 2020-02-07 NOTE — Progress Notes (Signed)
Interactive audio and video telecommunications were attempted between this provider and patient, however failed, due to patient having technical difficulties OR patient did not have access to video capability.  We continued and completed visit with audio only.   Virtual Visit via Telephone Note  I connected with patient on 02/07/20  at 2:20 PM  by telephone and verified that I am speaking with the correct person using two identifiers.  Location of patient: home  Location of MD: [place] [facility] Domin[place] of referring provider (if blank then none associated): Names per persons and role in encounter:  MD: Ferd Hibbs, Patient: Dominique Clark.    I discussed the limitations, risks, security and privacy concerns of performing an evaluation and management service by telephone and the availability of in person appointments. I also discussed with the patient that there may be a patient responsible charge related to this service. The patient expressed understanding and agreed to proceed.  I have personally reviewed the Medicare Annual Wellness questionnaire and have noted 1. The patient's medical and social history 2. Their use of alcohol, tobacco or illicit drugs 3. Their current medications and supplements 4. The patient's functional ability including ADL's, fall risks, home safety risks and hearing or visual             impairment. 5. Diet and physical activities 6. Evidence for depression or mood disorders  The patients weight, height, BMI have been recorded in the chart and visual acuity is per eye clinic.  I have made referrals, counseling and provided education to the patient based review of the Clark and I have provided the pt with a written personalized care plan for preventive services.  Provider list updated- see scanned forms.  Routine anticipatory guidance given to patient.  See health maintenance. The possibility exists that previously documented standard health maintenance  information may have been brought forward from a previous encounter into this note.  If needed, that same information has been updated to reflect the current situation based on today's encounter.    Flu 2020 Shingles discussed with patient.  Defer until she has Covid vaccine. PNA up-to-date. Tetanus discussed with patient. Covid vaccine discussed with patient. Colon cancer screening declined by patient. Breast cancer screening declined by patient discussed. Bone density test declined by patient.  Discussed Advance directive- daughter Mauricia Area and son Ortencia Askari equally designated if patient were incapacitated.  Cognitive function addressed- see scanned forms- and if abnormal then additional documentation follows.   covid vaccine d/w pt.  Defer for now given COPD exacerbation.    COPD.  Still using duoneb BID at baseline.  Still using 3L O2 on long cord at baseline and 2L on short line/tank.  Still with yellow and green sputum.  Some wheeze.  Recently with change in sputum and wheeze.  No prednisone since 09/2019.  Discussed options.  Still on tramadol for knee pain and back pain, with relief.  No ADE on med.  She is putting up with back pain at baseline.    She is walking with a walker as needed at baseline.    Insomnia.  Still taking 25mg  trazodone prn, with relief.  No ADE on med.  Not used nightly.    Elevated Cholesterol: Using medications without problems: yes Muscle aches: likely not from statin.   Diet compliance: encouraged.   Exercise: limited at baseline.   AF.   BP controlled on home check per patient report, she didn't recall exact number but 120s/60s.  She isn't lightheaded.  Taking lasix about 2 times a week.  No bleeding other than 1 episode with epistaxis.  No exertional chest pain.  We talked about anticoagulation and we agreed that it was reasonable to continue as is   She has some occ GERD sx that responds nexium.    PMH and SH reviewed  Meds, vitals,  and allergies reviewed.   ROS: Per HPI.  Unless specifically indicated otherwise in HPI, the patient denies:  General: fever. Eyes: acute vision changes ENT: sore throat Cardiovascular: chest pain Respiratory: SOB GI: vomiting GU: dysuria Musculoskeletal: acute back pain Derm: acute rash Neuro: acute motor dysfunction Psych: worsening mood Endocrine: polydipsia Heme: bleeding Allergy: hayfever  Observations/Objective: nad Speech wnl.    Assessment and Plan:  Flu 2020 Shingles discussed with patient.  Defer until she has Covid vaccine. PNA up-to-date. Tetanus discussed with patient. Covid vaccine discussed with patient. Colon cancer screening declined by patient. Breast cancer screening declined by patient discussed. Bone density test declined by patient.  Discussed Advance directive- daughter [name] and son [name] equally designated if patient were incapacitated.  Cognitive function addressed- see scanned forms- and if abnormal then additional documentation follows.   covid vaccine d/w pt.  Defer for now given COPD exacerbation.    COPD.  Still using duoneb BID at baseline.  Still using 3L O2 on long cord at baseline and 2L on short line/tank.  Still with yellow and green sputum.  Some wheeze.  Recently with change in sputum and wheeze.  No prednisone since 09/2019.  Discussed options.  Start Augmentin and prednisone and she will update me as needed.  Still on tramadol for knee pain and back pain, with relief.  No ADE on med.  She is putting up with back pain at baseline.  Continue as is.  She agrees.  She is walking with a walker as needed at baseline.  Cautions discussed with patient.  Insomnia.  Still taking 25mg  trazodone prn, with relief.  No ADE on med.  Not used nightly.  Continue as is.  Elevated Cholesterol: Tolerating statin.  Lipids controlled.  Continue as is.  She agrees.  AF.   BP controlled on home check per patient report, she didn't  recall exact number but 120s/60s.  She isn't lightheaded.  Taking lasix about 2 times a week.  No bleeding other than 1 episode with epistaxis.  No exertional chest pain.  We talked about anticoagulation and we agreed that it was reasonable to continue as is.   She has some occ GERD sx that responds nexium.  Continue as is.  She agrees.  Follow Up Instructions: See Clark.   I discussed the assessment and treatment plan with the patient. The patient was provided an opportunity to ask questions and all were answered. The patient agreed with the plan and demonstrated an understanding of the instructions.   The patient was advised to call back or seek an in-person evaluation if the symptoms worsen or if the condition fails to improve as anticipated.  I provided 30 minutes of non-face-to-face time during this encounter.  Thandiwe Stain, MD  Health Maintenance  Topic Date Due  . Samul Dada  12/29/2017  . DEXA SCAN  02/09/2021 (Originally 08/02/2005)  . INFLUENZA VACCINE  Completed  . PNA vac Low Risk Adult  Completed

## 2020-02-10 DIAGNOSIS — E785 Hyperlipidemia, unspecified: Secondary | ICD-10-CM | POA: Insufficient documentation

## 2020-02-10 NOTE — Assessment & Plan Note (Signed)
She has some occ GERD sx that responds nexium.  Continue as is.  She agrees.

## 2020-02-10 NOTE — Assessment & Plan Note (Signed)
BP controlled on home check per patient report, she didn't recall exact number but 120s/60s.  She isn't lightheaded.  Taking lasix about 2 times a week.  No bleeding other than 1 episode with epistaxis.  No exertional chest pain.  We talked about anticoagulation and we agreed that it was reasonable to continue as is.

## 2020-02-10 NOTE — Assessment & Plan Note (Signed)
Still using duoneb BID at baseline.  Still using 3L O2 on long cord at baseline and 2L on short line/tank.  Still with yellow and green sputum.  Some wheeze.  Recently with change in sputum and wheeze.  No prednisone since 09/2019.  Discussed options.  Start Augmentin and prednisone and she will update me as needed.

## 2020-02-10 NOTE — Assessment & Plan Note (Signed)
Flu 2020 Shingles discussed with patient.  Defer until she has Covid vaccine. PNA up-to-date. Tetanus discussed with patient. Covid vaccine discussed with patient. Colon cancer screening declined by patient. Breast cancer screening declined by patient discussed. Bone density test declined by patient.  Discussed Advance directive- daughter Mauricia Area and son Lesle Faron equally designated if patient were incapacitated.  Cognitive function addressed- see scanned forms- and if abnormal then additional documentation follows.   covid vaccine d/w pt.  Defer for now given COPD exacerbation.

## 2020-02-10 NOTE — Assessment & Plan Note (Signed)
Still taking 25mg  trazodone prn, with relief.  No ADE on med.  Not used nightly.  Continue as is.

## 2020-02-10 NOTE — Assessment & Plan Note (Signed)
Tolerating statin.  Lipids controlled.  Continue as is.  She agrees.

## 2020-02-10 NOTE — Assessment & Plan Note (Signed)
Still on tramadol for knee pain and back pain, with relief.  No ADE on med.  She is putting up with back pain at baseline.  Continue as is.  She agrees.  

## 2020-02-10 NOTE — Assessment & Plan Note (Signed)
Still on tramadol for knee pain and back pain, with relief.  No ADE on med.  She is putting up with back pain at baseline.  Continue as is.  She agrees.

## 2020-02-20 ENCOUNTER — Other Ambulatory Visit: Payer: Self-pay | Admitting: Family Medicine

## 2020-02-20 DIAGNOSIS — I5033 Acute on chronic diastolic (congestive) heart failure: Secondary | ICD-10-CM

## 2020-02-20 DIAGNOSIS — J441 Chronic obstructive pulmonary disease with (acute) exacerbation: Secondary | ICD-10-CM

## 2020-02-24 ENCOUNTER — Telehealth: Payer: Self-pay

## 2020-02-24 DIAGNOSIS — R197 Diarrhea, unspecified: Secondary | ICD-10-CM

## 2020-02-24 NOTE — Telephone Encounter (Signed)
If she isn't passing blood or mucous, and if she doesn't have a fever, then I would start an OTC probiotic.  If she has any of those sx or other concerns, then let me know.  Thanks.

## 2020-02-24 NOTE — Telephone Encounter (Signed)
I would get a C diff test done.  I put in the order.  Please get a stool sample when possible.  If worse in the meantime then let me know/call triage.  Thanks.

## 2020-02-24 NOTE — Telephone Encounter (Signed)
Patient has been passing mucous for the duration of the diarrhea (2 weeks), no blood, no fever.

## 2020-02-24 NOTE — Telephone Encounter (Signed)
Patient advised.

## 2020-02-24 NOTE — Telephone Encounter (Signed)
Pt called the office stating she has had diarrhea for about 2 weeks. Says she has been taking Pepto without relief. It started after she started taking the Augmentin. She is not sure when she finished the Augmentin. Thinks it was this week. She is asking if there is anything that can be called in to [facility].

## 2020-02-27 ENCOUNTER — Other Ambulatory Visit (INDEPENDENT_AMBULATORY_CARE_PROVIDER_SITE_OTHER): Payer: Medicare HMO

## 2020-02-27 DIAGNOSIS — J441 Chronic obstructive pulmonary disease with (acute) exacerbation: Secondary | ICD-10-CM | POA: Diagnosis not present

## 2020-02-27 DIAGNOSIS — R197 Diarrhea, unspecified: Secondary | ICD-10-CM | POA: Diagnosis not present

## 2020-02-28 ENCOUNTER — Other Ambulatory Visit: Payer: Self-pay | Admitting: Family Medicine

## 2020-02-28 LAB — C. DIFFICILE GDH AND TOXIN A/B
GDH ANTIGEN: NOT DETECTED
MICRO NUMBER:: 10199634
SPECIMEN QUALITY:: ADEQUATE
TOXIN A AND B: DETECTED

## 2020-02-28 LAB — CLOSTRIDIUM DIFFICILE TOXIN B, QUALITATIVE, REAL-TIME PCR: Toxigenic C. Difficile by PCR: DETECTED — AB

## 2020-02-28 MED ORDER — VANCOMYCIN HCL 125 MG PO CAPS: 125.0000 mg | ORAL_CAPSULE | Freq: Four times a day (QID) | ORAL | 0 refills | Status: DC

## 2020-03-08 ENCOUNTER — Other Ambulatory Visit: Payer: Self-pay | Admitting: Family Medicine

## 2020-03-20 ENCOUNTER — Other Ambulatory Visit: Payer: Self-pay | Admitting: *Deleted

## 2020-03-20 MED ORDER — ALBUTEROL SULFATE HFA 108 (90 BASE) MCG/ACT IN AERS: 1.0000 | INHALATION_SPRAY | RESPIRATORY_TRACT | 1 refills | Status: DC | PRN

## 2020-03-29 DIAGNOSIS — J441 Chronic obstructive pulmonary disease with (acute) exacerbation: Secondary | ICD-10-CM | POA: Diagnosis not present

## 2020-04-02 ENCOUNTER — Encounter: Payer: Self-pay | Admitting: Family Medicine

## 2020-04-02 ENCOUNTER — Telehealth: Payer: Self-pay

## 2020-04-02 NOTE — Telephone Encounter (Signed)
Spoke to patient and form mailed as directed.

## 2020-04-02 NOTE — Telephone Encounter (Signed)
Pt left v/m requesting cb with status of handicap placard form; I did not see recent phone note about a handicap placard.Please advise.

## 2020-04-13 ENCOUNTER — Other Ambulatory Visit: Payer: Self-pay | Admitting: *Deleted

## 2020-04-13 MED ORDER — FLUTICASONE PROPIONATE 50 MCG/ACT NA SUSP: 2.0000 | Freq: Every day | NASAL | 3 refills | Status: DC | PRN

## 2020-04-20 ENCOUNTER — Other Ambulatory Visit: Payer: Self-pay | Admitting: Family Medicine

## 2020-04-20 MED ORDER — FLUTICASONE-SALMETEROL 250-50 MCG/DOSE IN AEPB: 1.0000 | INHALATION_SPRAY | Freq: Two times a day (BID) | RESPIRATORY_TRACT | 3 refills | Status: DC

## 2020-04-20 NOTE — Progress Notes (Signed)
Check with patient.  If she has been using her albuterol more often then I think it makes sense to continue with Symbicort or a reasonable substitute.  Note from insurance company states that Valmy will be cheaper for her.  This is a reasonable substitute.  If she agrees, then please send prescription below.  She needs to rinse after use.  Update me if this is not helping her.  Would continue albuterol as needed.  Thanks.

## 2020-04-20 NOTE — Progress Notes (Signed)
04/20/2020  Patient advised that Monte Fantasia has been sent to the pharmacy.  Patient was appreciative.Patient states she has been having to use the Albuterol more lately, likely because of the pollen.  Ninetta Lights, CMA

## 2020-04-28 DIAGNOSIS — J441 Chronic obstructive pulmonary disease with (acute) exacerbation: Secondary | ICD-10-CM | POA: Diagnosis not present

## 2020-04-30 ENCOUNTER — Other Ambulatory Visit: Payer: Self-pay | Admitting: Family Medicine

## 2020-04-30 DIAGNOSIS — I5033 Acute on chronic diastolic (congestive) heart failure: Secondary | ICD-10-CM

## 2020-04-30 DIAGNOSIS — J441 Chronic obstructive pulmonary disease with (acute) exacerbation: Secondary | ICD-10-CM

## 2020-05-16 ENCOUNTER — Encounter: Payer: Self-pay | Admitting: Family

## 2020-05-16 ENCOUNTER — Telehealth (INDEPENDENT_AMBULATORY_CARE_PROVIDER_SITE_OTHER): Payer: Medicare HMO | Admitting: Family

## 2020-05-16 VITALS — Ht 70.0 in | Wt 110.0 lb

## 2020-05-16 DIAGNOSIS — Z7901 Long term (current) use of anticoagulants: Secondary | ICD-10-CM

## 2020-05-16 DIAGNOSIS — I10 Essential (primary) hypertension: Secondary | ICD-10-CM

## 2020-05-16 DIAGNOSIS — I480 Paroxysmal atrial fibrillation: Secondary | ICD-10-CM

## 2020-05-16 NOTE — Progress Notes (Signed)
Virtual Visit via Telephone Note   This visit type was conducted due to national recommendations for restrictions regarding the COVID-19 Pandemic (e.g. social distancing) in an effort to limit this patient's exposure and mitigate transmission in our community.  Due to her co-morbid illnesses, this patient is at least at moderate risk for complications without adequate follow up.  This format is felt to be most appropriate for this patient at this time.  The patient did not have access to video technology/had technical difficulties with video requiring transitioning to audio format only (telephone).  All issues noted in this document were discussed and addressed.  No physical exam could be performed with this format.  Please refer to the patient's chart for her  consent to telehealth for [facility].   The patient was identified using 2 identifiers.  Date:  05/16/2020   ID:  Dominique Clark, DOB [birth year], MRN [id number]  Patient Location: Home Provider Location: Office  PCP:  [name], MD  Cardiologist:  [name], MD  Electrophysiologist:  None   Evaluation Performed:  Follow-Up Visit  Chief Complaint:  Follow up of atrial fibrillation  History of Present Illness:    Dominique Clark is a 79 y.o. female with history of COPD on home O2, atrial fibrillation, TIA, CKD, HLD, diastolic heart failure, HTN.  She was last seen via telemedicine by Dr. Hebert Soho 05/16/2019.  Presents for follow-up of her paroxysmal atrial fibrillation.  Labs 02/02/2020 total cholesterol 116, HDL 50, LDL 48, triglycerides 89.  Normal liver function.  Creatinine 0.91, GFR 59.56.  Hemoglobin 12.2.  She reports feeling very well. Has received her first COVID vaccine. Enjoys spending time with her chihuahua.   Reports no shortness of breath at rest. Tells me her dyspnea on exertion has improved since being back on her inhalers for COPD. Reports no chest pain, pressure, or tightness. No orthopnea, PND. Reports  no palpitations. Tells me she takes her Lasix about three times per week for swelling and it is well controlled.. Does keep her feet elevated when she sits. No formal exercise regimen. Endorses eating a low sodium diet.   The patient does not have symptoms concerning for COVID-19 infection (fever, chills, cough, or new shortness of breath).    Past Medical History:  Diagnosis Date  . AF (atrial fibrillation) (HCC)   . Asthma   . COPD (chronic obstructive pulmonary disease) (HCC)   . Diabetes mellitus    type 2  . Diverticulosis    s/p colonoscopy  . Gastritis    via EGD  . Hiatal hernia    s/p dilation 2004  . Osteopenia   . Stress incontinence, female   . TIA (transient ischemic attack)    Past Surgical History:  Procedure Laterality Date  . BLADDER REPAIR  9/04   bladder tack   . BREAST CYST EXCISION Right 1972  . CATARACT EXTRACTION    . TONSILLECTOMY     age 57  . TOTAL ABDOMINAL HYSTERECTOMY      Current Meds  Medication Sig  . albuterol (VENTOLIN HFA) 108 (90 Base) MCG/ACT inhaler Inhale 1-2 puffs into the lungs every 4 (four) hours as needed for wheezing. Okay to fill with ventolin or proventil if needed.  Marland Kitchen apixaban (ELIQUIS) 5 MG TABS tablet Take 1 tablet (5 mg total) by mouth 2 (two) times daily.  Marland Kitchen atorvastatin (LIPITOR) 20 MG tablet Take 1 tablet (20 mg total) by mouth daily.  Marland Kitchen diltiazem (CARDIZEM CD) 120 MG  24 hr capsule Take 1 capsule (120 mg total) by mouth at bedtime.  Marland Kitchen DOK 100 MG capsule TAKE 1 CAPSULE TWICE DAILY  . esomeprazole (NEXIUM) 20 MG capsule Take 20 mg by mouth daily at 12 noon.  Marland Kitchen FEROSUL 325 (65 Fe) MG tablet TAKE 1 TABLET (325 MG TOTAL) BY MOUTH 2 (TWO) TIMES DAILY WITH A MEAL.  . fluticasone (FLONASE) 50 MCG/ACT nasal spray Place 2 sprays into both nostrils daily as needed (seasonal allergies).  . Fluticasone-Salmeterol (WIXELA INHUB) 250-50 MCG/DOSE AEPB Inhale 1 puff into the lungs in the morning and at bedtime. Rinse after use.  .  furosemide (LASIX) 20 MG tablet TAKE 1 TABLET (20 MG TOTAL) BY MOUTH DAILY AS NEEDED FOR FLUID (TAKE WITH POTASSIUM).  Marland Kitchen ipratropium-albuterol (DUONEB) 0.5-2.5 (3) MG/3ML SOLN INHALE THE CONTENTS OF 1 VIAL VIA NEBULIZER EVERY 6 HOURS AS NEEDED  . metoprolol succinate (TOPROL-XL) 50 MG 24 hr tablet Take 1 tablet (50 mg total) by mouth daily. Take with or immediately following a meal.  . NON FORMULARY Oxygen 3 liters 24/7  . potassium chloride (KLOR-CON) 10 MEQ tablet TAKE 1 TABLET EVERY DAY AS NEEDED (TAKE WITH LASIX).  Marland Kitchen traMADol (ULTRAM) 50 MG tablet Take 1-2 tablets (50-100 mg total) by mouth every 12 (twelve) hours as needed.  . traZODone (DESYREL) 50 MG tablet Take 0.5-1 tablets (25-50 mg total) by mouth at bedtime as needed for sleep.     Allergies:   Doxycycline   Social History   Tobacco Use  . Smoking status: Former Smoker    Types: Cigarettes    Quit date: 12/29/1988    Years since quitting: 31.4  . Smokeless tobacco: Never Used  . Tobacco comment: 20 or more years   Substance Use Topics  . Alcohol use: No    Alcohol/week: 0.0 standard drinks  . Drug use: No    Family Hx: The patient's family history includes Stroke in her mother. There is no history of Breast cancer or Colon cancer.  ROS:   Please see the history of present illness.    Review of Systems  Constitution: Negative for chills, fever and malaise/fatigue.  Cardiovascular: Positive for dyspnea on exertion. Negative for chest pain, irregular heartbeat, leg swelling, near-syncope, orthopnea, palpitations and syncope.  Respiratory: Negative for cough, shortness of breath and wheezing.   Gastrointestinal: Negative for melena, nausea and vomiting.  Genitourinary: Negative for hematuria.  Neurological: Negative for dizziness, light-headedness and weakness.   All other systems reviewed and are negative.  Prior CV studies:   The following studies were reviewed today:  Echo 11/2017 Left ventricle: The cavity size  was normal. There was mild focal    basal hypertrophy of the septum. Systolic function was normal.    The estimated ejection fraction was in the range of 55% to 60%.    Wall motion was normal; there were no regional wall motion    abnormalities. Features are consistent with a pseudonormal left    ventricular filling pattern, with concomitant abnormal relaxation    and increased filling pressure (grade 2 diastolic dysfunction).  - Aortic valve: Trileaflet; mildly thickened, mildly calcified    leaflets.  - Mitral valve: There was mild to moderate regurgitation.  - Tricuspid valve: There was moderate regurgitation.  - Pulmonary arteries: Systolic pressure was mildly increased. PA    peak pressure: 38 mm Hg (S).   Labs/Other Tests and Data Reviewed:    EKG:  No ECG reviewed.  Recent Labs: 02/02/2020: ALT 8;  BUN 11; Creatinine, Ser 0.91; Hemoglobin 12.2; Platelets 220.0; Potassium 4.5; Sodium 140; TSH 0.92   Recent Lipid Panel Lab Results  Component Value Date/Time   CHOL 116 02/02/2020 08:18 AM   TRIG 89.0 02/02/2020 08:18 AM   HDL 50.00 02/02/2020 08:18 AM   CHOLHDL 2 02/02/2020 08:18 AM   LDLCALC 48 02/02/2020 08:18 AM   LDLDIRECT 87.9 07/24/2014 08:28 AM    Wt Readings from Last 3 Encounters:  05/16/20 110 lb (49.9 kg)  02/07/20 116 lb (52.6 kg)  09/09/18 136 lb 12 oz (62 kg)     Objective:    Vital Signs:  Ht 5\' 10"  (1.778 m)   Wt 110 lb (49.9 kg)   BMI 15.78 kg/m    VITAL SIGNS:  reviewed  ASSESSMENT & PLAN:    1. PAF - Reports no recurrent palpitations. Appropriately anticoagulated on Eliquis 5mg  BID - does not criteria for dose reduction. Reports no bleeding complications. Continue Toprol 50mg  daily, Diltiazem 120mg  daily.   2. COPD on home oxygen - Has been back on her inhalers after receiving assistance to get them for free. Reports improvement in her dyspnea on exertion. Reports no symptoms of exacerbation. Continue to follow with primary care.   3. HTN - BP  cuff at home broke and she has not purchased new one. Encouraged to purchase new BP cuff. Continue present antihypertensive regimen.   4. TIA - No recurrent symptoms. Continue statin. No aspirin secondary to chronic anticoagulation.   5. Chronic diastolic heart failure - Reports volume status well managed on Lasix 20mg  three times per week. No indication for repeat echocardiogram at this time. Continue Toprol 50mg  daily. Continue low sodium diet.   COVID-19 Education: The signs and symptoms of COVID-19 were discussed with the patient and how to seek care for testing (follow up with PCP or arrange E-visit).  The importance of social distancing was discussed today.  Time:   Today, I have spent 15 minutes with the patient with telehealth technology discussing the above problems.     Medication Adjustments/Labs and Tests Ordered: Current medicines are reviewed at length with the patient today.  Concerns regarding medicines are outlined above.   Tests Ordered: No orders of the defined types were placed in this encounter.   Medication Changes: No orders of the defined types were placed in this encounter.   Follow Up:  In Person in 1 year(s)  Signed, , NP  05/16/2020 11:22 AM    [facility]

## 2020-05-16 NOTE — Patient Instructions (Addendum)
Medication Instructions:  No medication changes today.   *If you need a refill on your cardiac medications before your next appointment, please call your pharmacy*   Lab Work: No lab work today. Your lab work from February with Dr. Para March shows good blood counts, good cholesterol, normal liver/kidney function. Great result!  Testing/Procedures: None ordered today.   Follow-Up: At [facility], you and your health needs are our priority.  As part of our continuing mission to provide you with exceptional heart care, we have created designated Provider Care Teams.  These Care Teams include your primary Cardiologist (physician) and Advanced Practice Providers (APPs -  Physician Assistants and Nurse Practitioners) who all work together to provide you with the care you need, when you need it.  We recommend signing up for the patient portal called "MyChart".  Sign up information is provided on this After Visit Summary.  MyChart is used to connect with patients for Virtual Visits (Telemedicine).  Patients are able to view lab/test results, encounter notes, upcoming appointments, etc.  Non-urgent messages can be sent to your provider as well.   To learn more about what you can do with MyChart, go to [email or web address].    Your next appointment:   1 year(s) *we will send you a letter to remind you*  The format for your next appointment:   In Person  Provider:   You may see [name], MD or one of the following Advanced Practice Providers on your designated Care Team:    Nicolasa Ducking, NP  Eula Listen, PA-C  Marisue Ivan, PA-C  Gillian Shields, NP   Other Instructions  Hello Miss Craver,  It was a pleasure to speak with you today!  I am so glad that your heart is doing well! Sounds like your metoprolol and diltiazem are keeping the heart rate and rhythm well controlled.  Continue to use your Lasix 3 times per week for swelling.  Continue to keep your feet up as that will  help with your swelling as well.  Continue low-sodium diet.  If you have any problems do not hesitate to reach out to our office.    I would recommend purchasing a blood pressure cuff just to monitor it a few times per week at home.  We typically like the arm cuffs better than the wrist cuffs as they are more accurate.  Our goal is for your blood pressure to be consistently less than 130/80.  As always if you have chest pain, pressure, tightness or recurrent atrial fibrillation or blood in your urine or stool please contact our office.  Best, Alver Sorrow, NP

## 2020-05-29 DIAGNOSIS — J441 Chronic obstructive pulmonary disease with (acute) exacerbation: Secondary | ICD-10-CM | POA: Diagnosis not present

## 2020-06-28 DIAGNOSIS — J441 Chronic obstructive pulmonary disease with (acute) exacerbation: Secondary | ICD-10-CM | POA: Diagnosis not present

## 2020-07-09 ENCOUNTER — Other Ambulatory Visit: Payer: Self-pay | Admitting: Family Medicine

## 2020-07-09 DIAGNOSIS — I5033 Acute on chronic diastolic (congestive) heart failure: Secondary | ICD-10-CM

## 2020-07-09 DIAGNOSIS — J441 Chronic obstructive pulmonary disease with (acute) exacerbation: Secondary | ICD-10-CM

## 2020-07-10 NOTE — Telephone Encounter (Signed)
Sent. Thanks.   

## 2020-07-24 ENCOUNTER — Other Ambulatory Visit: Payer: Self-pay | Admitting: Family Medicine

## 2020-07-29 DIAGNOSIS — J441 Chronic obstructive pulmonary disease with (acute) exacerbation: Secondary | ICD-10-CM | POA: Diagnosis not present

## 2020-08-15 ENCOUNTER — Telehealth: Payer: Self-pay | Admitting: Family Medicine

## 2020-08-15 NOTE — Telephone Encounter (Signed)
Patient had a form dropped off. Its placed on cart please call patient with any questions at [phone].

## 2020-08-16 NOTE — Telephone Encounter (Signed)
I'll work on the hard copy.  Thanks.  

## 2020-08-21 ENCOUNTER — Other Ambulatory Visit: Payer: Self-pay | Admitting: *Deleted

## 2020-08-21 NOTE — Telephone Encounter (Signed)
Faxed refill request. Tramadol Last office visit:   03/06/2020 CPE Last Filled:    120 tablet 2 02/07/2020

## 2020-08-22 MED ORDER — TRAMADOL HCL 50 MG PO TABS: 50.0000 mg | ORAL_TABLET | Freq: Two times a day (BID) | ORAL | 2 refills | Status: DC | PRN

## 2020-08-22 NOTE — Telephone Encounter (Signed)
Sent. Thanks.   

## 2020-08-29 ENCOUNTER — Other Ambulatory Visit: Payer: Self-pay | Admitting: Family Medicine

## 2020-08-29 DIAGNOSIS — J441 Chronic obstructive pulmonary disease with (acute) exacerbation: Secondary | ICD-10-CM | POA: Diagnosis not present

## 2020-09-28 DIAGNOSIS — J441 Chronic obstructive pulmonary disease with (acute) exacerbation: Secondary | ICD-10-CM | POA: Diagnosis not present

## 2020-10-18 ENCOUNTER — Other Ambulatory Visit: Payer: Self-pay | Admitting: *Deleted

## 2020-10-18 MED ORDER — FLUTICASONE PROPIONATE 50 MCG/ACT NA SUSP: 2.0000 | Freq: Every day | NASAL | 3 refills | Status: DC | PRN

## 2020-10-29 DIAGNOSIS — J441 Chronic obstructive pulmonary disease with (acute) exacerbation: Secondary | ICD-10-CM | POA: Diagnosis not present

## 2021-01-09 ENCOUNTER — Other Ambulatory Visit: Payer: Self-pay | Admitting: Family Medicine

## 2021-01-23 ENCOUNTER — Other Ambulatory Visit: Payer: Self-pay

## 2021-01-23 MED ORDER — ATORVASTATIN CALCIUM 20 MG PO TABS: 20.0000 mg | ORAL_TABLET | Freq: Every day | ORAL | 0 refills | Status: DC

## 2021-02-05 ENCOUNTER — Telehealth: Payer: Self-pay | Admitting: *Deleted

## 2021-02-05 ENCOUNTER — Other Ambulatory Visit: Payer: Self-pay

## 2021-02-05 MED ORDER — APIXABAN 5 MG PO TABS: 5.0000 mg | ORAL_TABLET | Freq: Two times a day (BID) | ORAL | 3 refills | Status: DC

## 2021-02-05 MED ORDER — TRAMADOL HCL 50 MG PO TABS: 50.0000 mg | ORAL_TABLET | Freq: Two times a day (BID) | ORAL | 2 refills | Status: DC | PRN

## 2021-02-05 NOTE — Telephone Encounter (Signed)
Patient called stating that [facility] told her that they sent a refill request over for her Tramadol. No record in Epic. Patient requested that the refill be sent to [facility]. Last refill 08/22/20 #120/2 Last office visit 02/07/20

## 2021-02-05 NOTE — Telephone Encounter (Signed)
Sent. Thanks.  Needs OV this spring when possible.

## 2021-02-12 NOTE — Telephone Encounter (Signed)
Thanks. Noted.

## 2021-02-12 NOTE — Telephone Encounter (Signed)
Called pt she is going to call back in March to do appointment

## 2021-02-13 ENCOUNTER — Other Ambulatory Visit: Payer: Self-pay | Admitting: Family Medicine

## 2021-04-10 ENCOUNTER — Telehealth: Payer: Self-pay | Admitting: *Deleted

## 2021-04-10 ENCOUNTER — Other Ambulatory Visit: Payer: Self-pay | Admitting: Family Medicine

## 2021-04-10 DIAGNOSIS — J441 Chronic obstructive pulmonary disease with (acute) exacerbation: Secondary | ICD-10-CM

## 2021-04-10 NOTE — Telephone Encounter (Signed)
Please send order for supplies, dx: Chronic obstructive pulmonary disease, unspecified COPD type (HCC)  J44.9   Thanks.

## 2021-04-10 NOTE — Telephone Encounter (Signed)
Patient called stating that she has a nebulizer machine  that she uses that was prescribed by a pulmonologist. Patient stated that she does not remember his name but he is no longer in [place]. Patient stated that she needs supplies. Patient stated that she uses the nebulizer treatment when her allergies flare-up during this time of year because of all of the pollen. Patient stated that the unit was supplied by [facility]. Patient stated that she needs the tubing and unit that holds the medication that she attaches to the machine. Patient stated that she needs this order sent to  [facility] at fax #[phone] and their telephone number is [phone].  Patient stated that she can use the treatment up to four times a day. Patient stated that she currently has some supplies on hand and is not out.

## 2021-04-11 NOTE — Telephone Encounter (Signed)
Order and OV notes have been refaxed

## 2021-04-11 NOTE — Addendum Note (Signed)
Addended by: Wendie Simmer B on: 04/11/2021 11:02 AM   Modules accepted: Orders

## 2021-04-11 NOTE — Telephone Encounter (Signed)
Orders done and faxed to [facility]

## 2021-04-11 NOTE — Telephone Encounter (Signed)
Dominique Clark with [employer] left v/m requesting a new rx for nebulizer with providers NPI # and office notes. Requested info to be faxed to [phone]. Sending note to [facility].

## 2021-04-16 DIAGNOSIS — J449 Chronic obstructive pulmonary disease, unspecified: Secondary | ICD-10-CM | POA: Diagnosis not present

## 2021-04-16 DIAGNOSIS — J441 Chronic obstructive pulmonary disease with (acute) exacerbation: Secondary | ICD-10-CM | POA: Diagnosis not present

## 2021-04-17 ENCOUNTER — Other Ambulatory Visit: Payer: Self-pay | Admitting: Family Medicine

## 2021-04-18 NOTE — Telephone Encounter (Signed)
cpe is scheduled at 5/26 @1030 

## 2021-04-18 NOTE — Telephone Encounter (Signed)
Refill request for cardizem 120 mg capsules. Patient has not been seen since 02/07/20 and need annual wellness visit scheduled. Please call and schedule patient and send back for refill request to be completed.

## 2021-05-22 ENCOUNTER — Ambulatory Visit: Payer: Medicare HMO

## 2021-05-22 ENCOUNTER — Telehealth: Payer: Self-pay

## 2021-05-22 ENCOUNTER — Other Ambulatory Visit: Payer: Self-pay

## 2021-05-22 NOTE — Progress Notes (Deleted)
Subjective:   Dominique Clark is a 80 y.o. female who presents for Medicare Annual (Subsequent) preventive examination.  Review of Systems: N/A      I connected with the patient today by telephone and verified that I am speaking with the correct person using two identifiers. Location patient: home Location nurse: work Persons participating in the telephone visit: patient, nurse.   I discussed the limitations, risks, security and privacy concerns of performing an evaluation and management service by telephone and the availability of in person appointments. I also discussed with the patient that there may be a patient responsible charge related to this service. The patient expressed understanding and verbally consented to this telephonic visit.              Objective:    There were no vitals filed for this visit. There is no height or weight on file to calculate BMI.  Advanced Directives 06/21/2019 06/16/2018 05/13/2018 11/27/2017 10/09/2016 08/09/2012  Does Patient Have a Medical Advance Directive? No No Yes No No Patient does not have advance directive;Patient would not like information  Type of Advance Directive - - Living will - - -  Does patient want to make changes to medical advance directive? - - No - Patient declined - - -  Would patient like information on creating a medical advance directive? No - Patient declined No - Patient declined - Yes (ED - Information included in AVS) Yes - Educational materials given -  Pre-existing out of facility DNR order (yellow form or pink MOST form) - - - - - No    Current Medications (verified) Outpatient Encounter Medications as of 05/22/2021  Medication Sig  . diltiazem (CARDIZEM CD) 120 MG 24 hr capsule TAKE 1 CAPSULE AT BEDTIME (APPOINTMENT IS NEEDED FOR REFILLS)  . albuterol (VENTOLIN HFA) 108 (90 Base) MCG/ACT inhaler INHALE 1 TO 2 PUFFS EVERY 4 HOURS AS NEEDED FOR WHEEZING  . amoxicillin-clavulanate (AUGMENTIN) 875-125 MG tablet Take  1 tablet by mouth 2 (two) times daily. (Patient not taking: Reported on 05/16/2020)  . apixaban (ELIQUIS) 5 MG TABS tablet Take 1 tablet (5 mg total) by mouth 2 (two) times daily.  Marland Kitchen atorvastatin (LIPITOR) 20 MG tablet TAKE 1 TABLET EVERY DAY  . DOK 100 MG capsule TAKE 1 CAPSULE TWICE DAILY  . esomeprazole (NEXIUM) 20 MG capsule Take 20 mg by mouth daily at 12 noon.  Marland Kitchen FEROSUL 325 (65 Fe) MG tablet TAKE 1 TABLET (325 MG TOTAL) BY MOUTH 2 (TWO) TIMES DAILY WITH A MEAL.  . fluticasone (FLONASE) 50 MCG/ACT nasal spray Place 2 sprays into both nostrils daily as needed (seasonal allergies).  . Fluticasone-Salmeterol (WIXELA INHUB) 250-50 MCG/DOSE AEPB Inhale 1 puff into the lungs in the morning and at bedtime. Rinse after use.  . furosemide (LASIX) 20 MG tablet TAKE 1 TABLET (20 MG TOTAL) BY MOUTH DAILY AS NEEDED FOR FLUID (TAKE WITH POTASSIUM).  Marland Kitchen ipratropium-albuterol (DUONEB) 0.5-2.5 (3) MG/3ML SOLN INHALE THE CONTENTS OF 1 VIAL VIA NEBULIZER EVERY 6 HOURS AS NEEDED  . metoprolol succinate (TOPROL-XL) 50 MG 24 hr tablet Take 1 tablet (50 mg total) by mouth daily. Take with or immediately following a meal.  . NON FORMULARY Oxygen 3 liters 24/7  . potassium chloride (KLOR-CON) 10 MEQ tablet TAKE 1 TABLET EVERY DAY AS NEEDED (TAKE WITH LASIX).  Marland Kitchen predniSONE (DELTASONE) 20 MG tablet Take 2 a day for 5 days, then 1 a day for 5 days, with food. Don't take with aleve/ibuprofen. (  Patient not taking: Reported on 05/16/2020)  . traMADol (ULTRAM) 50 MG tablet Take 1-2 tablets (50-100 mg total) by mouth every 12 (twelve) hours as needed.  . traZODone (DESYREL) 50 MG tablet Take 0.5-1 tablets (25-50 mg total) by mouth at bedtime as needed for sleep.  . vancomycin (VANCOCIN) 125 MG capsule Take 1 capsule (125 mg total) by mouth 4 (four) times daily. (Patient not taking: Reported on 05/16/2020)   No facility-administered encounter medications on file as of 05/22/2021.    Allergies (verified) Doxycycline    History: Past Medical History:  Diagnosis Date  . AF (atrial fibrillation) (HCC)   . Asthma   . COPD (chronic obstructive pulmonary disease) (HCC)   . Diabetes mellitus    type 2  . Diverticulosis    s/p colonoscopy  . Gastritis    via EGD  . Hiatal hernia    s/p dilation 2004  . Osteopenia   . Stress incontinence, female   . TIA (transient ischemic attack)    Past Surgical History:  Procedure Laterality Date  . BLADDER REPAIR  9/04   bladder tack   . BREAST CYST EXCISION Right 1972  . CATARACT EXTRACTION    . TONSILLECTOMY     age 24  . TOTAL ABDOMINAL HYSTERECTOMY     Family History  Problem Relation Age of Onset  . Stroke Mother   . Breast cancer Neg Hx   . Colon cancer Neg Hx    Social History   Socioeconomic History  . Marital status: Single    Spouse name: Not on file  . Number of children: 2  . Years of education: Not on file  . Highest education level: Not on file  Occupational History  . 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 Smoking status: Former Smoker    Types: Cigarettes    Quit date: 12/29/1988    Years since quitting: 32.4  . Smokeless tobacco: Never Used  . Tobacco comment: 20 or more years   Substance and Sexual Activity  . Alcohol use: No    Alcohol/week: 0.0 standard drinks  . Drug use: No  . Sexual activity: Never  Other Topics Concern  . 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  {YES/NO:21197} Does the patient have any non-healing wounds?  {YES/NO:21197} Has the patient had any unintentional weight loss or weight gain?  {YES/NO:21197}  Diabetes:  Is the patient diabetic?  No  If diabetic, was a CBG obtained today?  N/A Did the patient bring in their glucometer from home?  N/A How often do you monitor your CBG's? N/A.   Financial Strains and Diabetes Management:  Are you having any financial strains with the device, your supplies or your  medication? N/A.  Does the patient want to be seen by Chronic Care Management for management of their diabetes?  N/A Would the patient like to be referred to a Nutritionist or for Diabetic Management?  N/A          Activities of Daily Living No flowsheet data found.  Patient Care Team: [name], MD as PCP - General (Family Medicine) Mariah Milling Tollie Pizza, MD as PCP - Cardiology (Cardiology) Irene Limbo., MD as Consulting Physician (Ophthalmology)  Indicate any recent Medical Services you may have received from other than [facility] providers in the past year (date may be approximate).     Assessment:   This is a routine wellness examination for [place].  Hearing/Vision screen No exam data present  Dietary issues and exercise activities discussed:    Goals Addressed   None    Depression Screen PHQ 2/9 Scores 06/21/2019 10/09/2016 10/04/2015  PHQ - 2 Score 0 0 0    Fall Risk Fall Risk  10/09/2016 10/04/2015  Falls in the past year? No No    FALL RISK PREVENTION PERTAINING TO THE HOME:  Any stairs in or around the home? Yes  If so, are there any without handrails? No  Home free of loose throw rugs in walkways, pet beds, electrical cords, etc? Yes  Adequate lighting in your home to reduce risk of falls? Yes   ASSISTIVE DEVICES UTILIZED TO PREVENT FALLS:  Life alert? {YES/NO:21197} Use of a cane, walker or w/c?  {YES/NO:21197} Grab bars in the bathroom? {YES/NO:21197} Shower chair or bench in shower? {YES/NO:21197} Elevated toilet seat or a handicapped toilet? {YES/NO:21197}  TIMED UP AND GO:  Was the test performed? N/A telephone visit .   Cognitive Function: MMSE - Mini Mental State Exam 10/09/2016  Orientation to time 5  Orientation to Place 5  Registration 3  Attention/ Calculation 0  Recall 3  Language- name 2 objects 0  Language- repeat 1  Language- follow 3 step command 3  Language- read & follow direction 0  Write a sentence 0  Copy design 0  Total score 20  Mini Cog  Mini-Cog screen was completed. Maximum score is 22. A value of 0 denotes this part of the MMSE was not completed or the patient failed this part of the Mini-Cog screening.       Immunizations Immunization History  Administered Date(s) Administered  . Influenza Whole 10/25/2013  . Influenza, High Dose Seasonal PF 12/06/2018, 11/23/2019  . Influenza,inj,Quad PF,6+ Mos 10/04/2015, 10/09/2016  . Influenza,inj,quad, With Preservative 10/22/2017  . Influenza-Unspecified 10/30/2014  . Pneumococcal Conjugate-13 10/04/2015  . Pneumococcal Polysaccharide-23 09/16/2007, 08/10/2012  . Td 08/21/1999    TDAP status: Due, Education has been provided regarding the importance of this vaccine. Advised may receive this vaccine at local pharmacy or Health Dept. Aware to provide a copy of the vaccination record if obtained from local pharmacy or Health Dept. Verbalized acceptance and understanding.  Flu Vaccine status: due Fall 2022  Pneumococcal vaccine status: Up to date  {Covid-19 vaccine status:2101808}  Qualifies for Shingles Vaccine? Yes   Zostavax completed No   Shingrix Completed?: No.    Education has been provided regarding the importance of this vaccine. Patient has been advised to call insurance company to determine out of pocket expense if they have not yet received this vaccine. Advised may also receive  vaccine at local pharmacy or Health Dept. Verbalized acceptance and understanding.  Screening Tests Health Maintenance  Topic Date Due  . COVID-19  Vaccine (1) Never done  . DEXA SCAN  Never done  . TETANUS/TDAP  12/29/2017  . INFLUENZA VACCINE  07/29/2021  . PNA vac Low Risk Adult  Completed  . HPV VACCINES  Aged Out    Health Maintenance  Health Maintenance Due  Topic Date Due  . COVID-19 Vaccine (1) Never done  . DEXA SCAN  Never done  . TETANUS/TDAP  12/29/2017    Colorectal cancer screening: No longer required.   Mammogram status: No longer required due to age.  {Bone Density status:21018021}  Lung Cancer Screening: (Low Dose CT Chest recommended if Age 69-80 years, 30 pack-year currently smoking OR have quit w/in 15years.) does not qualify.  Additional Screening:  Hepatitis C Screening: does not qualify; Completed N/A  Vision Screening: Recommended annual ophthalmology exams for early detection of glaucoma and other disorders of the eye. Is the patient up to date with their annual eye exam?  {YES/NO:21197} Who is the provider or what is the name of the office in which the patient attends annual eye exams? *** If pt is not established with a provider, would they like to be referred to a provider to establish care? No .   Dental Screening: Recommended annual dental exams for proper oral hygiene  Community Resource Referral / Chronic Care Management: CRR required this visit?  No   CCM required this visit?  No      Plan:     I have personally reviewed and noted the following in the patient's chart:   . Medical and social history . Use of alcohol, tobacco or illicit drugs  . Current medications and supplements including opioid prescriptions.  . Functional ability and status . Nutritional status . Physical activity . Advanced directives . List of other physicians . Hospitalizations, surgeries, and ER visits in previous 12 months . Vitals . Screenings to  include cognitive, depression, and falls . Referrals and appointments  In addition, I have reviewed and discussed with patient certain preventive protocols, quality metrics, and best practice recommendations. A written personalized care plan for preventive services as well as general preventive health recommendations were provided to patient.   Due to this being a telephonic visit, the after visit summary with patients personalized plan was offered to patient via office or my-chart. Patient preferred to pick up at office at next visit or via mychart.   Janalyn ShyJohnson, Kenai Fluegel, LPN   9/56/21305/25/2022

## 2021-05-22 NOTE — Telephone Encounter (Signed)
Called patient to complete her AWV. Patient had a hard time believing I was a true nurse from Dr. Lianne Bushy office because my number is not the same number as the office number for [facility]. Explained to her that I work from home and that's why the number is different. She stated that she received 20 spam calls yesterday alone and can't really trust anyone that calls and she doesn't recognize the number. Asked if this could be done at the office instead and I told her yes. Her provider can complete this if that is what she wants. Physical scheduled 05/23/21 @ 10:30 am. Appointment cancelled per patient request.

## 2021-05-23 ENCOUNTER — Encounter: Payer: Medicare HMO | Admitting: Family Medicine

## 2021-06-21 ENCOUNTER — Encounter: Payer: Medicare HMO | Admitting: Family Medicine

## 2021-06-24 ENCOUNTER — Other Ambulatory Visit: Payer: Self-pay | Admitting: Family Medicine

## 2021-06-25 ENCOUNTER — Other Ambulatory Visit: Payer: Self-pay

## 2021-06-25 ENCOUNTER — Ambulatory Visit (INDEPENDENT_AMBULATORY_CARE_PROVIDER_SITE_OTHER): Payer: Medicare HMO | Admitting: Family Medicine

## 2021-06-25 ENCOUNTER — Other Ambulatory Visit: Payer: Self-pay | Admitting: Family Medicine

## 2021-06-25 ENCOUNTER — Encounter: Payer: Self-pay | Admitting: Family Medicine

## 2021-06-25 VITALS — BP 112/80 | HR 77 | Temp 97.7°F | Ht 69.0 in | Wt 110.0 lb

## 2021-06-25 DIAGNOSIS — D649 Anemia, unspecified: Secondary | ICD-10-CM

## 2021-06-25 DIAGNOSIS — M545 Low back pain, unspecified: Secondary | ICD-10-CM | POA: Diagnosis not present

## 2021-06-25 DIAGNOSIS — J441 Chronic obstructive pulmonary disease with (acute) exacerbation: Secondary | ICD-10-CM | POA: Diagnosis not present

## 2021-06-25 DIAGNOSIS — M8580 Other specified disorders of bone density and structure, unspecified site: Secondary | ICD-10-CM

## 2021-06-25 DIAGNOSIS — I5033 Acute on chronic diastolic (congestive) heart failure: Secondary | ICD-10-CM

## 2021-06-25 DIAGNOSIS — I4891 Unspecified atrial fibrillation: Secondary | ICD-10-CM | POA: Diagnosis not present

## 2021-06-25 DIAGNOSIS — H6122 Impacted cerumen, left ear: Secondary | ICD-10-CM

## 2021-06-25 DIAGNOSIS — E785 Hyperlipidemia, unspecified: Secondary | ICD-10-CM

## 2021-06-25 DIAGNOSIS — G4700 Insomnia, unspecified: Secondary | ICD-10-CM | POA: Diagnosis not present

## 2021-06-25 DIAGNOSIS — M899 Disorder of bone, unspecified: Secondary | ICD-10-CM

## 2021-06-25 DIAGNOSIS — Z0000 Encounter for general adult medical examination without abnormal findings: Secondary | ICD-10-CM | POA: Diagnosis not present

## 2021-06-25 DIAGNOSIS — Z7189 Other specified counseling: Secondary | ICD-10-CM

## 2021-06-25 MED ORDER — TRAZODONE HCL 50 MG PO TABS: 25.0000 mg | ORAL_TABLET | Freq: Every evening | ORAL | 3 refills | Status: AC | PRN

## 2021-06-25 MED ORDER — AMOXICILLIN-POT CLAVULANATE 875-125 MG PO TABS: 1.0000 | ORAL_TABLET | Freq: Two times a day (BID) | ORAL | 0 refills | Status: DC

## 2021-06-25 MED ORDER — FLUTICASONE PROPIONATE 50 MCG/ACT NA SUSP: 2.0000 | Freq: Every day | NASAL | 3 refills | Status: DC | PRN

## 2021-06-25 MED ORDER — IPRATROPIUM-ALBUTEROL 0.5-2.5 (3) MG/3ML IN SOLN: RESPIRATORY_TRACT | 3 refills | Status: DC

## 2021-06-25 MED ORDER — FUROSEMIDE 20 MG PO TABS: 20.0000 mg | ORAL_TABLET | Freq: Every day | ORAL | 3 refills | Status: DC | PRN

## 2021-06-25 MED ORDER — POTASSIUM CHLORIDE ER 10 MEQ PO TBCR: EXTENDED_RELEASE_TABLET | ORAL | 3 refills | Status: DC

## 2021-06-25 MED ORDER — ESOMEPRAZOLE MAGNESIUM 20 MG PO CPDR: 20.0000 mg | DELAYED_RELEASE_CAPSULE | Freq: Every day | ORAL | 3 refills | Status: DC

## 2021-06-25 MED ORDER — APIXABAN 5 MG PO TABS: 5.0000 mg | ORAL_TABLET | Freq: Two times a day (BID) | ORAL | 3 refills | Status: DC

## 2021-06-25 MED ORDER — ATORVASTATIN CALCIUM 20 MG PO TABS: 20.0000 mg | ORAL_TABLET | Freq: Every day | ORAL | 3 refills | Status: DC

## 2021-06-25 MED ORDER — PREDNISONE 10 MG PO TABS: ORAL_TABLET | ORAL | 0 refills | Status: DC

## 2021-06-25 MED ORDER — METOPROLOL SUCCINATE ER 50 MG PO TB24: 50.0000 mg | ORAL_TABLET | Freq: Every day | ORAL | 3 refills | Status: DC

## 2021-06-25 MED ORDER — TRAMADOL HCL 50 MG PO TABS: 50.0000 mg | ORAL_TABLET | Freq: Two times a day (BID) | ORAL | 2 refills | Status: AC | PRN

## 2021-06-25 MED ORDER — ALBUTEROL SULFATE HFA 108 (90 BASE) MCG/ACT IN AERS: INHALATION_SPRAY | RESPIRATORY_TRACT | 5 refills | Status: DC

## 2021-06-25 MED ORDER — FLUTICASONE-SALMETEROL 250-50 MCG/ACT IN AEPB: 1.0000 | INHALATION_SPRAY | Freq: Two times a day (BID) | RESPIRATORY_TRACT | 3 refills | Status: DC

## 2021-06-25 MED ORDER — FERROUS SULFATE 325 (65 FE) MG PO TABS: ORAL_TABLET | ORAL | 3 refills | Status: DC

## 2021-06-25 MED ORDER — DILTIAZEM HCL ER COATED BEADS 120 MG PO CP24: ORAL_CAPSULE | ORAL | 3 refills | Status: DC

## 2021-06-25 NOTE — Progress Notes (Addendum)
This visit occurred during the SARS-CoV-2 public health emergency.  Safety protocols were in place, including screening questions prior to the visit, additional usage of staff PPE, and extensive cleaning of exam room while observing appropriate contact time as indicated for disinfecting solutions.  I have personally reviewed the Medicare Annual Wellness questionnaire and have noted 1. The patient's medical and social history 2. Their use of alcohol, tobacco or illicit drugs 3. Their current medications and supplements 4. The patient's functional ability including ADL's, fall risks, home safety risks and hearing or visual             impairment. 5. Diet and physical activities 6. Evidence for depression or mood disorders  The patients weight, height, BMI have been recorded in the chart and visual acuity is per eye clinic.  I have made referrals, counseling and provided education to the patient based review of the above and I have provided the pt with a written personalized care plan for preventive services.  Provider list updated- see scanned forms.  Routine anticipatory guidance given to patient.  See health maintenance. The possibility exists that previously documented standard health maintenance information may have been brought forward from a previous encounter into this note.  If needed, that same information has been updated to reflect the current situation based on today's encounter.    In addition to [facility], follow up visit for the below conditions:  Back pain.  Midline lower back pain, worse recently.  She had pain lifting about 2 year ago.  She has had pain intermittently in the meantime.  No acute injury.  She been taking tramadol at baseline, as needed for pain.  This is been less effective recently.  She did not feel well enough to go through x-rays today.  See exam and plan.  History of COPD O2 use at baseline.  Wheeze.  Sputum, yellow.  No fevers.  Compliant with baseline  inhalers.  AF.  Anticoagulated.  SOB at baseline, not worse recently.  No CP.  No bleeding.  Insomnia.  Had run out of trazodone but it helped prev.  Needed refill.  Done at office visit.  Son and daughter help out but she is still maintaining at home.    She is most bothered today by her back.    PMH and SH reviewed  Meds, vitals, and allergies reviewed.   ROS: Per HPI.  Unless specifically indicated otherwise in HPI, the patient denies:  General: fever. Eyes: acute vision changes ENT: sore throat Cardiovascular: chest pain Respiratory: SOB GI: vomiting GU: dysuria Musculoskeletal: acute back pain Derm: acute rash Neuro: acute motor dysfunction Psych: worsening mood Endocrine: polydipsia Heme: bleeding Allergy: hayfever  GEN: nad, alert and oriented HEENT: ncat, cerumen impaction in left ear resolved with irrigation. NECK: supple w/o LA CV: IRR PULM: rhonchi with scant exp wheeze.  No focal decrease in breath sounds. ABD: soft, +bs EXT: no edema SKIN: no acute rash but likely SCC on R hand. See AVS.  Encouraged derm referral.  Midline back ttp.  Bilateral paraspinal areas not tender.

## 2021-06-25 NOTE — Patient Instructions (Addendum)
When at home, if your pulse is above 100 then let me know.   Start prednisone and the antibiotics.  Let me know if the prednisone helps the back pain.  Restart trazodone for sleep.   Let me know when you want to go to the skin clinic.  I want them to check your right hand.   Take care.  Glad to see you.

## 2021-06-25 NOTE — Telephone Encounter (Signed)
All of the patients medications were refilled with patient was here in the office seeing Dr. Para March.

## 2021-06-25 NOTE — Telephone Encounter (Signed)
Pt requests other meds be refilled to [facility]    Cardizem Eliquis  I reviewed the patients list with her one by one and she stated the above are all she needs sent in with the Metoprolol  Please advise, thanks.

## 2021-06-26 ENCOUNTER — Other Ambulatory Visit: Payer: Self-pay | Admitting: Family Medicine

## 2021-06-26 LAB — COMPREHENSIVE METABOLIC PANEL
ALT: 12 U/L (ref 0–35)
AST: 24 U/L (ref 0–37)
Albumin: 3.9 g/dL (ref 3.5–5.2)
Alkaline Phosphatase: 81 U/L (ref 39–117)
BUN: 15 mg/dL (ref 6–23)
CO2: 35 mEq/L — ABNORMAL HIGH (ref 19–32)
Calcium: 9.9 mg/dL (ref 8.4–10.5)
Chloride: 98 mEq/L (ref 96–112)
Creatinine, Ser: 0.82 mg/dL (ref 0.40–1.20)
GFR: 67.33 mL/min (ref >60.00)
Glucose, Bld: 104 mg/dL — ABNORMAL HIGH (ref 70–99)
Potassium: 4.4 mEq/L (ref 3.5–5.1)
Sodium: 141 mEq/L (ref 135–145)
Total Bilirubin: 0.8 mg/dL (ref 0.2–1.2)
Total Protein: 6.6 g/dL (ref 6.0–8.3)

## 2021-06-26 LAB — IRON: Iron: 62 ug/dL (ref 42–145)

## 2021-06-26 LAB — LIPID PANEL
Cholesterol: 129 mg/dL (ref 0–200)
HDL: 63.3 mg/dL (ref >39.00)
LDL Cholesterol: 53 mg/dL (ref 0–99)
NonHDL: 65.85
Total CHOL/HDL Ratio: 2
Triglycerides: 63 mg/dL (ref 0.0–149.0)
VLDL: 12.6 mg/dL (ref 0.0–40.0)

## 2021-06-26 LAB — CBC WITH DIFFERENTIAL/PLATELET
Basophils Absolute: 0.1 10*3/uL (ref 0.0–0.1)
Basophils Relative: 1 % (ref 0.0–3.0)
Eosinophils Absolute: 0.1 10*3/uL (ref 0.0–0.7)
Eosinophils Relative: 1.1 % (ref 0.0–5.0)
HCT: 37.8 % (ref 36.0–46.0)
Hemoglobin: 12.1 g/dL (ref 12.0–15.0)
Lymphocytes Relative: 12.5 % (ref 12.0–46.0)
Lymphs Abs: 1.5 10*3/uL (ref 0.7–4.0)
MCHC: 32.1 g/dL (ref 30.0–36.0)
MCV: 94 fl (ref 78.0–100.0)
Monocytes Absolute: 1 10*3/uL (ref 0.1–1.0)
Monocytes Relative: 8.4 % (ref 3.0–12.0)
Neutro Abs: 9.5 10*3/uL — ABNORMAL HIGH (ref 1.4–7.7)
Neutrophils Relative %: 77 % (ref 43.0–77.0)
Platelets: 207 10*3/uL (ref 150.0–400.0)
RBC: 4.02 Mil/uL (ref 3.87–5.11)
RDW: 13.5 % (ref 11.5–15.5)
WBC: 12.4 10*3/uL — ABNORMAL HIGH (ref 4.0–10.5)

## 2021-06-26 LAB — TSH: TSH: 1.25 u[IU]/mL (ref 0.35–4.50)

## 2021-06-26 LAB — VITAMIN D 25 HYDROXY (VIT D DEFICIENCY, FRACTURES): VITD: 22.49 ng/mL — ABNORMAL LOW (ref 30.00–100.00)

## 2021-06-26 MED ORDER — VITAMIN D3 50 MCG (2000 UT) PO CAPS: 2000.0000 [IU] | ORAL_CAPSULE | Freq: Every day | ORAL | Status: AC

## 2021-06-26 NOTE — Assessment & Plan Note (Signed)
Continue metoprolol and diltiazem.  She is having trouble paying for Eliquis.  I will check on options for med assistance with that.  See notes on labs.

## 2021-06-26 NOTE — Assessment & Plan Note (Signed)
Continue tramadol.  She is going to start prednisone in the meantime anyway because of her respiratory symptoms.  I want her to update me if that makes any difference in her back pain.  She did not feel up to imaging today with plain films.  No weakness in the lower extremities and still okay for outpatient follow-up.

## 2021-06-26 NOTE — Assessment & Plan Note (Signed)
Continue baseline inhalers but add on prednisone taper with routine steroid cautions and start Augmentin.  All her to update me as needed, especially if she is not improving.

## 2021-06-26 NOTE — Assessment & Plan Note (Signed)
Resolved with irrigation.  Bilateral TM recheck normal after that.  She felt better.

## 2021-06-26 NOTE — Assessment & Plan Note (Signed)
Restart trazodone.  She will update me if is not helping.

## 2021-06-26 NOTE — Assessment & Plan Note (Signed)
Flu up-to-date Shingles discussed with patient PNA up-to-date Tetanus 2000, discussed with patient COVID-vaccine previously Done colon cancer screening not due given her age. Breast cancer screening declined by patient.  Discussed Bone density test declined by patient.  Discussed. Advance directive-both of her children equally designated if patient were incapacitated. Cognitive function addressed- see scanned forms- and if abnormal then additional documentation follows.

## 2021-06-26 NOTE — Assessment & Plan Note (Signed)
Advance directive-both of her children equally designated if patient were incapacitated.

## 2021-07-03 ENCOUNTER — Other Ambulatory Visit: Payer: Self-pay | Admitting: Family Medicine

## 2021-07-03 ENCOUNTER — Telehealth: Payer: Self-pay

## 2021-07-03 DIAGNOSIS — J441 Chronic obstructive pulmonary disease with (acute) exacerbation: Secondary | ICD-10-CM

## 2021-07-03 DIAGNOSIS — I5033 Acute on chronic diastolic (congestive) heart failure: Secondary | ICD-10-CM

## 2021-07-03 MED ORDER — FLUTICASONE-SALMETEROL 250-50 MCG/ACT IN AEPB: 1.0000 | INHALATION_SPRAY | Freq: Two times a day (BID) | RESPIRATORY_TRACT | 3 refills | Status: AC

## 2021-07-03 MED ORDER — POTASSIUM CHLORIDE ER 10 MEQ PO TBCR: EXTENDED_RELEASE_TABLET | ORAL | 3 refills | Status: AC

## 2021-07-03 MED ORDER — ALBUTEROL SULFATE HFA 108 (90 BASE) MCG/ACT IN AERS: INHALATION_SPRAY | RESPIRATORY_TRACT | 5 refills | Status: AC

## 2021-07-03 MED ORDER — FLUTICASONE PROPIONATE 50 MCG/ACT NA SUSP: 2.0000 | Freq: Every day | NASAL | 3 refills | Status: AC | PRN

## 2021-07-03 MED ORDER — IPRATROPIUM-ALBUTEROL 0.5-2.5 (3) MG/3ML IN SOLN: RESPIRATORY_TRACT | 3 refills | Status: AC

## 2021-07-03 MED ORDER — ESOMEPRAZOLE MAGNESIUM 20 MG PO CPDR: 20.0000 mg | DELAYED_RELEASE_CAPSULE | Freq: Every day | ORAL | 3 refills | Status: AC

## 2021-07-03 MED ORDER — DILTIAZEM HCL ER COATED BEADS 120 MG PO CP24: ORAL_CAPSULE | ORAL | 3 refills | Status: AC

## 2021-07-03 MED ORDER — METOPROLOL SUCCINATE ER 50 MG PO TB24: 50.0000 mg | ORAL_TABLET | Freq: Every day | ORAL | 3 refills | Status: AC

## 2021-07-03 MED ORDER — ATORVASTATIN CALCIUM 20 MG PO TABS: 20.0000 mg | ORAL_TABLET | Freq: Every day | ORAL | 3 refills | Status: AC

## 2021-07-03 MED ORDER — FUROSEMIDE 20 MG PO TABS: 20.0000 mg | ORAL_TABLET | Freq: Every day | ORAL | 3 refills | Status: AC | PRN

## 2021-07-03 MED ORDER — FERROUS SULFATE 325 (65 FE) MG PO TABS: ORAL_TABLET | ORAL | 3 refills | Status: AC

## 2021-07-03 MED ORDER — AMOXICILLIN-POT CLAVULANATE 875-125 MG PO TABS: 1.0000 | ORAL_TABLET | Freq: Two times a day (BID) | ORAL | 0 refills | Status: AC

## 2021-07-03 MED ORDER — APIXABAN 5 MG PO TABS: 5.0000 mg | ORAL_TABLET | Freq: Two times a day (BID) | ORAL | 3 refills | Status: AC

## 2021-07-03 NOTE — Addendum Note (Signed)
Addended by: Wendie Simmer B on: 07/03/2021 02:44 PM   Modules accepted: Orders

## 2021-07-03 NOTE — Telephone Encounter (Signed)
Pt called and said all the meds refilled on 06/25/21 need to be sent to [facility] pharmacy; pt said she did not want sent to [facility] pharmacy; I asked pt names of meds and she said her A/C was off and pt is on oxygen and having trouble talking due to A/C off and I offered to call 911 which pt declined and also offered to call family member about A/C being off and she said her family already knew. Pt request cb when meds sent to [facility] that was filled on 06/25/21 to [facility] pharmacy.

## 2021-07-03 NOTE — Telephone Encounter (Signed)
I spoke with patient this morning right after this call was taken. Patients air is out but her son is working on getting this fixed for her or a window unit put in. I did resend all of her medications to [facility] pharmacy and she is aware this was being done. I spoke with patient about her results and continued respiratory sx. See result note.

## 2021-07-05 ENCOUNTER — Telehealth: Payer: Self-pay | Admitting: Family Medicine

## 2021-07-05 NOTE — Chronic Care Management (AMB) (Signed)
  Chronic Care Management   Outreach Note  07/05/2021 Name: Dominique Clark MRN: [id number] DOB: Feb 18, 1941  Referred by: [name], MD Reason for referral : No chief complaint on file.   An unsuccessful telephone outreach was attempted today. The patient was referred to the pharmacist for assistance with care management and care coordination.   Follow Up Plan:   Tatjana Dellinger Upstream Scheduler

## 2021-07-11 ENCOUNTER — Other Ambulatory Visit: Payer: Self-pay

## 2021-07-11 MED ORDER — DOCUSATE SODIUM 100 MG PO CAPS: 100.0000 mg | ORAL_CAPSULE | Freq: Two times a day (BID) | ORAL | 1 refills | Status: AC

## 2021-07-11 NOTE — Telephone Encounter (Signed)
Name of Medication: Docusate 100mg   Name of Pharmacy: [facility] Last Fill or Written Date and Quantity: 02/08/20 180, 3  Last Office Visit and Type: 06/25/21 Annual Exam   Okay for refill X 6 months, patient is compliant with care management.

## 2021-07-15 ENCOUNTER — Telehealth: Payer: Self-pay | Admitting: *Deleted

## 2021-07-15 MED ORDER — PREDNISONE 10 MG PO TABS: ORAL_TABLET | ORAL | 0 refills | Status: AC

## 2021-07-15 NOTE — Telephone Encounter (Signed)
I sent the rx for prednisone if not better with that or if worse in the meantime, then please let me know.  Thanks.

## 2021-07-15 NOTE — Telephone Encounter (Signed)
Patient called stating that she was given Prednisone for her back and wheezing. Patient stated that she finished the Prednisone about a week ago.  Patient stated that her back is much better but she still is having some wheezing. Patient stated that the wheezing is some better but she feels that she needs another round of the Prednisone. Patient stated that it usually takes two rounds of Prednisone to clear this up completely. Patient denies anymore SOB than she usually has. Patient denies a fever. Patient was given ER precautions and she verbalized understanding. Pharmacy [facility]

## 2021-07-16 NOTE — Telephone Encounter (Signed)
Patient notified rx was sent. Advised to call back if prednisone does not help or sx get worse.

## 2021-07-18 ENCOUNTER — Telehealth: Payer: Self-pay | Admitting: Family Medicine

## 2021-07-18 NOTE — Chronic Care Management (AMB) (Signed)
  Chronic Care Management   Note  07/18/2021 Name: Dominique Clark MRN: [id number] DOB: Jan 05, 1941  Dominique Clark is a 80 y.o. year old female who is a primary care patient of [name], MD. I reached out to Stark Klein I Rodrigues by phone today in response to a referral sent by [name]'s PCP, [name], MD.   [name] including:  CCM service includes personalized support from designated clinical staff supervised by her physician, including individualized plan of care and coordination with other care providers 24/7 contact phone numbers for assistance for urgent and routine care needs. Service will only be billed when office clinical staff spend 20 minutes or more in a month to coordinate care. Only one practitioner may furnish and bill the service in a calendar month. The patient may stop CCM services at any time (effective at the end of the month) by phone call to the office staff.   Patient agreed to services and verbal consent obtained.   Follow up plan:   Tatjana Restaurant manager, fast food

## 2021-07-29 ENCOUNTER — Telehealth: Payer: Self-pay

## 2021-07-29 NOTE — Progress Notes (Signed)
    Chronic Care Management Pharmacy Assistant   Name: Dominique Clark  MRN: [id number] DOB: 1941-01-06  Reason for Encounter: Patient Assistance (Eliquis)   Medications: Outpatient Encounter Medications as of 07/29/2021  Medication Sig   albuterol (VENTOLIN HFA) 108 (90 Base) MCG/ACT inhaler INHALE 1 TO 2 PUFFS EVERY 4 HOURS AS NEEDED FOR WHEEZING   amoxicillin-clavulanate (AUGMENTIN) 875-125 MG tablet Take 1 tablet by mouth 2 (two) times daily.   apixaban (ELIQUIS) 5 MG TABS tablet Take 1 tablet (5 mg total) by mouth 2 (two) times daily.   atorvastatin (LIPITOR) 20 MG tablet Take 1 tablet (20 mg total) by mouth daily.   Cholecalciferol (VITAMIN D3) 50 MCG (2000 UT) capsule Take 1 capsule (2,000 Units total) by mouth daily.   diltiazem (CARDIZEM CD) 120 MG 24 hr capsule TAKE 1 CAPSULE AT BEDTIME (APPOINTMENT IS NEEDED FOR REFILLS)   docusate sodium (DOK) 100 MG capsule Take 1 capsule (100 mg total) by mouth 2 (two) times daily.   esomeprazole (NEXIUM) 20 MG capsule Take 1 capsule (20 mg total) by mouth daily at 12 noon.   ferrous sulfate (FEROSUL) 325 (65 FE) MG tablet TAKE 1 TABLET (325 MG TOTAL) BY MOUTH 2 (TWO) TIMES DAILY WITH A MEAL.   fluticasone (FLONASE) 50 MCG/ACT nasal spray Place 2 sprays into both nostrils daily as needed (seasonal allergies).   fluticasone-salmeterol (WIXELA INHUB) 250-50 MCG/ACT AEPB Inhale 1 puff into the lungs in the morning and at bedtime.   furosemide (LASIX) 20 MG tablet Take 1 tablet (20 mg total) by mouth daily as needed for fluid (take with potassium).   ipratropium-albuterol (DUONEB) 0.5-2.5 (3) MG/3ML SOLN Use up to 4 times a day if needed.   metoprolol succinate (TOPROL-XL) 50 MG 24 hr tablet Take 1 tablet (50 mg total) by mouth daily. Take with or immediately following a meal.   NON FORMULARY Oxygen 3 liters 24/7   potassium chloride (KLOR-CON) 10 MEQ tablet TAKE 1 TABLET EVERY DAY AS NEEDED (TAKE WITH LASIX).   predniSONE (DELTASONE) 10 MG tablet  Take 2 a day for 5 days, then 1 a day for 5 days, with food. Don't take with aleve/ibuprofen.   traMADol (ULTRAM) 50 MG tablet Take 1-2 tablets (50-100 mg total) by mouth every 12 (twelve) hours as needed.   traZODone (DESYREL) 50 MG tablet Take 0.5-1 tablets (25-50 mg total) by mouth at bedtime as needed for sleep.   No facility-administered encounter medications on file as of 07/29/2021.   Patient assistance application for Eliquis has been completed on behalf of the patient. Patient contacted to verify if she would like the application mailed to her or if she prefers to sign in the office. Patient prefers to have application mailed to her. Application sent to front desk at [facility] to mail to patient. Patient aware she will need to provide requested income verification to be sent the manufacturer.    Phil Dopp, CPP notified  Jomarie Longs, [facility] Clinical Pharmacy Assistant [phone]

## 2021-08-08 ENCOUNTER — Inpatient Hospital Stay (HOSPITAL_COMMUNITY): Payer: Medicare HMO

## 2021-08-08 ENCOUNTER — Inpatient Hospital Stay (HOSPITAL_COMMUNITY)
Admission: EM | Admit: 2021-08-08 | Discharge: 2021-08-29 | DRG: 871 | Disposition: E | Payer: Medicare HMO | Attending: Internal Medicine | Admitting: Internal Medicine

## 2021-08-08 ENCOUNTER — Other Ambulatory Visit: Payer: Self-pay

## 2021-08-08 ENCOUNTER — Emergency Department (HOSPITAL_COMMUNITY): Payer: Medicare HMO

## 2021-08-08 ENCOUNTER — Encounter (HOSPITAL_COMMUNITY): Payer: Self-pay

## 2021-08-08 ENCOUNTER — Telehealth: Payer: Self-pay | Admitting: Family Medicine

## 2021-08-08 DIAGNOSIS — R17 Unspecified jaundice: Secondary | ICD-10-CM | POA: Diagnosis present

## 2021-08-08 DIAGNOSIS — Z8673 Personal history of transient ischemic attack (TIA), and cerebral infarction without residual deficits: Secondary | ICD-10-CM | POA: Diagnosis not present

## 2021-08-08 DIAGNOSIS — E43 Unspecified severe protein-calorie malnutrition: Secondary | ICD-10-CM | POA: Diagnosis present

## 2021-08-08 DIAGNOSIS — Z4682 Encounter for fitting and adjustment of non-vascular catheter: Secondary | ICD-10-CM | POA: Diagnosis not present

## 2021-08-08 DIAGNOSIS — Z9911 Dependence on respirator [ventilator] status: Secondary | ICD-10-CM

## 2021-08-08 DIAGNOSIS — I4891 Unspecified atrial fibrillation: Secondary | ICD-10-CM | POA: Diagnosis not present

## 2021-08-08 DIAGNOSIS — Z20822 Contact with and (suspected) exposure to covid-19: Secondary | ICD-10-CM | POA: Diagnosis not present

## 2021-08-08 DIAGNOSIS — I9589 Other hypotension: Secondary | ICD-10-CM | POA: Diagnosis not present

## 2021-08-08 DIAGNOSIS — R6520 Severe sepsis without septic shock: Secondary | ICD-10-CM | POA: Diagnosis not present

## 2021-08-08 DIAGNOSIS — R6521 Severe sepsis with septic shock: Secondary | ICD-10-CM | POA: Diagnosis not present

## 2021-08-08 DIAGNOSIS — Z87891 Personal history of nicotine dependence: Secondary | ICD-10-CM

## 2021-08-08 DIAGNOSIS — Z515 Encounter for palliative care: Secondary | ICD-10-CM

## 2021-08-08 DIAGNOSIS — K449 Diaphragmatic hernia without obstruction or gangrene: Secondary | ICD-10-CM | POA: Diagnosis not present

## 2021-08-08 DIAGNOSIS — J449 Chronic obstructive pulmonary disease, unspecified: Secondary | ICD-10-CM | POA: Diagnosis not present

## 2021-08-08 DIAGNOSIS — I499 Cardiac arrhythmia, unspecified: Secondary | ICD-10-CM | POA: Diagnosis not present

## 2021-08-08 DIAGNOSIS — R64 Cachexia: Secondary | ICD-10-CM | POA: Diagnosis present

## 2021-08-08 DIAGNOSIS — I700 Atherosclerosis of aorta: Secondary | ICD-10-CM | POA: Diagnosis not present

## 2021-08-08 DIAGNOSIS — R54 Age-related physical debility: Secondary | ICD-10-CM | POA: Diagnosis present

## 2021-08-08 DIAGNOSIS — I5032 Chronic diastolic (congestive) heart failure: Secondary | ICD-10-CM | POA: Diagnosis present

## 2021-08-08 DIAGNOSIS — Z823 Family history of stroke: Secondary | ICD-10-CM

## 2021-08-08 DIAGNOSIS — Z66 Do not resuscitate: Secondary | ICD-10-CM | POA: Diagnosis not present

## 2021-08-08 DIAGNOSIS — J90 Pleural effusion, not elsewhere classified: Secondary | ICD-10-CM | POA: Diagnosis not present

## 2021-08-08 DIAGNOSIS — Z681 Body mass index (BMI) 19 or less, adult: Secondary | ICD-10-CM

## 2021-08-08 DIAGNOSIS — E872 Acidosis: Secondary | ICD-10-CM | POA: Diagnosis present

## 2021-08-08 DIAGNOSIS — J9690 Respiratory failure, unspecified, unspecified whether with hypoxia or hypercapnia: Secondary | ICD-10-CM | POA: Diagnosis not present

## 2021-08-08 DIAGNOSIS — I480 Paroxysmal atrial fibrillation: Secondary | ICD-10-CM | POA: Diagnosis present

## 2021-08-08 DIAGNOSIS — J9621 Acute and chronic respiratory failure with hypoxia: Secondary | ICD-10-CM | POA: Diagnosis present

## 2021-08-08 DIAGNOSIS — E162 Hypoglycemia, unspecified: Secondary | ICD-10-CM

## 2021-08-08 DIAGNOSIS — Z978 Presence of other specified devices: Secondary | ICD-10-CM

## 2021-08-08 DIAGNOSIS — R6511 Systemic inflammatory response syndrome (SIRS) of non-infectious origin with acute organ dysfunction: Secondary | ICD-10-CM | POA: Diagnosis not present

## 2021-08-08 DIAGNOSIS — N170 Acute kidney failure with tubular necrosis: Secondary | ICD-10-CM | POA: Diagnosis present

## 2021-08-08 DIAGNOSIS — E11649 Type 2 diabetes mellitus with hypoglycemia without coma: Secondary | ICD-10-CM | POA: Diagnosis present

## 2021-08-08 DIAGNOSIS — E875 Hyperkalemia: Secondary | ICD-10-CM | POA: Diagnosis present

## 2021-08-08 DIAGNOSIS — Z79899 Other long term (current) drug therapy: Secondary | ICD-10-CM

## 2021-08-08 DIAGNOSIS — E161 Other hypoglycemia: Secondary | ICD-10-CM | POA: Diagnosis not present

## 2021-08-08 DIAGNOSIS — Z7901 Long term (current) use of anticoagulants: Secondary | ICD-10-CM

## 2021-08-08 DIAGNOSIS — J9811 Atelectasis: Secondary | ICD-10-CM | POA: Diagnosis not present

## 2021-08-08 DIAGNOSIS — J189 Pneumonia, unspecified organism: Secondary | ICD-10-CM | POA: Diagnosis present

## 2021-08-08 DIAGNOSIS — A403 Sepsis due to Streptococcus pneumoniae: Secondary | ICD-10-CM | POA: Diagnosis not present

## 2021-08-08 DIAGNOSIS — L8990 Pressure ulcer of unspecified site, unspecified stage: Secondary | ICD-10-CM | POA: Insufficient documentation

## 2021-08-08 DIAGNOSIS — J441 Chronic obstructive pulmonary disease with (acute) exacerbation: Secondary | ICD-10-CM | POA: Diagnosis present

## 2021-08-08 DIAGNOSIS — L89151 Pressure ulcer of sacral region, stage 1: Secondary | ICD-10-CM | POA: Diagnosis present

## 2021-08-08 DIAGNOSIS — K5790 Diverticulosis of intestine, part unspecified, without perforation or abscess without bleeding: Secondary | ICD-10-CM | POA: Diagnosis present

## 2021-08-08 DIAGNOSIS — A419 Sepsis, unspecified organism: Secondary | ICD-10-CM

## 2021-08-08 DIAGNOSIS — J80 Acute respiratory distress syndrome: Secondary | ICD-10-CM | POA: Diagnosis not present

## 2021-08-08 DIAGNOSIS — J13 Pneumonia due to Streptococcus pneumoniae: Secondary | ICD-10-CM | POA: Diagnosis present

## 2021-08-08 DIAGNOSIS — I110 Hypertensive heart disease with heart failure: Secondary | ICD-10-CM | POA: Diagnosis present

## 2021-08-08 DIAGNOSIS — Z9981 Dependence on supplemental oxygen: Secondary | ICD-10-CM | POA: Diagnosis not present

## 2021-08-08 DIAGNOSIS — N179 Acute kidney failure, unspecified: Secondary | ICD-10-CM | POA: Diagnosis not present

## 2021-08-08 DIAGNOSIS — A491 Streptococcal infection, unspecified site: Secondary | ICD-10-CM | POA: Diagnosis not present

## 2021-08-08 DIAGNOSIS — J440 Chronic obstructive pulmonary disease with acute lower respiratory infection: Secondary | ICD-10-CM | POA: Diagnosis present

## 2021-08-08 DIAGNOSIS — R0602 Shortness of breath: Secondary | ICD-10-CM | POA: Diagnosis not present

## 2021-08-08 DIAGNOSIS — Z7189 Other specified counseling: Secondary | ICD-10-CM | POA: Diagnosis not present

## 2021-08-08 DIAGNOSIS — R791 Abnormal coagulation profile: Secondary | ICD-10-CM | POA: Diagnosis present

## 2021-08-08 DIAGNOSIS — Z4659 Encounter for fitting and adjustment of other gastrointestinal appliance and device: Secondary | ICD-10-CM

## 2021-08-08 DIAGNOSIS — E860 Dehydration: Secondary | ICD-10-CM | POA: Diagnosis not present

## 2021-08-08 DIAGNOSIS — R6510 Systemic inflammatory response syndrome (SIRS) of non-infectious origin without acute organ dysfunction: Secondary | ICD-10-CM

## 2021-08-08 DIAGNOSIS — R7401 Elevation of levels of liver transaminase levels: Secondary | ICD-10-CM | POA: Diagnosis present

## 2021-08-08 DIAGNOSIS — Z881 Allergy status to other antibiotic agents status: Secondary | ICD-10-CM

## 2021-08-08 DIAGNOSIS — J9622 Acute and chronic respiratory failure with hypercapnia: Secondary | ICD-10-CM | POA: Diagnosis present

## 2021-08-08 DIAGNOSIS — M8580 Other specified disorders of bone density and structure, unspecified site: Secondary | ICD-10-CM | POA: Diagnosis present

## 2021-08-08 DIAGNOSIS — R627 Adult failure to thrive: Secondary | ICD-10-CM | POA: Diagnosis present

## 2021-08-08 LAB — I-STAT CHEM 8, ED
BUN: 51 mg/dL — ABNORMAL HIGH (ref 8–23)
Calcium, Ion: 1.01 mmol/L — ABNORMAL LOW (ref 1.15–1.40)
Chloride: 98 mmol/L (ref 98–111)
Creatinine, Ser: 2.1 mg/dL — ABNORMAL HIGH (ref 0.44–1.00)
Glucose, Bld: 54 mg/dL — ABNORMAL LOW (ref 70–99)
HCT: 39 % (ref 36.0–46.0)
Hemoglobin: 13.3 g/dL (ref 12.0–15.0)
Potassium: 5.3 mmol/L — ABNORMAL HIGH (ref 3.5–5.1)
Sodium: 138 mmol/L (ref 135–145)
TCO2: 36 mmol/L — ABNORMAL HIGH (ref 22–32)

## 2021-08-08 LAB — CBC WITH DIFFERENTIAL/PLATELET
Abs Immature Granulocytes: 1.87 10*3/uL — ABNORMAL HIGH (ref 0.00–0.07)
Basophils Absolute: 0.2 10*3/uL — ABNORMAL HIGH (ref 0.0–0.1)
Basophils Relative: 1 %
Eosinophils Absolute: 0 10*3/uL (ref 0.0–0.5)
Eosinophils Relative: 0 %
HCT: 40.7 % (ref 36.0–46.0)
Hemoglobin: 12.1 g/dL (ref 12.0–15.0)
Immature Granulocytes: 8 %
Lymphocytes Relative: 1 %
Lymphs Abs: 0.3 10*3/uL — ABNORMAL LOW (ref 0.7–4.0)
MCH: 30.7 pg (ref 26.0–34.0)
MCHC: 29.7 g/dL — ABNORMAL LOW (ref 30.0–36.0)
MCV: 103.3 fL — ABNORMAL HIGH (ref 80.0–100.0)
Monocytes Absolute: 0.4 10*3/uL (ref 0.1–1.0)
Monocytes Relative: 2 %
Neutro Abs: 21.5 10*3/uL — ABNORMAL HIGH (ref 1.7–7.7)
Neutrophils Relative %: 88 %
Platelets: 211 10*3/uL (ref 150–400)
RBC: 3.94 MIL/uL (ref 3.87–5.11)
RDW: 13.3 % (ref 11.5–15.5)
WBC: 24.3 10*3/uL — ABNORMAL HIGH (ref 4.0–10.5)
nRBC: 0 % (ref 0.0–0.2)

## 2021-08-08 LAB — COMPREHENSIVE METABOLIC PANEL
ALT: 39 U/L (ref 0–44)
AST: 85 U/L — ABNORMAL HIGH (ref 15–41)
Albumin: 2.7 g/dL — ABNORMAL LOW (ref 3.5–5.0)
Alkaline Phosphatase: 68 U/L (ref 38–126)
Anion gap: 10 (ref 5–15)
BUN: 49 mg/dL — ABNORMAL HIGH (ref 8–23)
CO2: 33 mmol/L — ABNORMAL HIGH (ref 22–32)
Calcium: 8.6 mg/dL — ABNORMAL LOW (ref 8.9–10.3)
Chloride: 95 mmol/L — ABNORMAL LOW (ref 98–111)
Creatinine, Ser: 2.06 mg/dL — ABNORMAL HIGH (ref 0.44–1.00)
GFR, Estimated: 24 mL/min — ABNORMAL LOW (ref >60)
Glucose, Bld: 57 mg/dL — ABNORMAL LOW (ref 70–99)
Potassium: 5.6 mmol/L — ABNORMAL HIGH (ref 3.5–5.1)
Sodium: 138 mmol/L (ref 135–145)
Total Bilirubin: 1.3 mg/dL — ABNORMAL HIGH (ref 0.3–1.2)
Total Protein: 6.3 g/dL — ABNORMAL LOW (ref 6.5–8.1)

## 2021-08-08 LAB — I-STAT ARTERIAL BLOOD GAS, ED
Acid-Base Excess: 4 mmol/L — ABNORMAL HIGH (ref 0.0–2.0)
Acid-Base Excess: 2 mmol/L (ref 0.0–2.0)
Acid-Base Excess: 5 mmol/L — ABNORMAL HIGH (ref 0.0–2.0)
Bicarbonate: 35 mmol/L — ABNORMAL HIGH (ref 20.0–28.0)
Bicarbonate: 33.9 mmol/L — ABNORMAL HIGH (ref 20.0–28.0)
Bicarbonate: 32.7 mmol/L — ABNORMAL HIGH (ref 20.0–28.0)
Calcium, Ion: 1.12 mmol/L — ABNORMAL LOW (ref 1.15–1.40)
Calcium, Ion: 1.15 mmol/L (ref 1.15–1.40)
Calcium, Ion: 1.11 mmol/L — ABNORMAL LOW (ref 1.15–1.40)
HCT: 35 % — ABNORMAL LOW (ref 36.0–46.0)
HCT: 35 % — ABNORMAL LOW (ref 36.0–46.0)
HCT: 31 % — ABNORMAL LOW (ref 36.0–46.0)
Hemoglobin: 11.9 g/dL — ABNORMAL LOW (ref 12.0–15.0)
Hemoglobin: 11.9 g/dL — ABNORMAL LOW (ref 12.0–15.0)
Hemoglobin: 10.5 g/dL — ABNORMAL LOW (ref 12.0–15.0)
O2 Saturation: 99 %
O2 Saturation: 97 %
O2 Saturation: 100 %
Patient temperature: 98.2
Patient temperature: 98.2
Patient temperature: 98.2
Potassium: 5.2 mmol/L — ABNORMAL HIGH (ref 3.5–5.1)
Potassium: 5.3 mmol/L — ABNORMAL HIGH (ref 3.5–5.1)
Potassium: 4.4 mmol/L (ref 3.5–5.1)
Sodium: 137 mmol/L (ref 135–145)
Sodium: 138 mmol/L (ref 135–145)
Sodium: 138 mmol/L (ref 135–145)
TCO2: 38 mmol/L — ABNORMAL HIGH (ref 22–32)
TCO2: 37 mmol/L — ABNORMAL HIGH (ref 22–32)
TCO2: 35 mmol/L — ABNORMAL HIGH (ref 22–32)
pCO2 arterial: 87.9 mmHg (ref 32.0–48.0)
pCO2 arterial: 101.7 mmHg (ref 32.0–48.0)
pCO2 arterial: 63.6 mmHg — ABNORMAL HIGH (ref 32.0–48.0)
pH, Arterial: 7.206 — ABNORMAL LOW (ref 7.350–7.450)
pH, Arterial: 7.129 — CL (ref 7.350–7.450)
pH, Arterial: 7.318 — ABNORMAL LOW (ref 7.350–7.450)
pO2, Arterial: 165 mmHg — ABNORMAL HIGH (ref 83.0–108.0)
pO2, Arterial: 132 mmHg — ABNORMAL HIGH (ref 83.0–108.0)
pO2, Arterial: 412 mmHg — ABNORMAL HIGH (ref 83.0–108.0)

## 2021-08-08 LAB — URINALYSIS, ROUTINE W REFLEX MICROSCOPIC
Bilirubin Urine: NEGATIVE
Glucose, UA: NEGATIVE mg/dL
Hgb urine dipstick: NEGATIVE
Ketones, ur: NEGATIVE mg/dL
Nitrite: NEGATIVE
Protein, ur: 100 mg/dL — AB
Specific Gravity, Urine: 1.019 (ref 1.005–1.030)
pH: 5 (ref 5.0–8.0)

## 2021-08-08 LAB — PHOSPHORUS: Phosphorus: 4.8 mg/dL — ABNORMAL HIGH (ref 2.5–4.6)

## 2021-08-08 LAB — BASIC METABOLIC PANEL
Anion gap: 9 (ref 5–15)
BUN: 49 mg/dL — ABNORMAL HIGH (ref 8–23)
CO2: 30 mmol/L (ref 22–32)
Calcium: 8 mg/dL — ABNORMAL LOW (ref 8.9–10.3)
Chloride: 98 mmol/L (ref 98–111)
Creatinine, Ser: 1.98 mg/dL — ABNORMAL HIGH (ref 0.44–1.00)
GFR, Estimated: 25 mL/min — ABNORMAL LOW (ref >60)
Glucose, Bld: 159 mg/dL — ABNORMAL HIGH (ref 70–99)
Potassium: 4.6 mmol/L (ref 3.5–5.1)
Sodium: 137 mmol/L (ref 135–145)

## 2021-08-08 LAB — TROPONIN I (HIGH SENSITIVITY)
Troponin I (High Sensitivity): 46 ng/L — ABNORMAL HIGH
Troponin I (High Sensitivity): 48 ng/L — ABNORMAL HIGH (ref <18)

## 2021-08-08 LAB — HEMOGLOBIN A1C
Hgb A1c MFr Bld: 5.9 % — ABNORMAL HIGH (ref 4.8–5.6)
Mean Plasma Glucose: 122.63 mg/dL

## 2021-08-08 LAB — PROCALCITONIN: Procalcitonin: 13.68 ng/mL

## 2021-08-08 LAB — GLUCOSE, CAPILLARY
Glucose-Capillary: 124 mg/dL — ABNORMAL HIGH (ref 70–99)
Glucose-Capillary: 169 mg/dL — ABNORMAL HIGH (ref 70–99)
Glucose-Capillary: 133 mg/dL — ABNORMAL HIGH (ref 70–99)

## 2021-08-08 LAB — RESP PANEL BY RT-PCR (FLU A&B, COVID) ARPGX2
Influenza A by PCR: NEGATIVE
Influenza B by PCR: NEGATIVE
SARS Coronavirus 2 by RT PCR: NEGATIVE

## 2021-08-08 LAB — PROTIME-INR
INR: 2.6 — ABNORMAL HIGH (ref 0.8–1.2)
Prothrombin Time: 27.9 seconds — ABNORMAL HIGH (ref 11.4–15.2)

## 2021-08-08 LAB — LACTIC ACID, PLASMA
Lactic Acid, Venous: 1.6 mmol/L (ref 0.5–1.9)
Lactic Acid, Venous: 2.2 mmol/L (ref 0.5–1.9)
Lactic Acid, Venous: 4 mmol/L (ref 0.5–1.9)

## 2021-08-08 LAB — BRAIN NATRIURETIC PEPTIDE: B Natriuretic Peptide: 386.2 pg/mL — ABNORMAL HIGH (ref 0.0–100.0)

## 2021-08-08 LAB — MAGNESIUM: Magnesium: 1.7 mg/dL (ref 1.7–2.4)

## 2021-08-08 LAB — MRSA NEXT GEN BY PCR, NASAL: MRSA by PCR Next Gen: NOT DETECTED

## 2021-08-08 MED ORDER — SUCCINYLCHOLINE CHLORIDE 200 MG/10ML IV SOSY
PREFILLED_SYRINGE | INTRAVENOUS | Status: AC
  Filled 2021-08-08: qty 10

## 2021-08-08 MED ORDER — VITAL HIGH PROTEIN PO LIQD
1000.0000 mL | ORAL | Status: DC
  Administered 2021-08-08 (×2): 1000 mL

## 2021-08-08 MED ORDER — DEXTROSE 50 % IV SOLN
1.0000 | Freq: Once | INTRAVENOUS | Status: AC
  Administered 2021-08-08: 50 mL via INTRAVENOUS
  Filled 2021-08-08: qty 50

## 2021-08-08 MED ORDER — ETOMIDATE 2 MG/ML IV SOLN
INTRAVENOUS | Status: AC
  Filled 2021-08-08: qty 20

## 2021-08-08 MED ORDER — DOCUSATE SODIUM 100 MG PO CAPS: 100.0000 mg | ORAL_CAPSULE | Freq: Two times a day (BID) | ORAL | Status: DC | PRN

## 2021-08-08 MED ORDER — ETOMIDATE 2 MG/ML IV SOLN: 20.0000 mg | Freq: Once | INTRAVENOUS | Status: AC

## 2021-08-08 MED ORDER — FENTANYL CITRATE (PF) 100 MCG/2ML IJ SOLN: 50.0000 ug | INTRAMUSCULAR | Status: DC | PRN

## 2021-08-08 MED ORDER — CHLORHEXIDINE GLUCONATE 0.12% ORAL RINSE (MEDLINE KIT)
15.0000 mL | Freq: Two times a day (BID) | OROMUCOSAL | Status: DC
  Administered 2021-08-08 – 2021-08-11 (×6): 15 mL via OROMUCOSAL

## 2021-08-08 MED ORDER — PHENYLEPHRINE 40 MCG/ML (10ML) SYRINGE FOR IV PUSH (FOR BLOOD PRESSURE SUPPORT)
80.0000 ug | PREFILLED_SYRINGE | Freq: Once | INTRAVENOUS | Status: AC
  Administered 2021-08-08: 80 ug via INTRAVENOUS
  Filled 2021-08-08: qty 10

## 2021-08-08 MED ORDER — ROCURONIUM BROMIDE 10 MG/ML (PF) SYRINGE
PREFILLED_SYRINGE | INTRAVENOUS | Status: AC
  Filled 2021-08-08: qty 10

## 2021-08-08 MED ORDER — ORAL CARE MOUTH RINSE
15.0000 mL | OROMUCOSAL | Status: DC
  Administered 2021-08-08 – 2021-08-11 (×26): 15 mL via OROMUCOSAL

## 2021-08-08 MED ORDER — VANCOMYCIN HCL IN DEXTROSE 1-5 GM/200ML-% IV SOLN
1000.0000 mg | Freq: Once | INTRAVENOUS | Status: AC
  Administered 2021-08-08: 1000 mg via INTRAVENOUS
  Filled 2021-08-08: qty 200

## 2021-08-08 MED ORDER — IPRATROPIUM-ALBUTEROL 0.5-2.5 (3) MG/3ML IN SOLN
3.0000 mL | RESPIRATORY_TRACT | Status: DC
  Administered 2021-08-08 – 2021-08-11 (×16): 3 mL via RESPIRATORY_TRACT
  Filled 2021-08-08 (×19): qty 3

## 2021-08-08 MED ORDER — FENTANYL CITRATE (PF) 100 MCG/2ML IJ SOLN
INTRAMUSCULAR | Status: AC
  Administered 2021-08-08: 50 ug via INTRAVENOUS
  Filled 2021-08-08: qty 2

## 2021-08-08 MED ORDER — NOREPINEPHRINE 4 MG/250ML-% IV SOLN
2.0000 ug/min | INTRAVENOUS | Status: DC
  Administered 2021-08-08: 2 ug/min via INTRAVENOUS
  Administered 2021-08-09: 7 ug/min via INTRAVENOUS
  Filled 2021-08-08 (×2): qty 250

## 2021-08-08 MED ORDER — PROSOURCE TF PO LIQD
45.0000 mL | Freq: Two times a day (BID) | ORAL | Status: DC
  Administered 2021-08-08 – 2021-08-09 (×2): 45 mL
  Filled 2021-08-08 (×2): qty 45

## 2021-08-08 MED ORDER — METRONIDAZOLE 500 MG/100ML IV SOLN
500.0000 mg | Freq: Once | INTRAVENOUS | Status: AC
Start: 2021-08-08 — End: 2021-08-08
  Administered 2021-08-08: 500 mg via INTRAVENOUS
  Filled 2021-08-08: qty 100

## 2021-08-08 MED ORDER — BUDESONIDE 0.5 MG/2ML IN SUSP
0.5000 mg | Freq: Two times a day (BID) | RESPIRATORY_TRACT | Status: DC
  Administered 2021-08-08 – 2021-08-11 (×6): 0.5 mg via RESPIRATORY_TRACT
  Filled 2021-08-08 (×7): qty 2

## 2021-08-08 MED ORDER — SODIUM CHLORIDE 0.9 % IV SOLN
2.0000 g | INTRAVENOUS | Status: DC
  Administered 2021-08-08 – 2021-08-11 (×4): 2 g via INTRAVENOUS
  Filled 2021-08-08 (×4): qty 20

## 2021-08-08 MED ORDER — FENTANYL CITRATE PF 50 MCG/ML IJ SOSY: 50.0000 ug | PREFILLED_SYRINGE | INTRAMUSCULAR | Status: DC | PRN

## 2021-08-08 MED ORDER — KETAMINE HCL 50 MG/5ML IJ SOSY
PREFILLED_SYRINGE | INTRAMUSCULAR | Status: AC
  Filled 2021-08-08: qty 5

## 2021-08-08 MED ORDER — POLYETHYLENE GLYCOL 3350 17 G PO PACK
17.0000 g | PACK | Freq: Every day | ORAL | Status: DC
  Administered 2021-08-08 – 2021-08-10 (×3): 17 g
  Filled 2021-08-08 (×3): qty 1

## 2021-08-08 MED ORDER — FENTANYL CITRATE (PF) 100 MCG/2ML IJ SOLN
50.0000 ug | INTRAMUSCULAR | Status: DC | PRN
Start: 2021-08-08 — End: 2021-08-11
  Administered 2021-08-08 – 2021-08-10 (×6): 50 ug via INTRAVENOUS
  Filled 2021-08-08 (×6): qty 2

## 2021-08-08 MED ORDER — POLYETHYLENE GLYCOL 3350 17 G PO PACK: 17.0000 g | PACK | Freq: Every day | ORAL | Status: DC | PRN

## 2021-08-08 MED ORDER — PANTOPRAZOLE SODIUM 40 MG IV SOLR
40.0000 mg | Freq: Every day | INTRAVENOUS | Status: DC
  Administered 2021-08-08 – 2021-08-09 (×2): 40 mg via INTRAVENOUS
  Filled 2021-08-08 (×2): qty 40

## 2021-08-08 MED ORDER — SODIUM CHLORIDE 0.9 % IV SOLN
250.0000 mL | INTRAVENOUS | Status: DC
  Administered 2021-08-08: 250 mL via INTRAVENOUS

## 2021-08-08 MED ORDER — ETOMIDATE 2 MG/ML IV SOLN
INTRAVENOUS | Status: AC
  Administered 2021-08-08: 20 mg via INTRAVENOUS
  Filled 2021-08-08: qty 20

## 2021-08-08 MED ORDER — DOCUSATE SODIUM 50 MG/5ML PO LIQD
100.0000 mg | Freq: Two times a day (BID) | ORAL | Status: DC
  Administered 2021-08-08 – 2021-08-11 (×6): 100 mg
  Filled 2021-08-08 (×6): qty 10

## 2021-08-08 MED ORDER — LACTATED RINGERS IV BOLUS
500.0000 mL | Freq: Once | INTRAVENOUS | Status: AC
  Administered 2021-08-08: 500 mL via INTRAVENOUS

## 2021-08-08 MED ORDER — LACTATED RINGERS IV SOLN: INTRAVENOUS | Status: AC

## 2021-08-08 MED ORDER — LACTATED RINGERS IV BOLUS
1000.0000 mL | Freq: Once | INTRAVENOUS | Status: AC
  Administered 2021-08-08: 1000 mL via INTRAVENOUS

## 2021-08-08 MED ORDER — SODIUM CHLORIDE 0.9 % IV SOLN
500.0000 mg | INTRAVENOUS | Status: DC
  Administered 2021-08-08: 500 mg via INTRAVENOUS
  Filled 2021-08-08 (×2): qty 500

## 2021-08-08 MED ORDER — FENTANYL CITRATE PF 50 MCG/ML IJ SOSY
PREFILLED_SYRINGE | INTRAMUSCULAR | Status: AC
  Filled 2021-08-08: qty 1

## 2021-08-08 MED ORDER — IPRATROPIUM-ALBUTEROL 0.5-2.5 (3) MG/3ML IN SOLN
RESPIRATORY_TRACT | Status: AC
  Administered 2021-08-08: 3 mL via RESPIRATORY_TRACT
  Filled 2021-08-08: qty 3

## 2021-08-08 MED ORDER — METHYLPREDNISOLONE SODIUM SUCC 40 MG IJ SOLR
40.0000 mg | Freq: Two times a day (BID) | INTRAMUSCULAR | Status: DC
  Administered 2021-08-08 – 2021-08-10 (×4): 40 mg via INTRAVENOUS
  Filled 2021-08-08 (×4): qty 1

## 2021-08-08 MED ORDER — SODIUM CHLORIDE 0.9 % IV SOLN
2.0000 g | Freq: Once | INTRAVENOUS | Status: AC
  Administered 2021-08-08: 2 g via INTRAVENOUS
  Filled 2021-08-08: qty 2

## 2021-08-08 MED ORDER — FENTANYL CITRATE (PF) 100 MCG/2ML IJ SOLN: 50.0000 ug | Freq: Once | INTRAMUSCULAR | Status: AC

## 2021-08-08 MED ORDER — ARFORMOTEROL TARTRATE 15 MCG/2ML IN NEBU
15.0000 ug | INHALATION_SOLUTION | Freq: Two times a day (BID) | RESPIRATORY_TRACT | Status: DC
  Administered 2021-08-08 – 2021-08-11 (×6): 15 ug via RESPIRATORY_TRACT
  Filled 2021-08-08 (×8): qty 2

## 2021-08-08 MED ORDER — FENTANYL CITRATE PF 50 MCG/ML IJ SOSY
50.0000 ug | PREFILLED_SYRINGE | INTRAMUSCULAR | Status: DC | PRN
Start: 2021-08-08 — End: 2021-08-08

## 2021-08-08 NOTE — ED Notes (Signed)
Portable cxr completed.

## 2021-08-08 NOTE — Progress Notes (Signed)
Critical ABG results given to Hammonds, NP. No orders received at this time.

## 2021-08-08 NOTE — ED Notes (Signed)
Family at bedside, state full code, unsure of patients wishes at this time, bipap initiated to ease work of breathing

## 2021-08-08 NOTE — Code Documentation (Signed)
Etomidate 20

## 2021-08-08 NOTE — ED Triage Notes (Signed)
Pt arrives via [facility] for respiratory distress. Pt intially 72% on home 3 lpm O2 via [place]. EMS placed pt on 15 lpm NRB with improvement to 92%. Pt's HR 120-160 with EMS. Pt noted to have diminished breath sounds per EMS.   EMS last VS - CBG 97, SBP 80.

## 2021-08-08 NOTE — Procedures (Signed)
Intubation Procedure Note  Dominique Clark  [id number]  Nov 06, 1941  Date:09-05-2021  Time:9:52 PM   Provider Performing:Akita Maxim R Andrea Colglazier    Procedure: Intubation (31500)  Indication(s) Respiratory Failure  Consent Unable to obtain consent due to emergent nature of procedure.   Anesthesia Etomidate and Fentanyl   Time Out Verified patient identification, verified procedure, site/side was marked, verified correct patient position, special equipment/implants available, medications/allergies/relevant history reviewed, required imaging and test results available.   Sterile Technique Usual hand hygeine, masks, and gloves were used   Procedure Description Patient positioned in bed supine.  Sedation given as noted above.  Patient was intubated with endotracheal tube using Glidescope.  View was Grade 1 full glottis .  Number of attempts was 1.  Colorimetric CO2 detector was consistent with tracheal placement.   Complications/Tolerance None; patient tolerated the procedure well. Chest X-ray is ordered to verify placement.   EBL Minimal   Specimen(s) None   Dominique Gasman [name], PA-C

## 2021-08-08 NOTE — Code Documentation (Signed)
Pt placed on vent see RT note for settings.  Pt tolerated well

## 2021-08-08 NOTE — Telephone Encounter (Signed)
Agee with ER.  Thanks.

## 2021-08-08 NOTE — Progress Notes (Signed)
I called the patient's daughter Lupita Leash [phone]) to discuss her mother's care. She understands how gravely ill her mother is and feels that she would want an opportunity to recover from this. She would not want to resuscitate in the event of a cardiac arrest, but she wants all other aggressive care at this point. DNR order placed in epic, RN updated.  Steffanie Dunn, DO Sep 07, 2021 4:22 PM [facility]

## 2021-08-08 NOTE — Code Documentation (Addendum)
Roccuronium 50mg  given

## 2021-08-08 NOTE — ED Notes (Signed)
Pt comfortable on vent at this time, adequate sedation

## 2021-08-08 NOTE — Progress Notes (Signed)
Elink following for code sepsis 

## 2021-08-08 NOTE — Code Documentation (Signed)
Time out completed

## 2021-08-08 NOTE — ED Provider Notes (Signed)
[facility]   CSN: [id number] Arrival date & time: 08/28/2021  1200     History Chief Complaint  Patient presents with   Respiratory Distress    Dominique Clark is a 80 y.o. female with past medical history of A. fib, asthma, COPD, TIA, anticoagulated with Eliquis, CHF, who presents today for evaluation of shortness of breath.  She states that she has been getting more and more sick since the weekend. Level 5 caveat for condition. Additional history is obtained from daughter.  She states that patient has been reporting chest pain over the past weekend.  Since the end of June daughter reports that she has been on 2 rounds of antibiotics and steroids for a possible chest infection without improvement.  She states that patient is vaccinated against COVID and denies patient having any known COVID contacts.  She states that usually patient is compliant with all of her medications including Eliquis.  Daughter feels like that patient has been increasingly confused since the weekend when patient is normally not confused at all.   Daughter states that patient has not voiced any diarrhea, vomiting, abdominal pain or other concerns recently. Patient has 2 children, and they were both earlier today at patient's house when they called 911 to get patient brought here.  When  I ask patient if her toes are normally purple she reports they are.   Patient is normally on 2-3 lpm at home.   HPI     Past Medical History:  Diagnosis Date   AF (atrial fibrillation) (HCC)    Asthma    COPD (chronic obstructive pulmonary disease) (HCC)    Diabetes mellitus    type 2   Diverticulosis    s/p colonoscopy   Gastritis    via EGD   Hiatal hernia    s/p dilation 2004   Osteopenia    Stress incontinence, female    TIA (transient ischemic attack)     Patient Active Problem List   Diagnosis Date Noted   Pneumonia 08/06/2021   HLD (hyperlipidemia)  02/10/2020   Dysuria 04/17/2019   Insomnia 02/06/2019   Low back pain 02/06/2019   Iron deficiency anemia 06/25/2018   SOB (shortness of breath) 06/16/2018   Acute CHF (congestive heart failure) (HCC) 06/16/2018   Acute on chronic diastolic CHF (congestive heart failure) (HCC) 06/15/2018   History of diabetes mellitus 05/13/2018   GERD (gastroesophageal reflux disease) 05/13/2018   Acute on chronic respiratory failure with hypoxia (HCC) 05/13/2018   COPD exacerbation (HCC) 05/13/2018   AF (atrial fibrillation) (HCC)    TIA (transient ischemic attack) 11/27/2017   Elevated serum creatinine 10/16/2016   Healthcare maintenance 10/16/2016   Essential hypertension 10/16/2016   Medicare annual wellness visit, subsequent 10/08/2015   Advance care planning 10/08/2015   Knee pain 02/23/2015   Stricture and stenosis of esophagus 02/20/2014   Special screening for malignant neoplasms, colon 01/30/2014   Hiatal hernia 04/20/2012   Dysphagia 03/09/2012   Vertigo, benign paroxysmal 05/02/2011   Anemia, unspecified 04/17/2011   Hyperglycemia 02/14/2011   Cerumen impaction 08/23/2009   INCONTINENCE, FEMALE STRESS 10/11/2008   GASTRITIS 05/30/2008   ESOPHAGEAL REFLUX 02/24/2008   Osteopenia 09/29/2007   NECK AND BACK PAIN 07/26/2007   COPD (chronic obstructive pulmonary disease) (HCC) 03/29/2007   DIVERTICULOSIS, COLON W/O HEM 03/29/2007    Past Surgical History:  Procedure Laterality Date   BLADDER REPAIR  9/04   bladder tack    BREAST  CYST EXCISION Right 1972   CATARACT EXTRACTION     TONSILLECTOMY     age 52   TOTAL ABDOMINAL HYSTERECTOMY       OB History   No obstetric history on file.     Family History  Problem Relation Age of Onset   Stroke Mother    Breast cancer Neg Hx    Colon cancer Neg Hx     Social History   Tobacco Use   Smoking status: Former    Types: Cigarettes    Quit date: 12/29/1988    Years since quitting: 32.6   Smokeless tobacco: Never   Tobacco  comments:    20 or more years   Substance Use Topics   Alcohol use: No    Alcohol/week: 0.0 standard drinks   Drug use: No    Home Medications Prior to Admission medications   Medication Sig Start Date End Date Taking? Authorizing Provider  albuterol (VENTOLIN HFA) 108 (90 Base) MCG/ACT inhaler INHALE 1 TO 2 PUFFS EVERY 4 HOURS AS NEEDED FOR WHEEZING 07/03/21   [name], MD  amoxicillin-clavulanate (AUGMENTIN) 875-125 MG tablet Take 1 tablet by mouth 2 (two) times daily. 07/03/21   [name], MD  apixaban (ELIQUIS) 5 MG TABS tablet Take 1 tablet (5 mg total) by mouth 2 (two) times daily. 07/03/21   [name], MD  atorvastatin (LIPITOR) 20 MG tablet Take 1 tablet (20 mg total) by mouth daily. 07/03/21   [name], MD  Cholecalciferol (VITAMIN D3) 50 MCG (2000 UT) capsule Take 1 capsule (2,000 Units total) by mouth daily. 06/26/21   [name], MD  diltiazem (CARDIZEM CD) 120 MG 24 hr capsule TAKE 1 CAPSULE AT BEDTIME (APPOINTMENT IS NEEDED FOR REFILLS) 07/03/21   [name], MD  docusate sodium (DOK) 100 MG capsule Take 1 capsule (100 mg total) by mouth 2 (two) times daily. 07/11/21 10/09/21  [name], MD  esomeprazole (NEXIUM) 20 MG capsule Take 1 capsule (20 mg total) by mouth daily at 12 noon. 07/03/21   [name], MD  ferrous sulfate (FEROSUL) 325 (65 FE) MG tablet TAKE 1 TABLET (325 MG TOTAL) BY MOUTH 2 (TWO) TIMES DAILY WITH A MEAL. 07/03/21   [name], MD  fluticasone [facility]) 50 MCG/ACT nasal spray Place 2 sprays into both nostrils daily as needed (seasonal allergies). 07/03/21   [name], MD  fluticasone-salmeterol [facility] INHUB) 250-50 MCG/ACT AEPB Inhale 1 puff into the lungs in the morning and at bedtime. 07/03/21   [name], MD  furosemide (LASIX) 20 MG tablet Take 1 tablet (20 mg total) by mouth daily as needed for fluid (take with potassium). 07/03/21   [name], MD  ipratropium-albuterol (DUONEB) 0.5-2.5 (3)  MG/3ML SOLN Use up to 4 times a day if needed. 07/03/21   [name], MD  metoprolol succinate (TOPROL-XL) 50 MG 24 hr tablet Take 1 tablet (50 mg total) by mouth daily. Take with or immediately following a meal. 07/03/21   [name], MD  NON FORMULARY Oxygen 3 liters 24/7    [provider]  potassium chloride (KLOR-CON) 10 MEQ tablet TAKE 1 TABLET EVERY DAY AS NEEDED (TAKE WITH LASIX). 07/03/21   [name], MD  predniSONE (DELTASONE) 10 MG tablet Take 2 a day for 5 days, then 1 a day for 5 days, with food. Don't take with aleve/ibuprofen. 07/15/21   [name], MD  traMADol (  ULTRAM) 50 MG tablet Take 1-2 tablets (50-100 mg total) by mouth every 12 (twelve) hours as needed. 06/25/21   [name], MD  traZODone (DESYREL) 50 MG tablet Take 0.5-1 tablets (25-50 mg total) by mouth at bedtime as needed for sleep. 06/25/21   [name], MD    Allergies    Doxycycline  Review of Systems   Review of Systems  Unable to perform ROS: Acuity of condition     Physical Exam Updated Vital Signs BP 98/66   Pulse 100   Temp 98.2 F (36.8 C) (Oral)   Resp 20   Ht 5\' 3"  (1.6 m)   SpO2 97%   BMI 19.49 kg/m   Physical Exam Vitals and nursing note reviewed.  Constitutional:      General: She is in acute distress.     Appearance: She is ill-appearing.     Comments: Opens eyes spontaneously, able to answer simple yes/no questions by nodding.   HENT:     Head: Normocephalic and atraumatic.     Mouth/Throat:     Mouth: Mucous membranes are dry.  Cardiovascular:     Rate and Rhythm: Tachycardia present. Rhythm irregular.     Heart sounds: No murmur heard. Pulmonary:     Effort: Respiratory distress present.     Breath sounds: Rhonchi and rales present.  Abdominal:     Tenderness: There is no abdominal tenderness.  Musculoskeletal:     Cervical back: Normal range of motion.     Right lower leg: Edema present.     Left lower leg: Edema present.  Skin:     Coloration: Skin is pale.     Comments: Bilaterally toes and distal feet are cyanotic. 4 second cap refill  Neurological:     Comments: Limited assessment due to patient having difficult time speaking.  She is awake, is able to answer simple yes/no questions, however limited ability to evaluate for orientation.  Moves all 4 extremities spontaneously.  Psychiatric:     Comments: Unable to assess    ED Results / Procedures / Treatments   Labs (all labs ordered are listed, but only abnormal results are displayed) Labs Reviewed  CBC WITH DIFFERENTIAL/PLATELET - Abnormal; Notable for the following components:      Result Value   WBC 24.3 (*)    MCV 103.3 (*)    MCHC 29.7 (*)    Neutro Abs 21.5 (*)    Lymphs Abs 0.3 (*)    Basophils Absolute 0.2 (*)    Abs Immature Granulocytes 1.87 (*)    All other components within normal limits  COMPREHENSIVE METABOLIC PANEL - Abnormal; Notable for the following components:   Potassium 5.6 (*)    Chloride 95 (*)    CO2 33 (*)    Glucose, Bld 57 (*)    BUN 49 (*)    Creatinine, Ser 2.06 (*)    Calcium 8.6 (*)    Total Protein 6.3 (*)    Albumin 2.7 (*)    AST 85 (*)    Total Bilirubin 1.3 (*)    GFR, Estimated 24 (*)    All other components within normal limits  PROTIME-INR - Abnormal; Notable for the following components:   Prothrombin Time 27.9 (*)    INR 2.6 (*)    All other components within normal limits  BRAIN NATRIURETIC PEPTIDE - Abnormal; Notable for the following components:   B Natriuretic Peptide 386.2 (*)    All other components within normal  limits  I-STAT ARTERIAL BLOOD GAS, ED - Abnormal; Notable for the following components:   pH, Arterial 7.206 (*)    pCO2 arterial 87.9 (*)    pO2, Arterial 165 (*)    Bicarbonate 35.0 (*)    TCO2 38 (*)    Acid-Base Excess 4.0 (*)    Potassium 5.2 (*)    Calcium, Ion 1.12 (*)    HCT 35.0 (*)    Hemoglobin 11.9 (*)    All other components within normal limits  I-STAT CHEM 8, ED -  Abnormal; Notable for the following components:   Potassium 5.3 (*)    BUN 51 (*)    Creatinine, Ser 2.10 (*)    Glucose, Bld 54 (*)    Calcium, Ion 1.01 (*)    TCO2 36 (*)    All other components within normal limits  I-STAT ARTERIAL BLOOD GAS, ED - Abnormal; Notable for the following components:   pH, Arterial 7.129 (*)    pCO2 arterial 101.7 (*)    pO2, Arterial 132 (*)    Bicarbonate 33.9 (*)    TCO2 37 (*)    Potassium 5.3 (*)    HCT 35.0 (*)    Hemoglobin 11.9 (*)    All other components within normal limits  TROPONIN I (HIGH SENSITIVITY) - Abnormal; Notable for the following components:   Troponin I (High Sensitivity) 46 (*)    All other components within normal limits  RESP PANEL BY RT-PCR (FLU A&B, COVID) ARPGX2  CULTURE, BLOOD (ROUTINE X 2)  CULTURE, BLOOD (ROUTINE X 2)  CULTURE, RESPIRATORY W GRAM STAIN  URINE CULTURE  BODY FLUID CULTURE W GRAM STAIN  LACTIC ACID, PLASMA  URINALYSIS, ROUTINE W REFLEX MICROSCOPIC  LACTIC ACID, PLASMA  MISCELLANEOUS GENETIC TEST  URINALYSIS, ROUTINE W REFLEX MICROSCOPIC  STREP PNEUMONIAE URINARY ANTIGEN  LEGIONELLA PNEUMOPHILA SEROGP 1 UR AG  PH, BODY FLUID  PROTEIN, PLEURAL OR PERITONEAL FLUID  LACTATE DEHYDROGENASE, PLEURAL OR PERITONEAL FLUID  BODY FLUID CELL COUNT WITH DIFFERENTIAL  CBG MONITORING, ED  CBG MONITORING, ED  CYTOLOGY - NON PAP  TROPONIN I (HIGH SENSITIVITY)    EKG EKG Interpretation  Date/Time:  Thursday August 08 2021 12:05:38 EDT Ventricular Rate:  125 PR Interval:    QRS Duration: 86 QT Interval:  312 QTC Calculation: 450 R Axis:   84 Text Interpretation: Atrial fibrillation with rapid ventricular response with premature ventricular or aberrantly conducted complexes Nonspecific ST and T wave abnormality Abnormal ECG No significant change since last tracing Confirmed by Susy Frizzle [phone]) on 08/20/2021 12:36:19 PM  Radiology DG Chest Portable 1 View  Result Date: 08/07/2021 CLINICAL DATA:   Short of breath 06/15/2018 EXAM: PORTABLE CHEST 1 VIEW COMPARISON:  None. FINDINGS: Heart size normal.  Negative for heart failure. Small right effusion and right lower lobe airspace disease. Possible mass density overlying the right hilum versus prominent vessel. Left lung clear. Large hiatal hernia. IMPRESSION: Right pleural effusion right lower lobe consolidation. Possible right hilar mass versus prominent vessel. Recommend CT chest, preferably with contrast, for further evaluation. Electronically Signed   By: Marlan Palau M.D.   On: 08/05/2021 13:04    Procedures .Critical Care  Date/Time: 08/16/2021 3:55 PM Performed by: Cristina Gong, PA-C Authorized by: Cristina Gong, PA-C   Critical care provider statement:    Critical care time (minutes):  76   Critical care time was exclusive of:  Separately billable procedures and treating other patients and teaching time   Critical care  was necessary to treat or prevent imminent or life-threatening deterioration of the following conditions:  Cardiac failure, circulatory failure, respiratory failure, sepsis, shock, dehydration and metabolic crisis   Critical care was time spent personally by me on the following activities:  Discussions with consultants, development of treatment plan with patient or surrogate, evaluation of patient's response to treatment, obtaining history from patient or surrogate, pulse oximetry, re-evaluation of patient's condition, ordering and review of radiographic studies, ordering and review of laboratory studies and ordering and performing treatments and interventions   I assumed direction of critical care for this patient from another provider in my specialty: no     Care discussed with: admitting provider     Medications Ordered in ED Medications  vancomycin (VANCOCIN) IVPB 1000 mg/200 mL premix (has no administration in time range)  lactated ringers infusion ( Intravenous New Bag/Given 08/17/2021 1328)   succinylcholine (ANECTINE) 200 MG/10ML syringe (  Not Given 07/29/2021 1528)  0.9 %  sodium chloride infusion (has no administration in time range)  norepinephrine (LEVOPHED)  in premix infusion (5 mcg/min Intravenous Rate/Dose Change 08/18/2021 1533)  0.9 %  sodium chloride infusion (has no administration in time range)  fentaNYL (SUBLIMAZE) injection 50 mcg (has no administration in time range)  fentaNYL (SUBLIMAZE) injection 50 mcg (has no administration in time range)  docusate sodium (COLACE) capsule 100 mg (has no administration in time range)  polyethylene glycol (MIRALAX / GLYCOLAX) packet 17 g (has no administration in time range)  pantoprazole (PROTONIX) injection 40 mg (has no administration in time range)  cefTRIAXone (ROCEPHIN) 2 g in sodium chloride 0.9 % 100 mL IVPB (has no administration in time range)  azithromycin (ZITHROMAX) 500 mg in sodium chloride 0.9 % 250 mL IVPB (has no administration in time range)  methylPREDNISolone sodium succinate (SOLU-MEDROL) 40 mg/mL injection 40 mg (has no administration in time range)  lactated ringers bolus 500 mL (0 mLs Intravenous Stopped 07/29/2021 1328)  ceFEPIme (MAXIPIME) 2 g in sodium chloride 0.9 % 100 mL IVPB (0 g Intravenous Stopped 08/09/2021 1409)  metroNIDAZOLE (FLAGYL) IVPB 500 mg (0 mg Intravenous Stopped 08/11/2021 1512)  dextrose 50 % solution 50 mL (50 mLs Intravenous Given 08/01/2021 1435)  rocuronium bromide 100 MG/10ML SOSY (  Given 08/17/2021 1508)  etomidate (AMIDATE) 2 MG/ML injection ( Intravenous Given by Other 07/30/2021 1507)  fentaNYL (SUBLIMAZE) 50 MCG/ML injection (  Given by Other 08/07/2021 1514)  PHENYLephrine 40 mcg/ml in normal saline Adult IV Push Syringe (For Blood Pressure Support) (80 mcg Intravenous Given 08/17/2021 1539)  lactated ringers bolus 1,000 mL (1,000 mLs Intravenous New Bag/Given 08/14/2021 1527)    ED Course  I have reviewed the triage vital signs and the nursing notes.  Pertinent labs & imaging results that  were available during my care of the patient were reviewed by me and considered in my medical decision making (see chart for details).  Clinical Course as of 08/11/2021 1647  Thu Aug 08, 2021  1236 Two rounds of antibiotics and steroids since end of June.  On 2 lpm at base line per daughter.  She reports patient has been confused.  She is unclear on code status and is unable to state if patient would want to be a DNR so for now will remain full code.  Patient has one other child, a twin of the daughter and daughter and son will discuss code status.  [EH]  1250 Patient's blood pressure noted to be in the 70s systolic.  I asked  And RN to ensure patient has good access, place pads as she may require cardioversion, give a 500 cc fluid bolus.  While patient is hypotensive we will hold on 30 cc/kg fluid bolus at this time as clinically patient appears to have a degree of body wide fluid overload and is already in respiratory distress.  Dr. Bernette Mayers aware [EH]  1306 RT was unable to draw ABG.  Their charge nurse will come attempt.  [EH]  1316 pCO2 arterial(!!): 87.9 pH 7.206 [EH]  1317 Pressure into the 90s systolic [EH]  1321 I-Stat arterial blood gas, ED(!!) Called respiratory for bipap [EH]  1346 I spoke with daughter on the phone and patient's brother who is at bedside.  I discussed the results.  Patient is currently on a trial of BiPAP.  Again brought up goals of care discussion with patient's daughter who has not decided for patient to be DNR as of now patient  remains full code.  [EH]  1456 I had a family discussion with both the patient's children, and patient's mother who are all in the room.  We discussed that given patient's worsening status on BiPAP that at this point she would either require intubation or be made comfort care.  We discussed at length these options, and at this point family is not ready to hold aggressive treatment. [EH]    Clinical Course User Index [EH] Norman Clay    MDM Rules/Calculators/A&P                         Patient is a 80 year old who presents today for evaluation of worsening shortness of breath and confusion. On arrival patient is in obvious respiratory distress.  She is requiring nonrebreather with bilateral rales and rhonchi.  She is noted to have pitting edema with cyanosis in her toes which she states is usual. Patient is in A. fib RVR on arrival, however chart review shows her most recent prior EKG in 2019 was also at about the same rate.  She has reportedly been compliant with her Eliquis and her other medications. Given that she had cough and shortness of breath with 2 courses of antibiotics and steroids and did not have significant resolution, infection still remains a possibility. Given the patient is hypotensive, altered and has this reported cough along with her tachycardia code sepsis is called and she is started on broad-spectrum antibiotics.  Chest x-ray shows a new right-sided consolidation, with a right lower lobe consolidation and possibly a right hilar mass versus a prominent vessel.  Initially due to patient's tachycardia, peripheral edema, and her occasionally spitting up/coughing out brown frothy material she did not get a full 30/kg fluid bolus as she appeared overall fluid overloaded.  She did originally get gentle rehydration with a 500 cc bolus which is an equivalent of 10/kg.  After this her blood pressures did improve, and her picture became less concerning for a cardiogenic cause of her symptoms and shock and at that point decision was made to give her an additional 1 L fluid bolus to bring her to a total of 30 cc/kg.  Given patient is in A. fib RVR with soft pressures cardiogenic shock and consideration of emergent cardioversion was discussed however given possible underlying sepsis and infection and improvement with fluids I suspect that this is more of a physiologic response to a underlying pathology rather than the cause  of her hypotension.    Labs are obtained her initial ABG  showed a pH of 7.208 with a PCO2 of 87, PaO2 of 165 with a bicarb of 35. She is placed on BiPAP.  After that for about an hour repeat ABG shows her pH is further dropped to 7.128 with a PCO2 rising up to 101.7. At this point BiPAP does not appear to be providing adequate respiratory support. I had extensive conversation with family including patient's daughter and son at bedside along with patient's brother.  They state that they are the primary family and that patients daughter and son will jointly make medical decisions for the patient and her daughter will be the main contact point if needed.  Both myself and Dr. Bernette Mayers discussed with them patient's results thus far, her worsening ABG despite being on BiPAP, her worsened mental status despite treatment of her hypoglycemia and results including possibility of consolidation versus mass on x-ray, worsening kidney function. At this point with a did not wish to make patient a DNR and wished for aggressive treatment, including intubation, pressor support, and CPR if necessary.  Patient was intubated, please see procedure note.  She was given push dose phenylephrine prior to RSI meds to help maintain adequate blood pressure.  She is started on a Levophed drip through peripheral IV.  Repeat chest x-ray shows endotracheal tube appears in satisfactory condition.   PCCM is consulted, I spoke with Dr. Katrinka Blazing who states that they will admit patient. Afterward I gave patient's daughter an update.  This patient was seen as a shared visit with Dr. Bernette Mayers, please see his note for intubation note.   Note: Portions of this report may have been transcribed using voice recognition software. Every effort was made to ensure accuracy; however, inadvertent computerized transcription errors may be present  Final Clinical Impression(s) / ED Diagnoses Final diagnoses:  Acute on chronic respiratory failure with  hypoxia and hypercapnia (HCC)  Pleural effusion  SIRS (systemic inflammatory response syndrome) [facility])    Rx / DC Orders ED Discharge Orders     None        Norman Clay 08/03/2021 1658    Pollyann Savoy, MD 08/09/21 [phone]

## 2021-08-08 NOTE — ED Notes (Signed)
Pt maintaining sats on bipap, tolerating well.  Son at bedside

## 2021-08-08 NOTE — ED Provider Notes (Signed)
Patient seen and examined, agree with assessment and plan by APP. Patient with worsening cough, SOB recently. No fevers. Lives at home alone and is on oxygen at all times. Initial ABG with significant respiratory acidosis, trailed on BiPAP without improvement, second ABG worsening. CXR with R pleural effusion with mass vs consolidation. AKI on chemistry panel. Long discussion with family regarding goals of care and they wish for her to be Full Code. Intubated for hypercapnia and respiratory failure. Initially responded to IVF bolus, but BP low again after intubation. Will begin levophed. Admit to ICU.   Procedure Name: Intubation Date/Time: 08-15-2021 3:16 PM Performed by: Pollyann Savoy, MD Pre-anesthesia Checklist: Patient identified, Patient being monitored, Emergency Drugs available, Suction available and Timeout performed Oxygen Delivery Method: Ambu bag Preoxygenation: Pre-oxygenation with 100% oxygen Induction Type: Rapid sequence Ventilation: Mask ventilation without difficulty Laryngoscope Size: Glidescope and 3 Grade View: Grade I Tube size: 7.5 mm Number of attempts: 1 Placement Confirmation: ETT inserted through vocal cords under direct vision, CO2 detector and Breath sounds checked- equal and bilateral Secured at: 23 cm Tube secured with: ETT holder    .Critical Care  Date/Time: August 15, 2021 3:17 PM Performed by: Pollyann Savoy, MD Authorized by: Pollyann Savoy, MD   Critical care provider statement:    Critical care time (minutes):  60   Critical care time was exclusive of:  Separately billable procedures and treating other patients   Critical care was necessary to treat or prevent imminent or life-threatening deterioration of the following conditions:  Respiratory failure, renal failure, sepsis, shock and cardiac failure   Critical care was time spent personally by me on the following activities:  Development of treatment plan with patient or surrogate, evaluation  of patient's response to treatment, examination of patient, obtaining history from patient or surrogate, ordering and performing treatments and interventions, ordering and review of radiographic studies, pulse oximetry, ordering and review of laboratory studies, re-evaluation of patient's condition and review of old charts    Pollyann Savoy, MD 2021/08/15 1519

## 2021-08-08 NOTE — Progress Notes (Signed)
Critical ABG results given to Bernette Mayers, MD. No new orders given at this time.

## 2021-08-08 NOTE — Progress Notes (Signed)
Patient transported on vent to 2M05 with no complications.

## 2021-08-08 NOTE — Telephone Encounter (Signed)
Patient son call in stated patient's not feeling good has not been eating ,shortness of breath,would like a call back to discuss medication , stated patient is to weak to come   in office . #[phone]

## 2021-08-08 NOTE — Progress Notes (Signed)
Notified bedside nurse of need to draw lactic acid.  

## 2021-08-08 NOTE — ED Notes (Signed)
Difficulty getting pulse ox to pick up peripherally, spot check reading on earlobe sat 98 on current vent settings

## 2021-08-08 NOTE — Progress Notes (Signed)
Notified provider of need to order lactic acid and repeat lactic acid.  

## 2021-08-08 NOTE — H&P (Signed)
NAME:  Dominique Clark, MRN:  [id number], DOB:  03/22/1941, LOS: 0 ADMISSION DATE:  June 02, 2021, CONSULTATION DATE:  June 02, 2021 REFERRING MD:  Dr. Bernette MayersSheldon, CHIEF COMPLAINT:  Acute respiratory failure    History of Present Illness:  Dominique Clark is a 80 y.o. female with a PMH significant for A-fib anticoagulated on Eliquis, COPD on home O2 2-3L, type 2 diabetes, TIA, and gastritis who presented to the ED with complaints of shortness of breath that started a couple days prior to admission. Daughters also reported recent history of chest pain. Daughters also report 2 rounds of antibiotics and steroids tapers since the end of June.   On ED arrival patient was seen with tachypnea, elevated BP, and hypoxia requiring NRB. Per chart review it appears patient continued to decompensated and required up titration of respiratory  support including BIPAP and eventual intubaiton. Labwork significant for K 5.6, CO2 33, BUN 49, Creat 2.06, and WBC 24.3. PCCM consulted for further management and admission.   Pertinent  Medical History  A-fib anticoagulated on Eliquis COPD (sees Dr. Para [facility]uncan at [facility]) Chronic hypoxic respiratory failure on 2-3L [place] Type 2 diabetes  TIA Gastritis  Significant Hospital Events:  8/11 admitted and intubated on ED arrival   Interim History / Subjective:  As above   Objective   Blood pressure (!) 90/44, pulse (!) 111, temperature 98.2 F (36.8 C), temperature source Oral, resp. rate 20, height 5\' 3"  (1.6 m), SpO2 97 %.    Vent Mode: PRVC FiO2 (%):  [80 %-100 %] 100 % Set Rate:  [12 bmp-20 bmp] 20 bmp Vt Set:  [410 mL] 410 mL PEEP:  [5 cmH20-6 cmH20] 5 cmH20 Plateau Pressure:  [19 cmH20] 19 cmH20  No intake or output data in the 24 hours ending 12/06/2021 1542 There were no vitals filed for this visit.  Examination: General: Thin, catechetic chronically ill appearing elderly female on mechanical ventilation, in NAD HEENT: ETT at 23 at lip, MM pink/moist, PERRL, pupils 3/=  reactive Neuro: sedated/ paralyzed CV: irir, cool extremities, barely palpable peripheral pulses PULM:  MV supported breaths, diffuse insp/ exp wheeze and scattered rales GI: soft, bs +, non-distended Extremities: cool/dry, +2 LE edema    R lateral pleural space  Resolved Hospital Problem list     Assessment & Plan:   Hypotension, possible septic shock vs hypotension after intubation/ sedation/ paralytics vs cardiogenic component given component of chest pain (and BNP 386) - her lactate was normal, reassuring at 1.6 - s/p 1500 ml LR in ER - MIVF as below - support with NE for MAP goal > 65 - pending UA - abx as below - follow culture data/ trend fever curve/ WBC - assess TTE and trend troponin's - trend PCT   Acute on chronic hypoxic/ hypercarbic respiratory failure- on home O2 2-3 [place] CAP- with failed outpt treatment AECOPD Right pleural effusion P:  - MV support, 8cc/kg IBW, rate 20 with goal Pplat <30 and DP<15  - pending post intubation ABG - VAP prevention protocol/ PPI - PAD protocol for sedation> prn fentanyl, RASS goal 0/-1 w/ bowel regimen - wean FiO2 as able for SpO2 >88-94%  - daily SAT & SBT - send sputum cx and check strep and legionella urine antigen - CAP coverage with azithro/ ceftriaxone and vanc for now, if MRSA neg, can d/c vanc - defer thoracentesis for now given Eliquis (last dose likely this am per family) and AKI, possibly 8/12 vs 8/13, likely to be simple parapneumonic but  given failed outpt treatment, need to rule out empyema  - solumedrol 40mg  q 12 - duonebs q4, pulmicort and brovanna nebs   AKI Mild hyperkalemia NAGMA - recheck BMET now, defer treatment of K 5.3, given no EKG at this time - hydration with LR 150 ml/hr - hemodynamic support as above - Trend BMP / urinary output - Replace electrolytes as indicated - Avoid nephrotoxic agents, ensure adequate renal perfusion   Afib - tele monitoring, currently rate controlled even on  NE - HAS- BLED score, high at least 5 with bleeding rate 12.5%/ yr, hold eliquis/ heparin bridge   C/o chest pain- could be related to CAP/ AECOPD - tele monitoring - BNP 386, trop hs 46 - trend trops and assess TTE   DMT2 - hypoglycemic in ER, recheck now, may need D10 gtt  - CBG q 4 when stabilized    Mild Transaminitis - supportive care - repeat LFTs in am   Protein- calorie malnutrition ? Failure to thrive  - Dietary consult/ start TF - early PMT consult   Best Practice (right click and "Reselect all SmartList Selections" daily)   Diet/type: NPO DVT prophylaxis: SCD GI prophylaxis: PPI Lines: N/A Foley:  Yes, and it is still needed Code Status:  full code changed to DNR after family discussion with Dr. Last date of multidisciplinary goals of care discussion [8/11]  Labs   CBC: Recent Labs  Lab 08/24/2021 1257 08/06/2021 1311 08/17/2021 1336 08/05/2021 1440  WBC 24.3*  --   --   --   NEUTROABS 21.5*  --   --   --   HGB 12.1 11.9* 13.3 11.9*  HCT 40.7 35.0* 39.0 35.0*  MCV 103.3*  --   --   --   PLT 211  --   --   --     Basic Metabolic Panel: Recent Labs  Lab 08/20/2021 1257 08/16/2021 1311 08/11/2021 1336 08/20/2021 1440  NA 138 137 138 138  K 5.6* 5.2* 5.3* 5.3*  CL 95*  --  98  --   CO2 33*  --   --   --   GLUCOSE 57*  --  54*  --   BUN 49*  --  51*  --   CREATININE 2.06*  --  2.10*  --   CALCIUM 8.6*  --   --   --    GFR: CrCl cannot be calculated (Unknown ideal weight.). Recent Labs  Lab 08/22/2021 1257 08/23/2021 1320  WBC 24.3*  --   LATICACIDVEN  --  1.6    Liver Function Tests: Recent Labs  Lab 08/07/2021 1257  AST 85*  ALT 39  ALKPHOS 68  BILITOT 1.3*  PROT 6.3*  ALBUMIN 2.7*   No results for input(s): LIPASE, AMYLASE in the last 168 hours. No results for input(s): AMMONIA in the last 168 hours.  ABG    Component Value Date/Time   PHART 7.129 (LL) 08/10/2021 1440   PCO2ART 101.7 (HH) 08/22/2021 1440   PO2ART 132 (H)  07/29/2021 1440   HCO3 33.9 (H) 08/09/2021 1440   TCO2 37 (H) 08/26/2021 1440   O2SAT 97.0 08/17/2021 1440     Coagulation Profile: Recent Labs  Lab 08/24/2021 1258  INR 2.6*    Cardiac Enzymes: No results for input(s): CKTOTAL, CKMB, CKMBINDEX, TROPONINI in the last 168 hours.  HbA1C: Hgb A1c MFr Bld  Date/Time Value Ref Range Status  02/02/2020 08:18 AM 6.0 4.6 - 6.5 % Final    Comment:  Glycemic Control Guidelines for People with Diabetes:Non Diabetic:  <6%Goal of Therapy: <7%Additional Action Suggested:  >8%   05/30/2019 09:26 AM 6.4 4.6 - 6.5 % Final    Comment:    Glycemic Control Guidelines for People with Diabetes:Non Diabetic:  <6%Goal of Therapy: <7%Additional Action Suggested:  >8%     CBG: No results for input(s): GLUCAP in the last 168 hours.  Review of Systems:   Unable   Past Medical History:  She,  has a past medical history of AF (atrial fibrillation) (HCC), Asthma, COPD (chronic obstructive pulmonary disease) (HCC), Diabetes mellitus, Diverticulosis, Gastritis, Hiatal hernia, Osteopenia, Stress incontinence, female, and TIA (transient ischemic attack).   Surgical History:   Past Surgical History:  Procedure Laterality Date   BLADDER REPAIR  9/04   bladder tack    BREAST CYST EXCISION Right 1972   CATARACT EXTRACTION     TONSILLECTOMY     age 76   TOTAL ABDOMINAL HYSTERECTOMY       Social History:   reports that she quit smoking about 32 years ago. Her smoking use included cigarettes. She has never used smokeless tobacco. She reports that she does not drink alcohol and does not use drugs.   Family History:  Her family history includes Stroke in her mother. There is no history of Breast cancer or Colon cancer.   Allergies Allergies  Allergen Reactions   Doxycycline Nausea And Vomiting     Home Medications  Prior to Admission medications   Medication Sig Start Date End Date Taking? Authorizing Provider  albuterol (VENTOLIN HFA) 108 (90  Base) MCG/ACT inhaler INHALE 1 TO 2 PUFFS EVERY 4 HOURS AS NEEDED FOR WHEEZING 07/03/21   [name], MD  amoxicillin-clavulanate (AUGMENTIN) 875-125 MG tablet Take 1 tablet by mouth 2 (two) times daily. 07/03/21   [name], MD  apixaban (ELIQUIS) 5 MG TABS tablet Take 1 tablet (5 mg total) by mouth 2 (two) times daily. 07/03/21   [name], MD  atorvastatin (LIPITOR) 20 MG tablet Take 1 tablet (20 mg total) by mouth daily. 07/03/21   [name], MD  Cholecalciferol (VITAMIN D3) 50 MCG (2000 UT) capsule Take 1 capsule (2,000 Units total) by mouth daily. 06/26/21   [name], MD  diltiazem (CARDIZEM CD) 120 MG 24 hr capsule TAKE 1 CAPSULE AT BEDTIME (APPOINTMENT IS NEEDED FOR REFILLS) 07/03/21   [name], MD  docusate sodium (DOK) 100 MG capsule Take 1 capsule (100 mg total) by mouth 2 (two) times daily. 07/11/21 10/09/21  [name], MD  esomeprazole (NEXIUM) 20 MG capsule Take 1 capsule (20 mg total) by mouth daily at 12 noon. 07/03/21   [name], MD  ferrous sulfate (FEROSUL) 325 (65 FE) MG tablet TAKE 1 TABLET (325 MG TOTAL) BY MOUTH 2 (TWO) TIMES DAILY WITH A MEAL. 07/03/21   [name], MD  fluticasone [facility]) 50 MCG/ACT nasal spray Place 2 sprays into both nostrils daily as needed (seasonal allergies). 07/03/21   [name], MD  fluticasone-salmeterol [facility]) 250-50 MCG/ACT AEPB Inhale 1 puff into the lungs in the morning and at bedtime. 07/03/21   [name], MD  furosemide (LASIX) 20 MG tablet Take 1 tablet (20 mg total) by mouth daily as needed for fluid (take with potassium). 07/03/21   [name], MD  ipratropium-albuterol (DUONEB) 0.5-2.5 (3) MG/3ML SOLN Use up to 4 times a day if needed. 07/03/21   [name],  MD  metoprolol succinate (TOPROL-XL) 50 MG 24 hr tablet Take 1 tablet (50 mg total) by mouth daily. Take with or immediately following a meal. 07/03/21   [name], MD  NON FORMULARY Oxygen 3 liters 24/7     [provider]  potassium chloride (KLOR-CON) 10 MEQ tablet TAKE 1 TABLET EVERY DAY AS NEEDED (TAKE WITH LASIX). 07/03/21   [name], MD  predniSONE (DELTASONE) 10 MG tablet Take 2 a day for 5 days, then 1 a day for 5 days, with food. Don't take with aleve/ibuprofen. 07/15/21   [name], MD  traMADol (ULTRAM) 50 MG tablet Take 1-2 tablets (50-100 mg total) by mouth every 12 (twelve) hours as needed. 06/25/21   [name], MD  traZODone (DESYREL) 50 MG tablet Take 0.5-1 tablets (25-50 mg total) by mouth at bedtime as needed for sleep. 06/25/21   [name], MD     Critical care time: 45 mins     Posey Boyer, ACNP [facility] 2021-08-20, 4:37 PM  See Amion for pager If no response to pager, please call PCCM consult pager After 7:00 pm call Elink

## 2021-08-08 NOTE — ED Notes (Signed)
ABG values not improving, to be intubated and placed on vent

## 2021-08-08 NOTE — Progress Notes (Signed)
eLink Physician-Brief Progress Note Patient Name: Dominique Clark DOB: Aug 24, 1941 MRN: [id number]   Date of Service  2021/08/16  HPI/Events of Note  Request to review portable CXR for ETT and OGT placement. ETT position is acceptable in distal trachea above the carina. OGT side port at the GE junction.  eICU Interventions  Plan: Advance OGT 8 cm. Portable abdominal film to confirm OGT position.      Intervention Category Major Interventions: Other:  Lenell Antu August 16, 2021, 10:20 PM

## 2021-08-08 NOTE — Progress Notes (Signed)
PCCM interval progress note:   Pt self extubated overnight and was initially hypoxic and minimally responsive, she improved rapidly with bagging and was 100% on arrival.  Pt had minimal respiratory effort, so was re-intubated.   DNR status, but family wanted aggressive care up to chest compressions.   [name] Najeh Credit, PA-C [facility] See Amion for pager If no response to pager , please call [phone] until 7pm After 7:00 pm call Elink  [phone]

## 2021-08-08 NOTE — ED Notes (Signed)
Report provided to unit, pt transported to 12M-05, b/p improved on pressors.

## 2021-08-08 NOTE — Progress Notes (Signed)
Fluids will not be given based on sepsis protocol d/t an element of shock and a fib RVR with rales (per provider)

## 2021-08-08 NOTE — Telephone Encounter (Addendum)
Called patient and her daughter answered the phone. Lupita Leash stated that sh is with her mom now. Lupita Leash stated that her mom is real weak and having difficulty breathing. Lupita Leash was advised that her mom should go to the hospital. Lupita Leash stated when her brother gets back they will try to get her to the ER but she is so weak. Lupita Leash stated that her mom does not want to go. Lupita Leash put patient on phone and she was having difficulty breathing. Patient was advised that she needs to go to the ER. Patient agreed to let her daughter call EMS to come get her and take her to the ER.

## 2021-08-08 NOTE — Progress Notes (Signed)
eLink Physician-Brief Progress Note Patient Name: Dominique Clark DOB: 07-Jul-1941 MRN: [id number]   Date of Service  08/07/2021  HPI/Events of Note  Request to review abdominal film for OGT position. OGT tip terminates at the abdominal midline, likely within the gastric body with the side port distal to the GE junction.  eICU Interventions  OK to use OGT.     Intervention Category Major Interventions: Other:  Lenell Antu 08/17/2021, 11:12 PM

## 2021-08-08 NOTE — ED Notes (Signed)
RT at bedside initiating Bipap.

## 2021-08-08 NOTE — Telephone Encounter (Signed)
PLEASE NOTE: All timestamps contained within this report are represented as [place] Standard Time. CONFIDENTIALTY NOTICE: This fax transmission is intended only for the addressee. It contains information that is legally privileged, confidential or otherwise protected from use or disclosure. If you are not the intended recipient, you are strictly prohibited from reviewing, disclosing, copying using or disseminating any of this information or taking any action in reliance on or regarding this information. If you have received this fax in error, please notify us immediately by telephone so that we can arrange for its return to [place]. Phone: [phone], Toll-Free: [phone], Fax: [phone] Page: 1 of 2 Call Id: [id number] [facility] Day - Client TELEPHONE ADVICE RECORD Access[facility]urse Patient Name: Dominique Clark Gender: Female DOB: 1941-01-20 Age: 80 Y 6 D Return Phone Number: [phone] (Primary), [phone] (Secondary), [phone] (Alternate) Address: [place]  [address] Client [facility] Day - Client Client Site [facility] - Day Physician Raechel Ache - MD Contact Type Call Who Is Calling Patient / Member / Family / Caregiver Call Type Triage / Clinical Caller Name Ardeth Repetto Relationship To Patient Son Return Phone Number [phone] (Primary) Chief Complaint Abdominal Pain Reason for Call Symptomatic / Request for Health Information Initial Comment Caller states his mother has not been talking or eating for the past few days. She recently had two rounds of antibiotics. She is drinking a Michaelis bit of tea. She stated her stomach was hurting this morning. Additional Comment The antibiotics were for congestion and cold symptoms. Translation No Nurse Assessment Nurse: Darlin Coco, RN, Charyl Dancer Date/Time (Eastern Time): 2021-08-27 8:40:45 AM Confirm and document reason for  call. If symptomatic, describe symptoms. ---Caller states mother has not been eating/talking for the past 2 days. Has been having abdominal pain this morning. States has finished 2 rounds of abx for cold/congestion sx. Denies known fever or any other symptoms. States is not with patient at this time. Does the patient have any new or worsening symptoms? ---Yes Will a triage be completed? ---Yes Related visit to physician within the last 2 weeks? ---No Does the PT have any chronic conditions? (i.e. diabetes, asthma, this includes High risk factors for pregnancy, etc.) ---Unknown Is this a behavioral health or substance abuse call? ---No Guidelines Guideline Title Affirmed Question Affirmed Notes Nurse Date/Time Lamount Cohen Time) Difficult Call [1] Overly worried caller AND [2] can't be reassured by triager Darlin Coco, RN, Herminio Commons Janie Aug 27, 2021 8:45:11 AM PLEASE NOTE: All timestamps contained within this report are represented as [place] Standard Time. CONFIDENTIALTY NOTICE: This fax transmission is intended only for the addressee. It contains information that is legally privileged, confidential or otherwise protected from use or disclosure. If you are not the intended recipient, you are strictly prohibited from reviewing, disclosing, copying using or disseminating any of this information or taking any action in reliance on or regarding this information. If you have received this fax in error, please notify us immediately by telephone so that we can arrange for its return to [place]. Phone: [phone], Toll-Free: [phone], Fax: [phone] Page: 2 of 2 Call Id: [id number] Disp. Time Lamount Cohen Time) Disposition Final User 08/27/2021 8:46:31 AM See HCP within 4 Hours (or PCP triage) Yes Cazares, RN, Charyl Dancer Caller Disagree/Comply Comply Caller Understands Yes PreDisposition InappropriateToAsk Care Advice Given Per Guideline SEE HCP (OR PCP TRIAGE) WITHIN 4 HOURS: CARE  ADVICE given per Difficult Call (Adult) guideline. CALL BACK IF: * You become worse Comments User: Herminio Commons  Cain Saupe, RN Date/Time Lamount Cohen Time): 2021-08-21 8:50:14 AM No answer at office backline [phone]. Transferrd caller to office mainline [phone] for caller to make an appt. Referrals REFERRED TO PCP OFFICE

## 2021-08-08 NOTE — ED Provider Notes (Signed)
Emergency Medicine Provider Triage Evaluation Note  Dominique Clark , a 80 y.o. female  was evaluated in triage.  Pt complains of shortness of breath.  Patient is on 3 LPM of O2 continuously.  Patient is unsure when her shortness of breath started.  Patient has history of atrial fibrillation, is unsure if she is constantly in atrial fibrillation.  States that she takes Eliquis.  Took Eliquis earlier this morning.  Patient denies any known sick contacts.  Review of Systems  Positive: Shortness of breath Negative: Confusion, fevers, chills, cough, chest pain, palpitations, leg swelling abdominal pain, nausea, vomiting  Physical Exam  BP 101/69   Pulse 97   Temp 98.2 F (36.8 C) (Oral)   Resp (!) 32   Ht 5\' 9"  (1.753 m)   SpO2 97%   BMI 16.24 kg/m  Gen:   Awake Resp:  Increased effort of breathing, decreased lung sounds in all lung fields. MSK:   Moves extremities without difficulty  Other:    Medical Decision Making  Medically screening exam initiated at 12:12 PM.  Appropriate orders placed.  Dannah I Kumagai was informed that the remainder of the evaluation will be completed by another provider, this initial triage assessment does not replace that evaluation, and the importance of remaining in the ED until their evaluation is complete.  Patient will be moved back to next available room due to A. fib with RVR and increased effort of breathing.   Stark Klein August 24, 2021 1214    10/08/21, MD 08/24/2021 [phone]

## 2021-08-09 ENCOUNTER — Inpatient Hospital Stay (HOSPITAL_COMMUNITY): Payer: Medicare HMO

## 2021-08-09 DIAGNOSIS — Z515 Encounter for palliative care: Secondary | ICD-10-CM

## 2021-08-09 DIAGNOSIS — J449 Chronic obstructive pulmonary disease, unspecified: Secondary | ICD-10-CM

## 2021-08-09 DIAGNOSIS — N179 Acute kidney failure, unspecified: Secondary | ICD-10-CM

## 2021-08-09 DIAGNOSIS — J9621 Acute and chronic respiratory failure with hypoxia: Secondary | ICD-10-CM | POA: Diagnosis not present

## 2021-08-09 DIAGNOSIS — I4891 Unspecified atrial fibrillation: Secondary | ICD-10-CM | POA: Diagnosis not present

## 2021-08-09 DIAGNOSIS — I9589 Other hypotension: Secondary | ICD-10-CM

## 2021-08-09 DIAGNOSIS — R6521 Severe sepsis with septic shock: Secondary | ICD-10-CM

## 2021-08-09 DIAGNOSIS — A491 Streptococcal infection, unspecified site: Secondary | ICD-10-CM

## 2021-08-09 DIAGNOSIS — J90 Pleural effusion, not elsewhere classified: Secondary | ICD-10-CM

## 2021-08-09 LAB — BLOOD CULTURE ID PANEL (REFLEXED) - BCID2

## 2021-08-09 LAB — MAGNESIUM
Magnesium: 1.5 mg/dL — ABNORMAL LOW (ref 1.7–2.4)
Magnesium: 2.6 mg/dL — ABNORMAL HIGH (ref 1.7–2.4)

## 2021-08-09 LAB — CBC
HCT: 38.1 % (ref 36.0–46.0)
Hemoglobin: 11.3 g/dL — ABNORMAL LOW (ref 12.0–15.0)
MCH: 30.2 pg (ref 26.0–34.0)
MCHC: 29.7 g/dL — ABNORMAL LOW (ref 30.0–36.0)
MCV: 101.9 fL — ABNORMAL HIGH (ref 80.0–100.0)
Platelets: 188 10*3/uL (ref 150–400)
RBC: 3.74 MIL/uL — ABNORMAL LOW (ref 3.87–5.11)
RDW: 13.4 % (ref 11.5–15.5)
WBC: 16.4 10*3/uL — ABNORMAL HIGH (ref 4.0–10.5)
nRBC: 0.2 % (ref 0.0–0.2)

## 2021-08-09 LAB — GLUCOSE, CAPILLARY
Glucose-Capillary: 128 mg/dL — ABNORMAL HIGH (ref 70–99)
Glucose-Capillary: 186 mg/dL — ABNORMAL HIGH (ref 70–99)
Glucose-Capillary: 173 mg/dL — ABNORMAL HIGH (ref 70–99)
Glucose-Capillary: 173 mg/dL — ABNORMAL HIGH (ref 70–99)
Glucose-Capillary: 200 mg/dL — ABNORMAL HIGH (ref 70–99)
Glucose-Capillary: 216 mg/dL — ABNORMAL HIGH (ref 70–99)

## 2021-08-09 LAB — HEPATIC FUNCTION PANEL
ALT: 140 U/L — ABNORMAL HIGH (ref 0–44)
AST: 375 U/L — ABNORMAL HIGH (ref 15–41)
Albumin: 2.2 g/dL — ABNORMAL LOW (ref 3.5–5.0)
Alkaline Phosphatase: 56 U/L (ref 38–126)
Bilirubin, Direct: 0.7 mg/dL — ABNORMAL HIGH (ref 0.0–0.2)
Indirect Bilirubin: 0.4 mg/dL (ref 0.3–0.9)
Total Bilirubin: 1.1 mg/dL (ref 0.3–1.2)
Total Protein: 5.6 g/dL — ABNORMAL LOW (ref 6.5–8.1)

## 2021-08-09 LAB — ECHOCARDIOGRAM COMPLETE
AR max vel: 1.25 cm2
AV Area VTI: 1.22 cm2
AV Area mean vel: 1.36 cm2
AV Mean grad: 4 mmHg
AV Peak grad: 7.1 mmHg
Ao pk vel: 1.33 m/s
Calc EF: 41.9 %
Height: 63 in
MV M vel: 4.11 m/s
MV Peak grad: 67.6 mmHg
S' Lateral: 2.8 cm
Single Plane A2C EF: 39.6 %
Single Plane A4C EF: 43.8 %
Weight: 1851.86 oz

## 2021-08-09 LAB — BASIC METABOLIC PANEL
Anion gap: 12 (ref 5–15)
BUN: 52 mg/dL — ABNORMAL HIGH (ref 8–23)
CO2: 25 mmol/L (ref 22–32)
Calcium: 8.1 mg/dL — ABNORMAL LOW (ref 8.9–10.3)
Chloride: 100 mmol/L (ref 98–111)
Creatinine, Ser: 2.04 mg/dL — ABNORMAL HIGH (ref 0.44–1.00)
GFR, Estimated: 24 mL/min — ABNORMAL LOW (ref >60)
Glucose, Bld: 136 mg/dL — ABNORMAL HIGH (ref 70–99)
Potassium: 4.7 mmol/L (ref 3.5–5.1)
Sodium: 137 mmol/L (ref 135–145)

## 2021-08-09 LAB — LACTIC ACID, PLASMA
Lactic Acid, Venous: 4 mmol/L (ref 0.5–1.9)
Lactic Acid, Venous: 3.7 mmol/L (ref 0.5–1.9)

## 2021-08-09 LAB — PHOSPHORUS
Phosphorus: 4.4 mg/dL (ref 2.5–4.6)
Phosphorus: 2.9 mg/dL (ref 2.5–4.6)

## 2021-08-09 LAB — BRAIN NATRIURETIC PEPTIDE: B Natriuretic Peptide: 356.7 pg/mL — ABNORMAL HIGH (ref 0.0–100.0)

## 2021-08-09 MED ORDER — SODIUM CHLORIDE 0.9 % IV SOLN
50.0000 mL/h | INTRAVENOUS | Status: DC
  Administered 2021-08-09: 50 mL/h via INTRAVENOUS

## 2021-08-09 MED ORDER — CHLORHEXIDINE GLUCONATE CLOTH 2 % EX PADS
6.0000 | MEDICATED_PAD | Freq: Every day | CUTANEOUS | Status: DC
  Administered 2021-08-08 – 2021-08-11 (×4): 6 via TOPICAL

## 2021-08-09 MED ORDER — APIXABAN 2.5 MG PO TABS
2.5000 mg | ORAL_TABLET | Freq: Two times a day (BID) | ORAL | Status: DC
  Administered 2021-08-09: 2.5 mg via ORAL
  Filled 2021-08-09: qty 1

## 2021-08-09 MED ORDER — OSMOLITE 1.2 CAL PO LIQD
1000.0000 mL | ORAL | Status: DC
  Administered 2021-08-09 – 2021-08-10 (×2): 1000 mL
  Filled 2021-08-09 (×3): qty 1000

## 2021-08-09 MED ORDER — SODIUM CHLORIDE 0.9 % IV SOLN
250.0000 mL | INTRAVENOUS | Status: DC
Start: 2021-08-09 — End: 2021-08-12
  Administered 2021-08-09: 250 mL via INTRAVENOUS

## 2021-08-09 MED ORDER — PHENYLEPHRINE HCL-NACL 20-0.9 MG/250ML-% IV SOLN
0.0000 ug/min | INTRAVENOUS | Status: DC
Start: 2021-08-09 — End: 2021-08-09

## 2021-08-09 MED ORDER — AMIODARONE HCL IN DEXTROSE 360-4.14 MG/200ML-% IV SOLN
60.0000 mg/h | INTRAVENOUS | Status: AC
  Administered 2021-08-09 (×2): 60 mg/h via INTRAVENOUS
  Filled 2021-08-09: qty 200

## 2021-08-09 MED ORDER — MAGNESIUM SULFATE 4 GM/100ML IV SOLN
4.0000 g | Freq: Once | INTRAVENOUS | Status: AC
  Administered 2021-08-09: 4 g via INTRAVENOUS
  Filled 2021-08-09: qty 100

## 2021-08-09 MED ORDER — AMIODARONE HCL IN DEXTROSE 360-4.14 MG/200ML-% IV SOLN
30.0000 mg/h | INTRAVENOUS | Status: DC
  Administered 2021-08-09 – 2021-08-11 (×4): 30 mg/h via INTRAVENOUS
  Filled 2021-08-09 (×5): qty 200

## 2021-08-09 MED ORDER — AMIODARONE LOAD VIA INFUSION
150.0000 mg | Freq: Once | INTRAVENOUS | Status: AC
  Administered 2021-08-09: 150 mg via INTRAVENOUS
  Filled 2021-08-09: qty 83.34

## 2021-08-09 MED ORDER — PHENYLEPHRINE HCL-NACL 20-0.9 MG/250ML-% IV SOLN
25.0000 ug/min | INTRAVENOUS | Status: DC
  Administered 2021-08-09 – 2021-08-10 (×3): 25 ug/min via INTRAVENOUS
  Administered 2021-08-11: 25.067 ug/min via INTRAVENOUS
  Filled 2021-08-09 (×2): qty 250

## 2021-08-09 MED ORDER — SODIUM CHLORIDE 0.9 % IV BOLUS
1000.0000 mL | Freq: Once | INTRAVENOUS | Status: AC
  Administered 2021-08-09: 1000 mL via INTRAVENOUS

## 2021-08-09 MED ORDER — DOCUSATE SODIUM 50 MG/5ML PO LIQD: 100.0000 mg | Freq: Two times a day (BID) | ORAL | Status: DC | PRN

## 2021-08-09 MED ORDER — PROSOURCE TF PO LIQD
45.0000 mL | Freq: Every day | ORAL | Status: DC
  Administered 2021-08-10 – 2021-08-11 (×2): 45 mL
  Filled 2021-08-09 (×2): qty 45

## 2021-08-09 MED ORDER — POLYETHYLENE GLYCOL 3350 17 G PO PACK: 17.0000 g | PACK | Freq: Every day | ORAL | Status: DC | PRN

## 2021-08-09 NOTE — Progress Notes (Signed)
*  PRELIMINARY RESULTS* Echocardiogram 2D Echocardiogram has been performed.  Neomia Dear RDCS 08/09/2021, 9:28 AM

## 2021-08-09 NOTE — Progress Notes (Signed)
Initial Nutrition Assessment  DOCUMENTATION CODES:   Severe malnutrition in context of chronic illness  INTERVENTION:   Tube feeding via OG tube: Osmolite 1.2 at 45 ml/h (1080 ml per day) Prosource TF 45 ml daily  Provides 1336 kcal, 71 gm protein, 875 ml free water daily  MVI with minerals daily    NUTRITION DIAGNOSIS:   Severe Malnutrition related to chronic illness (COPD) as evidenced by severe fat depletion, severe muscle depletion.  GOAL:   Patient will meet greater than or equal to 90% of their needs  MONITOR:   TF tolerance  REASON FOR ASSESSMENT:   Consult, Ventilator Enteral/tube feeding initiation and management  ASSESSMENT:   Pt with PMH of afib on Eliquis, COPD, chronic hypoxic respiratory failure on 2-3 L [place] at home, DM, TIA, and gastritis admitted with AECOPD and CAP.    Pt awake on vent.  Palliative care following. Pt DNR but wants aggressive care.    Patient is currently intubated on ventilator support MV: 8.4 L/min Temp (24hrs), Avg:97.6 F (36.4 C), Min:97.1 F (36.2 C), Max:98.4 F (36.9 C)  Medications reviewed and include: colace, solumedrol, protonix, miralax  Levophed @ 6 mcg  Amiodarone  Labs reviewed: BNP: 356 CBG's: 173-186  16 F OG tube; tip gastric   NUTRITION - FOCUSED PHYSICAL EXAM:  Flowsheet Row Most Recent Value  Orbital Region Severe depletion  Upper Arm Region Severe depletion  Thoracic and Lumbar Region Severe depletion  Buccal Region Severe depletion  Temple Region Severe depletion  Clavicle Bone Region Severe depletion  Clavicle and Acromion Bone Region Severe depletion  Scapular Bone Region Severe depletion  Dorsal Hand Moderate depletion  Patellar Region No depletion  Anterior Thigh Region No depletion  Posterior Calf Region Unable to assess  Edema (RD Assessment) Unable to assess  Hair Reviewed  Eyes Reviewed  Mouth Unable to assess  Skin Reviewed  Rosine Abe dry skin]  Nails Reviewed       Diet  Order:   Diet Order             Diet NPO time specified  Diet effective now                   EDUCATION NEEDS:   Not appropriate for education at this time  Skin:  Skin Assessment:  (non-pressure wound on R foot)  Last BM:  unknown  Height:   Ht Readings from Last 1 Encounters:  14-Aug-2021 5\' 3"  (1.6 m)    Weight:   Wt Readings from Last 1 Encounters:  08/09/21 52.5 kg    BMI:  Body mass index is 20.5 kg/m.  Estimated Nutritional Needs:   Kcal:  1300  Protein:  65-80 grams  Fluid:  >1.5 L/day  10/09/21., RD, LDN, CNSC See AMiON for contact information

## 2021-08-09 NOTE — H&P (Addendum)
NAME:  Dominique Clark, MRN:  [id number], DOB:  November 05, 1941, LOS: 1 ADMISSION DATE:  08/17/2021, CONSULTATION DATE:  08/22/2021 REFERRING MD:  Dr. Bernette Mayers, CHIEF COMPLAINT:  Acute respiratory failure    History of Present Illness:  Dominique Clark is a 80 y.o. female with a PMH significant for A-fib anticoagulated on Eliquis, COPD on home O2 2-3L, type 2 diabetes, TIA, and gastritis who presented to the ED with complaints of shortness of breath that started a couple days prior to admission. Daughters also reported recent history of chest pain. Daughters also report 2 rounds of antibiotics and steroids tapers since the end of June.   On ED arrival patient was seen with tachypnea, elevated BP, and hypoxia requiring NRB. Per chart review it appears patient continued to decompensated and required up titration of respiratory  support including BIPAP and eventual intubaiton. Labwork significant for K 5.6, CO2 33, BUN 49, Creat 2.06, and WBC 24.3. PCCM consulted for further management and admission.   Pertinent  Medical History  A-fib anticoagulated on Eliquis COPD (sees Dr. Para March at [facility]) Chronic hypoxic respiratory failure on 2-3L  Type 2 diabetes  TIA Gastritis  Significant Hospital Events:  8/11 admitted and intubated on ED arrival  8/11 self extubated and re-intubated, DNR status established, Went into a fib with RVR , now on Amio 8/12>> Blood Cx + for Strep pneumonia, on Rocephin  Interim History / Subjective:  Self extubated over night, required re-intubation. Made DNR by daughter, but wants aggressive care otherwise.  Currently on on Levo at 6, Amio at 60, and getting LR at 150/hr.  She remains tachy, a fib Strep Pneumonia per Blood Cx. Drawn 8/11 On Rocephin + 5.4  L but only 86  cc UO last 24 hours. Creatinine 1.98>> 2.04 today, BUN 49-52 Mag at 1 am was 1.5, but patient received 4 grams mag overnight WBC 24.3 on 8/11, now 16.4 ( ? How much is dilutional), HGB 11.3, platelets are 188,  T Max 98. 2 She is receiving prn fentanyl as she drops her pressures with sedation Echo is pending, HS Trop is 48 ( was 46 on admission) Need thoracentesis 8/12, had to wait for wash out of anticoagulant Feet are mottled with pulses that are difficult to doppler L>R Current Vent settings noted below CXR over night with mild interstitial edema  Objective   Blood pressure 100/80, pulse (!) 101, temperature 97.9 F (36.6 C), temperature source Oral, resp. rate 20, height 5\' 3"  (1.6 m), weight 52.5 kg, SpO2 98 %.    Vent Mode: PRVC FiO2 (%):  [40 %-100 %] 40 % Set Rate:  [12 bmp-20 bmp] 20 bmp Vt Set:  [410 mL] 410 mL PEEP:  [5 cmH20-6 cmH20] 5 cmH20 Plateau Pressure:  [18 cmH20-26 cmH20] 26 cmH20   Intake/Output Summary (Last 24 hours) at 08/09/2021 0824 Last data filed at 08/09/2021 0600 Gross per 24 hour  Intake 4981.98 ml  Output 86 ml  Net 4895.98 ml   Filed Weights   08/01/2021 1645 08/09/21 0500  Weight: 51.9 kg 52.5 kg    Examination: General: Thin, catechetic chronically ill appearing elderly female on mechanical ventilation, in NAD HEENT: ETT at 23 at lip, MM pink/moist, PERRL, pupils 3/= reactive, No LAD, No JVD Neuro: No sedation, arouses easily gets agitated, MAE x 4, not following commands CV: irir, cool extremities, barely palpable peripheral pulses, sluggish cap refill PULM:  Bilateral chest excursion, MV supported breaths, Coarse throughout GI: soft, bs +, non-distended Extremities:  cool/dry, +1 LE edema , mottling to feet and toes bilaterally   R lateral pleural space  Resolved Hospital Problem list     Assessment & Plan:   Hypotension, possible septic shock vs hypotension after intubation/ sedation/ paralytics vs cardiogenic component given component of chest pain (and BNP 386) BC + For Strep Pneumonia, Pro calcitonin 13.68 - her lactate was normal, reassuring at 1.6 - s/p 1500 ml LR in ER - MIVF as below - support with levo for MAP goal > 65 - UA  negative for nitrites, leukocytes - abx as below - follow culture data/ trend fever curve/ WBC - assess TTE and trend troponin's - trend PCT   Acute on chronic hypoxic/ hypercarbic respiratory failure- on home O2 2-3 [place] CAP- with failed outpt treatment AECOPD Right pleural effusion P:  - MV support, 8cc/kg IBW, rate 20 with goal Pplat <30 and DP<15  - pending post intubation ABG - VAP prevention protocol/ PPI - PAD protocol for sedation> prn fentanyl, RASS goal 0/-1 w/ bowel regimen - wean FiO2 as able for SpO2 >88-94%  - daily SAT & SBT - send sputum cx and check strep and legionella urine antigen - CAP coverage with azithro/ ceftriaxone and vanc for now, if MRSA neg, can d/c vanc - defer thoracentesis for now given Eliquis (last dose likely this am per family) and AKI, possibly 8/12 vs 8/13, likely to be simple parapneumonic but given failed outpt treatment, need to rule out empyema  - solumedrol 40mg  q 12 - duonebs q4, pulmicort and brovanna nebs    AKI Mild hyperkalemia>> resolved 8/12 Worsening creatinine and minimal UO last 24 hours NAGMA - Trend BMET defer treatment of K 5.3, given no EKG at this time - hydration with NS at 50 per hour - hemodynamic support as above - Trend BMP / urinary output - Replace electrolytes as indicated - Avoid nephrotoxic agents, ensure adequate renal perfusion - Consider Renal 10/12 - consider Renal Consult   Afib Converted to A fib with RVR overnight with re-intubation - Continue Amiodarone  - tele monitoring, currently rate controlled even on NE - HAS- BLED score, high at least 5 with bleeding rate 12.5%/ yr, hold eliquis/ heparin bridge   C/o chest pain- could be related to CAP/ AECOPD - tele monitoring - BNP 386, trop hs 46 - trend trops and assess TTE - EKG prn   DMT2 - hypoglycemic in ER, resolved , now on TF - CBG q 4  with SSI   Mild Transaminitis - supportive care - repeat LFTs in am   Protein- calorie  malnutrition TF started 8/11, at 40 cc per hour ? Failure to thrive  - Dietary consult/ start TF>> appreciate assist - early PMT consult  Mottled Lower Extremities Plan Doppler pulses Q 4 with assessment Consider bilateral Lower Extremity Dopplers  Palliative Care Consult made 8/11 for goals of care conversations , patient is a DNR, but if she does not turn around short term , need to consider  comfort care conversations with family.   Best Practice (right click and "Reselect all SmartList Selections" daily)   Diet/type: Tube Feeds DVT prophylaxis: SCD GI prophylaxis: PPI Lines: N/A Foley:  Yes, and it is still needed Code Status:  full code changed to DNR after family discussion with Dr. 10/11 Last date of multidisciplinary goals of care discussion [8/11]  Labs   CBC: Recent Labs  Lab 08/01/2021 1257 08/25/2021 1311 08/03/2021 1336 08/05/2021 1440 08/03/2021 1618 08/09/21 0141  WBC  24.3*  --   --   --   --  16.4*  NEUTROABS 21.5*  --   --   --   --   --   HGB 12.1 11.9* 13.3 11.9* 10.5* 11.3*  HCT 40.7 35.0* 39.0 35.0* 31.0* 38.1  MCV 103.3*  --   --   --   --  101.9*  PLT 211  --   --   --   --  188    Basic Metabolic Panel: Recent Labs  Lab 08/06/2021 1257 08/13/2021 1311 08/10/2021 1336 08/28/2021 1440 08/10/2021 1618 08/16/2021 1737 08/09/21 0141  NA 138   < > 138 138 138 137 137  K 5.6*   < > 5.3* 5.3* 4.4 4.6 4.7  CL 95*  --  98  --   --  98 100  CO2 33*  --   --   --   --  30 25  GLUCOSE 57*  --  54*  --   --  159* 136*  BUN 49*  --  51*  --   --  49* 52*  CREATININE 2.06*  --  2.10*  --   --  1.98* 2.04*  CALCIUM 8.6*  --   --   --   --  8.0* 8.1*  MG  --   --   --   --   --  1.7 1.5*  PHOS  --   --   --   --   --  4.8* 4.4   < > = values in this interval not displayed.   GFR: Estimated Creatinine Clearance: 17.9 mL/min (A) (by C-G formula based on SCr of 2.04 mg/dL (H)). Recent Labs  Lab 08/21/2021 1257 08/17/2021 1320 08/13/2021 1737 08/10/2021 2200 08/09/21 0141  08/09/21 0423  PROCALCITON  --   --  13.68  --   --   --   WBC 24.3*  --   --   --  16.4*  --   LATICACIDVEN  --    < > 2.2* 4.0* 4.0* 3.7*   < > = values in this interval not displayed.    Liver Function Tests: Recent Labs  Lab 08/26/2021 1257  AST 85*  ALT 39  ALKPHOS 68  BILITOT 1.3*  PROT 6.3*  ALBUMIN 2.7*   No results for input(s): LIPASE, AMYLASE in the last 168 hours. No results for input(s): AMMONIA in the last 168 hours.  ABG    Component Value Date/Time   PHART 7.318 (L) 08/14/2021 1618   PCO2ART 63.6 (H) 08/24/2021 1618   PO2ART 412 (H) 08/13/2021 1618   HCO3 32.7 (H) 08/18/2021 1618   TCO2 35 (H) 08/10/2021 1618   O2SAT 100.0 08/03/2021 1618     Coagulation Profile: Recent Labs  Lab 08/07/2021 1258  INR 2.6*    Cardiac Enzymes: No results for input(s): CKTOTAL, CKMB, CKMBINDEX, TROPONINI in the last 168 hours.  HbA1C: Hgb A1c MFr Bld  Date/Time Value Ref Range Status  08/11/2021 05:37 PM 5.9 (H) 4.8 - 5.6 % Final    Comment:    (NOTE) Pre diabetes:          5.7%-6.4%  Diabetes:              >6.4%  Glycemic control for   <7.0% adults with diabetes   02/02/2020 08:18 AM 6.0 4.6 - 6.5 % Final    Comment:    Glycemic Control Guidelines for People with Diabetes:Non Diabetic:  <6%Goal of Therapy: <7%Additional  Action Suggested:  >8%     CBG: Recent Labs  Lab 08/17/2021 1659 08/25/2021 1905 08/19/2021 2302 08/09/21 0303 08/09/21 0815  GLUCAP 124* 169* 133* 128* 186*   Allergies Allergies  Allergen Reactions   Doxycycline Nausea And Vomiting     Home Medications  Prior to Admission medications   Medication Sig Start Date End Date Taking? Authorizing Provider  albuterol (VENTOLIN HFA) 108 (90 Base) MCG/ACT inhaler INHALE 1 TO 2 PUFFS EVERY 4 HOURS AS NEEDED FOR WHEEZING 07/03/21   Joaquim Namuncan, Graham S, MD  amoxicillin-clavulanate (AUGMENTIN) 875-125 MG tablet Take 1 tablet by mouth 2 (two) times daily. 07/03/21   Joaquim Namuncan, Graham S, MD  apixaban  (ELIQUIS) 5 MG TABS tablet Take 1 tablet (5 mg total) by mouth 2 (two) times daily. 07/03/21   Joaquim Namuncan, Graham S, MD  atorvastatin (LIPITOR) 20 MG tablet Take 1 tablet (20 mg total) by mouth daily. 07/03/21   Joaquim Namuncan, Graham S, MD  Cholecalciferol (VITAMIN D3) 50 MCG (2000 UT) capsule Take 1 capsule (2,000 Units total) by mouth daily. 06/26/21   Joaquim Namuncan, Graham S, MD  diltiazem (CARDIZEM CD) 120 MG 24 hr capsule TAKE 1 CAPSULE AT BEDTIME (APPOINTMENT IS NEEDED FOR REFILLS) 07/03/21   Joaquim Namuncan, Graham S, MD  docusate sodium (DOK) 100 MG capsule Take 1 capsule (100 mg total) by mouth 2 (two) times daily. 07/11/21 10/09/21  Joaquim Namuncan, Graham S, MD  esomeprazole (NEXIUM) 20 MG capsule Take 1 capsule (20 mg total) by mouth daily at 12 noon. 07/03/21   Joaquim Namuncan, Graham S, MD  ferrous sulfate (FEROSUL) 325 (65 FE) MG tablet TAKE 1 TABLET (325 MG TOTAL) BY MOUTH 2 (TWO) TIMES DAILY WITH A MEAL. 07/03/21   Joaquim Namuncan, Graham S, MD  fluticasone [facility](FLONASE) 50 MCG/ACT nasal spray Place 2 sprays into both nostrils daily as needed (seasonal allergies). 07/03/21   Joaquim Namuncan, Graham S, MD  fluticasone-salmeterol [facility](WIXELA INHUB) 250-50 MCG/ACT AEPB Inhale 1 puff into the lungs in the morning and at bedtime. 07/03/21   Joaquim Namuncan, Graham S, MD  furosemide (LASIX) 20 MG tablet Take 1 tablet (20 mg total) by mouth daily as needed for fluid (take with potassium). 07/03/21   Joaquim Namuncan, Graham S, MD  ipratropium-albuterol (DUONEB) 0.5-2.5 (3) MG/3ML SOLN Use up to 4 times a day if needed. 07/03/21   Joaquim Namuncan, Graham S, MD  metoprolol succinate (TOPROL-XL) 50 MG 24 hr tablet Take 1 tablet (50 mg total) by mouth daily. Take with or immediately following a meal. 07/03/21   Joaquim Namuncan, Graham S, MD  NON FORMULARY Oxygen 3 liters 24/7    [provider]  potassium chloride (KLOR-CON) 10 MEQ tablet TAKE 1 TABLET EVERY DAY AS NEEDED (TAKE WITH LASIX). 07/03/21   Joaquim Namuncan, Graham S, MD  predniSONE (DELTASONE) 10 MG tablet Take 2 a day for 5 days, then 1 a day for 5 days, with  food. Don't take with aleve/ibuprofen. 07/15/21   Joaquim Namuncan, Graham S, MD  traMADol (ULTRAM) 50 MG tablet Take 1-2 tablets (50-100 mg total) by mouth every 12 (twelve) hours as needed. 06/25/21   Joaquim Namuncan, Graham S, MD  traZODone (DESYREL) 50 MG tablet Take 0.5-1 tablets (25-50 mg total) by mouth at bedtime as needed for sleep. 06/25/21   Joaquim Namuncan, Graham S, MD     Critical care time:35     Bevelyn NgoSarah F. Quincy Prisco, AGACNP-BC [facility] 08/09/2021, 8:24 AM  See Loretha StaplerAmion for pager If no response to pager, please call PCCM consult pager After 7:00 pm call Elink

## 2021-08-09 NOTE — Progress Notes (Signed)
PHARMACY - PHYSICIAN COMMUNICATION CRITICAL VALUE ALERT - BLOOD CULTURE IDENTIFICATION (BCID)  Dominique Clark is an 80 y.o. female who presented to [facility] on 13-Aug-2021 with a chief complaint of SOB/PNA  Assessment:   2/2 blood cultures growing Streptococcus pneumoniae  Name of physician (or Provider) Contacted:  Dr. Arsenio Loader  Current antibiotics: Rocephin/Azithromycin  Changes to prescribed antibiotics recommended:  Consider narrowing to Rocephin   Results for orders placed or performed during the hospital encounter of Aug 13, 2021  Blood Culture ID Panel (Reflexed) (Collected: August 13, 2021  1:02 PM)  Result Value Ref Range   Enterococcus faecalis NOT DETECTED NOT DETECTED   Enterococcus Faecium NOT DETECTED NOT DETECTED   Listeria monocytogenes NOT DETECTED NOT DETECTED   Staphylococcus species NOT DETECTED NOT DETECTED   Staphylococcus aureus (BCID) NOT DETECTED NOT DETECTED   Staphylococcus epidermidis NOT DETECTED NOT DETECTED   Staphylococcus lugdunensis NOT DETECTED NOT DETECTED   Streptococcus species DETECTED (A) NOT DETECTED   Streptococcus agalactiae NOT DETECTED NOT DETECTED   Streptococcus pneumoniae DETECTED (A) NOT DETECTED   Streptococcus pyogenes NOT DETECTED NOT DETECTED   A.calcoaceticus-baumannii NOT DETECTED NOT DETECTED   Bacteroides fragilis NOT DETECTED NOT DETECTED   Enterobacterales NOT DETECTED NOT DETECTED   Enterobacter cloacae complex NOT DETECTED NOT DETECTED   Escherichia coli NOT DETECTED NOT DETECTED   Klebsiella aerogenes NOT DETECTED NOT DETECTED   Klebsiella oxytoca NOT DETECTED NOT DETECTED   Klebsiella pneumoniae NOT DETECTED NOT DETECTED   Proteus species NOT DETECTED NOT DETECTED   Salmonella species NOT DETECTED NOT DETECTED   Serratia marcescens NOT DETECTED NOT DETECTED   Haemophilus influenzae NOT DETECTED NOT DETECTED   Neisseria meningitidis NOT DETECTED NOT DETECTED   Pseudomonas aeruginosa NOT DETECTED NOT DETECTED    Stenotrophomonas maltophilia NOT DETECTED NOT DETECTED   Candida albicans NOT DETECTED NOT DETECTED   Candida auris NOT DETECTED NOT DETECTED   Candida glabrata NOT DETECTED NOT DETECTED   Candida krusei NOT DETECTED NOT DETECTED   Candida parapsilosis NOT DETECTED NOT DETECTED   Candida tropicalis NOT DETECTED NOT DETECTED   Cryptococcus neoformans/gattii NOT DETECTED NOT DETECTED    Eddie Candle 08/09/2021  3:53 AM

## 2021-08-09 NOTE — Consult Note (Signed)
   [facility] Inpatient Consult   08/09/2021  AIRA SALLADE 11-22-1941 [id number]  [facility] [[facility]]  Accountable Care Organization [ACO] Patient: [facility]  Patient reviewed for high risk scores for unplanned readmission  Primary Care Provider:  [name], MD is an Embedded provider at [facility], patient has been active with CCM Pharmacist noted in encounter.  Patient was screened for [facility] [[facility]]  Care Management services. Patient will have the transition of care call conducted by the primary care provider. This patient is also in an Embedded practice which has a chronic disease management Embedded Care Management team.  Plan: Notification can be sent to the [facility] Embedded Care Management team of any additional follow up needs.  Please contact for further questions,  Charlesetta Shanks, RN BSN CCM [facility]  [phone] business mobile phone Toll free office [phone]  Fax number: [phone] [email or web address]

## 2021-08-09 NOTE — Progress Notes (Signed)
Pt alarm going off, entered room to find pt had extubated herself.  eLink, respiratory notified, bagged pt til ground team arrived at bedside.   Freida Busman, RN .

## 2021-08-09 NOTE — Progress Notes (Addendum)
   Palliative Medicine Inpatient Follow Up Note   Chart reviewed.   I reached out to Dr. Celine Mans and Kandice Robinsons, NP this afternoon via secure chat.   Dr. Celine Mans shares with me that her colleague had entered the consult for our team and presently there are no additional needs for our involvement.  PMT will sign off though we are always available if additional support is needed.  No Charge ______________________________________________________________________________________ Dominique Clark [place] Palliative Medicine Team Team Cell Phone: [phone] Please utilize secure chat with additional questions, if there is no response within 30 minutes please call the above phone number  Palliative Medicine Team providers are available by phone from 7am to 7pm daily and can be reached through the team cell phone.  Should this patient require assistance outside of these hours, please call the patient's attending physician.

## 2021-08-09 NOTE — Progress Notes (Signed)
eLink Physician-Brief Progress Note Patient Name: JANIAH DEVINNEY DOB: 1941/02/25 MRN: [id number]   Date of Service  08/09/2021  HPI/Events of Note  Multiple issues: 1. Lactic Acid = 4.0. Hgb = 11.3. No CVL or CVP. Last LVEF = 55-60%. 2. AFIB with RVR. Ventricular rate = 130's. K+ = 4.7 and Mg++ = 1.5. Mg++ has already been replaced. Eliquis being held d/t likely need for a chest tube. Defer Heparin IV infusion vs Eliquis to PCCM rounding team. 3. Oliguria - only about 40 mL of urine for shift.   eICU Interventions  Plan: Bolus with 0.9 NaCl 1 liter IV over 1 hour now. Amiodarone IV load and infusion.      Intervention Category Major Interventions: Acid-Base disturbance - evaluation and management;Arrhythmia - evaluation and management  Alnisa Hasley [place] 08/09/2021, 4:02 AM

## 2021-08-10 ENCOUNTER — Inpatient Hospital Stay (HOSPITAL_COMMUNITY): Payer: Medicare HMO

## 2021-08-10 DIAGNOSIS — N179 Acute kidney failure, unspecified: Secondary | ICD-10-CM | POA: Diagnosis not present

## 2021-08-10 DIAGNOSIS — J90 Pleural effusion, not elsewhere classified: Secondary | ICD-10-CM | POA: Diagnosis not present

## 2021-08-10 DIAGNOSIS — J9621 Acute and chronic respiratory failure with hypoxia: Secondary | ICD-10-CM | POA: Diagnosis not present

## 2021-08-10 DIAGNOSIS — J449 Chronic obstructive pulmonary disease, unspecified: Secondary | ICD-10-CM | POA: Diagnosis not present

## 2021-08-10 LAB — CBC WITH DIFFERENTIAL/PLATELET
Abs Immature Granulocytes: 0.14 10*3/uL — ABNORMAL HIGH (ref 0.00–0.07)
Basophils Absolute: 0 10*3/uL (ref 0.0–0.1)
Basophils Relative: 0 %
Eosinophils Absolute: 0 10*3/uL (ref 0.0–0.5)
Eosinophils Relative: 0 %
HCT: 30.9 % — ABNORMAL LOW (ref 36.0–46.0)
Hemoglobin: 9.8 g/dL — ABNORMAL LOW (ref 12.0–15.0)
Immature Granulocytes: 1 %
Lymphocytes Relative: 1 %
Lymphs Abs: 0.2 10*3/uL — ABNORMAL LOW (ref 0.7–4.0)
MCH: 30.7 pg (ref 26.0–34.0)
MCHC: 31.7 g/dL (ref 30.0–36.0)
MCV: 96.9 fL (ref 80.0–100.0)
Monocytes Absolute: 0.5 10*3/uL (ref 0.1–1.0)
Monocytes Relative: 3 %
Neutro Abs: 17.8 10*3/uL — ABNORMAL HIGH (ref 1.7–7.7)
Neutrophils Relative %: 95 %
Platelets: 157 10*3/uL (ref 150–400)
RBC: 3.19 MIL/uL — ABNORMAL LOW (ref 3.87–5.11)
RDW: 13.5 % (ref 11.5–15.5)
WBC: 18.7 10*3/uL — ABNORMAL HIGH (ref 4.0–10.5)
nRBC: 0.6 % — ABNORMAL HIGH (ref 0.0–0.2)

## 2021-08-10 LAB — GLUCOSE, CAPILLARY
Glucose-Capillary: 220 mg/dL — ABNORMAL HIGH (ref 70–99)
Glucose-Capillary: 211 mg/dL — ABNORMAL HIGH (ref 70–99)
Glucose-Capillary: 188 mg/dL — ABNORMAL HIGH (ref 70–99)
Glucose-Capillary: 151 mg/dL — ABNORMAL HIGH (ref 70–99)
Glucose-Capillary: 147 mg/dL — ABNORMAL HIGH (ref 70–99)
Glucose-Capillary: 224 mg/dL — ABNORMAL HIGH (ref 70–99)

## 2021-08-10 LAB — COMPREHENSIVE METABOLIC PANEL
ALT: 94 U/L — ABNORMAL HIGH (ref 0–44)
AST: 107 U/L — ABNORMAL HIGH (ref 15–41)
Albumin: 1.8 g/dL — ABNORMAL LOW (ref 3.5–5.0)
Alkaline Phosphatase: 60 U/L (ref 38–126)
Anion gap: 10 (ref 5–15)
BUN: 66 mg/dL — ABNORMAL HIGH (ref 8–23)
CO2: 25 mmol/L (ref 22–32)
Calcium: 8.1 mg/dL — ABNORMAL LOW (ref 8.9–10.3)
Chloride: 101 mmol/L (ref 98–111)
Creatinine, Ser: 1.84 mg/dL — ABNORMAL HIGH (ref 0.44–1.00)
GFR, Estimated: 27 mL/min — ABNORMAL LOW (ref >60)
Glucose, Bld: 223 mg/dL — ABNORMAL HIGH (ref 70–99)
Potassium: 3.8 mmol/L (ref 3.5–5.1)
Sodium: 136 mmol/L (ref 135–145)
Total Bilirubin: 0.2 mg/dL — ABNORMAL LOW (ref 0.3–1.2)
Total Protein: 4.6 g/dL — ABNORMAL LOW (ref 6.5–8.1)

## 2021-08-10 LAB — URINE CULTURE: Culture: NO GROWTH

## 2021-08-10 LAB — MAGNESIUM: Magnesium: 2.5 mg/dL — ABNORMAL HIGH (ref 1.7–2.4)

## 2021-08-10 LAB — PHOSPHORUS: Phosphorus: 2.4 mg/dL — ABNORMAL LOW (ref 2.5–4.6)

## 2021-08-10 LAB — PROTIME-INR
INR: 1.5 — ABNORMAL HIGH (ref 0.8–1.2)
Prothrombin Time: 18.3 seconds — ABNORMAL HIGH (ref 11.4–15.2)

## 2021-08-10 MED ORDER — APIXABAN 2.5 MG PO TABS
2.5000 mg | ORAL_TABLET | Freq: Two times a day (BID) | ORAL | Status: DC
  Administered 2021-08-10 – 2021-08-11 (×3): 2.5 mg
  Filled 2021-08-10 (×3): qty 1

## 2021-08-10 MED ORDER — DEXMEDETOMIDINE HCL IN NACL 400 MCG/100ML IV SOLN
0.4000 ug/kg/h | INTRAVENOUS | Status: DC
  Administered 2021-08-10: 0.4 ug/kg/h via INTRAVENOUS
  Administered 2021-08-10 – 2021-08-11 (×2): 0.9 ug/kg/h via INTRAVENOUS
  Administered 2021-08-11: 0.8 ug/kg/h via INTRAVENOUS
  Filled 2021-08-10 (×4): qty 100

## 2021-08-10 MED ORDER — METOPROLOL TARTRATE 5 MG/5ML IV SOLN
5.0000 mg | Freq: Once | INTRAVENOUS | Status: AC
  Administered 2021-08-10: 5 mg via INTRAVENOUS
  Filled 2021-08-10: qty 5

## 2021-08-10 MED ORDER — METHYLPREDNISOLONE SODIUM SUCC 40 MG IJ SOLR
40.0000 mg | Freq: Every day | INTRAMUSCULAR | Status: DC
  Administered 2021-08-11: 40 mg via INTRAVENOUS
  Filled 2021-08-10: qty 1

## 2021-08-10 MED ORDER — INSULIN ASPART 100 UNIT/ML IJ SOLN
0.0000 [IU] | INTRAMUSCULAR | Status: DC
  Administered 2021-08-10: 3 [IU] via SUBCUTANEOUS
  Administered 2021-08-10: 5 [IU] via SUBCUTANEOUS
  Administered 2021-08-10: 3 [IU] via SUBCUTANEOUS
  Administered 2021-08-10: 2 [IU] via SUBCUTANEOUS
  Administered 2021-08-10: 5 [IU] via SUBCUTANEOUS
  Administered 2021-08-11: 2 [IU] via SUBCUTANEOUS
  Administered 2021-08-11 (×2): 3 [IU] via SUBCUTANEOUS

## 2021-08-10 MED ORDER — PANTOPRAZOLE SODIUM 40 MG PO PACK
40.0000 mg | PACK | Freq: Every day | ORAL | Status: DC
  Administered 2021-08-10: 40 mg
  Filled 2021-08-10 (×3): qty 20

## 2021-08-10 NOTE — Progress Notes (Signed)
NAME:  Dominique Clark, MRN:  [id number], DOB:  Feb 11, 1941, LOS: 2 ADMISSION DATE:  08/10/2021, CONSULTATION DATE:  08/04/2021 REFERRING MD:  Dr. Bernette Mayers, CHIEF COMPLAINT:  Acute respiratory failure    History of Present Illness:  Dominique Clark is a 80 y.o. female with a PMH significant for A-fib anticoagulated on Eliquis, COPD on home O2 2-3L, type 2 diabetes, TIA, and gastritis who presented to the ED with complaints of shortness of breath that started a couple days prior to admission. Daughters also reported recent history of chest pain. Daughters also report 2 rounds of antibiotics and steroids tapers since the end of June.   On ED arrival patient was seen with tachypnea, elevated BP, and hypoxia requiring NRB. Per chart review it appears patient continued to decompensated and required up titration of respiratory  support including BIPAP and eventual intubaiton. Labwork significant for K 5.6, CO2 33, BUN 49, Creat 2.06, and WBC 24.3. PCCM consulted for further management and admission.   Pertinent  Medical History  A-fib anticoagulated on Eliquis COPD (sees Dr. Para March at [facility]) Chronic hypoxic respiratory failure on 2-3L [place] Type 2 diabetes  TIA Gastritis  Significant Hospital Events:  8/11 admitted and intubated on ED arrival  8/11 self extubated and re-intubated, DNR status established, Went into a fib with RVR , now on Amio 8/12>> Blood Cx + for Strep pneumonia, on Rocephin  Interim History / Subjective:  Self extubated over night, required re-intubation. Made DNR by daughter, but wants aggressive care otherwise.  Currently on on Levo at 6, Amio at 60, and getting LR at 150/hr.  She remains tachy, a fib Strep Pneumonia per Blood Cx. Drawn 8/11 On Rocephin + 5.4  L but only 86  cc UO last 24 hours. Creatinine 1.98>> 2.04 today, BUN 49-52 Mag at 1 am was 1.5, but patient received 4 grams mag overnight WBC 24.3 on 8/11, now 16.4 ( ? How much is dilutional), HGB 11.3, platelets are 188,  T Max 98. 2 She is receiving prn fentanyl as she drops her pressures with sedation Echo is pending, HS Trop is 48 ( was 46 on admission) Need thoracentesis 8/12, had to wait for wash out of anticoagulant Feet are mottled with pulses that are difficult to doppler L>R Current Vent settings noted below CXR over night with mild interstitial edema  Objective   Blood pressure 130/62, pulse (!) 144, temperature 97.7 F (36.5 C), temperature source Tympanic, resp. rate (!) 25, height 5\' 3"  (1.6 m), weight 55.1 kg, SpO2 100 %.    Vent Mode: PSV;CPAP FiO2 (%):  [35 %-40 %] 35 % Set Rate:  [20 bmp] 20 bmp Vt Set:  [410 mL] 410 mL PEEP:  [5 cmH20] 5 cmH20 Pressure Support:  [10 cmH20] 10 cmH20 Plateau Pressure:  [15 cmH20-21 cmH20] 17 cmH20   Intake/Output Summary (Last 24 hours) at 08/10/2021 0933 Last data filed at 08/10/2021 0800 Gross per 24 hour  Intake 2156.75 ml  Output 385 ml  Net 1771.75 ml   Filed Weights   07/29/2021 1645 08/09/21 0500 08/10/21 0500  Weight: 51.9 kg 52.5 kg 55.1 kg    Examination: General: Thin, catechetic chronically ill appearing elderly female on mechanical ventilation, in NAD HEENT: ETT at 23 at lip, MM pink/moist, PERRL, pupils 3/= reactive, No LAD, No JVD Neuro: No sedation, arouses easily gets agitated, MAE x 4, not following commands CV: irir, cool extremities, barely palpable peripheral pulses, sluggish cap refill PULM:  Bilateral chest excursion, MV  supported breaths, Coarse throughout GI: soft, bs +, non-distended Extremities: cool/dry, +1 LE edema , mottling to feet and toes bilaterally   R lateral pleural space  Resolved Hospital Problem list   Hyperkalemia  Assessment & Plan:   Septic Shock  Streptococcus Pneumoniae bacteremia and pneumonia - continue ceftriaxone - adequately fluid resuscitated - titrated down vasopressor suppot for goal MAP>65 - started on neosynephrine overnight.   Acute on chronic hypoxic/ hypercarbic respiratory  failure- on home O2 2-3 [place] CAP- with failed outpt treatment AECOPD Right pleural effusion P:  LTVV - VAP bundle - daily SAT/SBT - small effusion evaluted by bedside ultrasound, defer thoracentesis at this time.  - solumedrol 40mg  taper to daily - duonebs q4, pulmicort and brovanna nebs  -adding precedex for sedation to help keep her comfortable. Will try SBT today. Consider one-way extubation after talking with the family in the next 1-2 days.   AKI Worsening creatinine and minimal UO last 24 hours NAGMA - likely sepsis related ATN - Cr plateaued - monitor UOP and hemodyanmic support as above - Trend BMP / urinary output - Replace electrolytes as indicated - Avoid nephrotoxic agents, ensure adequate renal perfusion - renal u/s reviewed and shows medical renal disease.   Afib Converted to A fib with RVR overnight with re-intubation - Continue Amiodarone  - tele monitoring, currently rate controlled even on NE - HAS- BLED score, high at least 5 with bleeding rate 12.5%/ yr, hold eliquis/ heparin bridge - held eliquis 8/12 due to elevated INR. Recheck ordered for today  DMT2 - hypoglycemic in ER, resolved , now on TF - CBG q 4  with SSI  Mild Transaminitis - supportive care - repeat LFTs in am, improving, likely secondary to septic shock  Protein- calorie malnutrition TF started 8/11, at 40 cc per hour ? Failure to thrive  - Dietary consult/ start TF>> appreciate assist  Mottled Lower Extremities Plan Doppler pulses Q 4 with assessment Likely in the setting of shock and poor peripheral circulation   Best Practice (right click and "Reselect all SmartList Selections" daily)   Diet/type: Tube Feeds DVT prophylaxis: SCD GI prophylaxis: PPI Lines: N/A Foley:  Yes, and it is still needed Code Status:  DNR Last date of multidisciplinary goals of care discussion [talked with daughter and granddaughter at bedside 8/12 about time limited trial of life support. No plans  for tracheostomy]  Labs   CBC: Recent Labs  Lab 26-Aug-2021 1257 26-Aug-2021 1311 08/26/2021 1336 08/26/2021 1440 08/26/21 1618 08/09/21 0141 08/10/21 0544  WBC 24.3*  --   --   --   --  16.4* 18.7*  NEUTROABS 21.5*  --   --   --   --   --  17.8*  HGB 12.1   < > 13.3 11.9* 10.5* 11.3* 9.8*  HCT 40.7   < > 39.0 35.0* 31.0* 38.1 30.9*  MCV 103.3*  --   --   --   --  101.9* 96.9  PLT 211  --   --   --   --  188 157   < > = values in this interval not displayed.    Basic Metabolic Panel: Recent Labs  Lab 08-26-21 1257 08/26/21 1311 08/26/2021 1336 08-26-2021 1440 2021-08-26 1618 08/26/21 1737 08/09/21 0141 08/09/21 1854 08/10/21 0544  NA 138   < > 138 138 138 137 137  --  136  K 5.6*   < > 5.3* 5.3* 4.4 4.6 4.7  --  3.8  CL  95*  --  98  --   --  98 100  --  101  CO2 33*  --   --   --   --  30 25  --  25  GLUCOSE 57*  --  54*  --   --  159* 136*  --  223*  BUN 49*  --  51*  --   --  49* 52*  --  66*  CREATININE 2.06*  --  2.10*  --   --  1.98* 2.04*  --  1.84*  CALCIUM 8.6*  --   --   --   --  8.0* 8.1*  --  8.1*  MG  --   --   --   --   --  1.7 1.5* 2.6* 2.5*  PHOS  --   --   --   --   --  4.8* 4.4 2.9 2.4*   < > = values in this interval not displayed.   GFR: Estimated Creatinine Clearance: 19.8 mL/min (A) (by C-G formula based on SCr of 1.84 mg/dL (H)). Recent Labs  Lab 03/23/2021 1257 03/23/2021 1320 03/23/2021 1737 03/23/2021 2200 08/09/21 0141 08/09/21 0423 08/10/21 0544  PROCALCITON  --   --  13.68  --   --   --   --   WBC 24.3*  --   --   --  16.4*  --  18.7*  LATICACIDVEN  --    < > 2.2* 4.0* 4.0* 3.7*  --    < > = values in this interval not displayed.    Liver Function Tests: Recent Labs  Lab 03/23/2021 1257 08/09/21 0141 08/10/21 0544  AST 85* 375* 107*  ALT 39 140* 94*  ALKPHOS 68 56 60  BILITOT 1.3* 1.1 0.2*  PROT 6.3* 5.6* 4.6*  ALBUMIN 2.7* 2.2* 1.8*   No results for input(s): LIPASE, AMYLASE in the last 168 hours. No results for input(s): AMMONIA in the  last 168 hours.  ABG    Component Value Date/Time   PHART 7.318 (L) 10-15-21 1618   PCO2ART 63.6 (H) 10-15-21 1618   PO2ART 412 (H) 10-15-21 1618   HCO3 32.7 (H) 10-15-21 1618   TCO2 35 (H) 10-15-21 1618   O2SAT 100.0 10-15-21 1618     Coagulation Profile: Recent Labs  Lab 03/23/2021 1258  INR 2.6*    Cardiac Enzymes: No results for input(s): CKTOTAL, CKMB, CKMBINDEX, TROPONINI in the last 168 hours.  HbA1C: Hgb A1c MFr Bld  Date/Time Value Ref Range Status  10-15-21 05:37 PM 5.9 (H) 4.8 - 5.6 % Final    Comment:    (NOTE) Pre diabetes:          5.7%-6.4%  Diabetes:              >6.4%  Glycemic control for   <7.0% adults with diabetes   02/02/2020 08:18 AM 6.0 4.6 - 6.5 % Final    Comment:    Glycemic Control Guidelines for People with Diabetes:Non Diabetic:  <6%Goal of Therapy: <7%Additional Action Suggested:  >8%     CBG: Recent Labs  Lab 08/09/21 1505 08/09/21 1906 08/09/21 2304 08/10/21 0305 08/10/21 0713  GLUCAP 173* 200* 216* 220* 211*   The patient is critically ill due to respiratory failure, septic shock.  Critical care was necessary to treat or prevent imminent or life-threatening deterioration.  Critical care was time spent personally by me on the following activities: development of treatment plan with patient and/or surrogate  as well as nursing, discussions with consultants, evaluation of patient's response to treatment, examination of patient, obtaining history from patient or surrogate, ordering and performing treatments and interventions, ordering and review of laboratory studies, ordering and review of radiographic studies, pulse oximetry, re-evaluation of patient's condition and participation in multidisciplinary rounds.   Critical Care Time devoted to patient care services described in this note is 34 minutes. This time reflects time of care of this signee Charlott Holler . This critical care time does not reflect separately billable  procedures or procedure time, teaching time or supervisory time of PA/NP/Med student/Med Resident etc but could involve care discussion time.       Charlott Holler  Pulmonary and Critical Care Medicine 08/10/2021 9:33 AM  Pager: see AMION  If no response to pager , please call critical care on call (see AMION) until 7pm After 7:00 pm call Elink

## 2021-08-11 DIAGNOSIS — Z66 Do not resuscitate: Secondary | ICD-10-CM

## 2021-08-11 DIAGNOSIS — N179 Acute kidney failure, unspecified: Secondary | ICD-10-CM | POA: Diagnosis not present

## 2021-08-11 DIAGNOSIS — I4891 Unspecified atrial fibrillation: Secondary | ICD-10-CM | POA: Diagnosis not present

## 2021-08-11 DIAGNOSIS — J9621 Acute and chronic respiratory failure with hypoxia: Secondary | ICD-10-CM | POA: Diagnosis not present

## 2021-08-11 DIAGNOSIS — J449 Chronic obstructive pulmonary disease, unspecified: Secondary | ICD-10-CM | POA: Diagnosis not present

## 2021-08-11 DIAGNOSIS — Z7189 Other specified counseling: Secondary | ICD-10-CM

## 2021-08-11 LAB — CBC
HCT: 30.9 % — ABNORMAL LOW (ref 36.0–46.0)
Hemoglobin: 9.6 g/dL — ABNORMAL LOW (ref 12.0–15.0)
MCH: 29.7 pg (ref 26.0–34.0)
MCHC: 31.1 g/dL (ref 30.0–36.0)
MCV: 95.7 fL (ref 80.0–100.0)
Platelets: 146 10*3/uL — ABNORMAL LOW (ref 150–400)
RBC: 3.23 MIL/uL — ABNORMAL LOW (ref 3.87–5.11)
RDW: 13.8 % (ref 11.5–15.5)
WBC: 17.5 10*3/uL — ABNORMAL HIGH (ref 4.0–10.5)
nRBC: 0.6 % — ABNORMAL HIGH (ref 0.0–0.2)

## 2021-08-11 LAB — COMPREHENSIVE METABOLIC PANEL
ALT: 88 U/L — ABNORMAL HIGH (ref 0–44)
AST: 87 U/L — ABNORMAL HIGH (ref 15–41)
Albumin: 1.7 g/dL — ABNORMAL LOW (ref 3.5–5.0)
Alkaline Phosphatase: 69 U/L (ref 38–126)
Anion gap: 9 (ref 5–15)
BUN: 74 mg/dL — ABNORMAL HIGH (ref 8–23)
CO2: 26 mmol/L (ref 22–32)
Calcium: 8.1 mg/dL — ABNORMAL LOW (ref 8.9–10.3)
Chloride: 102 mmol/L (ref 98–111)
Creatinine, Ser: 1.92 mg/dL — ABNORMAL HIGH (ref 0.44–1.00)
GFR, Estimated: 26 mL/min — ABNORMAL LOW (ref >60)
Glucose, Bld: 205 mg/dL — ABNORMAL HIGH (ref 70–99)
Potassium: 4.1 mmol/L (ref 3.5–5.1)
Sodium: 137 mmol/L (ref 135–145)
Total Bilirubin: 0.6 mg/dL (ref 0.3–1.2)
Total Protein: 4.9 g/dL — ABNORMAL LOW (ref 6.5–8.1)

## 2021-08-11 LAB — CULTURE, RESPIRATORY W GRAM STAIN

## 2021-08-11 LAB — CULTURE, BLOOD (ROUTINE X 2)
Special Requests: ADEQUATE
Special Requests: ADEQUATE

## 2021-08-11 LAB — GLUCOSE, CAPILLARY
Glucose-Capillary: 186 mg/dL — ABNORMAL HIGH (ref 70–99)
Glucose-Capillary: 155 mg/dL — ABNORMAL HIGH (ref 70–99)
Glucose-Capillary: 142 mg/dL — ABNORMAL HIGH (ref 70–99)
Glucose-Capillary: 140 mg/dL — ABNORMAL HIGH (ref 70–99)

## 2021-08-11 LAB — PHOSPHORUS: Phosphorus: 2.2 mg/dL — ABNORMAL LOW (ref 2.5–4.6)

## 2021-08-11 MED ORDER — DEXTROSE 5 % IV SOLN: INTRAVENOUS | Status: DC

## 2021-08-11 MED ORDER — GLYCOPYRROLATE 1 MG PO TABS: 1.0000 mg | ORAL_TABLET | ORAL | Status: DC | PRN

## 2021-08-11 MED ORDER — MORPHINE SULFATE (PF) 2 MG/ML IV SOLN
2.0000 mg | INTRAVENOUS | Status: DC | PRN
  Administered 2021-08-11 (×3): 2 mg via INTRAVENOUS
  Administered 2021-08-12: 4 mg via INTRAVENOUS
  Administered 2021-08-12 (×2): 2 mg via INTRAVENOUS
  Filled 2021-08-11: qty 1
  Filled 2021-08-11: qty 2
  Filled 2021-08-11 (×4): qty 1

## 2021-08-11 MED ORDER — SODIUM CHLORIDE 0.9 % IV SOLN
1.0000 g | INTRAVENOUS | Status: DC
  Administered 2021-08-11: 1 g via INTRAVENOUS
  Filled 2021-08-11: qty 1

## 2021-08-11 MED ORDER — ACETAMINOPHEN 650 MG RE SUPP: 650.0000 mg | Freq: Four times a day (QID) | RECTAL | Status: DC | PRN

## 2021-08-11 MED ORDER — INSULIN GLARGINE-YFGN 100 UNIT/ML ~~LOC~~ SOLN
5.0000 [IU] | Freq: Every day | SUBCUTANEOUS | Status: DC
  Administered 2021-08-11: 5 [IU] via SUBCUTANEOUS
  Filled 2021-08-11: qty 0.05

## 2021-08-11 MED ORDER — GLYCOPYRROLATE 0.2 MG/ML IJ SOLN: 0.2000 mg | INTRAMUSCULAR | Status: DC | PRN

## 2021-08-11 MED ORDER — DIPHENHYDRAMINE HCL 50 MG/ML IJ SOLN: 25.0000 mg | INTRAMUSCULAR | Status: DC | PRN

## 2021-08-11 MED ORDER — ACETAMINOPHEN 325 MG PO TABS: 650.0000 mg | ORAL_TABLET | Freq: Four times a day (QID) | ORAL | Status: DC | PRN

## 2021-08-11 MED ORDER — GLYCOPYRROLATE 0.2 MG/ML IJ SOLN
0.2000 mg | INTRAMUSCULAR | Status: DC | PRN
  Administered 2021-08-12 (×2): 0.2 mg via INTRAVENOUS
  Filled 2021-08-11 (×2): qty 1

## 2021-08-11 MED ORDER — POLYVINYL ALCOHOL 1.4 % OP SOLN
1.0000 [drp] | Freq: Four times a day (QID) | OPHTHALMIC | Status: DC | PRN
  Filled 2021-08-11: qty 15

## 2021-08-11 NOTE — Progress Notes (Signed)
Respiratory therapy note- Patient was extubated to 7 L [place], tolerating well at this time, family at the bedside.

## 2021-08-11 NOTE — Progress Notes (Signed)
   08/11/21 1400  Clinical Encounter Type  Visited With Patient and family together  Visit Type Initial;Spiritual support  Referral From Nurse  Consult/Referral To Chaplain  Spiritual Encounters  Spiritual Needs Prayer;Emotional  Stress Factors  Patient Stress Factors Health changes;Loss of control;Major life changes  Family Stress Factors Exhausted;Family relationships;Loss;Health changes;Major life changes;Loss of control  Chaplain was called to bedside of patient by medical team.  EOL discussion at bedside with patient and family. RN introduced Chaplain to family present:  two children, twin siblings brother and sister.  Dominique Clark was alert and talking after being extubated.  She appeared at first to have some physical distress and when Chaplain spoke to her that her children were here at her side she responded positively.  Both adult children came forward to express their emotions and love to the mother at this time. They expressed their love and request for forgiveness and giving their mother forgiveness as well.  Dominique Clark expressed her efforts for reconciliation as well.  Children requested prayer and we all surrounded Dominique Clark's bed and held hands and prayed.   No existential distress was exhibited by patient or family.   Chaplain ended visit with a departing blessing.     Respectfully submitted,   Rev. Amaryllis Dyke, M. Div., BCCC, BCPC, CFC  Resepctfully submitted,

## 2021-08-11 NOTE — Progress Notes (Signed)
NAME:  Dominique Clark, MRN:  [id number], DOB:  26-Jan-1941, LOS: 3 ADMISSION DATE:  2021/09/07, CONSULTATION DATE:  09-07-2021 REFERRING MD:  Dr. Bernette Mayers, CHIEF COMPLAINT:  Acute respiratory failure    History of Present Illness:  Dominique Clark is a 80 y.o. female with a PMH significant for A-fib anticoagulated on Eliquis, COPD on home O2 2-3L, type 2 diabetes, TIA, and gastritis who presented to the ED with complaints of shortness of breath that started a couple days prior to admission. Daughters also reported recent history of chest pain. Daughters also report 2 rounds of antibiotics and steroids tapers since the end of June.   On ED arrival patient was seen with tachypnea, elevated BP, and hypoxia requiring NRB. Per chart review it appears patient continued to decompensated and required up titration of respiratory  support including BIPAP and eventual intubaiton. Labwork significant for K 5.6, CO2 33, BUN 49, Creat 2.06, and WBC 24.3. PCCM consulted for further management and admission.   Pertinent  Medical History  A-fib anticoagulated on Eliquis COPD (sees Dr. Para March at [facility]) Chronic hypoxic respiratory failure on 2-3L [place] Type 2 diabetes  TIA Gastritis  Significant Hospital Events:  8/11 admitted and intubated on ED arrival  8/11 self extubated and re-intubated, DNR status established, Went into a fib with RVR , now on Amio 8/12>> Blood Cx + for Strep pneumonia, on Rocephin  Interim History / Subjective:  This morning awake, alert on precedex, follows commands.  Would like breathing tube out. Does not want it put back in.   Objective   Blood pressure 120/68, pulse (!) 133, temperature 97.6 F (36.4 C), temperature source Axillary, resp. rate (!) 22, height 5\' 3"  (1.6 m), weight 56.2 kg, SpO2 96 %.    Vent Mode: PRVC FiO2 (%):  [35 %] 35 % Set Rate:  [20 bmp] 20 bmp Vt Set:  [410 mL] 410 mL PEEP:  [5 cmH20] 5 cmH20 Pressure Support:  [12 cmH20] 12 cmH20 Plateau Pressure:  [18  cmH20] 18 cmH20   Intake/Output Summary (Last 24 hours) at 08/11/2021 0925 Last data filed at 08/11/2021 0901 Gross per 24 hour  Intake 2256.37 ml  Output 965 ml  Net 1291.37 ml   Filed Weights   08/09/21 0500 08/10/21 0500 08/11/21 0500  Weight: 52.5 kg 55.1 kg 56.2 kg    Examination: General: thin, cachectic, chronically ill appearing HEENT: ETT to vent Neuro: awake alert, moves all 4 extremities and follows commands CV: irregularly irregular, tachycardic in the 130s PULM:  diminished bilaterally, coarse, tachypnic GI: soft  CBG 155 Cr 1.92 Na137 K 4.1   Resolved Hospital Problem list   Hyperkalemia  Assessment & Plan:   Septic Shock  Streptococcus Pneumoniae bacteremia and pneumonia - continue ceftriaxone - adequately fluid resuscitated - titrated down vasopressor suppot for goal MAP>65 - maintain vasopressors as needed  Acute on chronic hypoxic/ hypercarbic respiratory failure- on home O2 2-3 [place] CAP- with failed outpt treatment AECOPD Right pleural effusion P:  LTVV - VAP bundle - daily SAT/SBT - small effusion evaluted by bedside ultrasound, defer thoracentesis at this time.  - solumedrol 40mg  daily - duonebs q4, pulmicort and brovanna nebs  - continue precedex. Patient does not want tracheostomy and would like breathing tube out.  AKI Worsening creatinine and minimal UO last 24 hours NAGMA - likely sepsis related ATN - Cr plateaued - monitor UOP and hemodyanmic support as above - Trend BMP / urinary output - Replace electrolytes as indicated -  Avoid nephrotoxic agents, ensure adequate renal perfusion - renal u/s reviewed and shows medical renal disease.   Afib Converted to A fib with RVR overnight with re-intubation - Continue Amiodarone  - tele monitoring, currently rate controlled even on NE - HAS- BLED score, high at least 5 with bleeding rate 12.5%/ yr, hold eliquis/ heparin bridge - held eliquis 8/12 due to elevated INR. Recheck ordered  for today  DMT2 - hypoglycemic in ER, resolved , now on TF - CBG q 4  with SSI  Mild Transaminitis - supportive care - repeat LFTs in am, improving, likely secondary to septic shock  Protein- calorie malnutrition TF started 8/11, at 40 cc per hour ? Failure to thrive  - Dietary consult/ start TF>> appreciate assist  Mottled Lower Extremities Plan Doppler pulses Q 4 with assessment Likely in the setting of shock and poor peripheral circulation   Best Practice (right click and "Reselect all SmartList Selections" daily)   Diet/type: Tube Feeds DVT prophylaxis: SCD GI prophylaxis: PPI Lines: N/A Foley:  Yes, and it is still needed Code Status:  DNR Last date of multidisciplinary goals of care discussion: 8/14 talked with patient and daughter over the phone. Based on patient's baseline functional status and goals of care - she does not want prolonged mechanical ventilation or tracheostomy. Wants breathing tube out. Does not want it back in. Will plan for one way extubation today at noon. Will continue precedex, and start prn morphine for air hunger. If she does not do well with extubation will transition to comfort measures.   Labs   CBC: Recent Labs  Lab May 27, 2021 1257 May 27, 2021 1311 May 27, 2021 1440 May 27, 2021 1618 08/09/21 0141 08/10/21 0544 08/11/21 0103  WBC 24.3*  --   --   --  16.4* 18.7* 17.5*  NEUTROABS 21.5*  --   --   --   --  17.8*  --   HGB 12.1   < > 11.9* 10.5* 11.3* 9.8* 9.6*  HCT 40.7   < > 35.0* 31.0* 38.1 30.9* 30.9*  MCV 103.3*  --   --   --  101.9* 96.9 95.7  PLT 211  --   --   --  188 157 146*   < > = values in this interval not displayed.    Basic Metabolic Panel: Recent Labs  Lab May 27, 2021 1257 May 27, 2021 1311 May 27, 2021 1336 May 27, 2021 1440 May 27, 2021 1618 May 27, 2021 1737 08/09/21 0141 08/09/21 1854 08/10/21 0544 08/11/21 0103  NA 138   < > 138   < > 138 137 137  --  136 137  K 5.6*   < > 5.3*   < > 4.4 4.6 4.7  --  3.8 4.1  CL 95*  --  98  --   --   98 100  --  101 102  CO2 33*  --   --   --   --  30 25  --  25 26  GLUCOSE 57*  --  54*  --   --  159* 136*  --  223* 205*  BUN 49*  --  51*  --   --  49* 52*  --  66* 74*  CREATININE 2.06*  --  2.10*  --   --  1.98* 2.04*  --  1.84* 1.92*  CALCIUM 8.6*  --   --   --   --  8.0* 8.1*  --  8.1* 8.1*  MG  --   --   --   --   --  1.7 1.5* 2.6* 2.5*  --   PHOS  --   --   --   --   --  4.8* 4.4 2.9 2.4* 2.2*   < > = values in this interval not displayed.   GFR: Estimated Creatinine Clearance: 19 mL/min (A) (by C-G formula based on SCr of 1.92 mg/dL (H)). Recent Labs  Lab 08/06/2021 1257 08/09/2021 1320 08/27/2021 1737 08/07/2021 2200 08/09/21 0141 08/09/21 0423 08/10/21 0544 08/11/21 0103  PROCALCITON  --   --  13.68  --   --   --   --   --   WBC 24.3*  --   --   --  16.4*  --  18.7* 17.5*  LATICACIDVEN  --    < > 2.2* 4.0* 4.0* 3.7*  --   --    < > = values in this interval not displayed.    Liver Function Tests: Recent Labs  Lab 08/11/2021 1257 08/09/21 0141 08/10/21 0544 08/11/21 0103  AST 85* 375* 107* 87*  ALT 39 140* 94* 88*  ALKPHOS 68 56 60 69  BILITOT 1.3* 1.1 0.2* 0.6  PROT 6.3* 5.6* 4.6* 4.9*  ALBUMIN 2.7* 2.2* 1.8* 1.7*   No results for input(s): LIPASE, AMYLASE in the last 168 hours. No results for input(s): AMMONIA in the last 168 hours.  ABG    Component Value Date/Time   PHART 7.318 (L) 08/01/2021 1618   PCO2ART 63.6 (H) 08/16/2021 1618   PO2ART 412 (H) 08/09/2021 1618   HCO3 32.7 (H) 08/04/2021 1618   TCO2 35 (H) 08/16/2021 1618   O2SAT 100.0 08/11/2021 1618     Coagulation Profile: Recent Labs  Lab 08/09/2021 1258 08/10/21 0950  INR 2.6* 1.5*    Cardiac Enzymes: No results for input(s): CKTOTAL, CKMB, CKMBINDEX, TROPONINI in the last 168 hours.  HbA1C: Hgb A1c MFr Bld  Date/Time Value Ref Range Status  08/25/2021 05:37 PM 5.9 (H) 4.8 - 5.6 % Final    Comment:    (NOTE) Pre diabetes:          5.7%-6.4%  Diabetes:              >6.4%  Glycemic  control for   <7.0% adults with diabetes   02/02/2020 08:18 AM 6.0 4.6 - 6.5 % Final    Comment:    Glycemic Control Guidelines for People with Diabetes:Non Diabetic:  <6%Goal of Therapy: <7%Additional Action Suggested:  >8%     CBG: Recent Labs  Lab 08/10/21 1513 08/10/21 1955 08/10/21 2310 08/11/21 0313 08/11/21 0706  GLUCAP 151* 147* 224* 186* 155*   The patient is critically ill due to respiratory failure, septic shock.  Critical care was necessary to treat or prevent imminent or life-threatening deterioration.  Critical care was time spent personally by me on the following activities: development of treatment plan with patient and/or surrogate as well as nursing, discussions with consultants, evaluation of patient's response to treatment, examination of patient, obtaining history from patient or surrogate, ordering and performing treatments and interventions, ordering and review of laboratory studies, ordering and review of radiographic studies, pulse oximetry, re-evaluation of patient's condition and participation in multidisciplinary rounds.   Critical Care Time devoted to patient care services described in this note is 36 minutes. This time reflects time of care of this signee Charlott Holler . This critical care time does not reflect separately billable procedures or procedure time, teaching time or supervisory time of PA/NP/Med student/Med Resident etc but could involve care discussion  time.       Mickel Baas Pulmonary and Critical Care Medicine 08/11/2021 9:25 AM  Pager: see AMION  If no response to pager , please call critical care on call (see AMION) until 7pm After 7:00 pm call Elink

## 2021-08-12 DIAGNOSIS — E43 Unspecified severe protein-calorie malnutrition: Secondary | ICD-10-CM | POA: Insufficient documentation

## 2021-08-12 DIAGNOSIS — L8990 Pressure ulcer of unspecified site, unspecified stage: Secondary | ICD-10-CM | POA: Insufficient documentation

## 2021-08-12 MED ORDER — MORPHINE 100MG IN NS 100ML (1MG/ML) PREMIX INFUSION
2.0000 mg/h | INTRAVENOUS | Status: DC
Start: 2021-08-12 — End: 2021-08-12
  Administered 2021-08-12: 2 mg/h via INTRAVENOUS
  Filled 2021-08-12: qty 100

## 2021-08-13 ENCOUNTER — Telehealth: Payer: Self-pay | Admitting: Family Medicine

## 2021-08-13 NOTE — Telephone Encounter (Signed)
Called family to given condolences, she thanked me for the call.  I was always glad to see this wonderful lady here in clinic.  She had a great sense of humor and we will miss seeing her.

## 2021-08-26 ENCOUNTER — Telehealth: Payer: Medicare HMO

## 2021-08-29 NOTE — Progress Notes (Signed)
Nutrition Brief Note  Chart reviewed. Pt now transitioning to comfort care.  No further nutrition interventions planned at this time.  Please re-consult as needed.   Tatisha Cerino W, RD, LDN, CDCES Registered Dietitian II Certified Diabetes Care and Education Specialist Please refer to AMION for RD and/or RD on-call/weekend/after hours pager   

## 2021-08-29 NOTE — Death Summary Note (Signed)
DEATH SUMMARY   Patient Details  Name: Dominique Clark MRN: [id number] DOB: Dec 24, 1941  Admission/Discharge Information   Admit Date:  08/26/21  Date of Death: Date of Death: 2021/08/30  Time of Death: Time of Death: 1024/05/18  Length of Stay: 4  Referring Physician: [name], MD   Reason(s) for Hospitalization  Septic Shock  Streptococcus Pneumoniae bacteremia and pneumonia Acute on chronic hypoxic/ hypercarbic respiratory failure- on home O2 2-3 [place] CAP- with failed outpt treatment AECOPD Right pleural effusion AKI NAGMA Paroxysmal atrial fibrillation with rapid ventricular response Protein calorie malnutrition Diagnoses  Preliminary cause of death:   Septic shock due to pneumococcal bacteremia and pneumonia Comfort care Secondary Diagnoses (including complications and co-morbidities):  Active Problems:   Acute on chronic respiratory failure with hypoxia and hypercapnia (HCC)   Pneumonia   AKI (acute kidney injury) (HCC)   Protein-calorie malnutrition, severe   Pressure injury of skin   Brief Hospital Course (including significant findings, care, treatment, and services provided and events leading to death)  Dominique Clark is a 80 y.o. year old female who  with a PMH significant for A-fib anticoagulated on Eliquis, COPD on home O2 2-3L, type 2 diabetes, TIA, and gastritis who presented to the ED with complaints of shortness of breath that started a couple days prior to admission. Initially patient required nonrebreather facemask to help with oxygenation and hypoxia but her condition got worse, requiring BiPAP therapy and eventual intubation.  Her blood cultures grew strep pneumo and her sputum culture grew strep pneumo and Pseudomonas.  Patient was started on appropriate antibiotic therapy she was in septic shock requiring IV vasopressors.  Considering patient was suffering with frequent admissions with acute on chronic respiratory failure due to end-stage COPD and now with  septic shock, family decided to proceed with one-way extubation and keeping her comfortable.  Patient was palliatively extubated on 8/14, was a started on comfort care, she was DNR/DNI.  Palliative care was following, she passed on 08/31/23 at 10:25 AM.  Patient's family was notified    Pertinent Labs and Studies  Significant Diagnostic Studies DG Abd 1 View  Result Date: 08-26-2021 CLINICAL DATA:  OG tube placement EXAM: ABDOMEN - 1 VIEW COMPARISON:  None. FINDINGS: OG tube coils in the stomach. Small right pleural effusion. Right basilar airspace disease. IMPRESSION: NG tube coils in the stomach. Electronically Signed   By: Charlett Nose M.D.   On: 08-26-21 18:24   US RENAL  Result Date: 08/09/2021 CLINICAL DATA:  Acute kidney injury EXAM: RENAL / URINARY TRACT ULTRASOUND COMPLETE COMPARISON:  Ultrasound 12/01/2016 FINDINGS: Right Kidney: Renal measurements: 7.3 x 3.9 x 3.9 cm = volume: 87.8 mL. Cortex appears slightly echogenic. No mass or hydronephrosis Left Kidney: Renal measurements: 68.5 x 4.6 x 4.2 cm = volume: 84.3 mL. Echogenicity within normal limits. No mass or hydronephrosis visualized. Bladder: Not visualized likely empty. Other: None. IMPRESSION: Slightly echogenic right kidney suggesting medical renal disease. No hydronephrosis Electronically Signed   By: Jasmine Pang M.D.   On: 08/09/2021 18:00   DG CHEST PORT 1 VIEW  Result Date: 08/10/2021 CLINICAL DATA:  Respiratory failure EXAM: PORTABLE CHEST 1 VIEW COMPARISON:  August 09, 2021 FINDINGS: The ETT terminates in good position. The NG tube terminates below today's film. No pneumothorax. Bilateral pleural effusions are identified, stable on the right and larger on the left in the interval. Interstitial prominence, right greater than left, is mildly improved on the left in the interval. No other interval  changes. IMPRESSION: 1. Support apparatus as above. 2. Asymmetric interstitial prominence, stable on the right and improved on the  left in the interval is likely due to asymmetric edema. 3. Stable right pleural effusion. A left pleural effusion is now seen as well. Electronically Signed   By: Gerome Sam III M.D.   On: 08/10/2021 13:00   DG Chest Port 1 View  Result Date: 08/09/2021 CLINICAL DATA:  Endotracheal tube EXAM: PORTABLE CHEST 1 VIEW COMPARISON:  08/10/2021 FINDINGS: Endotracheal tube 1.7 cm from carina. NG tube extends the stomach. Chronic bronchitic changes in the lungs. Mild interstitial edema pattern. RIGHT basilar atelectasis. IMPRESSION: 1. Stable support apparatus. 2. Mild interstitial edema pattern. 3. RIGHT basilar atelectasis Electronically Signed   By: Genevive Bi M.D.   On: 08/09/2021 08:40   Portable Chest x-ray  Result Date: Aug 10, 2021 CLINICAL DATA:  ET tube, OG tube placement EXAM: PORTABLE CHEST 1 VIEW COMPARISON:  08-10-2021 FINDINGS: Endotracheal tube is in the lower trachea, unchanged. OG tube is in place with the tip at the GE junction. Small right pleural effusion. Right lower lobe atelectasis or infiltrate. Left lung clear. Heart is normal size. Underlying COPD. Aortic atherosclerosis. IMPRESSION: Interval placement of OG tube with the tip at the GE junction. COPD. Small right effusion with right base atelectasis or infiltrate. Electronically Signed   By: Charlett Nose M.D.   On: 08-10-2021 22:05   DG Chest Portable 1 View  Result Date: Aug 10, 2021 CLINICAL DATA:  Post intubation, respiratory distress EXAM: PORTABLE CHEST 1 VIEW COMPARISON:  Chest radiograph obtained earlier the same day FINDINGS: The patient has been intubated. The endotracheal tube tip is in the midthoracic trachea approximately 2.8 cm from the carina. A defibrillator pad overlies the left chest. The cardiomediastinal silhouette is stable. Again seen are right larger than left pleural effusions with adjacent airspace disease. Aeration is overall not significantly changed. There is no pneumothorax. The bones are stable  IMPRESSION: 1. Endotracheal tube in appropriate position. 2. Stable right larger than left pleural effusions with adjacent airspace disease; no significant interval change in lung aeration. Electronically Signed   By: Lesia Hausen MD   On: Aug 10, 2021 15:53   DG Chest Portable 1 View  Result Date: 2021/08/10 CLINICAL DATA:  Short of breath 06/15/2018 EXAM: PORTABLE CHEST 1 VIEW COMPARISON:  None. FINDINGS: Heart size normal.  Negative for heart failure. Small right effusion and right lower lobe airspace disease. Possible mass density overlying the right hilum versus prominent vessel. Left lung clear. Large hiatal hernia. IMPRESSION: Right pleural effusion right lower lobe consolidation. Possible right hilar mass versus prominent vessel. Recommend CT chest, preferably with contrast, for further evaluation. Electronically Signed   By: Marlan Palau M.D.   On: 10-Aug-2021 13:04   DG Abd Portable 1V  Result Date: 10-Aug-2021 CLINICAL DATA:  OG tube placement EXAM: PORTABLE ABDOMEN - 1 VIEW COMPARISON:  Aug 10, 2021 FINDINGS: Transesophageal tube tip terminates at the abdominal midline, likely within the gastric body with the side port distal to the GE junction. Stable abdominal bowel gas pattern. Layering right effusion with additional bandlike opacity likely reflecting some a atelectasis or scarring. Some additional interstitial and patchy opacity in the right base is again seen as well. Bowel gas pattern is unchanged from comparison prior. Multilevel degenerative changes are present in the imaged portions of the spine. Additional degenerative changes in the pelvis as visible asked. Atherosclerotic calcification of the abdominal aorta and splanchnic vasculature. IMPRESSION: Transesophageal tube tip terminates in the gastric body  with the side port distal to the GE junction. Layering right effusion with adjacent areas of bandlike and patchy opacity which could reflect a combination of atelectasis and airspace  disease. Electronically Signed   By: Kreg Shropshire M.D.   On: Sep 04, 2021 23:00   ECHOCARDIOGRAM COMPLETE  Result Date: 08/09/2021    ECHOCARDIOGRAM REPORT   Patient Name:   TANETTA FUHRIMAN Mehringer Date of Exam: 08/09/2021 Medical Rec #:  [id number]      Height:       63.0 in Accession #:    [id number]     Weight:       115.7 lb Date of Birth:  1941-06-03       BSA:          1.533 m Patient Age:    80 years       BP:           100/80 mmHg Patient Gender: F              HR:           100 bpm. Exam Location:  Inpatient Procedure: 2D Echo, Cardiac Doppler and Color Doppler Indications:    HYPOTENSION  History:        Patient has prior history of Echocardiogram examinations, most                 recent 11/28/2017. CHF, COPD and TIA, Arrythmias:Atrial                 Fibrillation, Signs/Symptoms:Dyspnea; Risk Factors:Diabetes.  Sonographer:    Neomia Dear RDCS Referring Phys: [id number] PAULA B SIMPSON IMPRESSIONS  1. Left ventricular ejection fraction, by estimation, is 45 to 50%. The left ventricle has mildly decreased function. The left ventricle demonstrates global hypokinesis. There is mild left ventricular hypertrophy. Left ventricular diastolic parameters are indeterminate. There is the interventricular septum is flattened in systole and diastole, consistent with right ventricular pressure and volume overload.  2. Right ventricular systolic function is mildly reduced. The right ventricular size is mildly enlarged. There is moderately elevated pulmonary artery systolic pressure. The estimated right ventricular systolic pressure is 55.1 mmHg.  3. Left atrial size was severely dilated.  4. Right atrial size was severely dilated.  5. Very eccentric mitral regurgitant jet. May represent more severe regurgitation than depicted. Consider TEE for further evaluation. The mitral valve is normal in structure. Moderate mitral valve regurgitation. No evidence of mitral stenosis. There is mild holosystolic prolapse of the middle scallop of  the posterior leaflet of the mitral valve.  6. Tricuspid valve regurgitation is severe.  7. The aortic valve is normal in structure. Aortic valve regurgitation is not visualized. No aortic stenosis is present.  8. Pulmonic valve regurgitation is moderate.  9. The inferior vena cava is normal in size with <50% respiratory variability, suggesting right atrial pressure of 8 mmHg. FINDINGS  Left Ventricle: Left ventricular ejection fraction, by estimation, is 45 to 50%. The left ventricle has mildly decreased function. The left ventricle demonstrates global hypokinesis. The left ventricular internal cavity size was normal in size. There is  mild left ventricular hypertrophy. The interventricular septum is flattened in systole and diastole, consistent with right ventricular pressure and volume overload. Left ventricular diastolic parameters are indeterminate. Right Ventricle: The right ventricular size is mildly enlarged. No increase in right ventricular wall thickness. Right ventricular systolic function is mildly reduced. There is moderately elevated pulmonary artery systolic pressure. The tricuspid regurgitant velocity is 3.43 m/s, and with an  assumed right atrial pressure of 8 mmHg, the estimated right ventricular systolic pressure is 55.1 mmHg. Left Atrium: Left atrial size was severely dilated. Right Atrium: Right atrial size was severely dilated. Pericardium: There is no evidence of pericardial effusion. Mitral Valve: Very eccentric mitral regurgitant jet. May represent more severe regurgitation than depicted. Consider TEE for further evaluation. The mitral valve is normal in structure. There is mild holosystolic prolapse of the middle scallop of the posterior leaflet of the mitral valve. There is mild thickening of the mitral valve leaflet(s). There is mild calcification of the mitral valve leaflet(s). Mild mitral annular calcification. Moderate mitral valve regurgitation, with eccentric medially directed jet. No  evidence of mitral valve stenosis. Tricuspid Valve: The tricuspid valve is normal in structure. Tricuspid valve regurgitation is severe. No evidence of tricuspid stenosis. Aortic Valve: The aortic valve is normal in structure. Aortic valve regurgitation is not visualized. No aortic stenosis is present. Aortic valve mean gradient measures 4.0 mmHg. Aortic valve peak gradient measures 7.1 mmHg. Aortic valve area, by VTI measures 1.22 cm. Pulmonic Valve: The pulmonic valve was normal in structure. Pulmonic valve regurgitation is moderate. No evidence of pulmonic stenosis. Aorta: The aortic root is normal in size and structure. Venous: The inferior vena cava is normal in size with less than 50% respiratory variability, suggesting right atrial pressure of 8 mmHg. IAS/Shunts: No atrial level shunt detected by color flow Doppler.  LEFT VENTRICLE PLAX 2D LVIDd:         3.60 cm LVIDs:         2.80 cm LV PW:         1.10 cm LV IVS:        1.20 cm LVOT diam:     1.90 cm LV SV:         26 LV SV Index:   17 LVOT Area:     2.84 cm  LV Volumes (MOD) LV vol d, MOD A2C: 37.1 ml LV vol d, MOD A4C: 37.9 ml LV vol s, MOD A2C: 22.4 ml LV vol s, MOD A4C: 21.3 ml LV SV MOD A2C:     14.7 ml LV SV MOD A4C:     37.9 ml LV SV MOD BP:      15.9 ml RIGHT VENTRICLE RV Basal diam:  4.05 cm RV Mid diam:    1.90 cm RV S prime:     9.36 cm/s LEFT ATRIUM             Index       RIGHT ATRIUM           Index LA diam:        4.20 cm 2.74 cm/m  RA Area:     18.50 cm LA Vol (A2C):   81.1 ml 52.92 ml/m RA Volume:   51.40 ml  33.54 ml/m LA Vol (A4C):   59.1 ml 38.56 ml/m LA Biplane Vol: 70.9 ml 46.26 ml/m  AORTIC VALVE                   PULMONIC VALVE AV Area (Vmax):    1.25 cm    PV Vmax:          0.83 m/s AV Area (Vmean):   1.36 cm    PV Vmean:         60.300 cm/s AV Area (VTI):     1.22 cm    PV VTI:           0.111 m AV  Vmax:           133.00 cm/s PV Peak grad:     2.8 mmHg AV Vmean:          92.700 cm/s PV Mean grad:     2.0 mmHg AV VTI:             0.215 m     PR End Diast Vel: 8.41 msec AV Peak Grad:      7.1 mmHg AV Mean Grad:      4.0 mmHg LVOT Vmax:         58.60 cm/s LVOT Vmean:        44.600 cm/s LVOT VTI:          0.093 m LVOT/AV VTI ratio: 0.43  AORTA Ao Root diam: 2.70 cm Ao Asc diam:  3.10 cm MR Peak grad: 67.6 mmHg   TRICUSPID VALVE MR Vmax:      411.00 cm/s TR Peak grad:   47.1 mmHg                           TR Vmax:        343.00 cm/s                            SHUNTS                           Systemic VTI:  0.09 m                           Systemic Diam: 1.90 cm Donato Schultz MD Electronically signed by Donato Schultz MD Signature Date/Time: 08/09/2021/12:16:54 PM    Final     Microbiology Recent Results (from the past 240 hour(s))  Resp Panel by RT-PCR (Flu A&B, Covid) Nasopharyngeal Swab     Status: None   Collection Time: 09-04-21 12:27 PM   Specimen: Nasopharyngeal Swab; Nasopharyngeal(NP) swabs in vial transport medium  Result Value Ref Range Status   SARS Coronavirus 2 by RT PCR NEGATIVE NEGATIVE Final    Comment: (NOTE) SARS-CoV-2 target nucleic acids are NOT DETECTED.  The SARS-CoV-2 RNA is generally detectable in upper respiratory specimens during the acute phase of infection. The lowest concentration of SARS-CoV-2 viral copies this assay can detect is 138 copies/mL. A negative result does not preclude SARS-Cov-2 infection and should not be used as the sole basis for treatment or other patient management decisions. A negative result may occur with  improper specimen collection/handling, submission of specimen other than nasopharyngeal swab, presence of viral mutation(s) within the areas targeted by this assay, and inadequate number of viral copies(<138 copies/mL). A negative result must be combined with clinical observations, patient history, and epidemiological information. The expected result is Negative.  Fact Sheet for Patients:  BloggerCourse.com  Fact Sheet for Healthcare  Providers:  SeriousBroker.it  This test is no t yet approved or cleared by the [place] FDA and  has been authorized for detection and/or diagnosis of SARS-CoV-2 by FDA under an Emergency Use Authorization (EUA). This EUA will remain  in effect (meaning this test can be used) for the duration of the COVID-19 declaration under Section 564(b)(1) of the Act, 21 U.S.C.section 360bbb-3(b)(1), unless the authorization is terminated  or revoked sooner.       Influenza A by PCR NEGATIVE NEGATIVE Final   Influenza B  by PCR NEGATIVE NEGATIVE Final    Comment: (NOTE) The Xpert Xpress SARS-CoV-2/FLU/RSV plus assay is intended as an aid in the diagnosis of influenza from Nasopharyngeal swab specimens and should not be used as a sole basis for treatment. Nasal washings and aspirates are unacceptable for Xpert Xpress SARS-CoV-2/FLU/RSV testing.  Fact Sheet for Patients: BloggerCourse.com  Fact Sheet for Healthcare Providers: SeriousBroker.it  This test is not yet approved or cleared by the [place] FDA and has been authorized for detection and/or diagnosis of SARS-CoV-2 by FDA under an Emergency Use Authorization (EUA). This EUA will remain in effect (meaning this test can be used) for the duration of the COVID-19 declaration under Section 564(b)(1) of the Act, 21 U.S.C. section 360bbb-3(b)(1), unless the authorization is terminated or revoked.  Performed at [facility], [address], [place], [place] [address]   Culture, blood (routine x 2)     Status: Abnormal   Collection Time: 28-Aug-2021 12:35 PM   Specimen: BLOOD  Result Value Ref Range Status   Specimen Description BLOOD RIGHT ANTECUBITAL  Final   Special Requests   Final    BOTTLES DRAWN AEROBIC AND ANAEROBIC Blood Culture adequate volume   Culture  Setup Time   Final    GRAM POSITIVE COCCI IN BOTH AEROBIC AND ANAEROBIC BOTTLES CRITICAL  VALUE NOTED.  VALUE IS CONSISTENT WITH PREVIOUSLY REPORTED AND CALLED VALUE.    Culture (A)  Final    STREPTOCOCCUS PNEUMONIAE SUSCEPTIBILITIES PERFORMED ON PREVIOUS CULTURE WITHIN THE LAST 5 DAYS. Performed at [facility], [address] [place], [place] [address]    Report Status 08/11/2021 FINAL  Final  Culture, blood (routine x 2)     Status: Abnormal   Collection Time: 08-28-21  1:02 PM   Specimen: Site Not Specified; Blood  Result Value Ref Range Status   Specimen Description SITE NOT SPECIFIED  Final   Special Requests   Final    BOTTLES DRAWN AEROBIC AND ANAEROBIC Blood Culture adequate volume   Culture  Setup Time   Final    GRAM POSITIVE COCCI IN CHAINS IN BOTH AEROBIC AND ANAEROBIC BOTTLES CRITICAL RESULT CALLED TO, READ BACK BY AND VERIFIED WITH: GREG ABBOTT PHARMD  08/09/21 EB Performed at [facility], [address], [place], [place] [address]    Culture STREPTOCOCCUS PNEUMONIAE (A)  Final   Report Status 08/11/2021 FINAL  Final   Organism ID, Bacteria STREPTOCOCCUS PNEUMONIAE  Final      Susceptibility   Streptococcus pneumoniae - MIC*    ERYTHROMYCIN <=0.12 SENSITIVE Sensitive     LEVOFLOXACIN 0.5 SENSITIVE Sensitive     VANCOMYCIN 0.25 SENSITIVE Sensitive     PENICILLIN (meningitis) <=0.06 SENSITIVE Sensitive     PENO - penicillin <=0.06      PENICILLIN (non-meningitis) <=0.06 SENSITIVE Sensitive     PENICILLIN (oral) <=0.06 SENSITIVE Sensitive     * STREPTOCOCCUS PNEUMONIAE  Blood Culture ID Panel (Reflexed)     Status: Abnormal   Collection Time: 08-28-2021  1:02 PM  Result Value Ref Range Status   Enterococcus faecalis NOT DETECTED NOT DETECTED Final   Enterococcus Faecium NOT DETECTED NOT DETECTED Final   Listeria monocytogenes NOT DETECTED NOT DETECTED Final   Staphylococcus species NOT DETECTED NOT DETECTED Final   Staphylococcus aureus (BCID) NOT DETECTED NOT DETECTED Final   Staphylococcus epidermidis NOT DETECTED NOT DETECTED Final    Staphylococcus lugdunensis NOT DETECTED NOT DETECTED Final   Streptococcus species DETECTED (A) NOT DETECTED  Final    Comment: CRITICAL RESULT CALLED TO, READ BACK BY AND VERIFIED WITH: GREG ABBOTT PHARMD @0340  08/09/21 EB    Streptococcus agalactiae NOT DETECTED NOT DETECTED Final   Streptococcus pneumoniae DETECTED (A) NOT DETECTED Final    Comment: CRITICAL RESULT CALLED TO, READ BACK BY AND VERIFIED WITH: GREG ABBOTT PHARMD @0340  08/09/21 EB    Streptococcus pyogenes NOT DETECTED NOT DETECTED Final   A.calcoaceticus-baumannii NOT DETECTED NOT DETECTED Final   Bacteroides fragilis NOT DETECTED NOT DETECTED Final   Enterobacterales NOT DETECTED NOT DETECTED Final   Enterobacter cloacae complex NOT DETECTED NOT DETECTED Final   Escherichia coli NOT DETECTED NOT DETECTED Final   Klebsiella aerogenes NOT DETECTED NOT DETECTED Final   Klebsiella oxytoca NOT DETECTED NOT DETECTED Final   Klebsiella pneumoniae NOT DETECTED NOT DETECTED Final   Proteus species NOT DETECTED NOT DETECTED Final   Salmonella species NOT DETECTED NOT DETECTED Final   Serratia marcescens NOT DETECTED NOT DETECTED Final   Haemophilus influenzae NOT DETECTED NOT DETECTED Final   Neisseria meningitidis NOT DETECTED NOT DETECTED Final   Pseudomonas aeruginosa NOT DETECTED NOT DETECTED Final   Stenotrophomonas maltophilia NOT DETECTED NOT DETECTED Final   Candida albicans NOT DETECTED NOT DETECTED Final   Candida auris NOT DETECTED NOT DETECTED Final   Candida glabrata NOT DETECTED NOT DETECTED Final   Candida krusei NOT DETECTED NOT DETECTED Final   Candida parapsilosis NOT DETECTED NOT DETECTED Final   Candida tropicalis NOT DETECTED NOT DETECTED Final   Cryptococcus neoformans/gattii NOT DETECTED NOT DETECTED Final    Comment: Performed at [facility], [address], [place], [address] [place]  Urine Culture     Status: None   Collection Time: 08/07/2021  5:25 PM   Specimen: Urine, Clean Catch  Result  Value Ref Range Status   Specimen Description URINE, CLEAN CATCH  Final   Special Requests NONE  Final   Culture   Final    NO GROWTH Performed at [facility], [address] [place], [address] [place]    Report Status 08/10/2021 FINAL  Final  MRSA Next Gen by PCR, Nasal     Status: None   Collection Time: 08/28/2021  5:25 PM  Result Value Ref Range Status   MRSA by PCR Next Gen NOT DETECTED NOT DETECTED Final    Comment: (NOTE) The GeneXpert MRSA Assay (FDA approved for NASAL specimens only), is one component of a comprehensive MRSA colonization surveillance program. It is not intended to diagnose MRSA infection nor to guide or monitor treatment for MRSA infections. Test performance is not FDA approved in patients less than 42 years old. Performed at [facility], [address] [place], [address] [place]   Culture, Respiratory w Gram Stain (tracheal aspirate)     Status: None   Collection Time: 08/26/2021  9:52 PM   Specimen: Tracheal Aspirate; Respiratory  Result Value Ref Range Status   Specimen Description TRACHEAL ASPIRATE  Final   Special Requests NONE  Final   Gram Stain   Final    RARE WBC PRESENT, PREDOMINANTLY PMN ABUNDANT GRAM POSITIVE COCCI RARE YEAST Performed at [facility], [address] [place], [address] [place]    Culture   Final    RARE PSEUDOMONAS AERUGINOSA MODERATE STREPTOCOCCUS PNEUMONIAE FEW CANDIDA TROPICALIS    Report Status 08/11/2021 FINAL  Final   Organism ID, Bacteria PSEUDOMONAS AERUGINOSA  Final   Organism ID, Bacteria STREPTOCOCCUS PNEUMONIAE  Final  Susceptibility   Pseudomonas aeruginosa - MIC*    CEFTAZIDIME 4 SENSITIVE Sensitive     CIPROFLOXACIN <=0.25 SENSITIVE Sensitive     GENTAMICIN <=1 SENSITIVE Sensitive     IMIPENEM 2 SENSITIVE Sensitive     PIP/TAZO 8 SENSITIVE Sensitive     CEFEPIME 2 SENSITIVE Sensitive     * RARE PSEUDOMONAS AERUGINOSA   Streptococcus pneumoniae - MIC*    ERYTHROMYCIN <=0.12  SENSITIVE Sensitive     LEVOFLOXACIN 0.5 SENSITIVE Sensitive     VANCOMYCIN <=0.12 SENSITIVE Sensitive     PENICILLIN (meningitis) <=0.06 SENSITIVE Sensitive     PENO - penicillin <=0.06      PENICILLIN (non-meningitis) <=0.06 SENSITIVE Sensitive     PENICILLIN (oral) <=0.06 SENSITIVE Sensitive     CEFTRIAXONE (non-meningitis) <=0.12 SENSITIVE Sensitive     CEFTRIAXONE (meningitis) <=0.12 SENSITIVE Sensitive     * MODERATE STREPTOCOCCUS PNEUMONIAE    Lab Basic Metabolic Panel: Recent Labs  Lab 2021-01-15 1257 2021-01-15 1311 2021-01-15 1336 2021-01-15 1440 2021-01-15 1618 2021-01-15 1737 08/09/21 0141 08/09/21 1854 08/10/21 0544 08/11/21 0103  NA 138   < > 138   < > 138 137 137  --  136 137  K 5.6*   < > 5.3*   < > 4.4 4.6 4.7  --  3.8 4.1  CL 95*  --  98  --   --  98 100  --  101 102  CO2 33*  --   --   --   --  30 25  --  25 26  GLUCOSE 57*  --  54*  --   --  159* 136*  --  223* 205*  BUN 49*  --  51*  --   --  49* 52*  --  66* 74*  CREATININE 2.06*  --  2.10*  --   --  1.98* 2.04*  --  1.84* 1.92*  CALCIUM 8.6*  --   --   --   --  8.0* 8.1*  --  8.1* 8.1*  MG  --   --   --   --   --  1.7 1.5* 2.6* 2.5*  --   PHOS  --   --   --   --   --  4.8* 4.4 2.9 2.4* 2.2*   < > = values in this interval not displayed.   Liver Function Tests: Recent Labs  Lab 2021-01-15 1257 08/09/21 0141 08/10/21 0544 08/11/21 0103  AST 85* 375* 107* 87*  ALT 39 140* 94* 88*  ALKPHOS 68 56 60 69  BILITOT 1.3* 1.1 0.2* 0.6  PROT 6.3* 5.6* 4.6* 4.9*  ALBUMIN 2.7* 2.2* 1.8* 1.7*   No results for input(s): LIPASE, AMYLASE in the last 168 hours. No results for input(s): AMMONIA in the last 168 hours. CBC: Recent Labs  Lab 2021-01-15 1257 2021-01-15 1311 2021-01-15 1440 2021-01-15 1618 08/09/21 0141 08/10/21 0544 08/11/21 0103  WBC 24.3*  --   --   --  16.4* 18.7* 17.5*  NEUTROABS 21.5*  --   --   --   --  17.8*  --   HGB 12.1   < > 11.9* 10.5* 11.3* 9.8* 9.6*  HCT 40.7   < > 35.0* 31.0* 38.1 30.9*  30.9*  MCV 103.3*  --   --   --  101.9* 96.9 95.7  PLT 211  --   --   --  188 157 146*   < > = values in this interval not  displayed.   Cardiac Enzymes: No results for input(s): CKTOTAL, CKMB, CKMBINDEX, TROPONINI in the last 168 hours. Sepsis Labs: Recent Labs  Lab 08/26/2021 1257 08/11/2021 1320 08/06/2021 1737 08/14/2021 2200 08/09/21 0141 08/09/21 0423 08/10/21 0544 08/11/21 0103  PROCALCITON  --   --  13.68  --   --   --   --   --   WBC 24.3*  --   --   --  16.4*  --  18.7* 17.5*  LATICACIDVEN  --    < > 2.2* 4.0* 4.0* 3.7*  --   --    < > = values in this interval not displayed.    Procedures/Operations     [employer] September 11, 2021, 11:56 AM

## 2021-08-29 DEATH — deceased
